# Patient Record
Sex: Male | Born: 1945 | Race: White | Hispanic: No | Marital: Married | Smoking: Former smoker
Health system: Southern US, Community
[De-identification: ages and names within clinical notes are randomized; demographics above are authoritative.]

## PROBLEM LIST (undated history)

## (undated) DIAGNOSIS — E1142 Type 2 diabetes mellitus with diabetic polyneuropathy: Secondary | ICD-10-CM

## (undated) DIAGNOSIS — G4730 Sleep apnea, unspecified: Secondary | ICD-10-CM

## (undated) DIAGNOSIS — I639 Cerebral infarction, unspecified: Secondary | ICD-10-CM

## (undated) DIAGNOSIS — I6930 Unspecified sequelae of cerebral infarction: Secondary | ICD-10-CM

## (undated) DIAGNOSIS — E1165 Type 2 diabetes mellitus with hyperglycemia: Secondary | ICD-10-CM

## (undated) DIAGNOSIS — I10 Essential (primary) hypertension: Secondary | ICD-10-CM

## (undated) HISTORY — PX: COLONOSCOPY: SHX174

## (undated) HISTORY — DX: Essential (primary) hypertension: I10

## (undated) HISTORY — PX: HEEL SPUR SURGERY: SHX665

## (undated) HISTORY — PX: HERNIA REPAIR: SHX51

## (undated) HISTORY — PX: ANKLE SURGERY: SHX546

## (undated) HISTORY — DX: Sleep apnea, unspecified: G47.30

## (undated) HISTORY — PX: OTHER SURGICAL HISTORY: SHX169

---

## 1997-07-19 ENCOUNTER — Encounter: Payer: Self-pay | Admitting: Pulmonary Disease

## 1997-09-21 ENCOUNTER — Encounter: Payer: Self-pay | Admitting: Pulmonary Disease

## 1999-05-06 ENCOUNTER — Other Ambulatory Visit: Admission: RE | Admit: 1999-05-06 | Discharge: 1999-05-06 | Payer: Self-pay | Admitting: Internal Medicine

## 1999-05-06 ENCOUNTER — Encounter (INDEPENDENT_AMBULATORY_CARE_PROVIDER_SITE_OTHER): Payer: Self-pay | Admitting: Specialist

## 2000-12-03 ENCOUNTER — Encounter (INDEPENDENT_AMBULATORY_CARE_PROVIDER_SITE_OTHER): Payer: Self-pay | Admitting: Specialist

## 2000-12-03 ENCOUNTER — Ambulatory Visit (HOSPITAL_BASED_OUTPATIENT_CLINIC_OR_DEPARTMENT_OTHER): Admission: RE | Admit: 2000-12-03 | Discharge: 2000-12-03 | Payer: Self-pay | Admitting: Surgery

## 2001-11-29 ENCOUNTER — Encounter: Payer: Self-pay | Admitting: Pulmonary Disease

## 2001-11-29 ENCOUNTER — Ambulatory Visit (HOSPITAL_BASED_OUTPATIENT_CLINIC_OR_DEPARTMENT_OTHER): Admission: RE | Admit: 2001-11-29 | Discharge: 2001-11-29 | Payer: Self-pay | Admitting: Pulmonary Disease

## 2003-08-20 ENCOUNTER — Encounter: Admission: RE | Admit: 2003-08-20 | Discharge: 2003-11-18 | Payer: Self-pay | Admitting: Family Medicine

## 2004-01-17 ENCOUNTER — Encounter: Admission: RE | Admit: 2004-01-17 | Discharge: 2004-04-16 | Payer: Self-pay | Admitting: Family Medicine

## 2004-08-28 ENCOUNTER — Ambulatory Visit: Payer: Self-pay | Admitting: Family Medicine

## 2004-09-08 ENCOUNTER — Ambulatory Visit: Payer: Self-pay | Admitting: Family Medicine

## 2005-02-27 ENCOUNTER — Ambulatory Visit: Payer: Self-pay | Admitting: Family Medicine

## 2005-03-10 ENCOUNTER — Ambulatory Visit: Payer: Self-pay | Admitting: Family Medicine

## 2005-09-18 ENCOUNTER — Ambulatory Visit: Payer: Self-pay | Admitting: Family Medicine

## 2005-09-23 ENCOUNTER — Ambulatory Visit: Payer: Self-pay | Admitting: Family Medicine

## 2005-10-08 ENCOUNTER — Ambulatory Visit: Payer: Self-pay | Admitting: Family Medicine

## 2005-10-27 ENCOUNTER — Ambulatory Visit: Payer: Self-pay | Admitting: Family Medicine

## 2005-11-10 ENCOUNTER — Ambulatory Visit: Payer: Self-pay | Admitting: Family Medicine

## 2005-11-24 ENCOUNTER — Ambulatory Visit: Payer: Self-pay | Admitting: Family Medicine

## 2005-12-15 ENCOUNTER — Ambulatory Visit: Payer: Self-pay | Admitting: Family Medicine

## 2006-03-02 ENCOUNTER — Ambulatory Visit: Payer: Self-pay | Admitting: Family Medicine

## 2006-08-05 ENCOUNTER — Ambulatory Visit: Payer: Self-pay | Admitting: Pulmonary Disease

## 2006-08-16 ENCOUNTER — Ambulatory Visit: Payer: Self-pay | Admitting: Family Medicine

## 2006-10-05 ENCOUNTER — Ambulatory Visit: Payer: Self-pay | Admitting: Family Medicine

## 2006-10-05 LAB — CONVERTED CEMR LAB
ALT: 23 U/L
AST: 21 U/L
Albumin: 3.6 g/dL
Alkaline Phosphatase: 48 U/L
BUN: 11 mg/dL
Basophils Absolute: 0 10*3/uL
Basophils Relative: 0.5 %
CO2: 26 meq/L
Calcium: 8.9 mg/dL
Chloride: 105 meq/L
Chol/HDL Ratio, serum: 4.7
Cholesterol: 116 mg/dL
Creatinine, Ser: 1 mg/dL
Creatinine,U: 182.8 mg/dL
Eosinophil percent: 2.5 %
GFR calc non Af Amer: 81 mL/min
Glomerular Filtration Rate, Af Am: 98 mL/min/{1.73_m2}
Glucose, Bld: 141 mg/dL — ABNORMAL HIGH
HCT: 43 %
HDL: 24.6 mg/dL — ABNORMAL LOW
Hemoglobin: 14.8 g/dL
Hgb A1c MFr Bld: 7 % — ABNORMAL HIGH
LDL DIRECT: 55.6 mg/dL
Lymphocytes Relative: 23.6 %
MCHC: 34.5 g/dL
MCV: 88 fL
Microalb Creat Ratio: 4.4 mg/g
Microalb, Ur: 0.8 mg/dL
Monocytes Absolute: 0.6 10*3/uL
Monocytes Relative: 7.7 %
Neutro Abs: 5.6 10*3/uL
Neutrophils Relative %: 65.7 %
PSA: 0.64 ng/mL
Platelets: 218 10*3/uL
Potassium: 3.8 meq/L
RBC: 4.89 M/uL
RDW: 12.1 %
Sodium: 137 meq/L
TSH: 1.02 u[IU]/mL
Total Bilirubin: 0.8 mg/dL
Total Protein: 6.8 g/dL
Triglyceride fasting, serum: 246 mg/dL
VLDL: 49 mg/dL — ABNORMAL HIGH
WBC: 8.4 10*3/uL

## 2006-10-12 ENCOUNTER — Ambulatory Visit: Payer: Self-pay | Admitting: Family Medicine

## 2006-12-02 ENCOUNTER — Ambulatory Visit (HOSPITAL_BASED_OUTPATIENT_CLINIC_OR_DEPARTMENT_OTHER): Admission: RE | Admit: 2006-12-02 | Discharge: 2006-12-02 | Payer: Self-pay | Admitting: Surgery

## 2007-02-08 ENCOUNTER — Ambulatory Visit: Payer: Self-pay | Admitting: Family Medicine

## 2007-02-08 LAB — CONVERTED CEMR LAB
Cholesterol: 127 mg/dL (ref 0–200)
Direct LDL: 59.5 mg/dL
Hgb A1c MFr Bld: 6.1 % — ABNORMAL HIGH (ref 4.6–6.0)
VLDL: 76 mg/dL — ABNORMAL HIGH (ref 0–40)

## 2007-06-15 DIAGNOSIS — I10 Essential (primary) hypertension: Secondary | ICD-10-CM

## 2007-06-15 DIAGNOSIS — E139 Other specified diabetes mellitus without complications: Secondary | ICD-10-CM | POA: Insufficient documentation

## 2007-07-27 ENCOUNTER — Ambulatory Visit: Payer: Self-pay | Admitting: Pulmonary Disease

## 2007-07-27 DIAGNOSIS — G4733 Obstructive sleep apnea (adult) (pediatric): Secondary | ICD-10-CM

## 2007-08-31 ENCOUNTER — Ambulatory Visit: Payer: Self-pay | Admitting: Family Medicine

## 2007-10-18 ENCOUNTER — Ambulatory Visit: Payer: Self-pay | Admitting: Family Medicine

## 2007-10-18 LAB — CONVERTED CEMR LAB
Albumin: 3.9 g/dL (ref 3.5–5.2)
Basophils Absolute: 0.1 10*3/uL (ref 0.0–0.1)
Creatinine, Ser: 1 mg/dL (ref 0.4–1.5)
Creatinine,U: 253 mg/dL
Eosinophils Absolute: 0.2 10*3/uL (ref 0.0–0.6)
GFR calc non Af Amer: 80 mL/min
Glucose, Urine, Semiquant: NEGATIVE
HCT: 44.3 % (ref 39.0–52.0)
Hemoglobin: 15.1 g/dL (ref 13.0–17.0)
Lymphocytes Relative: 28.2 % (ref 12.0–46.0)
MCHC: 34.1 g/dL (ref 30.0–36.0)
MCV: 89.7 fL (ref 78.0–100.0)
Microalb, Ur: 0.8 mg/dL (ref 0.0–1.9)
Monocytes Absolute: 0.8 10*3/uL — ABNORMAL HIGH (ref 0.2–0.7)
Neutrophils Relative %: 58.1 % (ref 43.0–77.0)
PSA: 0.39 ng/mL (ref 0.10–4.00)
Potassium: 4.6 meq/L (ref 3.5–5.1)
Sodium: 138 meq/L (ref 135–145)
Specific Gravity, Urine: >=1.03
Total Bilirubin: 0.8 mg/dL (ref 0.3–1.2)
WBC Urine, dipstick: NEGATIVE
pH: 5

## 2007-10-25 ENCOUNTER — Ambulatory Visit: Payer: Self-pay | Admitting: Family Medicine

## 2007-10-26 ENCOUNTER — Encounter: Payer: Self-pay | Admitting: Family Medicine

## 2007-12-13 ENCOUNTER — Ambulatory Visit: Payer: Self-pay | Admitting: Family Medicine

## 2008-02-10 ENCOUNTER — Ambulatory Visit: Payer: Self-pay | Admitting: Family Medicine

## 2008-02-10 DIAGNOSIS — J069 Acute upper respiratory infection, unspecified: Secondary | ICD-10-CM | POA: Insufficient documentation

## 2008-03-15 ENCOUNTER — Ambulatory Visit: Payer: Self-pay | Admitting: Family Medicine

## 2008-03-15 DIAGNOSIS — E785 Hyperlipidemia, unspecified: Secondary | ICD-10-CM

## 2008-03-15 LAB — CONVERTED CEMR LAB
CO2: 26 meq/L (ref 19–32)
Calcium: 9.3 mg/dL (ref 8.4–10.5)
Direct LDL: 66.1 mg/dL
GFR calc Af Amer: 79 mL/min
GFR calc non Af Amer: 65 mL/min
Hgb A1c MFr Bld: 7.6 % — ABNORMAL HIGH (ref 4.6–6.0)
Sodium: 134 meq/L — ABNORMAL LOW (ref 135–145)
Total CHOL/HDL Ratio: 8.7
Triglycerides: 231 mg/dL (ref 0–149)
VLDL: 46 mg/dL — ABNORMAL HIGH (ref 0–40)

## 2008-03-16 ENCOUNTER — Ambulatory Visit: Payer: Self-pay | Admitting: Family Medicine

## 2008-04-02 ENCOUNTER — Telehealth: Payer: Self-pay | Admitting: Family Medicine

## 2008-07-17 ENCOUNTER — Telehealth: Payer: Self-pay | Admitting: Family Medicine

## 2008-07-23 ENCOUNTER — Ambulatory Visit: Payer: Self-pay | Admitting: Family Medicine

## 2008-07-23 LAB — CONVERTED CEMR LAB
Calcium: 8.8 mg/dL (ref 8.4–10.5)
Chloride: 109 meq/L (ref 96–112)
Creatinine, Ser: 1 mg/dL (ref 0.4–1.5)
GFR calc Af Amer: 97 mL/min
GFR calc non Af Amer: 80 mL/min
Hgb A1c MFr Bld: 6.6 % — ABNORMAL HIGH (ref 4.6–6.0)

## 2008-07-25 ENCOUNTER — Ambulatory Visit: Payer: Self-pay | Admitting: Pulmonary Disease

## 2008-08-02 ENCOUNTER — Ambulatory Visit: Payer: Self-pay | Admitting: Family Medicine

## 2008-11-01 ENCOUNTER — Ambulatory Visit: Payer: Self-pay | Admitting: Family Medicine

## 2008-11-01 LAB — CONVERTED CEMR LAB
ALT: 31 units/L (ref 0–53)
AST: 24 units/L (ref 0–37)
Alkaline Phosphatase: 39 units/L (ref 39–117)
BUN: 12 mg/dL (ref 6–23)
Basophils Relative: 0.3 % (ref 0.0–3.0)
Chloride: 106 meq/L (ref 96–112)
Creatinine, Ser: 1 mg/dL (ref 0.4–1.5)
Direct LDL: 52.8 mg/dL
Eosinophils Absolute: 0.2 10*3/uL (ref 0.0–0.7)
Eosinophils Relative: 3.9 % (ref 0.0–5.0)
GFR calc non Af Amer: 80 mL/min
Glucose, Bld: 144 mg/dL — ABNORMAL HIGH (ref 70–99)
HCT: 41.5 % (ref 39.0–52.0)
MCV: 88.6 fL (ref 78.0–100.0)
Microalb Creat Ratio: 3.2 mg/g (ref 0.0–30.0)
Microalb, Ur: 0.6 mg/dL (ref 0.0–1.9)
Monocytes Absolute: 0.6 10*3/uL (ref 0.1–1.0)
Monocytes Relative: 9 % (ref 3.0–12.0)
Neutrophils Relative %: 60.7 % (ref 43.0–77.0)
Nitrite: NEGATIVE
PSA: 0.34 ng/mL (ref 0.10–4.00)
RBC: 4.69 M/uL (ref 4.22–5.81)
Specific Gravity, Urine: >=1.03
Total CHOL/HDL Ratio: 6.9
Urobilinogen, UA: 0.2
WBC Urine, dipstick: NEGATIVE
WBC: 6.4 10*3/uL (ref 4.5–10.5)

## 2008-11-06 ENCOUNTER — Ambulatory Visit: Payer: Self-pay | Admitting: Family Medicine

## 2008-11-08 ENCOUNTER — Telehealth: Payer: Self-pay | Admitting: Family Medicine

## 2008-12-07 ENCOUNTER — Ambulatory Visit: Payer: Self-pay | Admitting: Internal Medicine

## 2008-12-21 ENCOUNTER — Encounter: Payer: Self-pay | Admitting: Internal Medicine

## 2008-12-21 ENCOUNTER — Ambulatory Visit: Payer: Self-pay | Admitting: Internal Medicine

## 2008-12-24 ENCOUNTER — Encounter: Payer: Self-pay | Admitting: Internal Medicine

## 2009-01-14 ENCOUNTER — Telehealth: Payer: Self-pay | Admitting: *Deleted

## 2009-02-11 ENCOUNTER — Ambulatory Visit: Payer: Self-pay | Admitting: Family Medicine

## 2009-02-11 LAB — CONVERTED CEMR LAB
Hgb A1c MFr Bld: 7.6 % — ABNORMAL HIGH (ref 4.6–6.5)
Total CHOL/HDL Ratio: 8

## 2009-02-18 ENCOUNTER — Ambulatory Visit: Payer: Self-pay | Admitting: Family Medicine

## 2009-07-15 ENCOUNTER — Ambulatory Visit: Payer: Self-pay | Admitting: Family Medicine

## 2009-07-15 LAB — CONVERTED CEMR LAB
Chloride: 111 meq/L (ref 96–112)
Hgb A1c MFr Bld: 5.7 % (ref 4.6–6.5)
Potassium: 3.8 meq/L (ref 3.5–5.1)

## 2009-07-22 ENCOUNTER — Ambulatory Visit: Payer: Self-pay | Admitting: Family Medicine

## 2009-07-23 ENCOUNTER — Telehealth (INDEPENDENT_AMBULATORY_CARE_PROVIDER_SITE_OTHER): Payer: Self-pay | Admitting: *Deleted

## 2009-07-25 ENCOUNTER — Ambulatory Visit: Payer: Self-pay | Admitting: Pulmonary Disease

## 2009-11-07 ENCOUNTER — Ambulatory Visit: Payer: Self-pay | Admitting: Family Medicine

## 2009-11-07 LAB — CONVERTED CEMR LAB
ALT: 21 units/L (ref 0–53)
Albumin: 3.7 g/dL (ref 3.5–5.2)
Alkaline Phosphatase: 26 units/L — ABNORMAL LOW (ref 39–117)
Basophils Absolute: 0 10*3/uL (ref 0.0–0.1)
Blood in Urine, dipstick: NEGATIVE
CO2: 26 meq/L (ref 19–32)
Calcium: 9.1 mg/dL (ref 8.4–10.5)
Creatinine,U: 176.4 mg/dL
Eosinophils Absolute: 0.2 10*3/uL (ref 0.0–0.7)
Glucose, Bld: 106 mg/dL — ABNORMAL HIGH (ref 70–99)
Glucose, Urine, Semiquant: NEGATIVE
HDL: 20.6 mg/dL — ABNORMAL LOW (ref >39.00)
Hemoglobin: 13.7 g/dL (ref 13.0–17.0)
Hgb A1c MFr Bld: 5.7 % (ref 4.6–6.5)
Ketones, urine, test strip: NEGATIVE
Lymphocytes Relative: 26.4 % (ref 12.0–46.0)
Lymphs Abs: 1.5 10*3/uL (ref 0.7–4.0)
MCHC: 33.6 g/dL (ref 30.0–36.0)
MCV: 94.2 fL (ref 78.0–100.0)
Microalb, Ur: 0.5 mg/dL (ref 0.0–1.9)
Monocytes Absolute: 0.5 10*3/uL (ref 0.1–1.0)
Neutro Abs: 3.5 10*3/uL (ref 1.4–7.7)
RDW: 13.3 % (ref 11.5–14.6)
Sodium: 136 meq/L (ref 135–145)
Specific Gravity, Urine: 1.025
TSH: 1.51 microintl units/mL (ref 0.35–5.50)
Triglycerides: 134 mg/dL (ref 0.0–149.0)
WBC Urine, dipstick: NEGATIVE
pH: 5

## 2009-11-14 ENCOUNTER — Ambulatory Visit: Payer: Self-pay | Admitting: Family Medicine

## 2009-11-14 ENCOUNTER — Encounter: Payer: Self-pay | Admitting: Pulmonary Disease

## 2009-12-02 ENCOUNTER — Encounter: Payer: Self-pay | Admitting: Family Medicine

## 2010-04-07 ENCOUNTER — Ambulatory Visit: Payer: Self-pay | Admitting: Family Medicine

## 2010-04-07 ENCOUNTER — Telehealth: Payer: Self-pay | Admitting: Family Medicine

## 2010-04-07 DIAGNOSIS — J45901 Unspecified asthma with (acute) exacerbation: Secondary | ICD-10-CM | POA: Insufficient documentation

## 2010-04-10 ENCOUNTER — Ambulatory Visit: Payer: Self-pay | Admitting: Family Medicine

## 2010-05-07 ENCOUNTER — Ambulatory Visit: Payer: Self-pay | Admitting: Family Medicine

## 2010-05-13 LAB — CONVERTED CEMR LAB
BUN: 18 mg/dL (ref 6–23)
CO2: 26 meq/L (ref 19–32)
Calcium: 9.1 mg/dL (ref 8.4–10.5)
Creatinine, Ser: 1.2 mg/dL (ref 0.4–1.5)
GFR calc non Af Amer: 67.11 mL/min (ref >60)
Glucose, Bld: 108 mg/dL — ABNORMAL HIGH (ref 70–99)

## 2010-07-29 ENCOUNTER — Ambulatory Visit: Payer: Self-pay | Admitting: Pulmonary Disease

## 2010-08-07 ENCOUNTER — Encounter: Admission: RE | Admit: 2010-08-07 | Discharge: 2010-08-07 | Payer: Self-pay | Admitting: Sports Medicine

## 2010-09-11 ENCOUNTER — Telehealth (INDEPENDENT_AMBULATORY_CARE_PROVIDER_SITE_OTHER): Payer: Self-pay | Admitting: *Deleted

## 2010-09-23 ENCOUNTER — Telehealth (INDEPENDENT_AMBULATORY_CARE_PROVIDER_SITE_OTHER): Payer: Self-pay | Admitting: *Deleted

## 2010-10-31 ENCOUNTER — Encounter: Payer: Self-pay | Admitting: Pulmonary Disease

## 2010-11-10 ENCOUNTER — Other Ambulatory Visit: Payer: Self-pay | Admitting: Family Medicine

## 2010-11-10 ENCOUNTER — Ambulatory Visit
Admission: RE | Admit: 2010-11-10 | Discharge: 2010-11-10 | Payer: Self-pay | Source: Home / Self Care | Attending: Family Medicine | Admitting: Family Medicine

## 2010-11-10 LAB — BASIC METABOLIC PANEL
BUN: 20 mg/dL (ref 6–23)
CO2: 25 mEq/L (ref 19–32)
Calcium: 9.2 mg/dL (ref 8.4–10.5)
Chloride: 102 mEq/L (ref 96–112)
Creatinine, Ser: 1.2 mg/dL (ref 0.4–1.5)
GFR: 64.44 mL/min (ref >60.00)
Glucose, Bld: 76 mg/dL (ref 70–99)
Potassium: 4.2 mEq/L (ref 3.5–5.1)
Sodium: 134 mEq/L — ABNORMAL LOW (ref 135–145)

## 2010-11-10 LAB — CBC WITH DIFFERENTIAL/PLATELET
Basophils Absolute: 0 10*3/uL (ref 0.0–0.1)
Basophils Relative: 0.3 % (ref 0.0–3.0)
Eosinophils Absolute: 0.3 10*3/uL (ref 0.0–0.7)
Eosinophils Relative: 3.8 % (ref 0.0–5.0)
HCT: 43 % (ref 39.0–52.0)
Hemoglobin: 14.6 g/dL (ref 13.0–17.0)
Lymphocytes Relative: 26.2 % (ref 12.0–46.0)
Lymphs Abs: 1.9 10*3/uL (ref 0.7–4.0)
MCHC: 33.9 g/dL (ref 30.0–36.0)
MCV: 91.7 fl (ref 78.0–100.0)
Monocytes Absolute: 0.8 10*3/uL (ref 0.1–1.0)
Monocytes Relative: 10.3 % (ref 3.0–12.0)
Neutro Abs: 4.4 10*3/uL (ref 1.4–7.7)
Neutrophils Relative %: 59.4 % (ref 43.0–77.0)
Platelets: 181 10*3/uL (ref 150.0–400.0)
RBC: 4.69 Mil/uL (ref 4.22–5.81)
RDW: 14.3 % (ref 11.5–14.6)
WBC: 7.4 10*3/uL (ref 4.5–10.5)

## 2010-11-10 LAB — URINALYSIS
Bilirubin Urine: NEGATIVE
Hemoglobin, Urine: NEGATIVE
Ketones, ur: NEGATIVE
Leukocytes, UA: NEGATIVE
Nitrite: NEGATIVE
Specific Gravity, Urine: 1.025 (ref 1.000–1.030)
Total Protein, Urine: NEGATIVE
Urine Glucose: NEGATIVE
Urobilinogen, UA: 0.2 (ref 0.0–1.0)
pH: 5 (ref 5.0–8.0)

## 2010-11-10 LAB — LIPID PANEL
Cholesterol: 146 mg/dL (ref 0–200)
HDL: 22.8 mg/dL — ABNORMAL LOW (ref >39.00)
LDL Cholesterol: 97 mg/dL (ref 0–99)
Total CHOL/HDL Ratio: 6
Triglycerides: 130 mg/dL (ref 0.0–149.0)
VLDL: 26 mg/dL (ref 0.0–40.0)

## 2010-11-10 LAB — HEPATIC FUNCTION PANEL
ALT: 23 U/L (ref 0–53)
AST: 25 U/L (ref 0–37)
Albumin: 3.8 g/dL (ref 3.5–5.2)
Alkaline Phosphatase: 30 U/L — ABNORMAL LOW (ref 39–117)
Bilirubin, Direct: 0.2 mg/dL (ref 0.0–0.3)
Total Bilirubin: 0.9 mg/dL (ref 0.3–1.2)
Total Protein: 6.6 g/dL (ref 6.0–8.3)

## 2010-11-10 LAB — TSH: TSH: 1.31 u[IU]/mL (ref 0.35–5.50)

## 2010-11-10 LAB — PSA: PSA: 0.48 ng/mL (ref 0.10–4.00)

## 2010-11-25 ENCOUNTER — Encounter: Payer: Self-pay | Admitting: Family Medicine

## 2010-11-25 ENCOUNTER — Ambulatory Visit
Admission: RE | Admit: 2010-11-25 | Discharge: 2010-11-25 | Payer: Self-pay | Source: Home / Self Care | Attending: Family Medicine | Admitting: Family Medicine

## 2010-11-25 DIAGNOSIS — E1149 Type 2 diabetes mellitus with other diabetic neurological complication: Secondary | ICD-10-CM | POA: Insufficient documentation

## 2010-11-25 NOTE — Progress Notes (Signed)
Summary: cpap supplies  Phone Note From Pharmacy   Caller: stephanie w/ [facility] Call For: clance  Summary of Call: caller is re-faxing request for cpap supplies- says she faxed this previously on 09/03/10. stephanie [phone] x [phone] Initial call taken by: Tivis Ringer, CNA,  September 11, 2010 4:47 PM  Follow-up for Phone Call        left msg for Judeth Cornfield we do have cn request and given to Dr Shelle Iron  for signature.  PT WAS SEEN 07/29/10 AND NOT DUE FOR NEXT OV UNTIL OCTOBER 2012.  As soon as we get signed bu Dr Shelle Iron will fax to [facility] .Kandice Hams [facility]  September 12, 2010 9:15 AM  Follow-up by: Kandice Hams CMA,  September 12, 2010 9:15 AM

## 2010-11-25 NOTE — Letter (Signed)
Summary: LMN for CPAP Supplies/[facility]  LMN for CPAP Supplies/[facility]   Imported By: Sherian Rein 11/21/2009 08:14:32  _____________________________________________________________________  External Attachment:    Type:   Image     Comment:   External Document

## 2010-11-25 NOTE — Assessment & Plan Note (Signed)
Summary: rov for osa   Primary Melyssa Signor/Referring Adessa Primiano:  Tawanna Cooler  CC:  Annual followup on OSA.  Pt states that he is doing well with CPAP- sleeps well up to 6-7 hrs per night on average.  Denies any complaints regarding mask or pressure.[name]  History of Present Illness: the pt comes in today for f/u of his known osa.  He has been wearing cpap compliantly, and feels he is sleeping well with adequate daytime alertness.  He is having no issues with his mask or pressure tolerance.  His weight is up about 8 pounds since the last visit here.  Current Medications (verified): 1)  Hydrocortisone Valerate 0.2 % Crea (Hydrocortisone Valerate) .... Uad 2)  Onetouch Ultra Test  Strp (Glucose Blood) .... Uad 3)  Adult Aspirin Low Strength 81 Mg  Tbdp (Aspirin) .... Take 1 Tablet By Mouth Once A Day 4)  Glipizide Xl 10 Mg  Tb24 (Glipizide) .... Take 1 Tablet By Mouth Two Times A Day 5)  Fenofibrate Micronized 200 Mg Caps (Fenofibrate Micronized) .... Take One Tab Once Daily 6)  Actos 30 Mg Tabs (Pioglitazone Hcl) .... Take One Tab Once Daily 7)  Zestril 20 Mg Tabs (Lisinopril) .... Take 1 Tablet By Mouth Every Morning  Allergies (verified): 1)  ! Oxycodone-Acetaminophen (Oxycodone-Acetaminophen)  Past History:  Past medical, surgical, family and social histories (including risk factors) reviewed, and no changes noted (except as noted below).  Past Medical History: Reviewed history from 06/15/2007 and no changes required. Sleep Apnea Diabetes mellitus, type II Hypertension  Past Surgical History: Reviewed history from 06/15/2007 and no changes required. Surgery - sleep apnea Foot surgery  Family History: Reviewed history from 06/15/2007 and no changes required. Family History of Glaucoma Family History of Colon CA 1st degree relative <60 Family History Psychiatric care Family History of Cardiovascular disorder Family History of Respiratory disease  Social History: Reviewed history from  11/06/2008 and no changes required. Occupation: Owner Married Current Smoker Alcohol use-yes Regular exercise-no married but no sexual relations  Review of Systems       The patient complains of weight change and joint stiffness or pain.  The patient denies shortness of breath with activity, shortness of breath at rest, productive cough, non-productive cough, coughing up blood, chest pain, irregular heartbeats, acid heartburn, indigestion, loss of appetite, abdominal pain, difficulty swallowing, sore throat, tooth/dental problems, headaches, nasal congestion/difficulty breathing through nose, sneezing, itching, ear ache, anxiety, depression, hand/feet swelling, rash, change in color of mucus, and fever.    Vital Signs:  Patient profile:   65 year old male Weight:      269 pounds BMI:     38.74 O2 Sat:      94 % on Room air Temp:     98.4 degrees F oral Pulse rate:   77 / minute BP sitting:   136 / 82  (left arm) Cuff size:   large  Vitals Entered By: Vernie Murders (July 29, 2010 11:47 AM)  O2 Flow:  Room air  Physical Exam  General:  ow male in nad Nose:  no skin breakdown or pressure necrosis from cpap mask Extremities:  no edema or cyanosis Neurologic:  alert and oriented, does not appear sleepy.   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  He feels he is sleeping well, and is satisfied with his daytime alertness.  I have asked him to stay on cpap, and to work aggressively on weight loss.  He will followup with me in one year, or sooner if having problems  Other Orders: Est. Patient Level III (09811) Flu Vaccine 15yrs + MEDICARE PATIENTS (B1478) Administration Flu vaccine - MCR (G9562)  Patient Instructions: 1)  will give you the flu shot today 2)  continue with cpap, and work on weight loss 3)  followup with me in one  year.  Flu Vaccine Consent Questions     Do you have a history of severe allergic reactions to this vaccine? no    Any prior history of allergic reactions to egg and/or gelatin? no    Do you have a sensitivity to the preservative Thimersol? no    Do you have a past history of Guillan-Barre Syndrome? no    Do you currently have an acute febrile illness? no    Have you ever had a severe reaction to latex? no    Vaccine information given and explained to patient? yes    Are you currently pregnant? no    Lot Number:[id number]   Exp Date:04/25/2011   Manufacturer: [employer]    Site Given  Left Deltoid IMdflu Vernie Murders  July 29, 2010 12:12 PM

## 2010-11-25 NOTE — Assessment & Plan Note (Signed)
Summary: cpx./njr   Vital Signs:  Patient profile:   65 year old male Height:      70 inches Weight:      261 pounds Temp:     99.0 degrees F oral BP sitting:   114 / 84  (left arm) Cuff size:   regular  Vitals Entered By: Kern Reap CMA Duncan Dull) (November 14, 2009 9:36 AM)  Reason for Visit cpx   Primary Care Provider:  Tawanna Cooler   History of Present Illness: Darrell [name] is a 65 year old male, married, nonsmoker, who comes in today for evaluation of hypertension, diabetes, hyperlipidemia........... the classic metabolic syndrome.  Also, obesity, 70 inches tall, weight 261.  For diabetes.  He takes glipizide 10 mg b.i.d., Actos 30 mg daily, fasting blood sugar is around 110, hemoglobin A1c is 5.7.  For hypertension.  He takes Zestril 20 mg daily BP 114/84.  For hypertriglyceridemia.  He takes a triglyceride lowering medication 200 mg daily, lipids at goal.  He gets routine eye exams dental care.  Colonoscopy 2010 showed some polyps on a 5 year recall.  Tetanus 2006 seasonal flu 2010 Pneumovax 2009 shingles 2009.  He is not exercising on a regular basis, but he does take his medicine.  His daughter Andrey Campanile lives in [place] has persistent problems with MS is not resolved with the usual medications  Allergies: 1)  ! Oxycodone-Acetaminophen (Oxycodone-Acetaminophen)  Past History:  Past medical, surgical, family and social histories (including risk factors) reviewed, and no changes noted (except as noted below).  Past Medical History: Reviewed history from 06/15/2007 and no changes required. Sleep Apnea Diabetes mellitus, type II Hypertension  Past Surgical History: Reviewed history from 06/15/2007 and no changes required. Surgery - sleep apnea Foot surgery  Family History: Reviewed history from 06/15/2007 and no changes required. Family History of Glaucoma Family History of Colon CA 1st degree relative <60 Family History Psychiatric care Family History of Cardiovascular  disorder Family History of Respiratory disease  Social History: Reviewed history from 11/06/2008 and no changes required. Occupation: Network engineer Married Current Smoker Alcohol use-yes Regular exercise-no married but no sexual relations  Review of Systems      See HPI  Physical Exam  General:  Well-developed,well-nourished,in no acute distress; alert,appropriate and cooperative throughout examination Head:  Normocephalic and atraumatic without obvious abnormalities. No apparent alopecia or balding. Eyes:  No corneal or conjunctival inflammation noted. EOMI. Perrla. Funduscopic exam benign, without hemorrhages, exudates or papilledema. Vision grossly normal. Ears:  External ear exam shows no significant lesions or deformities.  Otoscopic examination reveals clear canals, tympanic membranes are intact bilaterally without bulging, retraction, inflammation or discharge. Hearing is grossly normal bilaterally. Nose:  External nasal examination shows no deformity or inflammation. Nasal mucosa are pink and moist without lesions or exudates. Mouth:  Oral mucosa and oropharynx without lesions or exudates.  Teeth in good repair. Neck:  No deformities, masses, or tenderness noted. Chest Wall:  No deformities, masses, tenderness or gynecomastia noted. Breasts:  No masses or gynecomastia noted Lungs:  Normal respiratory effort, chest expands symmetrically. Lungs are clear to auscultation, no crackles or wheezes. Heart:  Normal rate and regular rhythm. S1 and S2 normal without gallop, murmur, click, rub or other extra sounds. Abdomen:  Bowel sounds positive,abdomen soft and non-tender without masses, organomegaly or hernias noted. Rectal:  No external abnormalities noted. Normal sphincter tone. No rectal masses or tenderness. Genitalia:  Testes bilaterally descended without nodularity, tenderness or masses. No scrotal masses or lesions. No penis lesions or urethral discharge.  Prostate:  Prostate gland firm  and smooth, no enlargement, nodularity, tenderness, mass, asymmetry or induration. Msk:  No deformity or scoliosis noted of thoracic or lumbar spine.   Pulses:  R and L carotid,radial,femoral,dorsalis pedis and posterior tibial pulses are full and equal bilaterally Extremities:  No clubbing, cyanosis, edema, or deformity noted with normal full range of motion of all joints.   Neurologic:  No cranial nerve deficits noted. Station and gait are normal. Plantar reflexes are down-going bilaterally. DTRs are symmetrical throughout. Sensory, motor and coordinative functions appear intact. Skin:   total body skin exam is normal except for a red lesion, mid upper nose.  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  Referred back to dermatology Cervical Nodes:  No lymphadenopathy noted Axillary Nodes:  No palpable lymphadenopathy Inguinal Nodes:  No significant adenopathy Psych:  Cognition and judgment appear intact. Alert and cooperative with normal attention span and concentration. No apparent delusions, illusions, hallucinations  Diabetes Management Exam:    Foot Exam (with socks and/or shoes not present):       Sensory-Pinprick/Light touch:          Left medial foot (L-4): normal          Left dorsal foot (L-5): normal          Left lateral foot (S-1): normal          Right medial foot (L-4): normal          Right dorsal foot (L-5): normal          Right lateral foot (S-1): normal       Sensory-Monofilament:          Left foot: normal          Right foot: normal       Inspection:          Left foot: normal          Right foot: normal       Nails:          Left foot: normal          Right foot: normal    Eye Exam:       Eye Exam done elsewhere          Date: 12/10/2008          Results: normal          Done by: Dione Booze   Impression & Recommendations:  Problem # 1:  HYPERLIPIDEMIA (ICD-272.4) Assessment Improved  His updated medication list for this problem  includes:    Fenofibrate Micronized 200 Mg Caps (Fenofibrate micronized) .[name]... Take one tab once daily  Orders: Prescription Created Electronically [id number]) EKG w/ Interpretation (93000)  Problem # 2:  HYPERTENSION (ICD-401.9) Assessment: Improved  The following medications were removed from the medication list:    Zestril 10 Mg Tabs (Lisinopril) .[name]... Take 1 tablet by mouth once a day His updated medication list for this problem includes:    Zestril 20 Mg Tabs (Lisinopril) .[name]... Take 1 tablet by mouth every morning  Orders: Prescription Created Electronically [phone]) EKG w/ Interpretation (93000)  Problem # 3:  DIABETES MELLITUS, TYPE II (ICD-250.00) Assessment: Improved  The following medications were removed from the medication list:    Zestril 10 Mg Tabs (Lisinopril) .[name]... Take 1 tablet by mouth once a day His updated medication list for this problem includes:    Adult Aspirin Low Strength 81 Mg Tbdp (Aspirin) .[name]... Take 1 tablet  by mouth once a day    Glipizide Xl 10 Mg Tb24 (Glipizide) .[name]... Take 1 tablet by mouth two times a day    Actos 30 Mg Tabs (Pioglitazone hcl) .[name]... Take one tab once daily    Zestril 20 Mg Tabs (Lisinopril) .[name]... Take 1 tablet by mouth every morning  Orders: Prescription Created Electronically [id number])  Complete Medication List: 1)  Hydrocortisone Valerate 0.2 % Crea (Hydrocortisone valerate) .... Uad 2)  Onetouch Ultra Test Strp (Glucose blood) .... Uad 3)  Adult Aspirin Low Strength 81 Mg Tbdp (Aspirin) .... Take 1 tablet by mouth once a day 4)  Glipizide Xl 10 Mg Tb24 (Glipizide) .... Take 1 tablet by mouth two times a day 5)  Fenofibrate Micronized 200 Mg Caps (Fenofibrate micronized) .... Take one tab once daily 6)  Actos 30 Mg Tabs (Pioglitazone hcl) .... Take one tab once daily 7)  Zestril 20 Mg Tabs (Lisinopril) .... Take 1 tablet by mouth every morning  Patient Instructions: 1)  Please schedule a follow-up appointment in 6 months. 2)   It is important that you exercise regularly at least 20 minutes 5 times a week. If you develop chest pain, have severe difficulty breathing, or feel very tired , stop exercising immediately and seek medical attention. 3)  Check your blood sugars regularly. If your readings are usually above : or below 70 you should contact our office. 4)  It is important that your Diabetic A1c level is checked every 3 months. 5)  See your eye doctor yearly to check for diabetic eye damage. 6)  Check your feet each night for sore areas, calluses or signs of infection. 7)  Check your Blood Pressure regularly. If it is above: you should make an appointment. 8)  BMP prior to visit, ICD-9:.......250.00 9)  HbgA1C prior to visit, ICD-9: Prescriptions: ZESTRIL 20 MG TABS (LISINOPRIL) Take 1 tablet by mouth every morning  #100 x 3   Entered and Authorized by:   Roderick Pee MD   Signed by:   Roderick Pee MD on 11/14/2009   Method used:   Electronically to        [facility] (retail)       [address]       [place], [place]  [address]       Ph: [phone] or [phone]       Fax: [phone]   RxID:   [id number] ACTOS 30 MG TABS (PIOGLITAZONE HCL) take one tab once daily  #100 x 3   Entered and Authorized by:   Roderick Pee MD   Signed by:   Roderick Pee MD on 11/14/2009   Method used:   Electronically to        [facility] (retail)       [address]       [place], [place]  [address]       Ph: [phone] or [phone]       Fax: [phone]   RxID:   [id number] FENOFIBRATE MICRONIZED 200 MG CAPS (FENOFIBRATE MICRONIZED) take one tab once daily  #100 x 3   Entered and Authorized by:   Roderick Pee MD   Signed by:   Roderick Pee MD on 11/14/2009   Method used:   Electronically to        [facility] [phone]* (retail)  [address]       [place], [place]  [address]       Ph:  [phone] or [phone]       Fax: [phone]   RxID:   [id number] GLIPIZIDE XL 10 MG  TB24 (GLIPIZIDE) Take 1 tablet by mouth two times a day  #200 x 3   Entered and Authorized by:   Roderick Pee MD   Signed by:   Roderick Pee MD on 11/14/2009   Method used:   Electronically to        [facility] (retail)       [address]       [place], [place]  [address]       Ph: [phone] or [phone]       Fax: [phone]   RxID:   [id number] [place] TEST  STRP (GLUCOSE BLOOD) uad  #100 x 2   Entered and Authorized by:   Roderick Pee MD   Signed by:   Roderick Pee MD on 11/14/2009   Method used:   Electronically to        [facility] (retail)       [address]       [place], [place]  [address]       Ph: [phone] or [phone]       Fax: [phone]   RxID:   [id number] HYDROCORTISONE VALERATE 0.2 % CREA (HYDROCORTISONE VALERATE) uad  #60 gr x 2   Entered and Authorized by:   Roderick Pee MD   Signed by:   Roderick Pee MD on 11/14/2009   Method used:   Electronically to        [facility] (retail)       [address]       [place], [place]  [address]       Ph: [phone] or [phone]       Fax: [phone]   RxID:   [id number]    Immunization History:  Influenza Immunization History:    Influenza:  historical (07/26/2009)

## 2010-11-25 NOTE — Assessment & Plan Note (Signed)
Summary: 3 day rov/njr   Vital Signs:  Patient profile:   65 year old male Temp:     98.1 degrees F oral BP sitting:   120 / 80  (left arm)  Vitals Entered By: Kern Reap CMA Duncan Dull) (April 10, 2010 10:28 AM) CC: 3 day follow up   Primary Care Provider:  Tawanna Cooler  CC:  3 day follow up.  History of Present Illness: Darrell [name]  is a 65 year old male nonsmoker who comes in today for follow-up of asthma.  He was seen 4 days ago with severe asthma.  Chest x-ray showed no pneumonia.  He was started on prednisone 40 mg daily x 3 days.  He states he feels much better.  Has not had to use the cough syrup.  No major side effects from the prednisone, except for some minor elevation of his blood sugar.  Allergies: 1)  ! Oxycodone-Acetaminophen (Oxycodone-Acetaminophen)  Past History:  Past medical, surgical, family and social histories (including risk factors) reviewed for relevance to current acute and chronic problems.  Past Medical History: Reviewed history from 06/15/2007 and no changes required. Sleep Apnea Diabetes mellitus, type II Hypertension  Past Surgical History: Reviewed history from 06/15/2007 and no changes required. Surgery - sleep apnea Foot surgery  Family History: Reviewed history from 06/15/2007 and no changes required. Family History of Glaucoma Family History of Colon CA 1st degree relative <60 Family History Psychiatric care Family History of Cardiovascular disorder Family History of Respiratory disease  Social History: Reviewed history from 11/06/2008 and no changes required. Occupation: Network engineer Married Current Smoker Alcohol use-yes Regular exercise-no married but no sexual relations  Review of Systems      See HPI  Physical Exam  General:  Well-developed,well-nourished,in no acute distress; alert,appropriate and cooperative throughout examination Lungs:  increased and symmetrical.  Breath sounds.  Some late expiratory wheezing otherwise, lungs are  clear   Impression & Recommendations:  Problem # 1:  ASTHMA, ACUTE (ICD-493.92) Assessment Improved  His updated medication list for this problem includes:    Prednisone 20 Mg Tabs (Prednisone) ..... Uad  Complete Medication List: 1)  Hydrocortisone Valerate 0.2 % Crea (Hydrocortisone valerate) .... Uad 2)  Onetouch Ultra Test Strp (Glucose blood) .... Uad 3)  Adult Aspirin Low Strength 81 Mg Tbdp (Aspirin) .... Take 1 tablet by mouth once a day 4)  Glipizide Xl 10 Mg Tb24 (Glipizide) .... Take 1 tablet by mouth two times a day 5)  Fenofibrate Micronized 200 Mg Caps (Fenofibrate micronized) .... Take one tab once daily 6)  Actos 30 Mg Tabs (Pioglitazone hcl) .... Take one tab once daily 7)  Zestril 20 Mg Tabs (Lisinopril) .... Take 1 tablet by mouth every morning 8)  Prednisone 20 Mg Tabs (Prednisone) .... Uad 9)  Hydromet 5-1.5 Mg/6ml Syrp (Hydrocodone-homatropine) .[name].. 1 or 2 tsps three times a day as needed  Patient Instructions: 1)  take one prednisone tomorrow,,,,,,,,,, Saturday, and Sunday, take a half a tablet, then, starting next Monday take a half a tablet Monday, Wednesday, Friday, for a two week taper and stop

## 2010-11-25 NOTE — Assessment & Plan Note (Signed)
Summary: CONGESTION / LARYNGITIS // RS   Vital Signs:  Patient profile:   65 year old male Weight:      266 pounds Temp:     98.5 degrees F oral BP sitting:   110 / 74  (left arm) Cuff size:   large  Vitals Entered By: Kern Reap CMA Duncan Dull) (April 07, 2010 9:52 AM) CC: chest cough and laryngitis   Primary Care Provider:  Tawanna Cooler  CC:  chest cough and laryngitis.  History of Present Illness: Darrell [name] is a 65 year old, married male, nonsmoker, who comes in with a week's history of head congestion, postnasal drip voice loss and cough and shortness of breath.  He's had a history of allergic rhinitis in the past.  He's only had one asthma attack in his life.  Review of systems otherwise negative.  Specifically, no fever, etc.  Allergies: 1)  ! Oxycodone-Acetaminophen (Oxycodone-Acetaminophen)  Past History:  Past medical, surgical, family and social histories (including risk factors) reviewed for relevance to current acute and chronic problems.  Past Medical History: Reviewed history from 06/15/2007 and no changes required. Sleep Apnea Diabetes mellitus, type II Hypertension  Past Surgical History: Reviewed history from 06/15/2007 and no changes required. Surgery - sleep apnea Foot surgery  Family History: Reviewed history from 06/15/2007 and no changes required. Family History of Glaucoma Family History of Colon CA 1st degree relative <60 Family History Psychiatric care Family History of Cardiovascular disorder Family History of Respiratory disease  Social History: Reviewed history from 11/06/2008 and no changes required. Occupation: Network engineer Married Current Smoker Alcohol use-yes Regular exercise-no married but no sexual relations  Review of Systems      See HPI  Physical Exam  General:  Well-developed,well-nourished,in no acute distress; alert,appropriate and cooperative throughout examination Head:  Normocephalic and atraumatic without obvious  abnormalities. No apparent alopecia or balding. Eyes:  No corneal or conjunctival inflammation noted. EOMI. Perrla. Funduscopic exam benign, without hemorrhages, exudates or papilledema. Vision grossly normal. Ears:  External ear exam shows no significant lesions or deformities.  Otoscopic examination reveals clear canals, tympanic membranes are intact bilaterally without bulging, retraction, inflammation or discharge. Hearing is grossly normal bilaterally. Nose:  External nasal examination shows no deformity or inflammation. Nasal mucosa are pink and moist without lesions or exudates. Mouth:  Oral mucosa and oropharynx without lesions or exudates.  Teeth in good repair. Neck:  No deformities, masses, or tenderness noted. Chest Wall:  No deformities, masses, tenderness or gynecomastia noted. Lungs:  symmetrical breath sounds bilateral wheezing   Problems:  Medical Problems Added: 1)  Dx of Asthma, Acute  (ZOX-096.04)  Impression & Recommendations:  Problem # 1:  ASTHMA, ACUTE (VWU-981.19) Assessment New  Orders: T-2 View CXR (71020TC) Prescription Created Electronically [id number])  His updated medication list for this problem includes:    Prednisone 20 Mg Tabs (Prednisone) ..... Uad  Complete Medication List: 1)  Hydrocortisone Valerate 0.2 % Crea (Hydrocortisone valerate) .... Uad 2)  Onetouch Ultra Test Strp (Glucose blood) .... Uad 3)  Adult Aspirin Low Strength 81 Mg Tbdp (Aspirin) .... Take 1 tablet by mouth once a day 4)  Glipizide Xl 10 Mg Tb24 (Glipizide) .... Take 1 tablet by mouth two times a day 5)  Fenofibrate Micronized 200 Mg Caps (Fenofibrate micronized) .... Take one tab once daily 6)  Actos 30 Mg Tabs (Pioglitazone hcl) .... Take one tab once daily 7)  Zestril 20 Mg Tabs (Lisinopril) .... Take 1 tablet by mouth every morning 8)  Prednisone 20 Mg  Tabs (Prednisone) .... Uad 9)  Hydromet 5-1.5 Mg/69ml Syrp (Hydrocodone-homatropine) .[name].. 1 or 2 tsps three times a day as  needed  Patient Instructions: 1)  go to the main office now for a chest x-ray. 2)  Then begin prednisone two tabs x 3 days, one x 3 days, a half x 3 days, then half a tablet Monday, Wednesday, Friday, for a two week taper. 3)  Drink 30 ounces of water daily. 4)  One or two teaspoons of Hydromet at that time for nighttime cough. 5)  Return Thursday for follow-up. 6)  Check a fasting blood sugar daily and stay on a carbohydrate free diet Prescriptions: HYDROMET 5-1.5 MG/5ML SYRP (HYDROCODONE-HOMATROPINE) 1 or 2 tsps three times a day as needed  #8oz x 1   Entered and Authorized by:   Roderick Pee MD   Signed by:   Roderick Pee MD on 04/07/2010   Method used:   Print then Give to Patient   RxID:   [id number] PREDNISONE 20 MG TABS (PREDNISONE) UAD  #50 x 1   Entered and Authorized by:   Roderick Pee MD   Signed by:   Roderick Pee MD on 04/07/2010   Method used:   Electronically to        [facility]* (retail)       [address]       [place], [place]  [address]       Ph: [phone] or [phone]       Fax: [phone]   RxID:   [id number]

## 2010-11-25 NOTE — Progress Notes (Signed)
  Phone Note Outgoing Call   Summary of Call: I called her need to let him know that his chest x-ray showed no evidence of pneumonia Initial call taken by: Roderick Pee MD,  April 07, 2010 6:03 PM

## 2010-11-25 NOTE — Progress Notes (Signed)
Summary: cpap cmn   Phone Note From Other Clinic Call back at [phone]   Caller: Judeth Cornfield w/ [facility] Call For: Clance Summary of Call: calling about cmn for cpap supplies.  per the 11.17.11 phone note, this was given to [facility] to sign.  nothing scanned into EMR.  Alida, have you received this cmn back from [facility]? Initial call taken by: Boone Master CNA/MA,  September 23, 2010 1:00 PM  Follow-up for Phone Call        Spoke with Judeth Cornfield, informed her we are working on cmn, Dr Shelle Iron has signed a ton of them and this cmn will be faxed as soon as signed, Judeth Cornfield agreed .Kandice Hams [facility]  September 24, 2010 9:01 AM  Follow-up by: Kandice Hams CMA,  September 24, 2010 9:01 AM

## 2010-11-25 NOTE — Miscellaneous (Signed)
Summary: eye exam   Clinical Lists Changes  Observations: Added new observation of EYES COMMENT: 11/2010 (12/02/2009 16:26) Added new observation of EYE EXAM BY: groat (11/27/2009 16:26) Added new observation of DMEYEEXMRES: normal (11/27/2009 16:26) Added new observation of DIAB EYE EX: normal (11/27/2009 16:26)      Diabetes Management History:      He says that he is not exercising regularly.    Diabetes Management Exam:    Eye Exam:       Eye Exam done elsewhere          Date: 11/27/2009          Results: normal          Done by: Dione Booze

## 2010-11-27 NOTE — Letter (Signed)
Summary: CMN for CPAP Supplies / [facility]  CPAP Supplies / [facility]   Imported By: Lennie Odor 11/07/2010 11:21:33  _____________________________________________________________________  External Attachment:    Type:   Image     Comment:   External Document

## 2010-12-03 NOTE — Assessment & Plan Note (Signed)
Summary: CPX/CJR   Vital Signs:  Patient profile:   65 year old male Height:      70 inches Weight:      270 pounds Temp:     98.5 degrees F oral BP sitting:   120 / 78  (left arm) Cuff size:   regular  Vitals Entered By: Kern Reap CMA Duncan Dull) (November 25, 2010 3:02 PM) CC: welcome to medicare Is Patient Diabetic? Yes Did you bring your meter with you today? No Pain Assessment Patient in pain? no        Primary Care Provider:  Tawanna Cooler  CC:  welcome to medicare.  History of Present Illness: Darrell [name] is a 65 year old, married male, nonsmoker, who comes in today for Medicare wellness examination because of a history of diabetes on glipizide 10 mg b.i.d., Actos 30 mg daily.  Blood sugars in the 70 to 80 range.  He is having spells of hypoglycemia.  Last A1c7 months ago, was 6.3%he also takes a fiber 8 200 mg daily for hyperlipidemia, and Zestril 20 mg daily for blood pressure control, and kidney protection.  BP 120/78.  Review of systems negative except he still struggling his weight is now up to 270 was 261.  Last year.  He gets routine eye care.  May 2011 normal, dental care, colonoscopy every 3 to 5 years because of a history of colon polyps, tetanus, 2006, seasonal flu 2011, Pneumovax 2009, shingles 2000  Here for Medicare AWV:  1.   Risk factors based on Past M, S, F history:.......Marland Kitchenreviewed.  No changes except for above 2.   Physical Activities: not very physically active 3.   Depression/mood: good mood.  No depression 4.   Hearing: normal 5.   ADL's: functions independently continues to work 6.   Fall Risk: reviewed.  None identified 7.   Home Safety: no guns in the house 8.   Height, weight, &visual acuity:height weight, vision normal 9.   Counseling: continue good health habits begin a diet and exercise, weight loss program 10.   Labs ordered based on risk factors: reviewed all labs 11.           Referral Coordination........none indicated 12.           Care  Plan.......Marland Kitchenreviewed medications decrease Actos to 15 mg daily because of hypoglycemia 13.            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 Oxycodone-Acetaminophen (Oxycodone-Acetaminophen)  Past History:  Past medical, surgical, family and social histories (including risk factors) reviewed, and no changes noted (except as noted below).  Past Medical History: Reviewed history from 06/15/2007 and no changes required. Sleep Apnea Diabetes mellitus, type II Hypertension  Past Surgical History: Reviewed history from 06/15/2007 and no changes required. Surgery - sleep apnea Foot surgery  Family History: Reviewed history from 06/15/2007 and no changes required. Family History of Glaucoma Family History of Colon CA 1st degree relative <60 Family History Psychiatric care Family History of Cardiovascular disorder Family History of Respiratory disease  Social History: Reviewed history from 11/06/2008 and no changes required. Occupation: Network engineer Married Current Smoker Alcohol use-yes Regular exercise-no married but no sexual relations  Review of Systems      See HPI  Physical Exam  General:  Well-developed,well-nourished,in no acute distress; alert,appropriate and cooperative throughout examination Head:  Normocephalic and atraumatic without obvious abnormalities. No apparent alopecia or balding. Eyes:  No corneal or conjunctival inflammation noted. EOMI. Perrla.  Funduscopic exam benign, without hemorrhages, exudates or papilledema. Vision grossly normal. Ears:  External ear exam shows no significant lesions or deformities.  Otoscopic examination reveals clear canals, tympanic membranes are intact bilaterally without bulging, retraction, inflammation or discharge. Hearing is grossly normal bilaterally. Nose:  External nasal examination shows no deformity or inflammation. Nasal mucosa are pink and moist without lesions or  exudates. Mouth:  Oral mucosa and oropharynx without lesions or exudates.  Teeth in good repair. Neck:  No deformities, masses, or tenderness noted. Chest Wall:  No deformities, masses, tenderness or gynecomastia noted. Breasts:  No masses or gynecomastia noted Lungs:  Normal respiratory effort, chest expands symmetrically. Lungs are clear to auscultation, no crackles or wheezes. Heart:  Normal rate and regular rhythm. S1 and S2 normal without gallop, murmur, click, rub or other extra sounds. Abdomen:  Bowel sounds positive,abdomen soft and non-tender without masses, organomegaly or hernias noted. Rectal:  No external abnormalities noted. Normal sphincter tone. No rectal masses or tenderness. Genitalia:  Testes bilaterally descended without nodularity, tenderness or masses. No scrotal masses or lesions. No penis lesions or urethral discharge. Prostate:  Prostate gland firm and smooth, no enlargement, nodularity, tenderness, mass, asymmetry or induration. Msk:  No deformity or scoliosis noted of thoracic or lumbar spine.   Pulses:  R and L carotid,radial,femoral,dorsalis pedis and posterior tibial pulses are full and equal bilaterally Extremities:  No clubbing, cyanosis, edema, or deformity noted with normal full range of motion of all joints.   Neurologic:  No cranial nerve deficits noted. Station and gait are normal. Plantar reflexes are down-going bilaterally. DTRs are symmetrical throughout. Sensory, motor and coordinative functions appear intact. Skin:  Intact without suspicious lesions or rashes Cervical Nodes:  No lymphadenopathy noted Axillary Nodes:  No palpable lymphadenopathy Inguinal Nodes:  No significant adenopathy Psych:  Cognition and judgment appear intact. Alert and cooperative with normal attention span and concentration. No apparent delusions, illusions, hallucinations  Diabetes Management Exam:    Foot Exam (with socks and/or shoes not present):       Sensory-Pinprick/Light  touch:          Left medial foot (L-4): normal          Left dorsal foot (L-5): normal          Left lateral foot (S-1): normal          Right medial foot (L-4): normal          Right dorsal foot (L-5): normal          Right lateral foot (S-1): normal       Sensory-Monofilament:          Left foot: normal          Right foot: normal       Inspection:          Left foot: normal          Right foot: normal       Nails:          Left foot: normal          Right foot: normal    Eye Exam:       Eye Exam done elsewhere          Date: 03/05/2010          Results: normal          Done by: opth   Problems:  Medical Problems Added: 1)  Dx of Autonomic Neuropathy, Diabetic  (ICD-250.60)  Impression &  Recommendations:  Problem # 1:  HYPERLIPIDEMIA (ICD-272.4) Assessment Improved  His updated medication list for this problem includes:    Fenofibrate Micronized 200 Mg Caps (Fenofibrate micronized) .[name]... Take one tab once daily  Orders: Prescription Created Electronically [phone]) Medicare -1st Annual Wellness Visit [phone]) EKG w/ Interpretation (93000)  Problem # 2:  PHYSICAL EXAMINATION (ICD-V70.0) Assessment: Unchanged  Orders: Prescription Created Electronically [phone]) Medicare -1st Annual Wellness Visit [phone]) EKG w/ Interpretation (93000)  Problem # 3:  DIABETES MELLITUS, TYPE II (ICD-250.00) Assessment: Improved  The following medications were removed from the medication list:    Actos 30 Mg Tabs (Pioglitazone hcl) .[name]... Take one tab once daily His updated medication list for this problem includes:    Adult Aspirin Low Strength 81 Mg Tbdp (Aspirin) .[name]... Take 1 tablet by mouth once a day    Glipizide Xl 10 Mg Tb24 (Glipizide) .[name]... Take 1 tablet by mouth two times a day    Zestril 20 Mg Tabs (Lisinopril) .[name]... Take 1 tablet by mouth every morning    Actos 15 Mg Tabs (Pioglitazone hcl) .[name]... Take 1 tablet by mouth every morning  Orders: Prescription Created  Electronically [phone]) Medicare -1st Annual Wellness Visit [phone])  Problem # 4:  AUTONOMIC NEUROPATHY, DIABETIC (ICD-250.60) Assessment: New  The following medications were removed from the medication list:    Actos 30 Mg Tabs (Pioglitazone hcl) .[name]... Take one tab once daily His updated medication list for this problem includes:    Adult Aspirin Low Strength 81 Mg Tbdp (Aspirin) .[name]... Take 1 tablet by mouth once a day    Glipizide Xl 10 Mg Tb24 (Glipizide) .[name]... Take 1 tablet by mouth two times a day    Zestril 20 Mg Tabs (Lisinopril) .[name]... Take 1 tablet by mouth every morning    Actos 15 Mg Tabs (Pioglitazone hcl) .[name]... Take 1 tablet by mouth every morning  Orders: Prescription Created Electronically [id number]) Medicare -1st Annual Wellness Visit [phone])  Complete Medication List: 1)  Hydrocortisone Valerate 0.2 % Crea (Hydrocortisone valerate) .... Uad 2)  Onetouch Ultra Test Strp (Glucose blood) .... Uad 3)  Adult Aspirin Low Strength 81 Mg Tbdp (Aspirin) .... Take 1 tablet by mouth once a day 4)  Glipizide Xl 10 Mg Tb24 (Glipizide) .... Take 1 tablet by mouth two times a day 5)  Fenofibrate Micronized 200 Mg Caps (Fenofibrate micronized) .... Take one tab once daily 6)  Zestril 20 Mg Tabs (Lisinopril) .... Take 1 tablet by mouth every morning 7)  Glucosamine 1500 Complex Caps (Glucosamine-chondroit-vit c-mn) .... Once daily 8)  Tylenol 325 Mg Tabs (Acetaminophen) .... One tab as needed for pain 9)  Amitriptyline Hcl 10 Mg Tabs (Amitriptyline hcl) .[name].. 1 tab @ bedtime 10)  Actos 15 Mg Tabs (Pioglitazone hcl) .... Take 1 tablet by mouth every morning  Patient Instructions: 1)  begin Elavil 10 mg a day at bedtime. 2)  Decrease the Actos to 15 mg daily. 3)  Check a blood sugar 3 times a week. 4)  Return in 3 months for follow-up...250.00 5)  BMP prior to visit, ICD-9: 6)  HbgA1C prior to visit, ICD-9: 7)  begin a walking program 30 minutes daily 8)  Check your blood sugars  regularly. If your readings are usually above : or below 70 you should contact our office. 9)  It is important that your Diabetic A1c level is checked every 3 months. 10)  See your eye doctor yearly to check for diabetic eye damage. 11)  Check your  feet each night for sore areas, calluses or signs of infection. 12)  Check your Blood Pressure regularly. If it is above: you should make an appointment. Prescriptions: ZESTRIL 20 MG TABS (LISINOPRIL) Take 1 tablet by mouth every morning  #100 x 3   Entered and Authorized by:   Roderick Pee MD   Signed by:   Roderick Pee MD on 11/25/2010   Method used:   Electronically to        [facility] (retail)       [address]       [place], [place]  [address]       Ph: [phone]       Fax: [phone]   RxID:   [id number] FENOFIBRATE MICRONIZED 200 MG CAPS (FENOFIBRATE MICRONIZED) take one tab once daily  #100 x 3   Entered and Authorized by:   Roderick Pee MD   Signed by:   Roderick Pee MD on 11/25/2010   Method used:   Electronically to        [facility] (retail)       [address]       [place], [place]  [address]       Ph: [phone]       Fax: [phone]   RxID:   [id number] GLIPIZIDE XL 10 MG  TB24 (GLIPIZIDE) Take 1 tablet by mouth two times a day  #200 x 3   Entered and Authorized by:   Roderick Pee MD   Signed by:   Roderick Pee MD on 11/25/2010   Method used:   Electronically to        [facility] (retail)       [address]       [place], [place]  [address]       Ph: [phone]       Fax: [phone]   RxID:   [id number] Darrell [name] TEST  STRP (GLUCOSE BLOOD) uad  #100 x 2   Entered and Authorized by:   Roderick Pee MD   Signed by:   Roderick Pee MD on 11/25/2010   Method used:   Electronically to        [facility] (retail)       [address]       [place], [place]  [address]       Ph: [phone]       Fax: [phone]   RxID:   [id number] HYDROCORTISONE VALERATE 0.2 % CREA  (HYDROCORTISONE VALERATE) uad  #60 gr x 2   Entered and Authorized by:   Roderick Pee MD   Signed by:   Roderick Pee MD on 11/25/2010   Method used:   Electronically to        [facility] (retail)       [address]       [place], [place]  [address]       Ph: [phone]       Fax: [phone]   RxID:   [id number] ACTOS 15 MG TABS (PIOGLITAZONE HCL) Take 1 tablet by mouth every morning  #100 x 3   Entered and Authorized by:   Roderick Pee MD   Signed by:   Roderick Pee MD on 11/25/2010   Method used:   Electronically to        [facility] (retail)       [address]       [place], [place]  [address]  Ph: [phone]       Fax: [phone]   RxID:   [id number] AMITRIPTYLINE HCL 10 MG TABS (AMITRIPTYLINE HCL) 1 tab @ bedtime  #100 x 3   Entered and Authorized by:   Roderick Pee MD   Signed by:   Roderick Pee MD on 11/25/2010   Method used:   Electronically to        [facility] (retail)       [address]       [place], [place]  [address]       Ph: [phone]       Fax: [phone]   RxID:   [id number]    Orders Added: 1)  Prescription Created Electronically [id number] 2)  Medicare -1st Annual Wellness Visit [G0438] 3)  EKG w/ Interpretation [93[address]]

## 2010-12-08 ENCOUNTER — Other Ambulatory Visit: Payer: Self-pay | Admitting: Family Medicine

## 2010-12-08 DIAGNOSIS — M3390 Dermatopolymyositis, unspecified, organ involvement unspecified: Secondary | ICD-10-CM

## 2011-02-17 ENCOUNTER — Other Ambulatory Visit (INDEPENDENT_AMBULATORY_CARE_PROVIDER_SITE_OTHER): Payer: Medicare Other | Admitting: Family Medicine

## 2011-02-17 LAB — BASIC METABOLIC PANEL
BUN: 12 mg/dL (ref 6–23)
CO2: 26 mEq/L (ref 19–32)
Chloride: 104 mEq/L (ref 96–112)
Creatinine, Ser: 1.1 mg/dL (ref 0.4–1.5)
Glucose, Bld: 116 mg/dL — ABNORMAL HIGH (ref 70–99)

## 2011-02-24 ENCOUNTER — Ambulatory Visit (INDEPENDENT_AMBULATORY_CARE_PROVIDER_SITE_OTHER): Payer: Medicare Other | Admitting: Family Medicine

## 2011-02-24 ENCOUNTER — Encounter: Payer: Self-pay | Admitting: Family Medicine

## 2011-02-24 DIAGNOSIS — E1149 Type 2 diabetes mellitus with other diabetic neurological complication: Secondary | ICD-10-CM

## 2011-02-24 DIAGNOSIS — G609 Hereditary and idiopathic neuropathy, unspecified: Secondary | ICD-10-CM

## 2011-02-24 DIAGNOSIS — E119 Type 2 diabetes mellitus without complications: Secondary | ICD-10-CM
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
## 2011-02-24 NOTE — Patient Instructions (Signed)
Continue your current medications.  Follow-up office visit in 3 months.  A1c1 week prior

## 2011-03-04 ENCOUNTER — Other Ambulatory Visit: Payer: Self-pay | Admitting: *Deleted

## 2011-03-13 NOTE — Op Note (Signed)
[name]  Patient:    Darrell [name], Darrell [name]                    MRN: [id number] Proc. Date: 12/03/00 Adm. Date:  [id number] Attending:  Charlton Haws CC:         Evette Georges, M.D. [facility]   Operative Report  CCS# [phone]  PREOPERATIVE DIAGNOSIS:  Umbilical hernia.  POSTOPERATIVE DIAGNOSIS:  Umbilical hernia.  OPERATION PERFORMED:  Repair of umbilical hernia.  SURGEON:  Currie Paris, M.D.  ANESTHESIA:  INDICATIONS FOR PROCEDURE:  The patient has presented with gradually enlarging umbilical hernia.  DESCRIPTION OF PROCEDURE:  The patient was brought to the operating room having had the hernia identified in the preop area.  He was given satisfactory general anesthesia and the abdomen was shaved prepped and draped.  I used 0.5% plain Marcaine to infiltrate the area so that we would have some help with postoperative analgesia.  I made a curvilinear incision at the base of the umbilicus and elevated the skin off of the hernia sac.  The hernia sac was excised and some omentum that was protruding out reduced.  The fascia was cleaned off for a centimeter or so around the defect.  I placed a mesh plug which I fashioned from a 3 x 6 inch piece of mesh making it considerably smaller and placing it in the defect and then closing the defect with three sutures of 0 Prolene.  The wound appeared to be dry.  The subcutaneous tissues were closed with 3-0 Vicryl suture and the skin with 4-0 Monocryl plus Durabond.  The patient tolerated the procedure well.  There were no operative complications.  All counts were correct. DD:  12/03/00 TD:  12/04/00 Job: [id number]

## 2011-03-13 NOTE — Op Note (Signed)
NAME:  Darrell [name], Darrell [name]             ACCOUNT NO.:  [id number]   MEDICAL RECORD NO.:  [id number]          PATIENT TYPE:  AMB   LOCATION:  DSC                          FACILITY:  [facility]   PHYSICIAN:  Currie Paris, M.D.DATE OF BIRTH:  [birth year]   DATE OF PROCEDURE:  12/02/2006  DATE OF DISCHARGE:                               OPERATIVE REPORT   OFFICE MEDICAL RECORD NUMBER:  [id number].   PREOPERATIVE DIAGNOSIS:  Epigastric hernia.   POSTOPERATIVE DIAGNOSIS:  Epigastric hernia.   OPERATION:  Repair of epigastric hernia with mesh.   SURGEON:  Currie Paris, M.D.   ASSISTANT:  Hulen Shouts, PA student.   ANESTHESIA:  General (LMA).   CLINICAL HISTORY:  This gentleman had an umbilical hernia repaired in  2002.  He has developed a bulge above the umbilicus and now appears to  have about a 1.5-2 cm defect that repairs easily.  Also noticed was that  he had a diastasis.  The area of the prior umbilical hernia repair  appear still intact.  After discussion with the patient, he elected to  have this repaired.   DESCRIPTION OF PROCEDURE:  The patient was seen in the holding area and  had no further questions.  Identified and marked the area of the hernia  on the abdominal wall.   The patient was taken to the operating room and, after satisfactory  general anesthesia had been obtained, the abdomen was clipped, prepped,  and draped.  The time-out occurred.   To help with postop analgesia, I injected 0.25% plain Marcaine, and by  the end of the case had used approximately 20 cc.  I made a midline  incision directly over the defect.  I saw the preperitoneal fat  protruding through, began to free this up, and found very thin anterior  fascia.  The defect was about 2.5 cm in length and circular.  I kept out  of the peroneal cavity, pushing the peritoneum posteriorly.  In order to  get a good repair with a nice flat piece of mesh, I elected to extend  the incision little  superiorly and inferiorly and freed the subcu off of  the prior repair of the umbilical hernia.  Again. this was intact. and  this defect appeared to be above the umbilicus.   I then freed up the subcutaneous tissue off of the fascia for a distance  of about 3 cm in all directions around the defect.  This was all done  with cautery.   The defect was then closed with interrupted 0 Prolenes, leaving  alternate once tied but uncut.   I then took a 3 x 6 inch piece of Marlex mesh and sutured this in.  I  started with a superior suture at the very apex of the area that I  cleaned off well above the repair.  I then threaded the sutures from the  direct closure of the defect through the mesh and tied those down.  I  then used several anchoring sutures to anchor this at the margins of the  periphery of where I had the fascia freed  up to produce a nice patch  over the area that was directly attached to the repair with the original  repair sutures.   I did it continue to inject Marcaine as I worked, and then at this point  everything appeared to be dry.  My new mesh patch overlied the old mesh  from the umbilical repair which was left intact, as it was well  incorporated.   The subcu was closed with some 3-0 Vicryl and the skin with 4-0 Monocryl  subcuticular plus some Dermabond.   The patient tolerated the procedure well.  There were no operative  complications.  All counts were correct.      Currie Paris, M.D.  Electronically Signed     CJS/MEDQ  D:  12/02/2006  T:  12/02/2006  Job:  [id number]   cc:   Tinnie Gens A. Tawanna Cooler, MD

## 2011-05-12 ENCOUNTER — Other Ambulatory Visit (INDEPENDENT_AMBULATORY_CARE_PROVIDER_SITE_OTHER): Payer: Medicare Other

## 2011-05-12 DIAGNOSIS — E119 Type 2 diabetes mellitus without complications: Secondary | ICD-10-CM

## 2011-05-12 LAB — HEMOGLOBIN A1C: Hgb A1c MFr Bld: 7.6 % — ABNORMAL HIGH (ref 4.6–6.5)

## 2011-05-12 LAB — BASIC METABOLIC PANEL
CO2: 26 mEq/L (ref 19–32)
Calcium: 8.7 mg/dL (ref 8.4–10.5)
Creatinine, Ser: 1 mg/dL (ref 0.4–1.5)
GFR: 77.61 mL/min (ref >60.00)
Sodium: 136 mEq/L (ref 135–145)

## 2011-05-18 ENCOUNTER — Ambulatory Visit (INDEPENDENT_AMBULATORY_CARE_PROVIDER_SITE_OTHER): Payer: Medicare Other | Admitting: Family Medicine

## 2011-05-18 ENCOUNTER — Encounter: Payer: Self-pay | Admitting: Family Medicine

## 2011-05-18 VITALS — BP 130/90 | Temp 98.5°F | Wt 264.0 lb

## 2011-05-18 DIAGNOSIS — E119 Type 2 diabetes mellitus without complications: Secondary | ICD-10-CM

## 2011-05-18 MED ORDER — METFORMIN HCL 500 MG PO TABS: ORAL_TABLET | ORAL | Status: DC

## 2011-05-18 NOTE — Progress Notes (Signed)
  Subjective:    Patient ID: Darrell [name], male    DOB: 17-Apr-1946, 65 y.o.   MRN: [id number] [name] is a 65 year old, married man nonsmoker comes in today for follow-up of diabetes, type II.  He is currently taking glipizide 10 b.i.d. And Actos 15 daily blood sugars risen to the 160 range.  A1c has gone from 7.1% 7.6.  His weight is constant is 264 and is not dieting or exercising. HPI    Review of Systems General and metabolic review of systems otherwise negative    Objective:   Physical Exam Well-developed well-nourished, male in no acute distress       Assessment & Plan:  Diabetes type II.  Plan DC Actos, and metformin is GI side effects start insulin

## 2011-05-18 NOTE — Patient Instructions (Signed)
Continue the glipizide, 10 mg b.i.d.  Stop the Actos.  Take one half of the 500-mg metformin prior to breakfast.  Check a fasting blood sugar daily.  If after one month your blood sugar is at 100 continue that dose if not, then increase it to 250 mg twice daily.  Return in 3 months for follow-up, sooner if any problems

## 2011-06-10 ENCOUNTER — Other Ambulatory Visit: Payer: Self-pay | Admitting: *Deleted

## 2011-06-10 MED ORDER — METFORMIN HCL 500 MG PO TABS: 500.0000 mg | ORAL_TABLET | Freq: Every day | ORAL | Status: DC

## 2011-06-11 ENCOUNTER — Other Ambulatory Visit: Payer: Self-pay | Admitting: *Deleted

## 2011-06-11 NOTE — Telephone Encounter (Signed)
patient  Came in to go over medication list.  Patient understands directions.

## 2011-07-09 ENCOUNTER — Telehealth: Payer: Self-pay | Admitting: Family Medicine

## 2011-07-09 NOTE — Telephone Encounter (Signed)
Increase metformin to 500 mg b.i.d. Continue to monitor blood sugar.  Return in one month for follow-up

## 2011-07-09 NOTE — Telephone Encounter (Signed)
Was put on Metformin 250mg  bid. Sugars are running from 150- 170 and a few spikes of around 200. Requesting to try 500mg  bid. If so, please call [facility]. Would like for his nurse to return call. Thanks.

## 2011-07-10 MED ORDER — METFORMIN HCL 500 MG PO TABS: 500.0000 mg | ORAL_TABLET | Freq: Every day | ORAL | Status: DC

## 2011-07-17 MED ORDER — METFORMIN HCL ER (MOD) 500 MG PO TB24: 500.0000 mg | ORAL_TABLET | Freq: Two times a day (BID) | ORAL | Status: DC

## 2011-08-11 ENCOUNTER — Other Ambulatory Visit (INDEPENDENT_AMBULATORY_CARE_PROVIDER_SITE_OTHER): Payer: Medicare Other

## 2011-08-11 DIAGNOSIS — E119 Type 2 diabetes mellitus without complications: Secondary | ICD-10-CM

## 2011-08-11 LAB — BASIC METABOLIC PANEL
CO2: 24 mEq/L (ref 19–32)
Calcium: 8.8 mg/dL (ref 8.4–10.5)
Creatinine, Ser: 1.2 mg/dL (ref 0.4–1.5)
Glucose, Bld: 194 mg/dL — ABNORMAL HIGH (ref 70–99)

## 2011-08-17 ENCOUNTER — Encounter: Payer: Self-pay | Admitting: Family Medicine

## 2011-08-17 ENCOUNTER — Ambulatory Visit (INDEPENDENT_AMBULATORY_CARE_PROVIDER_SITE_OTHER): Payer: Medicare Other | Admitting: Family Medicine

## 2011-08-17 DIAGNOSIS — E119 Type 2 diabetes mellitus without complications: Secondary | ICD-10-CM

## 2011-08-17 DIAGNOSIS — I10 Essential (primary) hypertension: Secondary | ICD-10-CM

## 2011-08-17 NOTE — Progress Notes (Signed)
  Subjective:    Patient ID: RAJU COPPOLINO, male    DOB: October 04, 1946, 65 y.o.   MRN: [id number]  HPI [name] is a 65 year old, married male, nonsmoker, who comes in today for evaluation of diabetes, type 2, and hypertension.  His blood sugar now is in the 194 range.  A1c7 .8%.  He is not been very compliant with his diet nor is he walking on a daily basis.  He is taking his medication metformin 500 b.i.d. And glipizide 10 mg b.i.d.  We discussed adding insulin.  He is still locked and to do that.  We reemphasized diet, exercise and weight loss.  Blood pressure normal 124/84  on Prinzide 20 mg daily.   Review of Systems General metabolic cardiovascular his systems otherwise negative    Objective:   Physical Exam Overweight male in no acute distress.  Weight now 254       Assessment & Plan:  Diabetes type II not at goal.  Emphasized on exercise, weight loss increase metformin to 1000 mg prior to breakfast and continue the 500-mg dose prior to evening meal.  Also continue glipizide 10 mg b.i.d. Follow-up CPX in January

## 2011-08-17 NOTE — Patient Instructions (Signed)
Walk 20 minutes daily.  Increase the metformin to 1000 mg prior to breakfast and continue the 500-mg dose prior to evening meal.  Also continue your other oral medications.  Really tighten up on your diet, avoid all sugar.  Set up your physical examination in January,,,,,,,,,,,,,,,,,,,, fasting labs one week prior

## 2011-08-18 ENCOUNTER — Other Ambulatory Visit: Payer: Self-pay | Admitting: *Deleted

## 2011-08-18 MED ORDER — METFORMIN HCL 1000 MG PO TABS: ORAL_TABLET | ORAL | Status: DC

## 2011-11-13 ENCOUNTER — Other Ambulatory Visit (INDEPENDENT_AMBULATORY_CARE_PROVIDER_SITE_OTHER): Payer: Medicare Other

## 2011-11-13 DIAGNOSIS — I10 Essential (primary) hypertension: Secondary | ICD-10-CM

## 2011-11-13 DIAGNOSIS — E119 Type 2 diabetes mellitus without complications: Secondary | ICD-10-CM

## 2011-11-13 LAB — MICROALBUMIN / CREATININE URINE RATIO
Creatinine,U: 106 mg/dL
Microalb, Ur: 0.5 mg/dL (ref 0.0–1.9)

## 2011-11-13 LAB — CBC WITH DIFFERENTIAL/PLATELET
Basophils Absolute: 0 10*3/uL (ref 0.0–0.1)
Basophils Relative: 0.4 % (ref 0.0–3.0)
Eosinophils Absolute: 0.2 10*3/uL (ref 0.0–0.7)
HCT: 42.6 % (ref 39.0–52.0)
Hemoglobin: 14.5 g/dL (ref 13.0–17.0)
Lymphs Abs: 1.7 10*3/uL (ref 0.7–4.0)
MCHC: 34 g/dL (ref 30.0–36.0)
Monocytes Relative: 7.4 % (ref 3.0–12.0)
Neutro Abs: 5.1 10*3/uL (ref 1.4–7.7)
RBC: 4.86 Mil/uL (ref 4.22–5.81)
RDW: 14.4 % (ref 11.5–14.6)

## 2011-11-13 LAB — POCT URINALYSIS DIPSTICK
Bilirubin, UA: NEGATIVE
Blood, UA: NEGATIVE
Glucose, UA: NEGATIVE
Nitrite, UA: NEGATIVE
Spec Grav, UA: 1.02
pH, UA: 5

## 2011-11-13 LAB — HEPATIC FUNCTION PANEL
ALT: 28 U/L (ref 0–53)
Albumin: 3.9 g/dL (ref 3.5–5.2)
Total Protein: 6.6 g/dL (ref 6.0–8.3)

## 2011-11-13 LAB — LIPID PANEL
Cholesterol: 116 mg/dL (ref 0–200)
Triglycerides: 175 mg/dL — ABNORMAL HIGH (ref 0.0–149.0)

## 2011-11-13 LAB — TSH: TSH: 1.47 u[IU]/mL (ref 0.35–5.50)

## 2011-11-16 LAB — BASIC METABOLIC PANEL
BUN: 13 mg/dL (ref 6–23)
Creatinine, Ser: 0.9 mg/dL (ref 0.4–1.5)
GFR: 85.15 mL/min (ref >60.00)
Glucose, Bld: 131 mg/dL — ABNORMAL HIGH (ref 70–99)
Potassium: 4.4 mEq/L (ref 3.5–5.1)

## 2011-11-30 ENCOUNTER — Ambulatory Visit (INDEPENDENT_AMBULATORY_CARE_PROVIDER_SITE_OTHER): Payer: Medicare Other | Admitting: Family Medicine

## 2011-11-30 ENCOUNTER — Encounter: Payer: Self-pay | Admitting: Family Medicine

## 2011-11-30 DIAGNOSIS — I10 Essential (primary) hypertension: Secondary | ICD-10-CM

## 2011-11-30 DIAGNOSIS — R0989 Other specified symptoms and signs involving the circulatory and respiratory systems: Secondary | ICD-10-CM

## 2011-11-30 DIAGNOSIS — H35379 Puckering of macula, unspecified eye: Secondary | ICD-10-CM | POA: Diagnosis not present

## 2011-11-30 DIAGNOSIS — E119 Type 2 diabetes mellitus without complications: Secondary | ICD-10-CM

## 2011-11-30 DIAGNOSIS — Z0000 Encounter for general adult medical examination without abnormal findings: Secondary | ICD-10-CM

## 2011-11-30 DIAGNOSIS — E785 Hyperlipidemia, unspecified: Secondary | ICD-10-CM

## 2011-11-30 DIAGNOSIS — H5310 Unspecified subjective visual disturbances: Secondary | ICD-10-CM | POA: Diagnosis not present

## 2011-11-30 DIAGNOSIS — E1149 Type 2 diabetes mellitus with other diabetic neurological complication: Secondary | ICD-10-CM

## 2011-11-30 DIAGNOSIS — M3313 Other dermatomyositis without myopathy: Secondary | ICD-10-CM

## 2011-11-30 DIAGNOSIS — G4733 Obstructive sleep apnea (adult) (pediatric): Secondary | ICD-10-CM

## 2011-11-30 DIAGNOSIS — M3390 Dermatopolymyositis, unspecified, organ involvement unspecified: Secondary | ICD-10-CM

## 2011-11-30 MED ORDER — LISINOPRIL 20 MG PO TABS: 20.0000 mg | ORAL_TABLET | Freq: Every day | ORAL | Status: DC

## 2011-11-30 MED ORDER — GLIPIZIDE ER 10 MG PO TB24: ORAL_TABLET | ORAL | Status: DC

## 2011-11-30 MED ORDER — METFORMIN HCL 1000 MG PO TABS: 1000.0000 mg | ORAL_TABLET | Freq: Two times a day (BID) | ORAL | Status: DC

## 2011-11-30 MED ORDER — AMITRIPTYLINE HCL 10 MG PO TABS: 10.0000 mg | ORAL_TABLET | Freq: Every day | ORAL | Status: DC

## 2011-11-30 MED ORDER — FENOFIBRATE MICRONIZED 200 MG PO CAPS: 200.0000 mg | ORAL_CAPSULE | Freq: Every day | ORAL | Status: DC

## 2011-11-30 NOTE — Patient Instructions (Signed)
Continue the current medications except increase the metformin to 1000 mg twice daily  Really tighten up on your diet and walk 20 minutes daily  Fasting blood sugar daily in the morning  We will set up a consult and cardiology to do the carotid Dopplers.

## 2011-11-30 NOTE — Progress Notes (Signed)
  Subjective:    Patient ID: Darrell [name], male    DOB: Mar 06, 1946, 66 y.o.   MRN: [id number]  HPI Darrell [name]  is a 66 year old married male who comes in today for a Medicare wellness examination because of a history of hypertension, diabetes type 2, hyperlipidemia, obesity the classic metabolic syndrome.  His medicines are reviewed there have been no changes.  His blood pressure is running 130/90 however his blood sugars are in the 1:30 to 150 range hemoglobin A1c is up to 7.8%.  He gets routine eye care in deed he has blurred vision in his right eye for the last 3 days referred back to his ophthalmologist ASAP, regular dental care, hearing normal, colonoscopy and GI every 5 years because of family history of colon cancer, vaccinations tetanus 2006, Pneumovax 2009, seasonal flu shot 2012, shingles 2006  Cognitive function normal he continues to work on a regular basis. Activities of daily living normal except he does not exercise on a regular basis cognitive function normal, home health safety reviewed no issues identified, no guns in the house, he does have a health care power of attorney and living will   Review of Systems  Constitutional: Negative.   HENT: Negative.   Eyes: Positive for visual disturbance.  Respiratory: Negative.   Cardiovascular: Negative.   Gastrointestinal: Negative.   Genitourinary: Negative.   Musculoskeletal: Negative.   Skin: Negative.   Neurological: Negative.   Hematological: Negative.   Psychiatric/Behavioral: Negative.        Objective:   Physical Exam  Constitutional: He is oriented to person, place, and time. He appears well-developed and well-nourished.  HENT:  Head: Normocephalic and atraumatic.  Right Ear: External ear normal.  Left Ear: External ear normal.  Nose: Nose normal.  Mouth/Throat: Oropharynx is clear and moist.  Eyes: Conjunctivae and EOM are normal. Pupils are equal, round, and reactive to light.  Neck: Normal range of motion.  Neck supple. No JVD present. No tracheal deviation present. No thyromegaly present.       Left carotid soft bruit right carotid normal  Cardiovascular: Normal rate, regular rhythm, normal heart sounds and intact distal pulses.  Exam reveals no gallop and no friction rub.   No murmur heard. Pulmonary/Chest: Effort normal and breath sounds normal. No stridor. No respiratory distress. He has no wheezes. He has no rales. He exhibits no tenderness.  Abdominal: Soft. Bowel sounds are normal. He exhibits no distension and no mass. There is no tenderness. There is no rebound and no guarding.  Genitourinary: Rectum normal, prostate normal and penis normal. Guaiac negative stool. No penile tenderness.  Musculoskeletal: Normal range of motion. He exhibits no edema and no tenderness.  Lymphadenopathy:    He has no cervical adenopathy.  Neurological: He is alert and oriented to person, place, and time. He has normal reflexes. No cranial nerve deficit. He exhibits normal muscle tone.  Skin: Skin is warm and dry. No rash noted. No erythema. No pallor.  Psychiatric: He has a normal mood and affect. His behavior is normal. Judgment and thought content normal.          Assessment & Plan:  Metabolic syndrome continue current medications except increase metformin to 1000 mg twice a day blood sugar daily in the morning followup in 3 weeks    New problem,,,,,,,,, left carotid bruit refer to vascular for diagnostic ultrasound.

## 2011-12-01 DIAGNOSIS — H3400 Transient retinal artery occlusion, unspecified eye: Secondary | ICD-10-CM | POA: Diagnosis not present

## 2011-12-01 DIAGNOSIS — E119 Type 2 diabetes mellitus without complications: Secondary | ICD-10-CM | POA: Diagnosis not present

## 2011-12-01 DIAGNOSIS — H35379 Puckering of macula, unspecified eye: Secondary | ICD-10-CM | POA: Diagnosis not present

## 2011-12-01 DIAGNOSIS — H2510 Age-related nuclear cataract, unspecified eye: Secondary | ICD-10-CM | POA: Diagnosis not present

## 2011-12-01 DIAGNOSIS — H43819 Vitreous degeneration, unspecified eye: Secondary | ICD-10-CM | POA: Diagnosis not present

## 2011-12-02 ENCOUNTER — Encounter (INDEPENDENT_AMBULATORY_CARE_PROVIDER_SITE_OTHER): Payer: Medicare Other | Admitting: Cardiology

## 2011-12-02 ENCOUNTER — Other Ambulatory Visit: Payer: Self-pay | Admitting: Cardiology

## 2011-12-02 DIAGNOSIS — G459 Transient cerebral ischemic attack, unspecified: Secondary | ICD-10-CM

## 2011-12-02 DIAGNOSIS — I6529 Occlusion and stenosis of unspecified carotid artery: Secondary | ICD-10-CM | POA: Diagnosis not present

## 2011-12-02 DIAGNOSIS — H3582 Retinal ischemia: Secondary | ICD-10-CM

## 2011-12-04 ENCOUNTER — Telehealth: Payer: Self-pay | Admitting: *Deleted

## 2011-12-04 DIAGNOSIS — I6529 Occlusion and stenosis of unspecified carotid artery: Secondary | ICD-10-CM | POA: Insufficient documentation

## 2011-12-04 NOTE — Telephone Encounter (Signed)
Dr Tawanna Cooler is not here today?

## 2011-12-04 NOTE — Telephone Encounter (Signed)
Spoke to patient concerning the results of carotid duplex.

## 2011-12-04 NOTE — Telephone Encounter (Signed)
Pt is expecting a call from Dr. Tawanna Cooler today, and wants someone to let him know about what time????

## 2011-12-07 ENCOUNTER — Encounter: Payer: Self-pay | Admitting: Vascular Surgery

## 2011-12-08 ENCOUNTER — Encounter (HOSPITAL_COMMUNITY): Payer: Self-pay | Admitting: Pharmacy Technician

## 2011-12-08 ENCOUNTER — Encounter (HOSPITAL_COMMUNITY)
Admission: RE | Admit: 2011-12-08 | Discharge: 2011-12-08 | Disposition: A | Discharge status: Home / Self Care | Payer: Medicare Other | Source: Ambulatory Visit | Attending: Vascular Surgery | Admitting: Vascular Surgery

## 2011-12-08 ENCOUNTER — Ambulatory Visit (INDEPENDENT_AMBULATORY_CARE_PROVIDER_SITE_OTHER): Payer: Medicare Other | Admitting: Vascular Surgery

## 2011-12-08 ENCOUNTER — Other Ambulatory Visit: Payer: Self-pay

## 2011-12-08 ENCOUNTER — Encounter: Payer: Self-pay | Admitting: Vascular Surgery

## 2011-12-08 ENCOUNTER — Encounter (HOSPITAL_COMMUNITY): Payer: Self-pay

## 2011-12-08 VITALS — BP 151/91 | HR 102 | Resp 20 | Ht 71.0 in | Wt 241.0 lb

## 2011-12-08 DIAGNOSIS — Z01811 Encounter for preprocedural respiratory examination: Secondary | ICD-10-CM | POA: Diagnosis not present

## 2011-12-08 DIAGNOSIS — I6529 Occlusion and stenosis of unspecified carotid artery: Secondary | ICD-10-CM | POA: Diagnosis not present

## 2011-12-08 DIAGNOSIS — I10 Essential (primary) hypertension: Secondary | ICD-10-CM | POA: Diagnosis not present

## 2011-12-08 LAB — CBC
HCT: 40.3 % (ref 39.0–52.0)
Hemoglobin: 13.4 g/dL (ref 13.0–17.0)
MCH: 29.2 pg (ref 26.0–34.0)
MCHC: 33.3 g/dL (ref 30.0–36.0)

## 2011-12-08 LAB — COMPREHENSIVE METABOLIC PANEL
BUN: 13 mg/dL (ref 6–23)
Calcium: 9.8 mg/dL (ref 8.4–10.5)
Creatinine, Ser: 1.07 mg/dL (ref 0.50–1.35)
GFR calc Af Amer: 82 mL/min — ABNORMAL LOW (ref >90)
Glucose, Bld: 133 mg/dL — ABNORMAL HIGH (ref 70–99)
Total Protein: 7.2 g/dL (ref 6.0–8.3)

## 2011-12-08 LAB — URINALYSIS, ROUTINE W REFLEX MICROSCOPIC
Hgb urine dipstick: NEGATIVE
Ketones, ur: NEGATIVE mg/dL
Protein, ur: NEGATIVE mg/dL
Urobilinogen, UA: 1 mg/dL (ref 0.0–1.0)

## 2011-12-08 LAB — ABO/RH: ABO/RH(D): O POS

## 2011-12-08 LAB — PROTIME-INR: Prothrombin Time: 14.1 seconds (ref 11.6–15.2)

## 2011-12-08 LAB — DIFFERENTIAL
Lymphocytes Relative: 23 % (ref 12–46)
Lymphs Abs: 1.8 10*3/uL (ref 0.7–4.0)
Neutrophils Relative %: 67 % (ref 43–77)

## 2011-12-08 LAB — TYPE AND SCREEN
ABO/RH(D): O POS
Antibody Screen: NEGATIVE

## 2011-12-08 MED ORDER — SODIUM CHLORIDE 0.9 % IV SOLN: INTRAVENOUS | Status: DC

## 2011-12-08 MED ORDER — DEXTROSE 5 % IV SOLN: 1.5000 g | Freq: Three times a day (TID) | INTRAVENOUS | Status: DC

## 2011-12-08 NOTE — Progress Notes (Signed)
Subjective:     Patient ID: Darrell [name], male   DOB: 11/15/1945, 65 y.o.   MRN: [id number]  HPI this 65-year-old male patient developed acute onset of blurred vision in the right eye with some dizziness about 10 days ago. He had donated blood which is usual routine every 3 months. That was done on January 31. He denies any history of previous stroke or lateralizing weakness, aphasia, amaurosis fugax, diplopia, blurred vision, or syncope. Following this event he was seen by Dr. Groat and Dr. Rankin who discovered that he had severe circulation problems he had no evidence of stroke according to their evaluation. Carotid duplex exam was ordered and revealed a 90-95% left common carotid artery stenosis and mild internal carotid flow reduction on the contralateral right side. I have reviewed that study and agree with those findings. He does not describe loss of entire visual field or upper quadrant but blurred vision throughout on the right side with no effect of his left visual field  Past Medical History  Diagnosis Date  . Sleep apnea   . Hypertension   . Diabetes mellitus     History  Substance Use Topics  . Smoking status: Former Smoker -- 2.5 packs/day for 30 years    Types: Cigarettes    Quit date: 12/07/2001  . Smokeless tobacco: Never Used  . Alcohol Use: Yes    Family History  Problem Relation Age of Onset  . Heart disease Father     Allergies  Allergen Reactions  . Oxycodone-Acetaminophen     REACTION: feels sick    Current outpatient prescriptions:amitriptyline (ELAVIL) 10 MG tablet, Take 1 tablet (10 mg total) by mouth at bedtime., Disp: 100 tablet, Rfl: 3;  aspirin 81 MG EC tablet, Take 81 mg by mouth daily.  , Disp: , Rfl: ;  fenofibrate micronized (LOFIBRA) 200 MG capsule, Take 1 capsule (200 mg total) by mouth daily before breakfast., Disp: 100 capsule, Rfl: 3;  glipiZIDE (GLIPIZIDE XL) 10 MG 24 hr tablet, 1 by mouth twice a day, Disp: 200 tablet, Rfl:  3 hydrocortisone (WESTCORT) 0.2 % cream, , Disp: , Rfl: ;  lisinopril (PRINIVIL,ZESTRIL) 20 MG tablet, Take 1 tablet (20 mg total) by mouth daily., Disp: 100 tablet, Rfl: 3;  metFORMIN (GLUCOPHAGE) 1000 MG tablet, Take 1 tablet (1,000 mg total) by mouth 2 (two) times daily with a meal. One tab in the morning and half tab in the evening, Disp: 200 tablet, Rfl: 3;  ONE TOUCH ULTRA TEST test strip, , Disp: , Rfl:  DISCONTD: metFORMIN (GLUCOPHAGE) 500 MG tablet, One half tablet in the morning prior to breakfast, Disp: 100 tablet, Rfl: 3;  DISCONTD: metFORMIN (GLUCOPHAGE) 500 MG tablet, Take 1 tablet (500 mg total) by mouth daily with breakfast., Disp: 90 tablet, Rfl: 3  BP 151/91  Pulse 102  Resp 20  Ht 5' 11" (1.803 m)  Wt 241 lb (109.317 kg)  BMI 33.61 kg/m2  Body mass index is 33.61 kg/(m^2).         Review of Systems denies chest pain, dyspnea on exertion, does complain of easy fatigability. Has history of sleep apnea for 10 years of problems with recurrent umbilical hernia and a fracture of 1 foot. All other systems are negative and complete review of systems     Objective:   Physical Exam blood pressure 151/91 heart rate 102 respirations 20 General obese male in no apparent distress alert and oriented x3 Lungs no rhonchi or wheezing HEENT normal for age Cardiovascular   rate rhythm no murmurs carotid pulses 3+ is a very soft bruit on the right in a more harsh bruit on the left. Neurologic exam slight decreased vision grossly in the right eye Musto skillful free much deformity Abdomen obese no palpable masses Skin free of rashes Lower extremities 3+ femoral popliteal dorsalis pedis pulses palpable bilaterally with no edema.     Assessment:     Severe left carotid occlusive disease with recent visual loss right eye which has not returned    Plan:     Recommend left carotid endarterectomy  Risks and benefits of this have been thoroughly discussed with patient and will plan to  proceed tomorrow Will check blood work today to be certain hemoglobin has recovered from recent blood donor event       

## 2011-12-08 NOTE — Pre-Procedure Instructions (Signed)
20 Darrell [name]  12/08/2011   Your procedure is scheduled on:  12/09/11  Report to [facility] at 630 AM.  Call this number if you have problems the morning of surgery: [phone]   Remember:   Do not eat food:After Midnight.  May have clear liquids: up to 4 Hours before arrival.  Clear liquids include soda, tea, black coffee, apple or grape juice, broth.  Take these medicines the morning of surgery with A SIP OF WATER: *none**   Do not wear jewelry, make-up or nail polish.  Do not wear lotions, powders, or perfumes. You may wear deodorant.  Do not shave 48 hours prior to surgery.  Do not bring valuables to the hospital.  Contacts, dentures or bridgework may not be worn into surgery.  Leave suitcase in the car. After surgery it may be brought to your room.  For patients admitted to the hospital, checkout time is 11:00 AM the day of discharge.   Patients discharged the day of surgery will not be allowed to drive home.  Name and phone number of your driver: family  Special Instructions: CHG Shower Use Special Wash: 1/2 bottle night before surgery and 1/2 bottle morning of surgery.   Please read over the following fact sheets that you were given: Pain Booklet, Coughing and Deep Breathing, Blood Transfusion Information, MRSA Information and Surgical Site Infection Prevention

## 2011-12-08 NOTE — Progress Notes (Signed)
Dr Tawanna Cooler called for ekg

## 2011-12-09 ENCOUNTER — Encounter (HOSPITAL_COMMUNITY)
Admission: RE | Disposition: A | Discharge status: Rehabilitation Facility | Payer: Self-pay | Source: Ambulatory Visit | Attending: Vascular Surgery

## 2011-12-09 ENCOUNTER — Encounter (HOSPITAL_COMMUNITY): Payer: Self-pay | Admitting: Certified Registered"

## 2011-12-09 ENCOUNTER — Other Ambulatory Visit: Payer: Self-pay | Admitting: Vascular Surgery

## 2011-12-09 ENCOUNTER — Ambulatory Visit (HOSPITAL_COMMUNITY): Payer: Medicare Other | Admitting: Certified Registered"

## 2011-12-09 ENCOUNTER — Inpatient Hospital Stay (HOSPITAL_COMMUNITY): Payer: Medicare Other

## 2011-12-09 ENCOUNTER — Inpatient Hospital Stay (HOSPITAL_COMMUNITY)
Admission: RE | Admit: 2011-12-09 | Discharge: 2011-12-14 | DRG: 037 | Disposition: A | Discharge status: Rehabilitation Facility | Payer: Medicare Other | Source: Ambulatory Visit | Attending: Vascular Surgery | Admitting: Vascular Surgery

## 2011-12-09 ENCOUNTER — Ambulatory Visit (HOSPITAL_COMMUNITY): Payer: Medicare Other | Admitting: Anesthesiology

## 2011-12-09 ENCOUNTER — Encounter (HOSPITAL_COMMUNITY): Payer: Self-pay | Admitting: Anesthesiology

## 2011-12-09 ENCOUNTER — Ambulatory Visit (HOSPITAL_COMMUNITY): Payer: Medicare Other

## 2011-12-09 DIAGNOSIS — Z01818 Encounter for other preprocedural examination: Secondary | ICD-10-CM

## 2011-12-09 DIAGNOSIS — G4733 Obstructive sleep apnea (adult) (pediatric): Secondary | ICD-10-CM | POA: Diagnosis present

## 2011-12-09 DIAGNOSIS — Y832 Surgical operation with anastomosis, bypass or graft as the cause of abnormal reaction of the patient, or of later complication, without mention of misadventure at the time of the procedure: Secondary | ICD-10-CM | POA: Diagnosis not present

## 2011-12-09 DIAGNOSIS — R1312 Dysphagia, oropharyngeal phase: Secondary | ICD-10-CM | POA: Diagnosis not present

## 2011-12-09 DIAGNOSIS — H538 Other visual disturbances: Secondary | ICD-10-CM | POA: Diagnosis present

## 2011-12-09 DIAGNOSIS — IMO0002 Reserved for concepts with insufficient information to code with codable children: Secondary | ICD-10-CM | POA: Diagnosis not present

## 2011-12-09 DIAGNOSIS — E119 Type 2 diabetes mellitus without complications: Secondary | ICD-10-CM | POA: Diagnosis not present

## 2011-12-09 DIAGNOSIS — G8100 Flaccid hemiplegia affecting unspecified side: Secondary | ICD-10-CM | POA: Diagnosis not present

## 2011-12-09 DIAGNOSIS — Z87891 Personal history of nicotine dependence: Secondary | ICD-10-CM

## 2011-12-09 DIAGNOSIS — Z79899 Other long term (current) drug therapy: Secondary | ICD-10-CM

## 2011-12-09 DIAGNOSIS — Z7982 Long term (current) use of aspirin: Secondary | ICD-10-CM | POA: Diagnosis not present

## 2011-12-09 DIAGNOSIS — G909 Disorder of the autonomic nervous system, unspecified: Secondary | ICD-10-CM | POA: Diagnosis not present

## 2011-12-09 DIAGNOSIS — J45909 Unspecified asthma, uncomplicated: Secondary | ICD-10-CM | POA: Diagnosis present

## 2011-12-09 DIAGNOSIS — I6350 Cerebral infarction due to unspecified occlusion or stenosis of unspecified cerebral artery: Secondary | ICD-10-CM | POA: Diagnosis not present

## 2011-12-09 DIAGNOSIS — T82898A Other specified complication of vascular prosthetic devices, implants and grafts, initial encounter: Secondary | ICD-10-CM | POA: Diagnosis not present

## 2011-12-09 DIAGNOSIS — R29810 Facial weakness: Secondary | ICD-10-CM | POA: Diagnosis not present

## 2011-12-09 DIAGNOSIS — I6529 Occlusion and stenosis of unspecified carotid artery: Principal | ICD-10-CM | POA: Diagnosis present

## 2011-12-09 DIAGNOSIS — Z01812 Encounter for preprocedural laboratory examination: Secondary | ICD-10-CM

## 2011-12-09 DIAGNOSIS — E781 Pure hyperglyceridemia: Secondary | ICD-10-CM | POA: Diagnosis present

## 2011-12-09 DIAGNOSIS — R5381 Other malaise: Secondary | ICD-10-CM | POA: Diagnosis not present

## 2011-12-09 DIAGNOSIS — R404 Transient alteration of awareness: Secondary | ICD-10-CM | POA: Diagnosis not present

## 2011-12-09 DIAGNOSIS — I669 Occlusion and stenosis of unspecified cerebral artery: Secondary | ICD-10-CM

## 2011-12-09 DIAGNOSIS — I6330 Cerebral infarction due to thrombosis of unspecified cerebral artery: Secondary | ICD-10-CM | POA: Diagnosis not present

## 2011-12-09 DIAGNOSIS — I6340 Cerebral infarction due to embolism of unspecified cerebral artery: Secondary | ICD-10-CM | POA: Diagnosis not present

## 2011-12-09 DIAGNOSIS — D72829 Elevated white blood cell count, unspecified: Secondary | ICD-10-CM | POA: Diagnosis not present

## 2011-12-09 DIAGNOSIS — G4700 Insomnia, unspecified: Secondary | ICD-10-CM | POA: Diagnosis not present

## 2011-12-09 DIAGNOSIS — Z5189 Encounter for other specified aftercare: Secondary | ICD-10-CM | POA: Diagnosis not present

## 2011-12-09 DIAGNOSIS — R4701 Aphasia: Secondary | ICD-10-CM | POA: Diagnosis not present

## 2011-12-09 DIAGNOSIS — G319 Degenerative disease of nervous system, unspecified: Secondary | ICD-10-CM | POA: Diagnosis not present

## 2011-12-09 DIAGNOSIS — I10 Essential (primary) hypertension: Secondary | ICD-10-CM | POA: Diagnosis present

## 2011-12-09 HISTORY — PX: ENDARTERECTOMY: SHX5162

## 2011-12-09 HISTORY — PX: CAROTID ENDARTERECTOMY: SUR193

## 2011-12-09 LAB — CBC
HCT: 36.7 % — ABNORMAL LOW (ref 39.0–52.0)
MCHC: 34 g/dL (ref 30.0–36.0)
Platelets: 200 10*3/uL (ref 150–400)
RDW: 13.4 % (ref 11.5–15.5)
RDW: 13.4 % (ref 11.5–15.5)
WBC: 11.8 10*3/uL — ABNORMAL HIGH (ref 4.0–10.5)

## 2011-12-09 LAB — ANTITHROMBIN III: AntiThromb III Func: 71 % — ABNORMAL LOW (ref 75–120)

## 2011-12-09 LAB — DIFFERENTIAL
Basophils Absolute: 0 10*3/uL (ref 0.0–0.1)
Lymphocytes Relative: 4 % — ABNORMAL LOW (ref 12–46)
Neutro Abs: 10.8 10*3/uL — ABNORMAL HIGH (ref 1.7–7.7)
Neutrophils Relative %: 92 % — ABNORMAL HIGH (ref 43–77)

## 2011-12-09 LAB — GLUCOSE, CAPILLARY: Glucose-Capillary: 239 mg/dL — ABNORMAL HIGH (ref 70–99)

## 2011-12-09 LAB — PROTIME-INR
INR: 1.16 (ref 0.00–1.49)
INR: 1.05 (ref 0.00–1.49)
Prothrombin Time: 15 seconds (ref 11.6–15.2)

## 2011-12-09 LAB — APTT: aPTT: 61 seconds — ABNORMAL HIGH (ref 24–37)

## 2011-12-09 SURGERY — ENDARTERECTOMY, CAROTID
Anesthesia: General | Site: Neck | Laterality: Left | Wound class: Clean

## 2011-12-09 SURGERY — WOUND EXPLORATION
Anesthesia: General | Site: Neck | Laterality: Left | Wound class: Clean

## 2011-12-09 MED ORDER — EPHEDRINE SULFATE 50 MG/ML IJ SOLN
INTRAMUSCULAR | Status: DC | PRN
  Administered 2011-12-09: 10 mg via INTRAVENOUS

## 2011-12-09 MED ORDER — SODIUM CHLORIDE 0.9 % IR SOLN
Status: DC | PRN
  Administered 2011-12-09: 09:00:00

## 2011-12-09 MED ORDER — LABETALOL HCL 5 MG/ML IV SOLN: 10.0000 mg | INTRAVENOUS | Status: DC | PRN

## 2011-12-09 MED ORDER — DEXTROSE 5 % IV SOLN
1.5000 g | Freq: Two times a day (BID) | INTRAVENOUS | Status: AC
  Administered 2011-12-09 – 2011-12-10 (×2): 1.5 g via INTRAVENOUS
  Filled 2011-12-09 (×2): qty 1.5

## 2011-12-09 MED ORDER — SODIUM CHLORIDE 0.9 % IR SOLN
Status: DC | PRN
  Administered 2011-12-09: 1000 mL

## 2011-12-09 MED ORDER — CHLORHEXIDINE GLUCONATE 0.12 % MT SOLN
15.0000 mL | Freq: Two times a day (BID) | OROMUCOSAL | Status: DC
  Administered 2011-12-09 – 2011-12-14 (×8): 15 mL via OROMUCOSAL
  Filled 2011-12-09 (×7): qty 15

## 2011-12-09 MED ORDER — HYDROMORPHONE HCL PF 1 MG/ML IJ SOLN
INTRAMUSCULAR | Status: AC
  Filled 2011-12-09: qty 1

## 2011-12-09 MED ORDER — MEPERIDINE HCL 25 MG/ML IJ SOLN: 6.2500 mg | INTRAMUSCULAR | Status: DC | PRN

## 2011-12-09 MED ORDER — HYDROMORPHONE HCL PF 1 MG/ML IJ SOLN
0.5000 mg | INTRAMUSCULAR | Status: DC | PRN
  Administered 2011-12-09: 0.5 mg via INTRAVENOUS
  Administered 2011-12-10: 1 mg via INTRAVENOUS
  Administered 2011-12-10 – 2011-12-11 (×2): 0.5 mg via INTRAVENOUS
  Administered 2011-12-14: 1 mg via INTRAVENOUS
  Filled 2011-12-09 (×5): qty 1

## 2011-12-09 MED ORDER — LACTATED RINGERS IV SOLN
INTRAVENOUS | Status: DC | PRN
  Administered 2011-12-09 (×2): via INTRAVENOUS

## 2011-12-09 MED ORDER — DOPAMINE-DEXTROSE 3.2-5 MG/ML-% IV SOLN: 3.0000 ug/kg/min | INTRAVENOUS | Status: DC

## 2011-12-09 MED ORDER — ETOMIDATE 2 MG/ML IV SOLN
INTRAVENOUS | Status: DC | PRN
  Administered 2011-12-09: 14 mg via INTRAVENOUS

## 2011-12-09 MED ORDER — PHENYLEPHRINE HCL 10 MG/ML IJ SOLN
10.0000 mg | INTRAVENOUS | Status: DC | PRN
  Administered 2011-12-09: 40 ug/min via INTRAVENOUS

## 2011-12-09 MED ORDER — LISINOPRIL 20 MG PO TABS: 20.0000 mg | ORAL_TABLET | Freq: Every day | ORAL | Status: DC

## 2011-12-09 MED ORDER — AMITRIPTYLINE HCL 10 MG PO TABS
10.0000 mg | ORAL_TABLET | Freq: Every day | ORAL | Status: DC
  Filled 2011-12-09: qty 1

## 2011-12-09 MED ORDER — MORPHINE SULFATE 2 MG/ML IJ SOLN: 0.0500 mg/kg | INTRAMUSCULAR | Status: DC | PRN

## 2011-12-09 MED ORDER — ONDANSETRON HCL 4 MG/2ML IJ SOLN
4.0000 mg | Freq: Four times a day (QID) | INTRAMUSCULAR | Status: DC | PRN
  Administered 2011-12-11: 4 mg via INTRAVENOUS
  Filled 2011-12-09: qty 2

## 2011-12-09 MED ORDER — PROTAMINE SULFATE 10 MG/ML IV SOLN
INTRAVENOUS | Status: DC | PRN
  Administered 2011-12-09: 60 mg via INTRAVENOUS

## 2011-12-09 MED ORDER — DOCUSATE SODIUM 100 MG PO CAPS: 100.0000 mg | ORAL_CAPSULE | Freq: Every day | ORAL | Status: DC

## 2011-12-09 MED ORDER — GLYCOPYRROLATE 0.2 MG/ML IJ SOLN
INTRAMUSCULAR | Status: DC | PRN
  Administered 2011-12-09: .5 mg via INTRAVENOUS

## 2011-12-09 MED ORDER — TRAMADOL HCL 50 MG PO TABS: 50.0000 mg | ORAL_TABLET | Freq: Four times a day (QID) | ORAL | Status: DC | PRN

## 2011-12-09 MED ORDER — METOPROLOL TARTRATE 1 MG/ML IV SOLN: 2.0000 mg | INTRAVENOUS | Status: DC | PRN

## 2011-12-09 MED ORDER — SUFENTANIL CITRATE 50 MCG/ML IV SOLN
INTRAVENOUS | Status: DC | PRN
  Administered 2011-12-09: 5 ug via INTRAVENOUS
  Administered 2011-12-09: 20 ug via INTRAVENOUS
  Administered 2011-12-09: 15 ug via INTRAVENOUS

## 2011-12-09 MED ORDER — INSULIN ASPART 100 UNIT/ML ~~LOC~~ SOLN
0.0000 [IU] | Freq: Three times a day (TID) | SUBCUTANEOUS | Status: DC
  Filled 2011-12-09: qty 3

## 2011-12-09 MED ORDER — HYDRALAZINE HCL 20 MG/ML IJ SOLN
10.0000 mg | INTRAMUSCULAR | Status: DC | PRN
  Filled 2011-12-09: qty 0.5

## 2011-12-09 MED ORDER — DEXTROSE 5 % IV SOLN
1.5000 g | Freq: Once | INTRAVENOUS | Status: AC
  Administered 2011-12-09: 1.5 g via INTRAVENOUS
  Filled 2011-12-09 (×2): qty 1.5

## 2011-12-09 MED ORDER — GLIPIZIDE ER 10 MG PO TB24: 10.0000 mg | ORAL_TABLET | Freq: Two times a day (BID) | ORAL | Status: DC

## 2011-12-09 MED ORDER — ASPIRIN EC 325 MG PO TBEC
325.0000 mg | DELAYED_RELEASE_TABLET | Freq: Every day | ORAL | Status: DC
  Filled 2011-12-09: qty 1

## 2011-12-09 MED ORDER — PHENYLEPHRINE HCL 10 MG/ML IJ SOLN
10.0000 mg | INTRAVENOUS | Status: DC | PRN
  Administered 2011-12-09: 80 ug via INTRAVENOUS

## 2011-12-09 MED ORDER — ONDANSETRON HCL 4 MG/2ML IJ SOLN
INTRAMUSCULAR | Status: DC | PRN
  Administered 2011-12-09: 4 mg via INTRAVENOUS

## 2011-12-09 MED ORDER — NEOSTIGMINE METHYLSULFATE 1 MG/ML IJ SOLN
INTRAMUSCULAR | Status: DC | PRN
  Administered 2011-12-09: 3 mg via INTRAVENOUS

## 2011-12-09 MED ORDER — ASPIRIN 300 MG RE SUPP
300.0000 mg | Freq: Every day | RECTAL | Status: DC
  Filled 2011-12-09: qty 1

## 2011-12-09 MED ORDER — SODIUM CHLORIDE 0.9 % IV SOLN: 500.0000 mL | Freq: Once | INTRAVENOUS | Status: AC | PRN

## 2011-12-09 MED ORDER — INSULIN ASPART 100 UNIT/ML ~~LOC~~ SOLN
0.0000 [IU] | SUBCUTANEOUS | Status: DC
  Administered 2011-12-09: 8 [IU] via SUBCUTANEOUS
  Administered 2011-12-09: 5 [IU] via SUBCUTANEOUS
  Administered 2011-12-10 (×2): 3 [IU] via SUBCUTANEOUS
  Administered 2011-12-10: 5 [IU] via SUBCUTANEOUS
  Administered 2011-12-10: 3 [IU] via SUBCUTANEOUS
  Administered 2011-12-10: 5 [IU] via SUBCUTANEOUS
  Administered 2011-12-10: 2 [IU] via SUBCUTANEOUS
  Administered 2011-12-11 (×4): 3 [IU] via SUBCUTANEOUS
  Administered 2011-12-11: 5 [IU] via SUBCUTANEOUS
  Administered 2011-12-11 – 2011-12-13 (×10): 3 [IU] via SUBCUTANEOUS
  Administered 2011-12-13: 5 [IU] via SUBCUTANEOUS
  Administered 2011-12-13 (×2): 3 [IU] via SUBCUTANEOUS
  Administered 2011-12-13: 5 [IU] via SUBCUTANEOUS
  Administered 2011-12-14: 8 [IU] via SUBCUTANEOUS
  Administered 2011-12-14 (×2): 3 [IU] via SUBCUTANEOUS
  Administered 2011-12-14: 8 [IU] via SUBCUTANEOUS
  Filled 2011-12-09: qty 3

## 2011-12-09 MED ORDER — SUFENTANIL CITRATE 50 MCG/ML IV SOLN
INTRAVENOUS | Status: DC | PRN
  Administered 2011-12-09: 20 ug via INTRAVENOUS

## 2011-12-09 MED ORDER — HEPARIN SOD (PORCINE) IN D5W 100 UNIT/ML IV SOLN
1800.0000 [IU]/h | INTRAVENOUS | Status: DC
  Administered 2011-12-09: 1000 [IU]/h via INTRAVENOUS
  Filled 2011-12-09 (×2): qty 250

## 2011-12-09 MED ORDER — SODIUM CHLORIDE 0.9 % IR SOLN
Status: DC | PRN
  Administered 2011-12-09: 12:00:00

## 2011-12-09 MED ORDER — ROCURONIUM BROMIDE 100 MG/10ML IV SOLN
INTRAVENOUS | Status: DC | PRN
  Administered 2011-12-09: 50 mg via INTRAVENOUS

## 2011-12-09 MED ORDER — METFORMIN HCL 500 MG PO TABS
1000.0000 mg | ORAL_TABLET | Freq: Two times a day (BID) | ORAL | Status: DC
  Filled 2011-12-09: qty 2

## 2011-12-09 MED ORDER — HYDROMORPHONE HCL PF 1 MG/ML IJ SOLN
0.2500 mg | INTRAMUSCULAR | Status: DC | PRN
  Administered 2011-12-09: 0.25 mg via INTRAVENOUS

## 2011-12-09 MED ORDER — LACTATED RINGERS IV SOLN
INTRAVENOUS | Status: DC | PRN
  Administered 2011-12-09: 08:00:00 via INTRAVENOUS

## 2011-12-09 MED ORDER — ONDANSETRON HCL 4 MG/2ML IJ SOLN
4.0000 mg | Freq: Once | INTRAMUSCULAR | Status: AC | PRN
  Administered 2011-12-09: 4 mg via INTRAVENOUS

## 2011-12-09 MED ORDER — PHENOL 1.4 % MT LIQD: 1.0000 | OROMUCOSAL | Status: DC | PRN

## 2011-12-09 MED ORDER — SODIUM CHLORIDE 0.9 % IV SOLN
INTRAVENOUS | Status: DC
  Administered 2011-12-09: 100 mL/h via INTRAVENOUS
  Administered 2011-12-10: 100 mL via INTRAVENOUS
  Administered 2011-12-11 – 2011-12-13 (×3): via INTRAVENOUS

## 2011-12-09 MED ORDER — DEXAMETHASONE SODIUM PHOSPHATE 4 MG/ML IJ SOLN
INTRAMUSCULAR | Status: DC | PRN
  Administered 2011-12-09: 10 mg via INTRAVENOUS

## 2011-12-09 MED ORDER — POTASSIUM CHLORIDE CRYS ER 20 MEQ PO TBCR: 20.0000 meq | EXTENDED_RELEASE_TABLET | Freq: Once | ORAL | Status: AC | PRN

## 2011-12-09 MED ORDER — BIOTENE DRY MOUTH MT LIQD
15.0000 mL | Freq: Two times a day (BID) | OROMUCOSAL | Status: DC
Start: 2011-12-09 — End: 2011-12-14
  Administered 2011-12-09 – 2011-12-14 (×10): 15 mL via OROMUCOSAL

## 2011-12-09 MED ORDER — SODIUM CHLORIDE 0.9 % IR SOLN
Status: DC | PRN
  Administered 2011-12-09: 200 mL

## 2011-12-09 MED ORDER — PROPOFOL 10 MG/ML IV EMUL
INTRAVENOUS | Status: DC | PRN
  Administered 2011-12-09: 125 mg via INTRAVENOUS

## 2011-12-09 MED ORDER — HEPARIN SODIUM (PORCINE) 1000 UNIT/ML IJ SOLN
INTRAMUSCULAR | Status: DC | PRN
  Administered 2011-12-09: 3 mL via INTRAVENOUS
  Administered 2011-12-09: 1 mL via INTRAVENOUS
  Administered 2011-12-09: 2 mL via INTRAVENOUS
  Administered 2011-12-09: 6 mL via INTRAVENOUS

## 2011-12-09 MED ORDER — HEPARIN SODIUM (PORCINE) 1000 UNIT/ML IJ SOLN
INTRAMUSCULAR | Status: DC | PRN
  Administered 2011-12-09: 6000 [IU] via INTRAVENOUS
  Administered 2011-12-09: 2000 [IU] via INTRAVENOUS

## 2011-12-09 MED ORDER — LIDOCAINE HCL 4 % MT SOLN
OROMUCOSAL | Status: DC | PRN
  Administered 2011-12-09: 4 mL via TOPICAL

## 2011-12-09 MED ORDER — GUAIFENESIN-DM 100-10 MG/5ML PO SYRP: 15.0000 mL | ORAL_SOLUTION | ORAL | Status: DC | PRN

## 2011-12-09 SURGICAL SUPPLY — 52 items
ADH SKN CLS APL DERMABOND .7 (GAUZE/BANDAGES/DRESSINGS) ×2
CANISTER SUCTION 2500CC (MISCELLANEOUS) ×3 IMPLANT
CATH EMB 3FR 40CM (CATHETERS) ×2 IMPLANT
CATH ROBINSON RED A/P 18FR (CATHETERS) ×3 IMPLANT
CATH SUCT 10FR WHISTLE TIP (CATHETERS) ×3 IMPLANT
CLIP TI MEDIUM 24 (CLIP) ×1 IMPLANT
CLIP TI WIDE RED SMALL 24 (CLIP) ×1 IMPLANT
CLOTH BEACON ORANGE TIMEOUT ST (SAFETY) ×3 IMPLANT
COVER SURGICAL LIGHT HANDLE (MISCELLANEOUS) ×6 IMPLANT
CRADLE DONUT ADULT HEAD (MISCELLANEOUS) ×3 IMPLANT
DECANTER SPIKE VIAL GLASS SM (MISCELLANEOUS) IMPLANT
DERMABOND ADVANCED (GAUZE/BANDAGES/DRESSINGS) ×1
DERMABOND ADVANCED .7 DNX12 (GAUZE/BANDAGES/DRESSINGS) ×1 IMPLANT
DRAIN HEMOVAC 1/8 X 5 (WOUND CARE) ×2 IMPLANT
DRAPE WARM FLUID 44X44 (DRAPE) ×3 IMPLANT
DRSG COVADERM 4X8 (GAUZE/BANDAGES/DRESSINGS) ×4 IMPLANT
ELECT REM PT RETURN 9FT ADLT (ELECTROSURGICAL) ×3
ELECTRODE REM PT RTRN 9FT ADLT (ELECTROSURGICAL) ×2 IMPLANT
EVACUATOR SILICONE 100CC (DRAIN) IMPLANT
GAUZE SPONGE 4X4 12PLY STRL LF (GAUZE/BANDAGES/DRESSINGS) ×2 IMPLANT
GLOVE BIO SURGEON STRL SZ 6.5 (GLOVE) ×2 IMPLANT
GLOVE BIOGEL PI IND STRL 6.5 (GLOVE) ×2 IMPLANT
GLOVE BIOGEL PI IND STRL 7.0 (GLOVE) ×1 IMPLANT
GLOVE BIOGEL PI IND STRL 7.5 (GLOVE) ×1 IMPLANT
GLOVE BIOGEL PI INDICATOR 6.5 (GLOVE) ×2
GLOVE BIOGEL PI INDICATOR 7.0 (GLOVE) ×1
GLOVE BIOGEL PI INDICATOR 7.5 (GLOVE) ×1
GLOVE ECLIPSE 6.5 STRL STRAW (GLOVE) ×2 IMPLANT
GLOVE SS BIOGEL STRL SZ 7 (GLOVE) ×2 IMPLANT
GLOVE SS N UNI LF 7.5 STRL (GLOVE) ×2 IMPLANT
GLOVE SUPERSENSE BIOGEL SZ 7 (GLOVE) ×1
GOWN STRL NON-REIN LRG LVL3 (GOWN DISPOSABLE) ×6 IMPLANT
INSERT FOGARTY SM (MISCELLANEOUS) ×3 IMPLANT
KIT BASIN OR (CUSTOM PROCEDURE TRAY) ×3 IMPLANT
KIT ROOM TURNOVER OR (KITS) ×3 IMPLANT
NEEDLE 22X1 1/2 (OR ONLY) (NEEDLE) IMPLANT
NS IRRIG 1000ML POUR BTL (IV SOLUTION) ×6 IMPLANT
PACK CAROTID (CUSTOM PROCEDURE TRAY) ×3 IMPLANT
PAD ARMBOARD 7.5X6 YLW CONV (MISCELLANEOUS) ×6 IMPLANT
SHUNT CAROTID BYPASS 12FRX15.5 (VASCULAR PRODUCTS) IMPLANT
SPECIMEN JAR SMALL (MISCELLANEOUS) ×1 IMPLANT
SUT PROLENE 6 0 C 1 30 (SUTURE) ×4 IMPLANT
SUT PROLENE 6 0 CC (SUTURE) ×3 IMPLANT
SUT SILK 2 0 FS (SUTURE) ×3 IMPLANT
SUT VIC AB 2-0 CT1 27 (SUTURE) ×3
SUT VIC AB 2-0 CT1 TAPERPNT 27 (SUTURE) ×2 IMPLANT
SUT VIC AB 3-0 X1 27 (SUTURE) ×3 IMPLANT
SYR CONTROL 10ML LL (SYRINGE) IMPLANT
SYR TB 1ML LUER SLIP (SYRINGE) ×2 IMPLANT
TOWEL OR 17X24 6PK STRL BLUE (TOWEL DISPOSABLE) ×3 IMPLANT
TOWEL OR 17X26 10 PK STRL BLUE (TOWEL DISPOSABLE) ×3 IMPLANT
WATER STERILE IRR 1000ML POUR (IV SOLUTION) ×3 IMPLANT

## 2011-12-09 SURGICAL SUPPLY — 49 items
ADH SKN CLS APL DERMABOND .7 (GAUZE/BANDAGES/DRESSINGS) ×1
CANISTER SUCTION 2500CC (MISCELLANEOUS) ×2 IMPLANT
CATH ROBINSON RED A/P 18FR (CATHETERS) ×2 IMPLANT
CATH SUCT 10FR WHISTLE TIP (CATHETERS) ×2 IMPLANT
CLIP TI MEDIUM 24 (CLIP) ×2 IMPLANT
CLIP TI WIDE RED SMALL 24 (CLIP) ×2 IMPLANT
CLOTH BEACON ORANGE TIMEOUT ST (SAFETY) ×2 IMPLANT
COVER SURGICAL LIGHT HANDLE (MISCELLANEOUS) ×4 IMPLANT
CRADLE DONUT ADULT HEAD (MISCELLANEOUS) ×2 IMPLANT
DECANTER SPIKE VIAL GLASS SM (MISCELLANEOUS) IMPLANT
DERMABOND ADVANCED (GAUZE/BANDAGES/DRESSINGS) ×1
DERMABOND ADVANCED .7 DNX12 (GAUZE/BANDAGES/DRESSINGS) IMPLANT
DRAIN HEMOVAC 1/8 X 5 (WOUND CARE) IMPLANT
DRAPE WARM FLUID 44X44 (DRAPE) ×2 IMPLANT
DRSG COVADERM 4X8 (GAUZE/BANDAGES/DRESSINGS) ×1 IMPLANT
ELECT REM PT RETURN 9FT ADLT (ELECTROSURGICAL) ×2
ELECTRODE REM PT RTRN 9FT ADLT (ELECTROSURGICAL) ×1 IMPLANT
EVACUATOR SILICONE 100CC (DRAIN) IMPLANT
GLOVE BIO SURGEON STRL SZ 6.5 (GLOVE) ×1 IMPLANT
GLOVE BIOGEL PI IND STRL 6.5 (GLOVE) IMPLANT
GLOVE BIOGEL PI IND STRL 7.0 (GLOVE) IMPLANT
GLOVE BIOGEL PI IND STRL 7.5 (GLOVE) IMPLANT
GLOVE BIOGEL PI INDICATOR 6.5 (GLOVE) ×1
GLOVE BIOGEL PI INDICATOR 7.0 (GLOVE) ×2
GLOVE BIOGEL PI INDICATOR 7.5 (GLOVE) ×1
GLOVE ECLIPSE 6.5 STRL STRAW (GLOVE) ×1 IMPLANT
GLOVE SS BIOGEL STRL SZ 7 (GLOVE) ×1 IMPLANT
GLOVE SUPERSENSE BIOGEL SZ 7 (GLOVE) ×1
GOWN STRL NON-REIN LRG LVL3 (GOWN DISPOSABLE) ×4 IMPLANT
INSERT FOGARTY SM (MISCELLANEOUS) ×2 IMPLANT
KIT BASIN OR (CUSTOM PROCEDURE TRAY) ×2 IMPLANT
KIT ROOM TURNOVER OR (KITS) ×2 IMPLANT
NEEDLE 22X1 1/2 (OR ONLY) (NEEDLE) IMPLANT
NS IRRIG 1000ML POUR BTL (IV SOLUTION) ×4 IMPLANT
PACK CAROTID (CUSTOM PROCEDURE TRAY) ×2 IMPLANT
PAD ARMBOARD 7.5X6 YLW CONV (MISCELLANEOUS) ×4 IMPLANT
PATCH HEMASHIELD 8X75 (Vascular Products) ×1 IMPLANT
SHUNT CAROTID BYPASS 12FRX15.5 (VASCULAR PRODUCTS) IMPLANT
SPECIMEN JAR SMALL (MISCELLANEOUS) ×2 IMPLANT
SUT PROLENE 6 0 C 1 24 (SUTURE) ×1 IMPLANT
SUT PROLENE 6 0 CC (SUTURE) ×3 IMPLANT
SUT SILK 2 0 FS (SUTURE) ×2 IMPLANT
SUT VIC AB 2-0 CT1 27 (SUTURE) ×2
SUT VIC AB 2-0 CT1 TAPERPNT 27 (SUTURE) ×1 IMPLANT
SUT VIC AB 3-0 X1 27 (SUTURE) ×2 IMPLANT
SYR CONTROL 10ML LL (SYRINGE) IMPLANT
TOWEL OR 17X24 6PK STRL BLUE (TOWEL DISPOSABLE) ×2 IMPLANT
TOWEL OR 17X26 10 PK STRL BLUE (TOWEL DISPOSABLE) ×2 IMPLANT
WATER STERILE IRR 1000ML POUR (IV SOLUTION) ×2 IMPLANT

## 2011-12-09 NOTE — Anesthesia Postprocedure Evaluation (Signed)
  Anesthesia Post-op Note  Patient: TEMITAYO COVALT  Procedure(s) Performed: Procedure(s) (LRB): WOUND EXPLORATION (Left)  Patient Location: PACU  Anesthesia Type: General  Level of Consciousness: lethargic  Airway and Oxygen Therapy: Patient Spontanous Breathing and Patient connected to nasal cannula oxygen  Post-op Pain: none  Post-op Assessment: Post-op Vital signs reviewed, Patient's Cardiovascular Status Stable, Respiratory Function Stable, Patent Airway, No signs of Nausea or vomiting and Pain level controlled  Post-op Vital Signs: Reviewed and stable  Complications: No apparent anesthesia complications

## 2011-12-09 NOTE — Progress Notes (Signed)
Reason for Consult: apparent hypercoagulable state with thrombosis of new carotid endarterectomy site Referring Physician: JDLawson  MCKALE HAFFEY is an 66 y.o. male seen in consultation at request of Dr.Lawson following acute thrombosis of left carotid endarterectomy with resultant neurologic deficits.  He has no history of previous thrombotic events. Patient is followed primarily by Dirk Dress for type 2 diabetes and hypertension.  He had been in usual generally good health until vision change OD with dizziness in early Feb 2013. He was found to have 90 - 95% left common carotid artery stenosis and referred to [facility]. There was no evidence of CVA. He was taken to extensive left carotid endarterectomy with dacron patch angioplasty this am, with plaque ~ 90% stenotic, that procedure uncomplicated. In recovery he had initial normal neu;rologic exam, then sudden onset of decreased responsiveness and no movement on right side. He was taken emergently back to OR, with finding of thrombosis of entire endarterectomy site. The thrombus was removed and there were no technical problems of the surgical site. Despite initial heparin 7000 units followed by 2000 units followed by 3000 units, the ACT never exceeded 150. The endarterectomy segment was resected and replaced with saphenous vein graft. The heparin was DCd at 1414 per EMR. Neurology consult is in process and patient is seen prior to being taken for MRI brain now; wife and 2 family members are at bedside.  Review of Systems: family tells me that he is more responsive now than earlier. No recent infectious illness, no bleeding problems, some slight intentional weight loss recently, no other complaints known PTA than as given above.     Allergies:  Allergies  Allergen Reactions  . Oxycodone-Acetaminophen     REACTION: feels sick   Past Medical History  Diagnosis Date  . Diabetes mellitus   . Sleep apnea     cpap      sleep study > 4 yrs    . Hypertension     dr Tawanna Cooler    Past Surgical History  Procedure Date  . Hernia repair   . Heel spur surgery   . Heer surgery    Per EMR, umbilical hernia repair 2002 and epigastric hernia repair 2008 by Dr. Jamey Ripa, uncomplicated per wife. Ankle surgery ~ 10 years ago, uncomplicated per family.  Family History  Problem Relation Age of Onset  . Heart disease Father   no coagulation problems known in family.  Social History:  reports that he quit smoking about 10 years ago. His smoking use included Cigarettes. He has a 75 pack-year smoking history. He has never used smokeless tobacco. He reports that he drinks alcohol. He reports that he does not use illicit drugs.Married  Medications: I have reviewed the patient's current medications.  Blood pressure 155/84, pulse 87, temperature 96.8 F (36 C), temperature source Axillary, resp. rate 25, height 5' 10.87" (1.8 m), weight 244 lb 7.8 oz (110.9 kg), SpO2 96.00%. Somewhat overweight WM appears stated age, awake, no verbal responses, seems to follow and respond to family. Dressing dry on left neck. No peripheral adenopathy. Lungs without wheezes/ rales. IV sites ok. Cor RRR, no murmur appreciated. Abdomen soft, quiet, no appreciable HSM. LE without edema, cords. Moving LUE, LLE in bed. No obvious skin lesions.   Preoperative labs 12-08-11:  CBC with WBC 8.2, Hgb 13.4, plt 243, differential normal. PT 14.1, INR 1.07, PTT 40. CMET normal except glucose 133.    Results for orders placed during the hospital encounter of 12/09/11 (from the past  48 hour(s))  GLUCOSE, CAPILLARY     Status: Abnormal   Collection Time   12/09/11  6:38 AM      Component Value Range Comment   Glucose-Capillary 167 (*) 70 - 99 (mg/dL)   GLUCOSE, CAPILLARY     Status: Abnormal   Collection Time   12/09/11 11:09 AM      Component Value Range Comment   Glucose-Capillary 187 (*) 70 - 99 (mg/dL)   GLUCOSE, CAPILLARY     Status: Abnormal   Collection Time   12/09/11  11:45 AM      Component Value Range Comment   Glucose-Capillary 206 (*) 70 - 99 (mg/dL)   APTT     Status: Abnormal   Collection Time   12/09/11 12:48 PM      Component Value Range Comment   aPTT 61 (*) 24 - 37 (seconds)   PROTIME-INR     Status: Normal   Collection Time   12/09/11 12:48 PM      Component Value Range Comment   Prothrombin Time 15.0  11.6 - 15.2 (seconds)    INR 1.16  0.00 - 1.49    GLUCOSE, CAPILLARY     Status: Abnormal   Collection Time   12/09/11  2:24 PM      Component Value Range Comment   Glucose-Capillary 238 (*) 70 - 99 (mg/dL)   GLUCOSE, CAPILLARY     Status: Abnormal   Collection Time   12/09/11  4:21 PM      Component Value Range Comment   Glucose-Capillary 267 (*) 70 - 99 (mg/dL)    Comment 1 Documented in Chart      Comment 2 Notify RN       Dg Chest 2 View  12/08/2011  *RADIOLOGY REPORT*  Clinical Data: Hypertension, carotid stenosis, diabetes, sleep apnea, former smoker preoperative assessment for carotid endarterectomy  CHEST - 2 VIEW  Comparison: 04/07/2010  Findings: Enlargement of cardiac silhouette. Tortuous aorta. Pulmonary vascularity normal. Chronic peribronchial thickening. No pulmonary infiltrate, pleural effusion, or pneumothorax. Minimal end plate spur formation thoracic spine.  IMPRESSION: Enlargement of cardiac silhouette. Chronic bronchitic changes.  Original Report Authenticated By: Lollie Marrow, M.D.   Ct Head Wo Contrast  12/09/2011  *RADIOLOGY REPORT*  Clinical Data: Right-sided weakness.  Recovering from left carotid surgery.  CT HEAD WITHOUT CONTRAST  Technique:  Contiguous axial images were obtained from the base of the skull through the vertex without contrast.  Comparison: None.  Findings: The brain shows mild age related atrophy.  There are mild changes of small vessel disease in the deep white matter.  There are a few old lacunar infarctions in the basal ganglia regions.  No identifiably acute infarction, mass lesion, hemorrhage,  hydrocephalus or extra-axial collection.  Atherosclerosis effects the major vessels at the base of the brain.  Calvarium is unremarkable.  Sinuses are clear.  IMPRESSION: No acute or reversible findings.  Age related atrophy.  Mild chronic small vessel changes as outlined above.  Atherosclerosis.  Original Report Authenticated By: Thomasenia Sales, M.D.    I have discussed with my partner.  Situation suggests antiphospholipid antibody or AT III deficiency. I have ordered stat antithrombin level, which could be low from earlier heparin, and heparin level now, as well as lupus anticoagulant panel and others. Results of MRI are pending. If appropriate from standpoint of MRI results to resume anticoagulation, could try pushing heparin dose following levels or antithrombin concentrate if AT III seems likely.  Assessment/Plan: 1.Acute thrombosis of endarterectomy today, with heparin resistance noted intraoperatively:  Stat labs pending, MRI results pending.  2.Neurologic deficits since the acute thrombosis 3.Peripheral vascular disease, with symptomatic severe left carotid stenosis preoperatively 4.Diabetes 5.HTN 6.long past tobacco  Will follow with you.  Saraphina Lauderbaugh P 12/09/2011, 5:47 PM

## 2011-12-09 NOTE — H&P (View-Only) (Signed)
Subjective:     Patient ID: Darrell [name], male   DOB: April 03, 1946, 66 y.o.   MRN: [id number]  HPI this 66 year old male patient developed acute onset of blurred vision in the right eye with some dizziness about 10 days ago. He had donated blood which is usual routine every 3 months. That was done on January 31. He denies any history of previous stroke or lateralizing weakness, aphasia, amaurosis fugax, diplopia, blurred vision, or syncope. Following this event he was seen by Dr. Dione Booze and Dr. Luciana Axe who discovered that he had severe circulation problems he had no evidence of stroke according to their evaluation. Carotid duplex exam was ordered and revealed a 90-95% left common carotid artery stenosis and mild internal carotid flow reduction on the contralateral right side. I have reviewed that study and agree with those findings. He does not describe loss of entire visual field or upper quadrant but blurred vision throughout on the right side with no effect of his left visual field  Past Medical History  Diagnosis Date  . Sleep apnea   . Hypertension   . Diabetes mellitus     History  Substance Use Topics  . Smoking status: Former Smoker -- 2.5 packs/day for 30 years    Types: Cigarettes    Quit date: 12/07/2001  . Smokeless tobacco: Never Used  . Alcohol Use: Yes    Family History  Problem Relation Age of Onset  . Heart disease Father     Allergies  Allergen Reactions  . Oxycodone-Acetaminophen     REACTION: feels sick    Current outpatient prescriptions:amitriptyline (ELAVIL) 10 MG tablet, Take 1 tablet (10 mg total) by mouth at bedtime., Disp: 100 tablet, Rfl: 3;  aspirin 81 MG EC tablet, Take 81 mg by mouth daily.  , Disp: , Rfl: ;  fenofibrate micronized (LOFIBRA) 200 MG capsule, Take 1 capsule (200 mg total) by mouth daily before breakfast., Disp: 100 capsule, Rfl: 3;  glipiZIDE (GLIPIZIDE XL) 10 MG 24 hr tablet, 1 by mouth twice a day, Disp: 200 tablet, Rfl:  3 hydrocortisone (WESTCORT) 0.2 % cream, , Disp: , Rfl: ;  lisinopril (PRINIVIL,ZESTRIL) 20 MG tablet, Take 1 tablet (20 mg total) by mouth daily., Disp: 100 tablet, Rfl: 3;  metFORMIN (GLUCOPHAGE) 1000 MG tablet, Take 1 tablet (1,000 mg total) by mouth 2 (two) times daily with a meal. One tab in the morning and half tab in the evening, Disp: 200 tablet, Rfl: 3;  ONE TOUCH ULTRA TEST test strip, , Disp: , Rfl:  DISCONTD: metFORMIN (GLUCOPHAGE) 500 MG tablet, One half tablet in the morning prior to breakfast, Disp: 100 tablet, Rfl: 3;  DISCONTD: metFORMIN (GLUCOPHAGE) 500 MG tablet, Take 1 tablet (500 mg total) by mouth daily with breakfast., Disp: 90 tablet, Rfl: 3  BP 151/91  Pulse 102  Resp 20  Ht 5\' 11"  (1.803 m)  Wt 241 lb (109.317 kg)  BMI 33.61 kg/m2  Body mass index is 33.61 kg/(m^2).         Review of Systems denies chest pain, dyspnea on exertion, does complain of easy fatigability. Has history of sleep apnea for 10 years of problems with recurrent umbilical hernia and a fracture of 1 foot. All other systems are negative and complete review of systems     Objective:   Physical Exam blood pressure 151/91 heart rate 102 respirations 20 General obese male in no apparent distress alert and oriented x3 Lungs no rhonchi or wheezing HEENT normal for age Cardiovascular  rate rhythm no murmurs carotid pulses 3+ is a very soft bruit on the right in a more harsh bruit on the left. Neurologic exam slight decreased vision grossly in the right eye Musto skillful free much deformity Abdomen obese no palpable masses Skin free of rashes Lower extremities 3+ femoral popliteal dorsalis pedis pulses palpable bilaterally with no edema.     Assessment:     Severe left carotid occlusive disease with recent visual loss right eye which has not returned    Plan:     Recommend left carotid endarterectomy  Risks and benefits of this have been thoroughly discussed with patient and will plan to  proceed tomorrow Will check blood work today to be certain hemoglobin has recovered from recent blood donor event

## 2011-12-09 NOTE — Transfer of Care (Signed)
Immediate Anesthesia Transfer of Care Note  Patient: Darrell [name]  Procedure(s) Performed: Procedure(s) (LRB): ENDARTERECTOMY CAROTID (Left)  Patient Location: PACU  Anesthesia Type: General  Level of Consciousness: awake, alert  and oriented  Airway & Oxygen Therapy: Patient Spontanous Breathing and Patient connected to nasal cannula oxygen  Post-op Assessment: Report given to PACU RN, Post -op Vital signs reviewed and stable, Patient moving all extremities and Patient able to stick tongue midline  Post vital signs: Reviewed and stable  Complications: No apparent anesthesia complications

## 2011-12-09 NOTE — Op Note (Signed)
OPERATIVE REPORT  Date of Surgery: 12/09/2011  Surgeon: [name], MD  Assistant: Clearence Ped  Pre-op Diagnosis: Severe Left Common carotid stenosis with recent visual changes right eye  Post-op Diagnosis: Same Procedure: Procedure(s): Extensive left carotid endarterectomy with dacron  patch angioplasty  Anesthesia: General  EBL: 100 cc  Complications: None  Procedure Details: The patient was taken to the operating room and placed in the supine position. Following induction of satisfactory general endotracheal anesthesia the left neck was prepped and draped in a routine sterile manner. Incision was made on the anterior border of the sternocleidomastoid muscle and carried down through the subcutaneous tissue and platysma using the Bovie. Care was taken not to injure the hypoglossal nerve.The carotid bifurcation was quite high requiring mobilization of the hypoglossal nerve. The common internal and external carotid arteries were dissected free. There was a calcified atherosclerotic plaque at the carotid bifurcation extending up the internal carotid artery.There was also a severely calcified plaque in the midportion of the common carotid artery and dissection was continued proximal to this point. This required division of the omohyoid muscle. A #10 shunt was then prepared and the patient was heparinized. The carotid vessels were occluded with vascular clamps. A longitudinal opening was made in the common carotid with a 15 blade extended up the internal carotid with the Potts scissors to a point distal to the disease. The plaque was approximately 90%% stenotic in severity. The distal vessel appeared normal. Shunt was inserted without difficulty reestablishing flow in about 2 minutes. A standard endarterectomy was performed with an eversion endarterectomy of the external carotid. The plaque feathered off  the distal internal carotid artery nicely not requiring any tacking sutures. The lumen  was thoroughly irrigated with heparinized saline and loose debris all carefully removed. The arterotomy was then closedPrimarily up to a point just proximal to the bifurcation. The patch was then sewn in place of the internal carotid artery.This was done with 6-0 Prolene. Prior to completion of the  Closure the  shunt was removed after approximately 50 minutes of shunt time. Flow was then reestablished up the external branch initially followed by the internal branch. Protamine was given to her reverse the heparin.Following adequate hemostasis the wound was irrigated with saline and closed in layers with Vicryl ain a subcuticular fashion. Sterile dressing was applied and the patient taken to the recovery room in stable condition.  [name], MD 12/09/2011 11:00 AM

## 2011-12-09 NOTE — Anesthesia Postprocedure Evaluation (Signed)
  Anesthesia Post-op Note  Patient: Darrell [name]  Procedure(s) Performed: Procedure(s) (LRB): ENDARTERECTOMY CAROTID (Left)  Patient Location: PACU  Anesthesia Type: General  Level of Consciousness: lethargic  Airway and Oxygen Therapy: Patient Spontanous Breathing and Patient connected to face mask oxygen  Post-op Pain: none  Post-op Assessment: Post-op Vital signs reviewed  Post-op Vital Signs: unstable  Patient with new onset neuro deficits in PACU. Dr. Hart Rochester at bedside. Patient to return to OR for emergent exploration of carotid artery.  Complications: No apparent anesthesia complications

## 2011-12-09 NOTE — Progress Notes (Signed)
Report given to [employer], consent signed by wife, pt returned to OR

## 2011-12-09 NOTE — Progress Notes (Addendum)
Dr. Hart Rochester at bedside, aware about pt. only moving L side, opens eyes when name is called.  Patient was initially awake responsive with a normal neurologic exam in the recovery room following surgery. Nurse called me stating that over a five-minute period he became obtunded and unresponsive. On examination his blood pressure is 130/80. He is not moving his right side. He does have movement in his left side spontaneously. He does not follow commands. There is no evidence of hematoma in the left neck.  Suspect thrombotic event at carotid endarterectomy site. Will return patient immediately to the OR for exploration. Patient's wife was present and notified of events. Has definitely had a left brain event-uncertain whether this will be a permanent event depending on findings and his postoperative course

## 2011-12-09 NOTE — Op Note (Signed)
OPERATIVE REPORT  Date of Surgery: 12/09/2011  Surgeon: [name], MD  Assistant: Clearence Ped and Newton Pigg  pa  Pre-op Diagnosis: Bring Back suspect thrombosis left carotid endarterectomy site-acute neurologic change  Post-op Diagnosis: thrombosis left carotid endarterctomy  site-etiology unknown-no technical problems identified Procedure: Procedure(s): Exploration left carotid endarterectomy site with thrombectomy of the endarterectomized surface and distal left internal carotid artery #2 resection left common and internal carotid artery with insertion of a non-reversed saphenous vein graft from left leg from common carotid to internal carotid artery with ligation left external carotid artery Anesthesia: General  EBL: 300 cc  Complications: None  Procedure Details: This patient had a left carotid endarterectomy performed earlier in the day. He initially had a normal neurologic exam in the PACU. He suddenly developed a neurologic change being somewhat obtunded and not responding to commands. He was moving his left side but no movement of the right side was noted. He was immediately return to the operating room for exploration of the endarterectomy site   The patient was immediately return to the operating room rapidly prepped and draped in routine sterile manner Foley catheter inserted and general anesthesia was reinduced. The left neck was re\re opened and the carotid endarterectomy site was exposed. It was noted to be totally thrombosed and pulseless from the proximal to the distal extent of the endarterectomy. It was a long endarterectomy in the common carotid down to the omohyoid muscle. 7000 units of heparin was given initially and the vessels all occluded with vascular clamps. A Dacron patch was removed and the long arterotomy down the common carotid was completely reopened. There was thrombus throughout the entire endarterectomy site. There was no thrombus in the native  vessel proximally and there was no significant thrombus in the vessel distally and there was fair backbleeding from the distal internal carotid artery. Initially all the thrombus removed from the endarterectomy site and a 3 Fogarty catheter was passed gently up the internal carotid artery about 5-10 cm. Upon return of very small piece of thrombotic debris was retrieved and excellent backbleeding was present. A #10 shunt was then inserted about 15 minutes after the beginning of the procedure. It was in place throughout. The endarterectomy site was carefully examined. There were no technical problems either in the intima proximally or distally or the endarterectomy site itself. ACTwas checked and the patient's was very resistant to heparin. He was given an additional 2000 units and then another additional 3000 units of heparin for a total of 12,000 units and his ACT never exceeded 150. It was felt the best plan would be to resect resect the endarterectomized segment and replace this with a saphenous vein graft. The greater saphenous vein was then harvested from the left inguinal area for about 10 cm branches ligated with 3 and 4-0 silk ties and divided it was removed gently dilated with heparinized saline and marked for orientation purposes. It was used in a non-reversed fashion. The vein. Proximal and the vein was slightly spatulated in the common carotid artery was slightly spatulated after transecting it at the proximal endarterectomy site. The proximal vein was anastomosed end-to-end with 6-0 Prolene leaving the shunt in place until the last 2 stitches were placed. When this was performed the shunt was removed and the distal end of internal carotid was transected again just above the previous endarterectomy site leaving a fresh smooth intima. The valves were lysed using a retrograde valvulotome It was slightly spatulated more distally the intima carefully examined  and there were no defects. The vein was carefully  measured spatulated and anastomosed end-to-end to the distal internal carotid artery with 6-0 Prolene. After antegrade and retrograde flushed this was completed clamps released there was an excellent pulse and Doppler flow in the distal internal carotid artery. Adequate hemostasis was achieved no further heparin was given and no protamine was given. 10 flat Jackson-Pratt drain was brought out inferiorly based stab wound and secured with a silk suture. Wound was closed in layers with Vicryl in a subcuticular fashion. The inguinal wound was closed in layers with Vicryl and skin clips and taken to the recovery room in stable condition. [name], MD 12/09/2011 2:01 PM

## 2011-12-09 NOTE — Progress Notes (Signed)
ANTICOAGULATION CONSULT NOTE - Initial Consult  Pharmacy Consult for Heparin Indication: probable clotting disorder  Allergies  Allergen Reactions  . Oxycodone-Acetaminophen     REACTION: feels sick    Patient Measurements: Height: 5' 10.87" (180 cm) Weight: 244 lb 7.8 oz (110.9 kg) IBW/kg (Calculated) : 74.99  Heparin Dosing Weight: 99kg  Vital Signs: Temp: 98.4 F (36.9 C) (02/13 2002) Temp src: Oral (02/13 2002) BP: 155/84 mmHg (02/13 1521) Pulse Rate: 87  (02/13 1521)  Labs:  Basename 12/09/11 1927 12/09/11 1248 12/08/11 1209  HGB 12.3* -- 13.4  HCT 36.7* -- 40.3  PLT 200 -- 243  APTT 29 61* 40*  LABPROT 13.9 15.0 14.1  INR 1.05 1.16 1.07  HEPARINUNFRC <0.10* -- --  CREATININE -- -- 1.07  CKTOTAL -- -- --  CKMB -- -- --  TROPONINI -- -- --   Estimated Creatinine Clearance: 85.9 ml/min (by C-G formula based on Cr of 1.07).  Medical History: Past Medical History  Diagnosis Date  . Diabetes mellitus   . Sleep apnea     cpap      sleep study > 4 yrs  . Hypertension     dr Tawanna Cooler    Medications:  Scheduled:    . antiseptic oral rinse  15 mL Mouth Rinse q12n4p  . aspirin EC  325 mg Oral Daily  . cefUROXime (ZINACEF)  IV  1.5 g Intravenous Once  . cefUROXime (ZINACEF)  IV  1.5 g Intravenous Q12H  . chlorhexidine  15 mL Mouth Rinse BID  . docusate sodium  100 mg Oral Daily  . HYDROmorphone      . insulin aspart  0-15 Units Subcutaneous Q4H  . DISCONTD: amitriptyline  10 mg Oral QHS  . DISCONTD: aspirin  300 mg Rectal Daily  . DISCONTD: glipiZIDE  10 mg Oral BID  . DISCONTD: insulin aspart  0-15 Units Subcutaneous TID WC  . DISCONTD: lisinopril  20 mg Oral Daily  . DISCONTD: metFORMIN  1,000 mg Oral BID WC    Assessment: 66 yo M s/p elective L CEA on 2/13.  Immediately post-op developed neurologic symptoms and found to have had thrombosis of surgical site.  Pt was taken back to OR for removal of thrombus.  Pt required large amounts of heparin intra-op  without much effect on ACT.   Discussed patient with Dr Darrold Span who wants to start heparin due to clot burden and potential for clotting disorder. Discussed risk of hemorrhagic transformation immediately post-CVA as well as potential role for DTI in this situation.  Will proceed with heparin per hematologist orders.  [place] [address] units of heparin intra-op.  HL checked prior to starting heparin.  Level undetectable.  Goal of Therapy:  Heparin level 0.3-0.5   Plan:  1) Heparin initially started at 1000 units/hr until HL resulted (started at 2038).  Will increase rate to 1400 units/hr now. 2) Check 8hr heparin level from initial hang 3) Check daily heparin level & CBC 4) F/U pt progress and results of coagulation factor labs.    Darrell [name] 12/09/2011,9:39 PM

## 2011-12-09 NOTE — Progress Notes (Signed)
Pt desats, sm applied, sats increased to 96%, pt with blank stare and not responding to commands, Pupils equal and reactive, Pt not moving right side, droop right side noted, Dr.Lawson notified, pt to return to OR

## 2011-12-09 NOTE — Consult Note (Addendum)
[facility] STROKE CONSULT NOTE       Chief Complaint: stroke   HPI:    Darrell [name] is an 66 y.o. male patient presented to [facility] for L CEA. Post operatively patient was noted to become unresponsive and not moving right side. Patient was brought back to the ED to further evaluation there was thrombus throughout the entire endarterectomy site.  This was removed and patient was brought back to PACU.  Neurology was ask to evaluate due to not moving right side, not able to follow commands and question of stroke.   LSN: 12:12 tPA Given: No: post operative    Past Medical History  Diagnosis Date  . Diabetes mellitus   . Sleep apnea     cpap      sleep study > 4 yrs  . Hypertension     dr Tawanna Cooler    Past Surgical History  Procedure Date  . Hernia repair   . Heel spur surgery   . Heer surgery     Family History  Problem Relation Age of Onset  . Heart disease Father    Social History:  reports that he quit smoking about 10 years ago. His smoking use included Cigarettes. He has a 75 pack-year smoking history. He has never used smokeless tobacco. He reports that he drinks alcohol. He reports that he does not use illicit drugs.  Allergies:  Allergies  Allergen Reactions  . Oxycodone-Acetaminophen     REACTION: feels sick    Medications:    Prior to Admission:  Prescriptions prior to admission  Medication Sig Dispense Refill  . amitriptyline (ELAVIL) 10 MG tablet Take 10 mg by mouth at bedtime.      [name] aspirin 81 MG EC tablet Take 81 mg by mouth daily.        . fenofibrate micronized (LOFIBRA) 200 MG capsule Take 200 mg by mouth daily before breakfast.      . glipiZIDE (GLUCOTROL XL) 10 MG 24 hr tablet Take 10 mg by mouth 2 (two) times daily.      [name] lisinopril (PRINIVIL,ZESTRIL) 20 MG tablet Take 20 mg by mouth daily.      . metFORMIN (GLUCOPHAGE) 1000 MG tablet Take 1,000 mg by mouth 2 (two) times daily with a meal.        Scheduled:   .  cefUROXime (ZINACEF)  IV  1.5 g Intravenous Once  . HYDROmorphone        ROS: Unable to obtain  Physical Examination: Blood pressure 154/87, pulse 86, temperature 97.2 F (36.2 C), temperature source Oral, resp. rate 26, SpO2 95.00%.  Neurologic Examination:   Mental Status: Alert, not able to follow commands both verbally or visual commands Cranial Nerves: II-no blink to threat. III/IV/VI-Extraocular movements show left gaze preference and decreased ability to look to the right .  Pupils reactive bilaterally. V/VII-face symmetric VIII-does not wince to pain IX/X-normal gag  Motor: Moves left arm and leg antigravity spontaneously but not to command.  He is not moving his right arm or leg.   Sensory: no withdrawal from pain in all 4 extremities Deep Tendon Reflexes: 2+ and symmetric throughout Plantars: Downgoing left and upgoing on the right  Lab Results  Component Value Date/Time   CHOL 116 11/13/2011  9:52 AM   TSH 1.47 11/13/2011  9:52 AM   Dg Chest 2 View  12/08/2011  *RADIOLOGY REPORT*  Clinical Data: Hypertension, carotid  stenosis, diabetes, sleep apnea, former smoker preoperative assessment for carotid endarterectomy  CHEST - 2 VIEW  Comparison: 04/07/2010  Findings: Enlargement of cardiac silhouette. Tortuous aorta. Pulmonary vascularity normal. Chronic peribronchial thickening. No pulmonary infiltrate, pleural effusion, or pneumothorax. Minimal end plate spur formation thoracic spine.  IMPRESSION: Enlargement of cardiac silhouette. Chronic bronchitic changes.  Original Report Authenticated By: Lollie Marrow, M.D.    Assessment:    66 y.o. male S/P left CEA with right UE and LE flaccidity, left gaze preference, mute.  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 HgbA1c, fasting lipid panel 2. MRI, MRA  of the brain without contrast 3. PT consult, OT consult, Speech consult 4. Echocardiogram 5. Prophylactic  therapy-Antiplatelet med: Aspirin - dose 300 PR 6. Risk factor modification 7. Keep MAP b/w 120 to 130 8. IV fluids to keep the pressure up 9. HOB at 30 degrees 10. Avoid anticoagulants except in situations requiring DVT prophylaxis    Felicie Morn PA-C [facility] [phone]  12/09/2011, 3:34 PM

## 2011-12-09 NOTE — Progress Notes (Addendum)
Pt. to CT for scan of head, then to 2300.

## 2011-12-09 NOTE — Progress Notes (Signed)
Antibiotic doses reviewed for renal function; doses ordered appropriate for current renal function.  Pharmacy will sign off.  Thank you.  Michelle Malyssa Maris, PharmD, BCPS  

## 2011-12-09 NOTE — Transfer of Care (Signed)
Immediate Anesthesia Transfer of Care Note  Patient: Darrell [name]  Procedure(s) Performed: Procedure(s) (LRB): WOUND EXPLORATION (Left)  Patient Location: PACU  Anesthesia Type: General  Level of Consciousness: confused and responds to stimulation  Airway & Oxygen Therapy: Patient Spontanous Breathing and Patient connected to nasal cannula oxygen  Post-op Assessment: Report given to PACU RN, Post -op Vital signs reviewed and stable and Moving left extremities only.  Dr Hart Rochester at bedside and aware.  Post vital signs: Reviewed and stable  Complications: No apparent anesthesia complications

## 2011-12-09 NOTE — Anesthesia Preprocedure Evaluation (Addendum)
Anesthesia Evaluation  Patient identified by MRN, date of birth, ID band Patient awake    Reviewed: Allergy & Precautions, H&P , NPO status , Patient's Chart, lab work & pertinent test results  Airway  TM Distance: >3 FB Neck ROM: Full    Dental  (+) Teeth Intact   Pulmonary sleep apnea and Continuous Positive Airway Pressure Ventilation ,          Cardiovascular hypertension, Pt. on medications     Neuro/Psych    GI/Hepatic   Endo/Other  Diabetes mellitus-, Well Controlled, Type 2, Oral Hypoglycemic Agents  Renal/GU      Musculoskeletal   Abdominal   Peds  Hematology   Anesthesia Other Findings   Reproductive/Obstetrics                           Anesthesia Physical Anesthesia Plan  ASA: III  Anesthesia Plan: General   Post-op Pain Management:    Induction: Intravenous  Airway Management Planned: Oral ETT  Additional Equipment: Arterial line  Intra-op Plan:   Post-operative Plan: Extubation in OR  Informed Consent: I have reviewed the patients History and Physical, chart, labs and discussed the procedure including the risks, benefits and alternatives for the proposed anesthesia with the patient or authorized representative who has indicated his/her understanding and acceptance.   Dental advisory given  Plan Discussed with: CRNA  Anesthesia Plan Comments:        Anesthesia Quick Evaluation

## 2011-12-09 NOTE — Anesthesia Preprocedure Evaluation (Signed)
Anesthesia Evaluation  Patient identified by MRN, date of birth, ID band Patient awake    Reviewed: Allergy & Precautions, H&P , NPO status , Patient's Chart, lab work & pertinent test results, reviewed documented beta blocker date and time   Airway Mallampati: II TM Distance: >3 FB Neck ROM: full    Dental   Pulmonary asthma , sleep apnea ,          Cardiovascular hypertension, + Peripheral Vascular Disease     Neuro/Psych TIANegative Psych ROS   GI/Hepatic negative GI ROS, Neg liver ROS,   Endo/Other  Diabetes mellitus-  Renal/GU negative Renal ROS  Genitourinary negative   Musculoskeletal   Abdominal   Peds  Hematology negative hematology ROS (+)   Anesthesia Other Findings See surgeon's H&P   Reproductive/Obstetrics negative OB ROS                           Anesthesia Physical Anesthesia Plan  ASA: IV and Emergent  Anesthesia Plan: General   Post-op Pain Management:    Induction: Intravenous  Airway Management Planned: Oral ETT  Additional Equipment:   Intra-op Plan:   Post-operative Plan: Extubation in OR  Informed Consent: I have reviewed the patients History and Physical, chart, labs and discussed the procedure including the risks, benefits and alternatives for the proposed anesthesia with the patient or authorized representative who has indicated his/her understanding and acceptance.     Plan Discussed with: CRNA and Surgeon  Anesthesia Plan Comments:         Anesthesia Quick Evaluation

## 2011-12-09 NOTE — Progress Notes (Signed)
MRI nonhemorrhagic left cerebral infarcts. Stat AT III and heparin levels are run in house. Repeat CBC ordered also in case HIT. These have not yet been drawn as patient just back to ICU from MRI. Discussed with pharmacist by phone. They will assist with heparin or AT III depending on lab results and if ok otherwise with vascular surgery.  Jama Flavors, MD [phone]

## 2011-12-09 NOTE — Interval H&P Note (Signed)
History and Physical Interval Note:  12/09/2011 8:38 AM  Darrell [name]  has presented today for surgery, with the diagnosis of Severe Left carotid stenosis  The various methods of treatment have been discussed with the patient and family. After consideration of risks, benefits and other options for treatment, the patient has consented to  Procedure(s) (LRB): ENDARTERECTOMY CAROTID (Left) as a surgical intervention .  The patients' history has been reviewed, patient examined, no change in status, stable for surgery.  I have reviewed the patients' chart and labs.  Questions were answered to the patient's satisfaction.     [name]

## 2011-12-09 NOTE — Progress Notes (Deleted)
Dr. Hart Rochester at bedside, still aware about pt. only moving R side.

## 2011-12-09 NOTE — Progress Notes (Signed)
Discussed with Dr Arbie Cookey on call. No evidence of bleeding/ hematoma at surgical site. He agrees with beginning anticoagulation. Will follow up labs still pending and pharmacist to assist. Ila Mcgill, MD

## 2011-12-10 ENCOUNTER — Inpatient Hospital Stay (HOSPITAL_COMMUNITY): Payer: Medicare Other

## 2011-12-10 ENCOUNTER — Encounter (HOSPITAL_COMMUNITY): Payer: Self-pay | Admitting: Vascular Surgery

## 2011-12-10 DIAGNOSIS — I6350 Cerebral infarction due to unspecified occlusion or stenosis of unspecified cerebral artery: Secondary | ICD-10-CM | POA: Diagnosis not present

## 2011-12-10 DIAGNOSIS — I10 Essential (primary) hypertension: Secondary | ICD-10-CM | POA: Diagnosis not present

## 2011-12-10 DIAGNOSIS — E119 Type 2 diabetes mellitus without complications: Secondary | ICD-10-CM | POA: Diagnosis not present

## 2011-12-10 DIAGNOSIS — I6529 Occlusion and stenosis of unspecified carotid artery: Secondary | ICD-10-CM | POA: Diagnosis not present

## 2011-12-10 LAB — LIPID PANEL: Cholesterol: 116 mg/dL (ref 0–200)

## 2011-12-10 LAB — GLUCOSE, CAPILLARY
Glucose-Capillary: 153 mg/dL — ABNORMAL HIGH (ref 70–99)
Glucose-Capillary: 164 mg/dL — ABNORMAL HIGH (ref 70–99)
Glucose-Capillary: 193 mg/dL — ABNORMAL HIGH (ref 70–99)
Glucose-Capillary: 203 mg/dL — ABNORMAL HIGH (ref 70–99)
Glucose-Capillary: 207 mg/dL — ABNORMAL HIGH (ref 70–99)
Glucose-Capillary: 219 mg/dL — ABNORMAL HIGH (ref 70–99)

## 2011-12-10 LAB — LUPUS ANTICOAGULANT PANEL
DRVVT: 40.9 secs (ref 34.1–42.2)
PTT Lupus Anticoagulant: 42.7 secs (ref 28.0–43.0)

## 2011-12-10 LAB — BASIC METABOLIC PANEL
BUN: 13 mg/dL (ref 6–23)
Chloride: 102 mEq/L (ref 96–112)
Creatinine, Ser: 0.78 mg/dL (ref 0.50–1.35)
GFR calc Af Amer: >90 mL/min (ref >90)

## 2011-12-10 LAB — PROTEIN C ACTIVITY: Protein C Activity: 130 % (ref 75–133)

## 2011-12-10 LAB — HEPARIN LEVEL (UNFRACTIONATED): Heparin Unfractionated: <0.1 IU/mL — ABNORMAL LOW (ref 0.30–0.70)

## 2011-12-10 LAB — HEMOGLOBIN A1C
Hgb A1c MFr Bld: 7.5 % — ABNORMAL HIGH (ref <5.7)
Mean Plasma Glucose: 169 mg/dL — ABNORMAL HIGH (ref <117)

## 2011-12-10 LAB — CBC
HCT: 34.7 % — ABNORMAL LOW (ref 39.0–52.0)
MCHC: 33.7 g/dL (ref 30.0–36.0)
MCV: 86.3 fL (ref 78.0–100.0)
RDW: 13.4 % (ref 11.5–15.5)
WBC: 14.7 10*3/uL — ABNORMAL HIGH (ref 4.0–10.5)

## 2011-12-10 LAB — ANTITHROMBIN III: AntiThromb III Func: 77 % (ref 75–120)

## 2011-12-10 LAB — HOMOCYSTEINE: Homocysteine: 14 umol/L (ref 4.0–15.4)

## 2011-12-10 MED ORDER — ASPIRIN 300 MG RE SUPP
300.0000 mg | Freq: Every day | RECTAL | Status: DC
  Administered 2011-12-10 – 2011-12-12 (×3): 300 mg via RECTAL
  Filled 2011-12-10 (×6): qty 1

## 2011-12-10 MED ORDER — ASPIRIN EC 325 MG PO TBEC
325.0000 mg | DELAYED_RELEASE_TABLET | Freq: Every day | ORAL | Status: DC
  Filled 2011-12-10 (×4): qty 1

## 2011-12-10 NOTE — Addendum Note (Signed)
Addendum  created 12/10/11 0701 by Shireen Quan, CRNA   Modules edited:Anesthesia Medication Administration

## 2011-12-10 NOTE — Progress Notes (Signed)
INITIAL ADULT NUTRITION ASSESSMENT Date: 12/10/2011   Time: 2:36 PM Reason for Assessment: Low braden  ASSESSMENT: Male 66 y.o.  Dx:  Patient Active Problem List  Diagnoses  . DIABETES MELLITUS, TYPE II  . HYPERLIPIDEMIA  . SLEEP APNEA, OBSTRUCTIVE, SEVERE  . HYPERTENSION  . VIRAL URI  . ASTHMA, ACUTE  . AUTONOMIC NEUROPATHY, DIABETIC  . Left carotid bruit  . Carotid artery plaque  . Occlusion and stenosis of carotid artery without mention of cerebral infarction   S/p carotid endarterectomy, and thrombectomy.   Hx:  Past Medical History  Diagnosis Date  . Diabetes mellitus   . Sleep apnea     cpap      sleep study > 4 yrs  . Hypertension     dr Tawanna Cooler    Related Meds:     . antiseptic oral rinse  15 mL Mouth Rinse q12n4p  . aspirin EC  325 mg Oral Daily   Or  . aspirin  300 mg Rectal Daily  . cefUROXime (ZINACEF)  IV  1.5 g Intravenous Q12H  . chlorhexidine  15 mL Mouth Rinse BID  . HYDROmorphone      . insulin aspart  0-15 Units Subcutaneous Q4H  . DISCONTD: amitriptyline  10 mg Oral QHS  . DISCONTD: aspirin EC  325 mg Oral Daily  . DISCONTD: aspirin  300 mg Rectal Daily  . DISCONTD: docusate sodium  100 mg Oral Daily  . DISCONTD: glipiZIDE  10 mg Oral BID  . DISCONTD: insulin aspart  0-15 Units Subcutaneous TID WC  . DISCONTD: lisinopril  20 mg Oral Daily  . DISCONTD: metFORMIN  1,000 mg Oral BID WC     Ht: 5' 10.87" (180 cm)  Wt: 244 lb 7.8 oz (110.9 kg)  Ideal Wt: 77.8 kg  % Ideal Wt: 143%  Usual Wt: unknown  % Usual Wt: --  Body mass index is 34.23 kg/(m^2). Obesity unspecified  Food/Nutrition Related Hx: No problems identified per nutrition risk screen, nutrition consult was indicated as yes, but no comment given for reason.   Labs:  CMP     Component Value Date/Time   NA 134* 12/10/2011 0425   K 3.9 12/10/2011 0425   CL 102 12/10/2011 0425   CO2 21 12/10/2011 0425   GLUCOSE 208* 12/10/2011 0425   GLUCOSE 141* 10/05/2006 0904   BUN 13  12/10/2011 0425   CREATININE 0.78 12/10/2011 0425   CALCIUM 8.6 12/10/2011 0425   PROT 7.2 12/08/2011 1209   ALBUMIN 4.0 12/08/2011 1209   AST 29 12/08/2011 1209   ALT 29 12/08/2011 1209   ALKPHOS 39 12/08/2011 1209   BILITOT 0.4 12/08/2011 1209   GFRNONAA >90 12/10/2011 0425   GFRAA >90 12/10/2011 0425   CBG (last 3)   Basename 12/10/11 1215 12/10/11 0829 12/10/11 0532  GLUCAP 171* 177* 203*     Intake/Output Summary (Last 24 hours) at 12/10/11 1446 Last data filed at 12/10/11 1100  Gross per 24 hour  Intake 1885.27 ml  Output   1980 ml  Net -94.73 ml     Diet Order: Carb mod medium  Supplements/Tube Feeding: none  IVF:    sodium chloride Last Rate: 100 mL/hr (12/09/11 2038)  DOPamine   DISCONTD: DOPamine   DISCONTD: heparin Last Rate: 1,800 Units/hr (12/10/11 0800)    Estimated Nutritional Needs:   Kcal:2000-2200 Protein: 105-115 gm Fluid: 2.1 - 2.3 L  Patient was seen by SLP 2/14, recommended a MBS be completed, NPO. Patient has global  aphasia and  deficits in verbal expression, and difficulty in moving right side. Patient is at risk for skin breakdown due to poor po intake, none documented, and difficulty with movement related to recent CVA.   NUTRITION DIAGNOSIS: -Inadequate oral intake (NI-2.1).  Status: Ongoing  RELATED TO: NPO recommendation by SLP  AS EVIDENCE BY:  0 meals documented this admission (2 days)   MONITORING/EVALUATION(Goals): Goal: Patient will be able to safely tolerate PO intake or alternate means of nutrition will be initiated  Monitor: MBS results, PO intake, weight, labs  EDUCATION NEEDS: -No education needs identified at this time  INTERVENTION: 1. RD will monitor MBS results and add supplements as appropriate.   Dietitian [phone]  DOCUMENTATION CODES Per approved criteria  -Not Applicable    Clarene Duke MARIE 12/10/2011, 2:36 PM

## 2011-12-10 NOTE — Addendum Note (Signed)
Addendum  created 12/10/11 0701 by Tamaria Dunleavy R Tamy Accardo, CRNA   Modules edited:Anesthesia Medication Administration    

## 2011-12-10 NOTE — Addendum Note (Signed)
Addendum  created 12/10/11 0703 by Essex Perry R Perez Dirico, CRNA   Modules edited:Anesthesia Medication Administration    

## 2011-12-10 NOTE — Progress Notes (Signed)
Utilization review completed. Sarafina Puthoff, RN, BSN. 12/10/11  

## 2011-12-10 NOTE — Progress Notes (Signed)
ANTICOAGULATION CONSULT NOTE - Initial Consult  Pharmacy Consult for Heparin Indication: s/p thrombectomy and repair of CEA site   Allergies  Allergen Reactions  . Oxycodone-Acetaminophen     REACTION: feels sick    Patient Measurements: Height: 5' 10.87" (180 cm) Weight: 244 lb 7.8 oz (110.9 kg) IBW/kg (Calculated) : 74.99  Heparin Dosing Weight: 99kg  Vital Signs: Temp: 97.2 F (36.2 C) (02/14 0400) Temp src: Oral (02/14 0400) BP: 121/66 mmHg (02/14 0400) Pulse Rate: 70  (02/14 0400)  Labs:  Basename 12/10/11 0425 12/09/11 2201 12/09/11 1927 12/09/11 1248 12/08/11 1209  HGB 11.7* 12.4* -- -- --  HCT 34.7* 36.5* 36.7* -- --  PLT 208 232 200 -- --  APTT -- -- 29 61* 40*  LABPROT -- -- 13.9 15.0 14.1  INR -- -- 1.05 1.16 1.07  HEPARINUNFRC <0.10* -- <0.10* -- --  CREATININE -- -- -- -- 1.07  CKTOTAL -- -- -- -- --  CKMB -- -- -- -- --  TROPONINI -- -- -- -- --   Estimated Creatinine Clearance: 85.9 ml/min (by C-G formula based on Cr of 1.07).  Assessment: 66 yo male s/p CEA , subsequent surgical site thrombosis now s/p thrombectomy and repair.  Heparin infusing appropriately per RN  Goal of Therapy:  Heparin level 0.3-0.5   Plan:  Increase Heparin 1800 units/hr Check heparin level in 8 hours.  Eddie Candle 12/10/2011,4:50 AM

## 2011-12-10 NOTE — Progress Notes (Addendum)
VASCULAR AND VEIN SURGERY POST - OP CEA PROGRESS NOTE  Date of Surgery: 12/09/2011 Surgeon: Moishe Spice): Darrell Gip, MD 1 Day Post-Op left Carotid Endarterectomy .  HPI: Darrell [name] is a 66 y.o. male who is 1 Day Post-Op left Carotid Endarterectomy . Patient is awake, moving left side but not to command. Extended fingers on left when asked to make a fist. Has expressive aphasia and left facial droop. Seems to be handling secretions without difficulty. No purposeful movement on right.  IMAGING: MRI Head 12/09/11 IMPRESSION:  1. Non hemorrhagic left cerebral infarcts within a watershed  distribution both anteriorly and posteriorly.  2. Additional focal non hemorrhagic infarct within the anterior  left basal ganglia.  3. Remote lacunar infarcts of the lentiform nucleus bilaterally.  The number for mild periventricular and subcortical white matter  changes are advanced for age. This likely reflecting sequelae of  chronic microvascular ischemia.   Significant Diagnostic Studies: CBC Lab Results  Component Value Date   WBC 14.7* 12/10/2011   HGB 11.7* 12/10/2011   HCT 34.7* 12/10/2011   MCV 86.3 12/10/2011   PLT 208 12/10/2011    BMET    Component Value Date/Time   NA 134* 12/10/2011 0425   K 3.9 12/10/2011 0425   CL 102 12/10/2011 0425   CO2 21 12/10/2011 0425   GLUCOSE 208* 12/10/2011 0425   GLUCOSE 141* 10/05/2006 0904   BUN 13 12/10/2011 0425   CREATININE 0.78 12/10/2011 0425   CALCIUM 8.6 12/10/2011 0425   GFRNONAA >90 12/10/2011 0425   GFRAA >90 12/10/2011 0425    COAG Lab Results  Component Value Date   INR 1.05 12/09/2011   INR 1.16 12/09/2011   INR 1.07 12/08/2011   No results found for this basename: PTT      Intake/Output Summary (Last 24 hours) at 12/10/11 0750 Last data filed at 12/10/11 0700  Gross per 24 hour  Intake 3267.27 ml  Output   2315 ml  Net 952.27 ml  JP with 30 cc output overnight  Physical Exam:  BP Readings from Last 3 Encounters:    12/10/11 117/77  12/10/11 117/77  12/10/11 117/77   Temp Readings from Last 3 Encounters:  12/10/11 97.2 F (36.2 C) Oral  12/10/11 97.2 F (36.2 C) Oral  12/10/11 97.2 F (36.2 C) Oral   SpO2 Readings from Last 3 Encounters:  12/10/11 97%  12/10/11 97%  12/10/11 97%   Pulse Readings from Last 3 Encounters:  12/10/11 73  12/10/11 73  12/10/11 73    Pt is Awake and alert Speech is expressive aphasia left Neck Wound is soft - JP with serosanguenous drainage Patient with Positive facial droop Pt has no purposeful movement on right  Assessment: Darrell [name] is a 66 y.o. male is S/P Left Carotid endarterectomy and take back for neuro changes, clot in ICA and CCA - GSV  Interposition graft Multiple cerebral infarcts  Plan: Continue to monitor Poss DC drain today Swallow evaluation today Cont NPO hypercoag panel ordered Appreciate Neurology Help PT/OT   [name] [phone] 12/10/2011 7:50 AM  Patient is more alert today. He has been hemodynamically stable. He is handling his secretions well. He moves his left side but not to command. Seems to have a receptive and expressive aphasia. Facial droop. 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   On heparin therapy currently at 1800 units per hour. Previous heparin level was sub-therapeutic-less than 0.1. Platelet levels are normal.Appreciate Dr. Precious Reel help with hypercoagulable workup. Patient seems extremely resistant to heparin.  Minimal drainage from left JP drain. No evidence of hematoma in the left neck on physical exam.  Agree with plans to begin PT/OT, swallowing E. Valve, out of bed today, continue heparin therapy.  Will update current situation with wife Erskine Squibb this morning and keep in SICU today

## 2011-12-10 NOTE — Procedures (Signed)
Modified Barium Swallow Procedure Note Patient Details  Name: Darrell [name] MRN: [id number] Date of Birth: [birth year]  Today's Date: 12/10/2011 Time:  -     Past Medical History:  Past Medical History  Diagnosis Date  . Diabetes mellitus   . Sleep apnea     cpap      sleep study > 4 yrs  . Hypertension     dr Tawanna Cooler   Past Surgical History:  Past Surgical History  Procedure Date  . Hernia repair   . Heel spur surgery   . Heer surgery   . Endarterectomy 12/09/2011    Procedure: ENDARTERECTOMY CAROTID;  Surgeon: [name], MD;  [facility]: [facility] OR;  Service: Vascular;  Laterality: Left;  Left Carotid endarterectomy with dacron patch angioplasty   HPI:  Darrell [name] is a 66 y.o. male who is 2 days Post-Op left Carotid Endarterectomy. PT had CVA 12/09/11 with infarcts in the left anterior basal ganglia and left cerebral infarcts within a watershed distribution both anteriorly and posteriorly. Pt has left-sided facial droop and no purposeful movement on the right. Reported to have expressive aphasia and to be handling secretions without difficulty. Pt has a history of refulx. `Swallowing evaluation recommended due to type of surgery as well as recent CVA. Due to results of bedside assessment as well as post-op swelling and acute CVA, objective test was recommend for further assessment.     Recommendation/Prognosis  Clinical Impression Dysphagia Diagnosis: Moderate oral phase dysphagia;Moderate pharyngeal phase dysphagia Clinical impression: Pt presents with a moderate oropharyngeal dysphagia characterized by decreased awareness of bolus, oral holding, decreased bolus cohesion, a delay in swallow initiation, and generalized laryngeal/pharyngeal weakness, resulting in aspiration/penetration during the swallow and penetration of residuals after the swallow. Although no aspiration/penetration observed with pureed solids, pt with poor awareness of residuals and global aphasia prohibiting  use of compensatory strategies at this time, further increasing aspiration risk. Recommend NPO due to high aspiration risk based on current cognitive status, including decreased awareness as well as impaired auditory comprehension and initiation. Recommend f/u with repeat MBS when pt shows improvement in cognitive-linguistic skills.  Swallow Evaluation Recommendations Recommended Consults: MBS (f/u MBS with improvement of cognitive-linguistic skills) General Recommendation: NPO Medication Administration: Via alternative means Oral Care Recommendations: Oral care QID Recommendations for Other Services: Rehab consult Follow up Recommendations: Inpatient Rehab Prognosis Prognosis for Safe Diet Advancement: Good Barriers to Reach Goals: Cognitive deficits;Language deficits;Severity of dysphagia Individuals Consulted Consulted and Agree with Results and Recommendations: Patient;RN  SLP Goals  SLP Swallowing Goals Goal #3: Pt will functionally, orally accept a bolus and initiate oral transit with moderate verbal/visual/tactile cues from SLP. Swallow Study Goal #3 - Progress: Not met  General:  Date of Onset: 12/09/11 Type of Study: Initial MBS Diet Prior to this Study: NPO;IV Temperature Spikes Noted: No Respiratory Status: Supplemental O2 delivered via (comment) (nasal cannula, 2L) History of Intubation: No Behavior/Cognition: Cooperative;Lethargic;Requires cueing Oral Cavity - Dentition: Adequate natural dentition Oral Motor / Sensory Function: Impaired - see Bedside swallow eval Vision: Functional for self-feeding (limited vision in right eye; wears glasses) Patient Positioning: Upright in chair Baseline Vocal Quality: Clear Volitional Cough: Other (Comment) (unable to elicit secondary to global aphasia) Anatomy: Within functional limits  Oral Phase Oral Preparation/Oral Phase Oral Phase: Impaired Oral - Honey Oral - Honey Teaspoon: Reduced posterior propulsion;Holding of  bolus;Lingual/palatal residue;Delayed oral transit (didn't close mouth around bolus; decreased bolus cohesion) Oral - Nectar Oral - Nectar Teaspoon: Reduced posterior propulsion;Holding of bolus;Lingual/palatal  residue;Delayed oral transit (didn't close mouth around bolus; decreased bolus cohesion) Oral - Solids Oral - Puree: Reduced posterior propulsion;Holding of bolus;Lingual/palatal residue;Delayed oral transit (didn't close mouth around bolus; decreased bolus cohesion) Pharyngeal Phase  Pharyngeal Phase Pharyngeal Phase: Impaired Pharyngeal - Honey Pharyngeal - Honey Teaspoon: Delayed swallow initiation;Premature spillage to pyriform;Reduced epiglottic inversion;Reduced anterior laryngeal mobility;Reduced laryngeal elevation;Reduced airway/laryngeal closure;Reduced tongue base retraction;Pharyngeal residue - valleculae;Pharyngeal residue - posterior pharnyx;Pharyngeal residue - pyriform;Penetration/Aspiration after swallow Penetration/Aspiration details (honey teaspoon): Material enters airway, remains ABOVE vocal cords and not ejected out;Material enters airway, CONTACTS cords and not ejected out (pt cued to cough, unable to elicit due to global aphasia) Pharyngeal - Nectar Pharyngeal - Nectar Teaspoon: Delayed swallow initiation;Premature spillage to pyriform;Penetration/Aspiration during swallow;Moderate aspiration;Pharyngeal residue - valleculae;Reduced epiglottic inversion;Reduced anterior laryngeal mobility;Reduced laryngeal elevation;Reduced airway/laryngeal closure;Reduced tongue base retraction;Pharyngeal residue - pyriform;Pharyngeal residue - posterior pharnyx Penetration/Aspiration details (nectar teaspoon): Material enters airway, passes BELOW cords without attempt by patient to eject out (silent aspiration) (pt cued to cough, unable to elicit due to global aphasia) Pharyngeal - Solids Pharyngeal - Puree: Delayed swallow initiation;Premature spillage to valleculae;Reduced epiglottic  inversion;Reduced anterior laryngeal mobility;Reduced laryngeal elevation;Reduced airway/laryngeal closure;Reduced tongue base retraction;Pharyngeal residue - valleculae;Pharyngeal residue - pyriform;Pharyngeal residue - posterior pharnyx Cervical Esophageal Phase  Cervical Esophageal Phase Cervical Esophageal Phase: Impaired Cervical Esophageal Phase - Honey Honey Teaspoon: Reduced cricopharyngeal relaxation Cervical Esophageal Phase - Nectar Nectar Teaspoon: Reduced cricopharyngeal relaxation Cervical Esophageal Phase - Solids Puree: Reduced cricopharyngeal relaxation     Maxcine Ham 12/10/2011, 3:41 PM  Maxcine Ham, SLP Student

## 2011-12-10 NOTE — Progress Notes (Signed)
Nursing Brief Daily Progress:  Throughout the day today [name] has become more alert and mobile.  Today, with the help of physical therapy, we were able to stand multiple times at the bedside with assistance.  Mr. [name] has progressively gained some mobility in his right leg but is still unable to purposefully move his right arm at this time.  The bedside swallow and cognition evaluation was performed this morning with follow up modified barium swallow in radiology.  His swallow evaluation had a poor outcome today and the speech pathologist recommended a follow up examination in the morning when [name] is better rested for the test.  Mr. [name] vital signs have remained stable throughout the day.

## 2011-12-10 NOTE — Addendum Note (Signed)
Addendum  created 12/10/11 0703 by Shireen Quan, CRNA   Modules edited:Anesthesia Medication Administration

## 2011-12-10 NOTE — Evaluation (Addendum)
Physical Therapy Evaluation Patient Details Name: SAKETH DAUBERT MRN: [id number] DOB: 01/15/1946 Today's Date: 12/10/2011  Problem List:  Patient Active Problem List  Diagnoses  . DIABETES MELLITUS, TYPE II  . HYPERLIPIDEMIA  . SLEEP APNEA, OBSTRUCTIVE, SEVERE  . HYPERTENSION  . VIRAL URI  . ASTHMA, ACUTE  . AUTONOMIC NEUROPATHY, DIABETIC  . Left carotid bruit  . Carotid artery plaque  . Occlusion and stenosis of carotid artery without mention of cerebral infarction    Past Medical History:  Past Medical History  Diagnosis Date  . Diabetes mellitus   . Sleep apnea     cpap      sleep study > 4 yrs  . Hypertension     dr Tawanna Cooler   Past Surgical History:  Past Surgical History  Procedure Date  . Hernia repair   . Heel spur surgery   . Heer surgery   . Endarterectomy 12/09/2011    Procedure: ENDARTERECTOMY CAROTID;  Surgeon: [name], MD;  Location: [facility] OR;  Service: Vascular;  Laterality: Left;  Left Carotid endarterectomy with dacron patch angioplasty    PT Assessment/Plan/Recommendation PT Assessment HPI: Mr. RICHIE BONANNO is a 66 y.o. male who is 2 days Post-Op left Carotid Endarterectomy. 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 Pt has left-sided facial droop and no purposeful movement on the right per chart. Reported to have expressive aphasia and to be handling secretions without difficulty.  Clinical Impression Statement: Presents to PT today for eval 12/10/11. Will benefit physical therapy in the acute setting to address the following problem list as well as to maximize mobility and indepence so as to accelerate recovery and decrease burden of care at next venue. I would recommend a CIR consult for follow up therapies.   PT Recommendation/Assessment: Patient will need skilled PT in the acute care venue PT Problem List: Decreased  strength;Decreased range of motion;Decreased activity tolerance;Decreased balance;Decreased mobility;Decreased coordination;Decreased cognition;Decreased knowledge of use of DME;Decreased safety awareness;Impaired tone Barriers to Discharge: Inaccessible home environment PT Therapy Diagnosis : Difficulty walking;Abnormality of gait;Generalized weakness;Altered mental status PT Plan PT Frequency: Min 4X/week PT Treatment/Interventions: DME instruction;Gait training;Functional mobility training;Neuromuscular re-education;Balance training;Therapeutic exercise;Therapeutic activities;Cognitive remediation;Patient/family education PT Recommendation Recommendations for Other Services :Rehab consult! Follow Up Recommendations: Inpatient Rehab Equipment Recommended: Defer to next venue PT Goals  Acute Rehab PT Goals PT Goal Formulation: With patient Time For Goal Achievement: 2 weeks Pt will Roll Supine to Right Side: with min assist PT Goal: Rolling Supine to Right Side - Progress: Goal set today Pt will Roll Supine to Left Side: with min assist PT Goal: Rolling Supine to Left Side - Progress: Goal set today Pt will go Supine/Side to Sit: with min assist PT Goal: Supine/Side to Sit - Progress: Goal set today Pt will Sit at Edge of Bed: with no upper extremity support;3-5 min;with supervision (while performing balance activities) PT Goal: Sit at Edge Of Bed - Progress: Goal set today Pt will go Sit to Supine/Side: with min assist PT Goal: Sit to Supine/Side - Progress: Goal set today Pt will go Sit to Stand: with mod assist PT Goal: Sit to Stand - Progress: Goal set today Pt will go Stand to Sit: with mod assist PT Goal: Stand to Sit - Progress: Goal set today Pt will Transfer Bed to Chair/Chair to Bed: with mod assist PT Transfer Goal: Bed to Chair/Chair to Bed - Progress: Goal  set today Pt will Stand: with mod assist;3 - 5 min;with no upper extremity support PT Goal: Stand - Progress: Goal  set today Pt will Ambulate: 1 - 15 feet;with least restrictive assistive device;with mod assist PT Goal: Ambulate - Progress: Goal set today Pt will Perform Home Exercise Program: Independently PT Goal: Perform Home Exercise Program - Progress: Goal set today  PT Evaluation Precautions/Restrictions  Precautions Precautions: Fall Prior Functioning  Home Living Lives With: Spouse Receives Help From: Family Type of Home: House Home Layout: Two level;Bed/bath upstairs Home Access: Stairs to enter Entrance Stairs-Number of Steps: 5 Home Adaptive Equipment: Straight cane Prior Function Level of Independence: Independent with basic ADLs;Independent with homemaking with ambulation;Independent with gait;Independent with transfers Driving: Yes Vocation: Full time employment Cognition Cognition Arousal/Alertness: Awake/alert Overall Cognitive Status: Impaired Attention: Impaired Current Attention Level: Sustained Following Commands: Follows one step commands inconsistently (follows very simple direct one word commands) Awareness of Errors: Decreased awareness of errors made Decreased Awareness of Errors: Assistance required to correct errors made Awareness of Errors - Other Comments: pt answering questions with nod (often times an inappropriate nod) Awareness of Deficits: Decreased awareness of deficits Problem Solving: Requires assistance for problem solving Cognition - Other Comments: demonstrates neglect of the right, can turn head to right with cueing Sensation/Coordination Sensation Additional Comments: unable to fully test because of cognition Coordination Gross Motor Movements are Fluid and Coordinated: No Fine Motor Movements are Fluid and Coordinated: No Coordination and Movement Description: noted resting tremor in LLE Extremity Assessment RUE Strength RUE Overall Strength Comments: pt unable to follow command for grip strength test, very rigid/increased synergistic tone in  shoulder and elbow extensors (difficulty passively flexing elbow); during range of shoulder pt with increased stiffness in shoulder extension limiting flexion to 80-90 degress and external rotation  LUE Assessment LUE Assessment:  (grossly 3/5) RLE Strength RLE Overall Strength Comments: pt spontaneously moving RLE but when AAROM performed pt very stiff in hips/knees limiting passive/active assisted range  LLE Strength LLE Overall Strength Comments: similar pattern as LLE but pt more able to activate LLE for AROM knee flex/extension; again very rigid with AAROM  Mobility (including Balance) Bed Mobility Bed Mobility: Yes Rolling Right: 1: +2 Total assist (20%) Rolling Right Details (indicate cue type and reason): maxA to flex knees and faciliation to rotate trunk (trunk very rigid) Supine to Sit: 1: +2 Total assist (20%) Supine to Sit Details (indicate cue type and reason): pt attempted to initiate moving legs off bed but very stiff hips; facilation to bring knees/hips into flexion and bring off bed as well as elevate trunk Sit to Supine: 1: +2 Total assist (+3totalpt10%) Transfers Transfers: Yes Sit to Stand: 1: +2 Total assist (40%) Sit to Stand Details (indicate cue type and reason): Sit->stand x3 with pt initiating stand with trunk/hip flex but unable to follow through without mod-max faciliation at hips for extenion and anterior translation Stand to Sit: 1: +2 Total assist (45%) Stand to Sit Details: reaches back with LUE with cueing; faciliation to control descent Ambulation/Gait Ambulation/Gait: No  Balance Balance Assessed: Yes Static Sitting Balance Static Sitting - Balance Support: No upper extremity supported Static Sitting - Level of Assistance: 5: Stand by assistance;4: Min assist Static Sitting - Comment/# of Minutes: initially pt with posterior lean needing mod-maxA but with increased upright sitting (3-4 minutes) pt sat with mingaurdA  Static Standing Balance Static  Standing - Balance Support: No upper extremity supported Static Standing - Level of Assistance: 1: +2 Total  assist (35%) Static Standing - Comment/# of Minutes: knee block on right to prevent buckling, some tremulousness on left in stance (tremor? ataxia?) Exercise    End of Session PT - End of Session Equipment Utilized During Treatment: Gait belt Activity Tolerance: Patient tolerated treatment well Patient left: in bed;with call bell in reach;with family/visitor present Nurse Communication: Mobility status for transfers General Behavior During Session: [facility] for tasks performed Cognition: Impaired  WHITLOW,Mariyana Fulop HELEN 12/10/2011, 2:39 PM

## 2011-12-10 NOTE — Progress Notes (Signed)
Heme/Onc  Post op day 1 left carotid endarterectomy  Situation followed overnight on call and reviewed now.   ATIII level last pm only slightly decreased at 71, which may have reflected heparin used intraoperatively. Platelet count remained good, no evidence of HIT. Heparin level several hours out from the intraoperative dosing was < 0.1. Repeat PTT was 29. Rest of lab evaluation in process.  After discussion with vascular surgery following MRI results, heparin was given overnight by pharmacy. This was initiated at 1000 units/ hr at  2038, increased to 1400 units/hr, heparin level at 0425 still <0.1 and rate increased to 1800 units/ hr then. He has had no significant bleeding noted including at surgical site.  This am Dr. Pearlean Brownie recommends holding anticoagulation due to risk of hemorrhage in acute stroke, at least until lab evaluation is available. Heparin DCd at [phone].  CBC this AM: WBC 14.7, Hgb 11.7, plt 208k  I have spoken with daughter outside of room now, nursing interventions in process. Family feels he is more alert than last pm, recognizes them, became tearful when told of stroke. Family understands that we will follow up labs pending and understands plan of care now.  Assessment/ recommendations: 1.Acute thrombosis of endarterectomy with associated watershed infarcts, post resection of the graft and replacement with venous graft: hypercoagulable work up in process. No anticoagulation except ASA at present at recommendation of neurology. 2.Diabetes 3.HTN 4.past tobacco Please call if our group can be of help prior to next rounding.  Darrell [name] P.Darrold Span, MD [phone]

## 2011-12-10 NOTE — Progress Notes (Signed)
Speech Language/Pathology Clinical/Bedside Swallow Evaluation Patient Details  Name: Darrell [name] MRN: [id number] DOB: Jul 16, 1946 Today's Date: 12/10/2011  Past Medical History:  Past Medical History  Diagnosis Date  . Diabetes mellitus   . Sleep apnea     cpap      sleep study > 4 yrs  . Hypertension     dr Tawanna Cooler   Past Surgical History:  Past Surgical History  Procedure Date  . Hernia repair   . Heel spur surgery   . Heer surgery    HPI:  Darrell [name] is a 66 y.o. male who is 2 days Post-Op left Carotid Endarterectomy. PT had CVA 12/09/11 with infarcts in the left anterior basal ganglia and left cerebral infarcts within a watershed distribution both anteriorly and posteriorly. Pt has left-sided facial droop and no purposeful movement on the right. Reported to have expressive aphasia and to be handling secretions without difficulty. Pt has a history of refulx. `Swallowing evaluation recommended due to type of surgery as well as recent CVA.    Assessment/Recommendations/Treatment Plan Suspected Esophageal Findings Suspected Esophageal Findings: Belching  SLP Assessment Clinical Impression Statement: No overt s/s of aspiration at bedside observed with thin liquids, however right anterior spillage and multiple swallows as well as a change in facial expression are suggestive of difficulty protecting airway. Given above, as well as increased risk of aspiration due to post-op swelling and acute CVA, recommend MBSS to rule out silent aspiration. Risk for Aspiration: Moderate Other Related Risk Factors: History of GERD;Previous CVA;Lethargy;Cognitive impairment  Swallow Evaluation Recommendations Recommended Consults: MBS General Recommendation: NPO Medication Administration: Via alternative means Oral Care Recommendations: Oral care QID Recommendations for Other Services: Rehab consult Follow up Recommendations: Inpatient Rehab  Prognosis Barriers to Reach Goals: Cognitive  deficits;Language deficits  Individuals Consulted Consulted and Agree with Results and Recommendations: Patient;Family member/caregiver Family Member Consulted: wife, 2 daughters  Swallow Study Prior Functional Status  Cognitive/Linguistic Baseline: Within functional limits Type of Home: House Lives With: Spouse Receives Help From: Family Vocation: Full time employment  General  Date of Onset: 12/09/11 HPI: Darrell [name] is a 66 y.o. male who is 2 days Post-Op left Carotid Endarterectomy. PT had CVA 12/09/11 with infarcts in the left anterior basal ganglia and left cerebral infarcts within a watershed distribution both anteriorly and posteriorly. Pt has left-sided facial droop and no purposeful movement on the right. Reported to have expressive aphasia and to be handling secretions without difficulty. Pt has a history of refulx. `Swallowing evaluation recommended due to type of surgery as well as recent CVA.  Type of Study: Bedside swallow evaluation Diet Prior to this Study: NPO;IV Temperature Spikes Noted: No Respiratory Status: Supplemental O2 delivered via (comment) (nasal cannula; 3 L) History of Intubation: No Behavior/Cognition: Cooperative;Lethargic;Requires cueing Oral Cavity - Dentition: Adequate natural dentition Vision: Functional for self-feeding (limited vision in right eye; wears glasses) Patient Positioning: Upright in bed Baseline Vocal Quality: Clear Volitional Cough: Other (Comment) (secondary to aphasia)  Oral Motor/Sensory Function  Overall Oral Motor/Sensory Function: Impaired (difficult to assess due to global aphasia) Labial ROM: Reduced right Labial Symmetry: Abnormal symmetry right Facial ROM: Reduced right Facial Symmetry: Right droop  Consistency Results  Ice Chips Ice chips: Within functional limits Presentation: Spoon  Thin Liquid Thin Liquid: Impaired Presentation: Cup;Self Fed (with SLP assist) Oral Phase Impairments: Reduced labial  seal Oral Phase Functional Implications: Right anterior spillage Pharyngeal  Phase Impairments: Multiple swallows Other Comments: Change in facial expression after swallow suggestive of  difficulty protecting airway.  Nectar Thick Liquid Nectar Thick Liquid: Not tested  Honey Thick Liquid Honey Thick Liquid: Not tested  Puree Puree: Not tested  Solid Solid: Not tested   Darrell [name] 12/10/2011,12:07 PM   Darrell [name], SLP Student

## 2011-12-10 NOTE — Progress Notes (Signed)
Stroke Team Progress Note  HISTORY Darrell [name] is an 67 y.o. male patient presented to [facility] for L CEA. Post operatively patient was noted to become unresponsive and not moving right side. Patient was brought back to the ED to further evaluation there was thrombus throughout the entire endarterectomy site. This was removed and patient was brought back to PACU. Neurology was ask to evaluate due to not moving right side, not able to follow commands and question of stroke. He was last seen normal at 1212. Not a tPA candidate secondary to recent carotid surgery.  SUBJECTIVE His wife and 2 daughters are at the bedside. Overall he feels his condition is stable. And he is more alert compared to yesterday but remains aphasic with dense right hemiplegia. I discussed his plan of care with dr Hart [place].  OBJECTIVE Filed Vitals:   12/10/11 0500 12/10/11 0600 12/10/11 0700 12/10/11 0831  BP: 136/82 137/80 117/77   Pulse: 78 82 73   Temp:    98.1 F (36.7 C)  TempSrc:    Oral  Resp: 22 22 21    Height:      Weight:      SpO2: 95% 95% 97%     CBG (last 3)  Basename 12/10/11 0532 12/10/11 0446 12/10/11 0313  GLUCAP 203* 193* 219*   Intake/Output from previous day: 02/13 0701 - 02/14 0700 In: 3267.3 [I.V.:3167.3; IV Piggyback:100] Out: 2315 [Urine:2160; Drains:30; Blood:125]  IV Fluid Intake:     . sodium chloride 100 mL/hr (12/09/11 2038)  . DOPamine    . heparin 1,800 Units/hr (12/10/11 7829)  . DISCONTD: DOPamine     Medications    . antiseptic oral rinse  15 mL Mouth Rinse q12n4p  . aspirin EC  325 mg Oral Daily  . cefUROXime (ZINACEF)  IV  1.5 g Intravenous Once  . cefUROXime (ZINACEF)  IV  1.5 g Intravenous Q12H  . chlorhexidine  15 mL Mouth Rinse BID  . HYDROmorphone      . insulin aspart  0-15 Units Subcutaneous Q4H  . DISCONTD: amitriptyline  10 mg Oral QHS  . DISCONTD: aspirin  300 mg Rectal Daily  . DISCONTD: docusate sodium  100 mg Oral Daily  . DISCONTD: glipiZIDE   10 mg Oral BID  . DISCONTD: insulin aspart  0-15 Units Subcutaneous TID WC  . DISCONTD: lisinopril  20 mg Oral Daily  . DISCONTD: metFORMIN  1,000 mg Oral BID WC  PRN sodium chloride, hydrALAZINE, HYDROmorphone (DILAUDID) injection, labetalol, metoprolol, ondansetron (ZOFRAN) IV, ondansetron, phenol, potassium chloride, DISCONTD: guaiFENesin-dextromethorphan, DISCONTD: heparin 6000 unit irrigation, DISCONTD: heparin 6000 unit irrigation, DISCONTD: HYDROmorphone, DISCONTD: meperidine, DISCONTD: morphine, DISCONTD: sodium chloride irrigation, DISCONTD: sodium chloride irrigation, DISCONTD: traMADol  Diet:  NPO Activity:  Out of bed DVT Prophylaxis:  SCDs   Significant Diagnostic Studies: CBC    Component Value Date/Time   WBC 14.7* 12/10/2011 0425   RBC 4.02* 12/10/2011 0425   HGB 11.7* 12/10/2011 0425   HCT 34.7* 12/10/2011 0425   PLT 208 12/10/2011 0425   MCV 86.3 12/10/2011 0425   MCH 29.1 12/10/2011 0425   MCHC 33.7 12/10/2011 0425   RDW 13.4 12/10/2011 0425   LYMPHSABS 0.5* 12/09/2011 1927   MONOABS 0.4 12/09/2011 1927   EOSABS 0.0 12/09/2011 1927   BASOSABS 0.0 12/09/2011 1927   CMP    Component Value Date/Time   NA 134* 12/10/2011 0425   K 3.9 12/10/2011 0425   CL 102 12/10/2011 0425   CO2 21 12/10/2011 0425  GLUCOSE 208* 12/10/2011 0425   GLUCOSE 141* 10/05/2006 0904   BUN 13 12/10/2011 0425   CREATININE 0.78 12/10/2011 0425   CALCIUM 8.6 12/10/2011 0425   PROT 7.2 12/08/2011 1209   ALBUMIN 4.0 12/08/2011 1209   AST 29 12/08/2011 1209   ALT 29 12/08/2011 1209   ALKPHOS 39 12/08/2011 1209   BILITOT 0.4 12/08/2011 1209   GFRNONAA >90 12/10/2011 0425   GFRAA >90 12/10/2011 0425   COAGS Lab Results  Component Value Date   INR 1.05 12/09/2011   INR 1.16 12/09/2011   INR 1.07 12/08/2011   Lipid Panel    Component Value Date/Time   CHOL 116 12/10/2011 0425   TRIG 228* 12/10/2011 0425   HDL 19* 12/10/2011 0425   CHOLHDL 6.1 12/10/2011 0425   VLDL 46* 12/10/2011 0425   LDLCALC 51 12/10/2011  0425   gbA1C  Lab Results  Component Value Date   HGBA1C 7.5* 12/09/2011   Urine Drug Screen  No results found for this basename: labopia, cocainscrnur, labbenz, amphetmu, thcu, labbarb    Alcohol Level No results found for this basename: eth   CT of the brain   No acute or reversible findings.  Age related atrophy.  Mild chronic small vessel changes as outlined above.  Atherosclerosis.   MRI of the brain    1.  Non hemorrhagic left cerebral infarcts within a watershed distribution both anteriorly and posteriorly. 2.  Additional focal non hemorrhagic infarct within the anterior left basal ganglia. 3.  Remote lacunar infarcts of the lentiform nucleus bilaterally. The number for mild periventricular and subcortical white matter changes are advanced for age.  This likely reflecting sequelae of chronic microvascular ischemia.   MRA of the brain  1.  Minimal distal small vessel disease. 2.  No significant proximal stenosis, aneurysm, or branch vessel occlusion.   2D Echocardiogram  ordered   Carotid Doppler  Left 40-59%, right 0-39%  CXR  Enlargement of cardiac silhouette. Chronic bronchitic changes.  EKG  normal sinus rhythm, left axis deviation.   Physical Exam  Pleasant middle-aged Caucasian male sitting up in bed not in distress. Carotid endarterectomy bandage noted in the left neck and upper chest. Head is nontraumatic. Neck is supple. Cardiac exam no murmur or gallop. Lungs are clear to auscultation.  Neurological exam awake alert serial Global aphasia follows on the occasional midline commands. Nonfluent speech speaks a few words and short sentences only. Unable to name or repeat. Extraocular movements appear full range. Blinks to threat more on the left than the right side. Right lower facial weakness. Tongue is midline. Dense flaccid right hemiplegia with 0/5 strength. Diminished sensation on the right hemibody. Right plantar is upgoing left is downgoing. Coordination and gait cannot  be tested.  ASSESSMENT [name] is a 66 y.o. male with a left brain infarct, secondary to left ICA stenosis s/p L CEA 12/09/2011 with rethrombosis and surgical site. Hematologist consulted; hypercoagulable workup underway. On aspirin 325 mg orally every day and heparin for secondary stroke prevention.  - hypertension - diabetes, elevated HbgA1c - OSA - hypertriglyceridemia  Hospital day # 1  TREATMENT/PLAN - Continue aspirin 325 mg orally every day for secondary stroke prevention. - Stop IV heparin due to risk of hemorrhage in acute stroke (will reconsider if hypercoagulable labs are abnormal). - ST swallow evaluation. Diet per ST. - therapy evaluations - SLP language/cognition eval -long discussion with patient's wife and 2 daughters as well as Dr. Hart [place] Re: his clinical condition,  prognosis and plan of care and answered questions.  Joaquin Music, ANP-BC, GNP-BC [facility] Pager: [phone] 12/10/2011 8:36 AM  Dr. Delia Heady, Stroke Center Medical Director, has personally reviewed chart, pertinent data, examined the patient and developed the plan of care.

## 2011-12-10 NOTE — Progress Notes (Signed)
Inpatient Diabetes Program Recommendations  AACE/ADA: New Consensus Statement on Inpatient Glycemic Control (2009)  Target Ranges:  Prepandial:   less than 140 mg/dL      Peak postprandial:   less than 180 mg/dL (1-2 hours)      Critically ill patients:  140 - 180 mg/dL   Reason for Visit: Results for ROSWELL, NDIAYE (MRN [id number]) as of 12/10/2011 10:13  Ref. Range 12/10/2011 00:26 12/10/2011 03:13 12/10/2011 04:46 12/10/2011 05:32 12/10/2011 08:29  Glucose-Capillary Latest Range: 70-99 mg/dL 147 (H) 829 (H) 562 (H) 203 (H) 177 (H)     Inpatient Diabetes Program Recommendations Insulin - Basal: Add Lantus 20 units daily (.2 units/kg). HgbA1C: A1C=7.5% indicating average CBG's prior to admission 165 mg/dL.  Note: Will follow.

## 2011-12-10 NOTE — Progress Notes (Signed)
Speech Language/Pathology Speech Language Pathology Evaluation Patient Details Name: Darrell [name] MRN: [id number] DOB: 13-Mar-1946 Today's Date: 12/10/2011  Problem List:  Patient Active Problem List  Diagnoses  . DIABETES MELLITUS, TYPE II  . HYPERLIPIDEMIA  . SLEEP APNEA, OBSTRUCTIVE, SEVERE  . HYPERTENSION  . VIRAL URI  . ASTHMA, ACUTE  . AUTONOMIC NEUROPATHY, DIABETIC  . Left carotid bruit  . Carotid artery plaque  . Occlusion and stenosis of carotid artery without mention of cerebral infarction   Past Medical History:  Past Medical History  Diagnosis Date  . Diabetes mellitus   . Sleep apnea     cpap      sleep study > 4 yrs  . Hypertension     dr Tawanna Cooler   Past Surgical History:  Past Surgical History  Procedure Date  . Hernia repair   . Heel spur surgery   . 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 At this time it is difficult to determine all areas of strength/weakness due to limitations with verbal expression and initiation, however pt will benefit from SLP services for continued differential diagnosis and treatment, as well as CIR consult for inpatient rehab after d/c.  SLP Recommendation/Assessment: Patient will need skilled Speech Lanaguage Pathology Services in the acute care venue to address identified deficits Problem List: Auditory comprehension;Attention;Verbal expression;Written expression;Reading comprehension Therapy Diagnosis: Aphasia;Apraxia;Cognitive Impairments;Speech and Language deficits Type of Aphasia: Global Plan Speech Therapy Frequency: min 2x/week Duration: 2 weeks Treatment/Interventions: Language facilitation;Environmental Economist;Internal/external  aids;Functional tasks;Multimodal communcation approach;SLP instruction and feedback;Compensatory strategies;Patient/family education Potential to Achieve Goals: Good Potential Considerations: Severity of impairments SLP Recommendations Recommendations for Other Services: Rehab consult Follow up Recommendations: Inpatient Rehab Equipment Recommended: None recommended by SLP Individuals Consulted Consulted and Agree with Results and Recommendations: Patient;Family member/caregiver Family Member Consulted : wife, 2 daughters  SLP Goals  SLP Goals Potential to Achieve Goals: Good Potential Considerations: Severity of impairments Progress/Goals/Alternative treatment plan discussed with pt/caregiver and they: Agree SLP Goal #1: Pt will sustain attention for 90 seconds to basic ADL with moderate cues from SLP. SLP Goal #1 - Progress: Not met SLP Goal #2: Pt will answer basic yes/no questions with 75% accuracy with moderate cues from SLP. SLP Goal #2 - Progress: Not met SLP Goal #3: Pt will follow basic one-step directions with max cues from SLP. SLP Goal #3 - Progress: Not met SLP Goal #4: Pt will complete automatic speech task with min cues from SLP. SLP Goal #4 - Progress: Not met  SLP Evaluation Prior Functioning Cognitive/Linguistic Baseline: Within functional limits Type of Home: House Lives With: Spouse Receives Help From: Family Vocation: Full time employment  Cognition Overall Cognitive Status: Impaired Arousal/Alertness: Lethargic Orientation Level: Oriented X4 Attention: Sustained (sustained attention for brief periods (20-30 seconds)) Sustained Attention: Impaired Sustained Attention Impairment: Verbal basic Executive Function: Initiating Initiating: Impaired Initiating Impairment: Verbal basic;Functional basic Behaviors: Perseveration (perseveration of head nod)  Comprehension Auditory Comprehension Overall Auditory Comprehension: Impaired Yes/No Questions:  Impaired Basic Biographical Questions: 26-50% accurate (50% accurate; responds with head nod yes to all ) Commands: Impaired One Step Basic Commands: 0-24% accurate (max assist required) Interfering Components: Attention;Visual impairments EffectiveTechniques: Visual/Gestural cues (contextual/visual/HOH cues for 1-step commands) Visual Recognition/Discrimination Discrimination: Exceptions to [facility] Common Objects: Unable to indentify (? comprehension vs initiation) Reading Comprehension Reading Status: Not tested  Expression Expression Primary Mode of Expression: Nonverbal - gestures (head nod, facial expressions) Verbal Expression Overall Verbal Expression: Impaired Initiation: Impaired Automatic Speech: Counting (1-10 (phonemic/visual cues); wife's phone # (written cues)) Naming: Impairment Confrontation: Impaired (severe global aphasia) Common Objects: Unable to indentify (? comprehension vs initiation) Pragmatics: Unable to assess Interfering Components: Attention Effective Techniques: Phonemic cues;Written cues Non-Verbal Means of Communication: Gestures (facial expressions) Other Verbal Expression Comments: Pt with limited verbal output with exception of occasional automatic speech ("yea", social greeting) Written Expression Written Expression: Not tested  Oral/Motor Oral Motor/Sensory Function Overall Oral Motor/Sensory Function: Impaired (difficult to assess due to global aphasia) Labial ROM: Reduced right Labial Symmetry: Abnormal symmetry right Facial ROM: Reduced right Facial Symmetry: Right droop Motor Speech Phonation: Normal Resonance: Within functional limits  Maxcine Ham 12/10/2011, 12:21 PM  Maxcine Ham, SLP Student

## 2011-12-11 DIAGNOSIS — I6330 Cerebral infarction due to thrombosis of unspecified cerebral artery: Secondary | ICD-10-CM | POA: Diagnosis not present

## 2011-12-11 DIAGNOSIS — I10 Essential (primary) hypertension: Secondary | ICD-10-CM | POA: Diagnosis not present

## 2011-12-11 DIAGNOSIS — E119 Type 2 diabetes mellitus without complications: Secondary | ICD-10-CM | POA: Diagnosis not present

## 2011-12-11 DIAGNOSIS — I6529 Occlusion and stenosis of unspecified carotid artery: Secondary | ICD-10-CM | POA: Diagnosis not present

## 2011-12-11 DIAGNOSIS — I6350 Cerebral infarction due to unspecified occlusion or stenosis of unspecified cerebral artery: Secondary | ICD-10-CM | POA: Diagnosis not present

## 2011-12-11 LAB — GLUCOSE, CAPILLARY
Glucose-Capillary: 163 mg/dL — ABNORMAL HIGH (ref 70–99)
Glucose-Capillary: 182 mg/dL — ABNORMAL HIGH (ref 70–99)
Glucose-Capillary: 191 mg/dL — ABNORMAL HIGH (ref 70–99)
Glucose-Capillary: 194 mg/dL — ABNORMAL HIGH (ref 70–99)
Glucose-Capillary: 202 mg/dL — ABNORMAL HIGH (ref 70–99)

## 2011-12-11 LAB — POCT ACTIVATED CLOTTING TIME
Activated Clotting Time: 127 seconds
Activated Clotting Time: 155 seconds
Activated Clotting Time: 138 seconds

## 2011-12-11 LAB — LUPUS ANTICOAGULANT PANEL
DRVVT: 43.6 secs — ABNORMAL HIGH (ref 34.1–42.2)
Lupus Anticoagulant: DETECTED — AB
PTTLA 4:1 Mix: 46.6 secs — ABNORMAL HIGH (ref 28.0–43.0)
PTTLA Confirmation: 9.8 secs — ABNORMAL HIGH (ref <8.0)

## 2011-12-11 LAB — FACTOR 5 LEIDEN

## 2011-12-11 LAB — PROTEIN S, TOTAL
Protein S Ag, Total: 90 % (ref 60–150)
Protein S Ag, Total: 107 % (ref 60–150)

## 2011-12-11 LAB — HEPARIN INDUCED THROMBOCYTOPENIA PNL
UFH Low Dose 0.1 IU/mL: 0 % Release
UFH SRA Result: NEGATIVE

## 2011-12-11 LAB — PROTEIN C, TOTAL: Protein C, Total: 108 % (ref 72–160)

## 2011-12-11 NOTE — Progress Notes (Signed)
Speech Language/Pathology Aphasia and Dysphagia Treatment Patient Details Name: Darrell [name] MRN: [id number] DOB: 06-Jun-1946 Today's Date: 12/11/2011   Aphasia:    SLP Assessment/Plan/Recommendation Pt f/u for aphasia and dysphagia s/p left CVA.  Presents today with improved language function, now producing short sentences and showing improved recognition of speech errors.  Wife and daughter present for session.  Educated re: nature of aphasia and provided with strategies to facilitate communication success.   SLP Goals   SLP Goal #1: Pt will sustain attention for 90 seconds to basic ADL with moderate cues from SLP.  SLP Goal #1 - Progress: Pt with improved sustained attn for 60 sec with min verbal cues SLP Goal #2: Pt will answer basic yes/no questions with 75% accuracy with moderate cues from SLP.  SLP Goal #2 - Progress: Pt with perseveratory "yes" responses, with difficulty recognizing errored response.  Required max verbal/written cues to differentiate response and provide target reliably SLP Goal #3: Pt will follow basic one-step directions with max cues from SLP.  SLP Goal #3 - Progress: Total assist for one-step commands; apraxia interfering component SLP Goal #4: Pt will complete automatic speech task with min cues from SLP.  SLP Goal #4 - Progress: Pt requires phonemic cues to initiate automatic/overlearned speech acts (days of week, months of year)  Overall, showing improved spontaneous generation of speech; excellent repetition - pt self-cueing by reauditorizing others' phrases to assist with initiation.  Benefits from yes/no tagging, extra time to process, and visual cues to faciliate understanding.  Receptive communication deficits are > expressive at this time (pt no longer global aphasic - transitioning to mixed aphasia).  Dysphagia:  Pt NPO post MBS yesterday.  Today, provided with trial POs to determine improved readiness for diet.  Pt with improved bolus awareness and  active mastication of ice; improved timeliness of swallow trigger with increased hyolaryngeal movement per palpation.  Persisting intermittent cough after swallow, indicating possible airway compromise.  Pt ready for repeat MBS tomorrow, 2/16.  Discussed with family/pt.  PLAN:   Continue  Aphasia treatment Continue NPO status Repeat MBS 12/12/11 : PLEASE ORDER  Amaris Delafuente L. Samson Frederic, [place] CCC/SLP Pager [phone]     Blenda Mounts Laurice 12/11/2011, 11:35 AM

## 2011-12-11 NOTE — Progress Notes (Deleted)
  Echocardiogram 2D Echocardiogram has been performed.  Juanita Laster Aubriegh Minch 12/11/2011, 2:28 PM

## 2011-12-11 NOTE — Consult Note (Signed)
Physical Medicine and Rehabilitation Consult Reason for Consult: Right sided weakness, speech difficulties Referring Phsyician: Dr. Hart Rochester    HPI: Darrell [name] is an 66 y.o. 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 Hart Rochester. In recovery, patient with decreased LOC and inability to move right side.  Patient taken back to OR emergently for exploration of L-CEA with thrombectomy of meniscectomized surface and distal ICA. Neurology and Hem/Onc consulted for input.  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 Hypercoagulation workup in progress.  Patient on ASA per neurology recommendations. PT/ST evaluations done yesterday. MBS done today and patient  To continue on NPO status due to residuals, global aphasia, high aspiration risk due to poor awareness, and oropharyngeal dysphagia.  Patient with left inattention, continued right hemiparesis and difficulty following commands.  MD, PT recommending CIR.  Review of Systems  Unable to perform ROS: language  Eyes:       Recent problems with right vision "seeing a black spot centrally'-per wife.  Respiratory: Negative for shortness of breath.   Cardiovascular: Negative for chest pain.   Past Medical History  Diagnosis Date   boderline glaucoma   . Diabetes mellitus   . Sleep apnea     cpap      sleep study > 4 yrs  . Hypertension     dr Tawanna Cooler   Past Surgical History  Procedure Date  . Hernia repair   . Heel spur surgery   . Heer surgery   . Endarterectomy 12/09/2011    Procedure: ENDARTERECTOMY CAROTID;  Surgeon: [name], MD;  Location: [facility] OR;  Service: Vascular;  Laterality: Left;  Left Carotid endarterectomy with dacron patch angioplasty   Family History  Problem Relation Age of Onset  . Heart disease Father    Social History:  Married.  Per reports that he quit smoking about 10 years ago. His smoking use  included Cigarettes. He has a 75 pack-year smoking history. He has never used smokeless tobacco. He reports that he drinks alcohol. He reports that he does not use illicit drugs.   Allergies  Allergen Reactions  . Oxycodone-Acetaminophen     REACTION: feels sick    Prior to Admission medications   Medication Sig Start Date End Date Taking? Authorizing Provider  amitriptyline (ELAVIL) 10 MG tablet Take 10 mg by mouth at bedtime. 11/30/11  Yes Evette Georges, MD  aspirin 81 MG EC tablet Take 81 mg by mouth daily.     Yes Historical Provider, MD  fenofibrate micronized (LOFIBRA) 200 MG capsule Take 200 mg by mouth daily before breakfast. 11/30/11  Yes Evette Georges, MD  glipiZIDE (GLUCOTROL XL) 10 MG 24 hr tablet Take 10 mg by mouth 2 (two) times daily.   Yes Historical Provider, MD  lisinopril (PRINIVIL,ZESTRIL) 20 MG tablet Take 20 mg by mouth daily. 11/30/11  Yes Evette Georges, MD  metFORMIN (GLUCOPHAGE) 1000 MG tablet Take 1,000 mg by mouth 2 (two) times daily with a meal.  11/30/11  Yes Evette Georges, MD  traMADol (ULTRAM) 50 MG tablet Take 1 tablet (50 mg total) by mouth every 6 (six) hours as needed for pain. 12/09/11 12/19/11  [name], PA   Scheduled Medications:    . antiseptic oral rinse  15 mL Mouth Rinse q12n4p  . aspirin EC  325 mg Oral Daily   Or  . aspirin  300  mg Rectal Daily  . chlorhexidine  15 mL Mouth Rinse BID  . insulin aspart  0-15 Units Subcutaneous Q4H  . DISCONTD: aspirin EC  325 mg Oral Daily   PRN MED's: hydrALAZINE, HYDROmorphone (DILAUDID) injection, labetalol, metoprolol, ondansetron, phenol Home: Home Living Lives With: Spouse Receives Help From: Family Type of Home: House Home Layout: Two level;Bed/bath upstairs Home Access: Stairs to enter Secretary/administrator of Steps: 5 Home Adaptive Equipment: Straight cane  Functional History: Prior Function Level of Independence: Independent with basic ADLs;Independent with homemaking  with ambulation;Independent with gait;Independent with transfers Driving: Yes Vocation: Full time employment Functional Status:  Mobility: Bed Mobility Bed Mobility: Yes Rolling Right: 1: +2 Total assist (20%) Rolling Right Details (indicate cue type and reason): maxA to flex knees and faciliation to rotate trunk (trunk very rigid) Supine to Sit: 1: +2 Total assist (20%) Supine to Sit Details (indicate cue type and reason): pt attempted to initiate moving legs off bed but very stiff hips; facilation to bring knees/hips into flexion and bring off bed as well as elevate trunk Sit to Supine: 1: +2 Total assist (+3totalpt10%) Transfers Transfers: Yes Sit to Stand: 1: +2 Total assist (40%) Sit to Stand Details (indicate cue type and reason): Sit->stand x3 with pt initiating stand with trunk/hip flex but unable to follow through without mod-max faciliation at hips for extenion and anterior translation Stand to Sit: 1: +2 Total assist (45%) Stand to Sit Details: reaches back with LUE with cueing; faciliation to control descent Ambulation/Gait Ambulation/Gait: No    ADL:    Cognition: Cognition Overall Cognitive Status: Impaired Arousal/Alertness: Awake/alert Orientation Level: Other (Comment) (unable to assess) Attention: Sustained (sustained attention for brief periods (20-30 seconds)) Sustained Attention: Impaired Sustained Attention Impairment: Verbal basic Executive Function: Initiating Initiating: Impaired Initiating Impairment: Verbal basic;Functional basic Behaviors: Perseveration (perseveration of head nod) Cognition Arousal/Alertness: Awake/alert Overall Cognitive Status: Impaired Attention: Impaired Current Attention Level: Selective Orientation Level: Other (Comment) (unable to assess) Following Commands: Follows one step commands inconsistently (follows very simple direct one word commands) Awareness of Errors: Decreased awareness of errors made Decreased Awareness  of Errors: Assistance required to correct errors made Awareness of Errors - Other Comments: pt answering questions with nod (often times an inappropriate nod) Awareness of Deficits: Decreased awareness of deficits Problem Solving: Requires assistance for problem solving Cognition - Other Comments: demonstrates neglect of the right, can turn head to right with cueing  Blood pressure 125/75, pulse 79, temperature 98.1 F (36.7 C), temperature source Oral, resp. rate 23, height 5' 10.87" (1.8 m), weight 110.9 kg (244 lb 7.8 oz), SpO2 95.00%.  Physical Exam  Constitutional: He appears well-developed and well-nourished.  HENT:  Head: Normocephalic.  Eyes: Pupils are equal, round, and reactive to light.  Neck:       Dressing left neck with edema.  Cardiovascular: Normal rate and regular rhythm.   Pulmonary/Chest: Effort normal and breath sounds normal.  Abdominal: Soft. Bowel sounds are normal.  Neurological: He is alert.       Fatigued appearing. Able to answer basic Y/N  Questions with occasional cues.  Right inattention Visual deficit.  Dense right hemiparesis. I saw no involuntary movements. He did wince to pain slightly. Verbal apraxia with difficulty follow commands. Expressive and receptive language deficits. Right tongue deviation. Reflexes are hyperactive at 3+ on the right. Toes are upward pointing on the right.  Skin: Skin is warm.  Psychiatric: His speech is slurred.    Results for orders placed during the hospital  encounter of 12/09/11 (from the past 24 hour(s))  ANTITHROMBIN III     Status: Normal   Collection Time   12/10/11 11:05 AM      Component Value Range   AntiThromb III Func 77  75 - 120 (%)  HOMOCYSTEINE, SERUM     Status: Normal   Collection Time   12/10/11 11:05 AM      Component Value Range   Homocysteine-Norm 14.0  4.0 - 15.4 (umol/L)  GLUCOSE, CAPILLARY     Status: Abnormal   Collection Time   12/10/11 12:15 PM      Component Value Range   Glucose-Capillary  171 (*) 70 - 99 (mg/dL)   Comment 1 Documented in Chart     Comment 2 Notify RN    GLUCOSE, CAPILLARY     Status: Abnormal   Collection Time   12/10/11  4:04 PM      Component Value Range   Glucose-Capillary 164 (*) 70 - 99 (mg/dL)   Comment 1 Notify RN    GLUCOSE, CAPILLARY     Status: Abnormal   Collection Time   12/10/11  8:12 PM      Component Value Range   Glucose-Capillary 153 (*) 70 - 99 (mg/dL)   Comment 1 Documented in Chart     Comment 2 Notify RN    GLUCOSE, CAPILLARY     Status: Abnormal   Collection Time   12/11/11 12:08 AM      Component Value Range   Glucose-Capillary 194 (*) 70 - 99 (mg/dL)  GLUCOSE, CAPILLARY     Status: Abnormal   Collection Time   12/11/11  4:17 AM      Component Value Range   Glucose-Capillary 191 (*) 70 - 99 (mg/dL)  GLUCOSE, CAPILLARY     Status: Abnormal   Collection Time   12/11/11  7:38 AM      Component Value Range   Glucose-Capillary 202 (*) 70 - 99 (mg/dL)   Comment 1 Notify RN     Ct Head Wo Contrast  12/09/2011  *RADIOLOGY REPORT*  Clinical Data: Right-sided weakness.  Recovering from left carotid surgery.  CT HEAD WITHOUT CONTRAST  Technique:  Contiguous axial images were obtained from the base of the skull through the vertex without contrast.  Comparison: None.  Findings: The brain shows mild age related atrophy.  There are mild changes of small vessel disease in the deep white matter.  There are a few old lacunar infarctions in the basal ganglia regions.  No identifiably acute infarction, mass lesion, hemorrhage, hydrocephalus or extra-axial collection.  Atherosclerosis effects the major vessels at the base of the brain.  Calvarium is unremarkable.  Sinuses are clear.  IMPRESSION: No acute or reversible findings.  Age related atrophy.  Mild chronic small vessel changes as outlined above.  Atherosclerosis.  Original Report Authenticated By: Thomasenia Sales, M.D.   Mr Maxine Glenn Head Wo Contrast  12/09/2011  *RADIOLOGY REPORT*  Clinical Data:   Status post left carotid endarterectomy.  Decreased responsiveness.  Right sided weakness.  MRI HEAD WITHOUT CONTRAST MRA HEAD WITHOUT CONTRAST  Technique:  Multiplanar, multiecho pulse sequences of the brain and surrounding structures were obtained without intravenous contrast. Angiographic images of the head were obtained using MRA technique without contrast.  Comparison:  CT head without contrast 12/09/2011.  MRI HEAD  Findings:  The diffusion weighted images demonstrate a left MCA / PCA watershed territory infarct.  There is some posterior watershed territory cortical infarction as well.  A focal area of non hemorrhagic infarction is evident within the anterior left basal ganglia.  Mild T2 and FLAIR hyperintensity is associated with these areas.  No hemorrhage is present.  The patient exhibits a mild periventricular and subcortical white matter changes otherwise.  There are remote lacunar infarcts of the lentiform nucleus bilaterally.  Flow is present in the major intracranial arteries.  The globes and orbits are intact.  The paranasal sinuses and mastoid air cells are clear.  IMPRESSION:  1.  Non hemorrhagic left cerebral infarcts within a watershed distribution both anteriorly and posteriorly. 2.  Additional focal non hemorrhagic infarct within the anterior left basal ganglia. 3.  Remote lacunar infarcts of the lentiform nucleus bilaterally. The number for mild periventricular and subcortical white matter changes are advanced for age.  This likely reflecting sequelae of chronic microvascular ischemia.  MRA HEAD  Findings: The internal carotid arteries are within normal limits bilaterally.  The A1 and M1 segments are normal.  The anterior communicating artery is patent.  The MCA bifurcations are within normal limits.  The MCA branch vessels demonstrate minimal distal small vessel attenuation.  The vertebral arteries are codominant.  The left PICA origin is visualized and within normal limits.  The right PICA is  not visualized.  Both posterior cerebral arteries originate from the basilar tip.  There is some attenuation of distal PCA branch vessels.  IMPRESSION:  1.  Minimal distal small vessel disease. 2.  No significant proximal stenosis, aneurysm, or branch vessel occlusion.  Original Report Authenticated By: Jamesetta Orleans. MATTERN, M.D.   Mr Brain Wo Contrast  12/09/2011  *RADIOLOGY REPORT*  Clinical Data:  Status post left carotid endarterectomy.  Decreased responsiveness.  Right sided weakness.  MRI HEAD WITHOUT CONTRAST MRA HEAD WITHOUT CONTRAST  Technique:  Multiplanar, multiecho pulse sequences of the brain and surrounding structures were obtained without intravenous contrast. Angiographic images of the head were obtained using MRA technique without contrast.  Comparison:  CT head without contrast 12/09/2011.  MRI HEAD  Findings:  The diffusion weighted images demonstrate a left MCA / PCA watershed territory infarct.  There is some posterior watershed territory cortical infarction as well.  A focal area of non hemorrhagic infarction is evident within the anterior left basal ganglia.  Mild T2 and FLAIR hyperintensity is associated with these areas.  No hemorrhage is present.  The patient exhibits a mild periventricular and subcortical white matter changes otherwise.  There are remote lacunar infarcts of the lentiform nucleus bilaterally.  Flow is present in the major intracranial arteries.  The globes and orbits are intact.  The paranasal sinuses and mastoid air cells are clear.  IMPRESSION:  1.  Non hemorrhagic left cerebral infarcts within a watershed distribution both anteriorly and posteriorly. 2.  Additional focal non hemorrhagic infarct within the anterior left basal ganglia. 3.  Remote lacunar infarcts of the lentiform nucleus bilaterally. The number for mild periventricular and subcortical white matter changes are advanced for age.  This likely reflecting sequelae of chronic microvascular ischemia.  MRA  HEAD  Findings: The internal carotid arteries are within normal limits bilaterally.  The A1 and M1 segments are normal.  The anterior communicating artery is patent.  The MCA bifurcations are within normal limits.  The MCA branch vessels demonstrate minimal distal small vessel attenuation.  The vertebral arteries are codominant.  The left PICA origin is visualized and within normal limits.  The right PICA is not visualized.  Both posterior cerebral arteries originate from the basilar  tip.  There is some attenuation of distal PCA branch vessels.  IMPRESSION:  1.  Minimal distal small vessel disease. 2.  No significant proximal stenosis, aneurysm, or branch vessel occlusion.  Original Report Authenticated By: Jamesetta Orleans. MATTERN, M.D.   Dg Swallowing Func-no Report  12/10/2011  CLINICAL DATA: dysphagia   FLUOROSCOPY FOR SWALLOWING FUNCTION STUDY:  Fluoroscopy was provided for swallowing function study, which was  administered by a speech pathologist.  Final results and recommendations  from this study are contained within the speech pathology report.      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 Does the need for close, 24 hr/day medical supervision in concert with the patient's rehab needs make it unreasonable for this patient to be served in a less intensive setting? Yes 2. Co-Morbidities requiring supervision/potential complications: Diabetes, hypertension, asthma 3. Due to bladder management, bowel management, safety, skin/wound care, disease management, medication administration, pain management and patient education, does the patient require 24 hr/day rehab nursing? Yes 4. Does the patient require coordinated care of a physician, rehab nurse, PT (1-2 hrs/day, 5 days/week), OT (1-2 hrs/day, 5 days/week) and SLP (1-2 hrs/day, 5 days/week) to address physical and functional deficits in the context of the above medical diagnosis(es)? Yes and  Potentially Addressing deficits in the following areas: balance, endurance, locomotion, strength, transferring, bowel/bladder control, bathing, dressing, feeding, grooming, toileting, cognition, speech, language, swallowing and psychosocial support 5. Can the patient actively participate in an intensive therapy program of at least 3 hrs of therapy per day at least 5 days per week? Potentially 6. The potential for patient to make measurable gains while on inpatient rehab is good 7. Anticipated functional outcomes upon discharge from inpatients are minimal assistance PT, minimal to moderate assistance OT, minimal to moderate assistance SLP 8. Estimated rehab length of stay to reach the above functional goals is: 3 weeks plus 9. Does the patient have adequate social supports to accommodate these discharge functional goals? Yes and Potentially 10. Anticipated D/C setting: Home 11. Anticipated post D/C treatments: Outpt therapy 12. Overall Rehab/Functional Prognosis: excellent  RECOMMENDATIONS: This patient's condition is appropriate for continued rehabilitative care in the following setting: CIR Patient has agreed to participate in recommended program. Yes and Potentially Note that insurance prior authorization may be required for reimbursement for recommended care.  Comment: Rehabilitation nurse to followup.   Ranelle Oyster M.D. 12/11/2011

## 2011-12-11 NOTE — Progress Notes (Signed)
Heme/Onc  Lab results for hypercoagulable state reviewed and discussed with my partners. Only positive thus far is lupus anticoagulant, which is not very reliable particularly in proximity to heparin; anticardiolipin antibody and B2 glycoprotein are still pending. Otherwise ATIII in normal range at 77, Protein C and S out from the heparin ok, HIT negative/ platelet 208k, homocysteine and Factor V Leiden negative. Patient is on ASA now. I have discussed with wife at bedside now. Patient is sleeping but has made further improvement today per wife, who is very appreciative of care and of prompt information updates by physicians.  I will follow up labs over weekend. Please call between visits if needed.  Marlynn Hinckley P. Darrold Span, MD [phone]

## 2011-12-11 NOTE — Progress Notes (Addendum)
VASCULAR AND VEIN SURGERY POST - OP CEA PROGRESS NOTE  Date of Surgery: 12/09/2011 Surgeon: Moishe Spice): Darrell Gip, MD 2 Days Post-Op left Carotid Endarterectomy .  HPI: Darrell [name] is a 66 y.o. male who is 2 Days Post-Op left Carotid Endarterectomy . Patient is progressing slowly.Patient denies headache; Patient tolerating secretions without difficulty difficulty swallowing; Seen by speech yesterday: No overt s/s of aspiration at bedside observed with thin liquids, however right anterior spillage and multiple swallows as well as a change in facial expression are suggestive of difficulty protecting airway. Given above, as well as increased risk of aspiration due to post-op swelling and acute CVA, recommend MBSS to rule out silent aspiration.  Risk for Aspiration: Moderate   Significant Diagnostic Studies: CBC Lab Results  Component Value Date   WBC 14.7* 12/10/2011   HGB 11.7* 12/10/2011   HCT 34.7* 12/10/2011   MCV 86.3 12/10/2011   PLT 208 12/10/2011    BMET    Component Value Date/Time   NA 134* 12/10/2011 0425   K 3.9 12/10/2011 0425   CL 102 12/10/2011 0425   CO2 21 12/10/2011 0425   GLUCOSE 208* 12/10/2011 0425   GLUCOSE 141* 10/05/2006 0904   BUN 13 12/10/2011 0425   CREATININE 0.78 12/10/2011 0425   CALCIUM 8.6 12/10/2011 0425   GFRNONAA >90 12/10/2011 0425   GFRAA >90 12/10/2011 0425    COAG Lab Results  Component Value Date   INR 1.05 12/09/2011   INR 1.16 12/09/2011   INR 1.07 12/08/2011   No results found for this basename: PTT      Intake/Output Summary (Last 24 hours) at 12/11/11 0734 Last data filed at 12/11/11 0700  Gross per 24 hour  Intake   2318 ml  Output   2577 ml  Net   -259 ml  JP 40cc over 12 hours  Physical Exam:  BP Readings from Last 3 Encounters:  12/11/11 117/82  12/11/11 117/82  12/11/11 117/82   Temp Readings from Last 3 Encounters:  12/11/11 97.4 F (36.3 C) Oral  12/11/11 97.4 F (36.3 C) Oral  12/11/11 97.4 F (36.3 C)  Oral   SpO2 Readings from Last 3 Encounters:  12/11/11 95%  12/11/11 95%  12/11/11 95%   Pulse Readings from Last 3 Encounters:  12/11/11 89  12/11/11 89  12/11/11 89    Pt is Awake and alert but tires easily Speech is coherent but limited - left Neck Wound is healing well - JP with serrous drainage Patient with Negative tongue deviation and Negative facial droop Pt has minimal movement on the right but now will look to right side when you ask him to move limbs  Following commands on the left better today  Assessment/Plan: : SCOUT GUMBS is a 66 y.o. male is S/P Carotid endarterectomy and post -op occlusion and redo with interposition GSV graft Pt showing slight neuro improvement Speech to re-evaluate swallow Will leave JP drain  PT/OT/CIR Vital signs stable Foley cath to remain in sec to stroke and pt inability to communicate needs  Darrell [name] [phone] 12/11/2011 7:34 AM   I agree with above. Neuro status seems to be slightly better today. Patient remains alert. Answer some questions and follows simple commands. No movement on the right side but movement reported and right lower extremity. Left neck wound doing well. JP drain 40 cc past 12 hours-will DC JP drain in a.m.  Swallowing study revealed patient still at high-risk for aspiration-we'll repeat swallowing study in a.m.  and then make decisions regarding nutrition Keep patient in surgical intensive care unit today Will discuss this with wife and daughters today Patient on no intravenous anticoagulation-aspirin only

## 2011-12-11 NOTE — Progress Notes (Signed)
Stroke Team Progress Note  HISTORY Darrell [name] is an 66 y.o. male patient presented to [facility] for L CEA. Post operatively patient was noted to become unresponsive and not moving right side. Patient was brought back to the ED to further evaluation there was thrombus throughout the entire endarterectomy site. This was removed and patient was brought back to PACU. Neurology was ask to evaluate due to not moving right side, not able to follow commands and question of stroke. He was last seen normal at 1212. Not a tPA candidate secondary to recent carotid surgery.  SUBJECTIVE Daughter and wife at bedside. 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 sodium chloride 100 mL (12/10/11 1800)  . DOPamine    . DISCONTD: heparin 1,800 Units/hr (12/10/11 0800)   Medications    . antiseptic oral rinse  15 mL Mouth Rinse q12n4p  . aspirin EC  325 mg Oral Daily   Or  . aspirin  300 mg Rectal Daily  . chlorhexidine  15 mL Mouth Rinse BID  . insulin aspart  0-15 Units Subcutaneous Q4H  . DISCONTD: aspirin EC  325 mg Oral Daily  PRN hydrALAZINE, HYDROmorphone (DILAUDID) injection, labetalol, metoprolol, ondansetron, phenol  Diet:  NPO Activity: not designated in orders DVT Prophylaxis:  SCDs   Significant Diagnostic Studies: CBC    Component Value Date/Time   WBC 14.7* 12/10/2011 0425   RBC 4.02* 12/10/2011 0425   HGB 11.7* 12/10/2011 0425   HCT 34.7* 12/10/2011 0425   PLT 208 12/10/2011 0425   MCV 86.3 12/10/2011 0425   MCH 29.1 12/10/2011 0425   MCHC 33.7 12/10/2011 0425   RDW 13.4 12/10/2011 0425   LYMPHSABS 0.5* 12/09/2011 1927   MONOABS 0.4 12/09/2011 1927   EOSABS 0.0 12/09/2011 1927   BASOSABS 0.0 12/09/2011 1927   CMP    Component Value Date/Time   NA 134* 12/10/2011 0425   K 3.9 12/10/2011 0425   CL 102 12/10/2011 0425   CO2 21 12/10/2011 0425   GLUCOSE 208* 12/10/2011 0425   GLUCOSE 141* 10/05/2006 0904   BUN 13 12/10/2011 0425   CREATININE 0.78 12/10/2011 0425   CALCIUM 8.6 12/10/2011 0425   PROT 7.2 12/08/2011 1209   ALBUMIN 4.0 12/08/2011 1209   AST 29 12/08/2011 1209   ALT 29 12/08/2011 1209   ALKPHOS 39 12/08/2011 1209   BILITOT 0.4 12/08/2011 1209   GFRNONAA >90 12/10/2011 0425   GFRAA >90 12/10/2011 0425   COAGS Lab Results  Component Value Date   INR 1.05 12/09/2011   INR 1.16 12/09/2011   INR 1.07 12/08/2011   Lipid Panel    Component Value Date/Time   CHOL 116 12/10/2011 0425   TRIG 228*  12/10/2011 0425   HDL 19* 12/10/2011 0425   CHOLHDL 6.1 12/10/2011 0425   VLDL 46* 12/10/2011 0425   LDLCALC 51 12/10/2011 0425   gbA1C  Lab Results  Component Value Date   HGBA1C 7.5* 12/09/2011   Urine Drug Screen  No results found for this basename: labopia,  cocainscrnur,  labbenz,  amphetmu,  thcu,  labbarb    Alcohol Level No results found for this basename: eth   CT of the brain   No acute or reversible findings.  Age related atrophy.  Mild chronic small vessel changes as outlined above.  Atherosclerosis.   MRI of the brain    1.  Non hemorrhagic left cerebral infarcts within a watershed distribution both anteriorly and posteriorly. 2.  Additional focal non hemorrhagic infarct within the anterior left basal ganglia. 3.  Remote lacunar infarcts of the lentiform nucleus bilaterally. The number for mild periventricular and subcortical  white matter changes are advanced for age.  This likely reflecting sequelae of chronic microvascular ischemia.   MRA of the brain  1.  Minimal distal small vessel disease. 2.  No significant proximal stenosis, aneurysm, or branch vessel occlusion.   2D Echocardiogram  ordered   Carotid Doppler  Left 40-59%, right 0-39%  CXR  Enlargement of cardiac silhouette. Chronic bronchitic changes.  EKG  normal sinus rhythm, left axis deviation.   Physical Exam  Pleasant middle-aged Caucasian male sitting up in bed not in distress. Carotid endarterectomy bandage noted in the left neck and upper chest. Head is nontraumatic. Neck is supple. Cardiac exam no murmur or gallop. Lungs are clear to auscultation.  Neurological exam awake alert.mild global aphasia follows   midline commands. Nonfluent speech speaks a few   short sentences only. Unable to name or repeat. Extraocular movements appear full range. Blinks to threat more on the left than the right side. Right lower facial weakness. Tongue is midline. Dense flaccid right upper extremity monoplegia with 0/5 strength.the right lower extremity 3/5 strength Diminished sensation on the right hemibody. Right plantar is upgoing left is downgoing. Coordination and gait cannot be tested.  ASSESSMENT [name] is a 66 y.o. male with a left brain infarct, secondary to left ICA stenosis s/p L CEA 12/09/2011 with rethrombosis and surgical site. Hematologist consulted; hypercoagulable workup underway. On aspirin 325 mg orally every day for secondary stroke prevention. Inpatient rehab has been consulted.  - hypertension - diabetes, elevated HbgA1c - OSA - hypertriglyceridemia - dysphagia.  Hospital day # 2  TREATMENT/PLAN - ST swallow evaluation with MBSS. Diet per ST. - OOB - therapy evaluations - follow up hypercoagulable labs - Dr. Pearlean Brownie discussed care with wife and daughter  Joaquin Music, ANP-BC, GNP-BC [facility] Pager:  [phone] 12/11/2011 7:57 AM  Dr. Delia Heady, Stroke Center Medical Director, has personally reviewed chart, pertinent data, examined the patient and developed the plan of care.

## 2011-12-11 NOTE — Progress Notes (Addendum)
Nutrition Follow-up  Pt s/p Modified Barium Swallow Procedure 2/14 -- SLP recommending NPO status due to high aspiration risk based on cognitive status. Noted for repeat MBS tomorrow, 2/16.  Diet Order:  NPO  Meds: Scheduled Meds:   . antiseptic oral rinse  15 mL Mouth Rinse q12n4p  . aspirin EC  325 mg Oral Daily   Or  . aspirin  300 mg Rectal Daily  . chlorhexidine  15 mL Mouth Rinse BID  . insulin aspart  0-15 Units Subcutaneous Q4H   Continuous Infusions:   . sodium chloride 100 mL (12/10/11 1800)  . DOPamine     PRN Meds:.hydrALAZINE, HYDROmorphone (DILAUDID) injection, labetalol, metoprolol, ondansetron, phenol  Labs:  CMP     Component Value Date/Time   NA 134* 12/10/2011 0425   K 3.9 12/10/2011 0425   CL 102 12/10/2011 0425   CO2 21 12/10/2011 0425   GLUCOSE 208* 12/10/2011 0425   GLUCOSE 141* 10/05/2006 0904   BUN 13 12/10/2011 0425   CREATININE 0.78 12/10/2011 0425   CALCIUM 8.6 12/10/2011 0425   PROT 7.2 12/08/2011 1209   ALBUMIN 4.0 12/08/2011 1209   AST 29 12/08/2011 1209   ALT 29 12/08/2011 1209   ALKPHOS 39 12/08/2011 1209   BILITOT 0.4 12/08/2011 1209   GFRNONAA >90 12/10/2011 0425   GFRAA >90 12/10/2011 0425     Intake/Output Summary (Last 24 hours) at 12/11/11 1250 Last data filed at 12/11/11 1200  Gross per 24 hour  Intake   2400 ml  Output   2312 ml  Net     88 ml    CBG (last 3)   Basename 12/11/11 1212 12/11/11 0738 12/11/11 0417  GLUCAP 182* 202* 191*    Weight Status:  110.9 kg (2/13) -- no new weight available  Re-estimated needs:  2000-2200 kcals, 105-115 gm protein  Nutrition Dx:  Inadequate Oral Intake, ongoing  Goal:  Oral intake vs EN initiation to meet >90% of estimated nutrition needs to optimize nutritional status, currently unmet Monitor: PO diet advancement, EN initiation, weight, labs, I/O's  Intervention:    Await repeat MBS results/SLP recommendations  If EN warranted, recommend initiation of Jevity 1.2 formula -- initiate  at 15 ml/hr, advance 10 ml every 4 hours as tolerated until goal rate of 75 ml/hr reached to provide 2160 kcals, 100 gm protein, 1453 ml of free water  RD to follow for nutrition care plan  Alger Memos Pager #:  [phone]

## 2011-12-11 NOTE — Progress Notes (Signed)
Physical Therapy Treatment Patient Details Name: Darrell [name] MRN: [id number] DOB: 1946-07-04 Today's Date: 12/11/2011  PT Assessment/Plan  PT - Assessment/Plan Comments on Treatment Session: Pt s/p L CEA and L CVA. Pt able to progress with sitting balance and transferring to chair today. Spouse and dgtr present throughout and educated for [facility] and command following over weekend to maximize pt function. Pt able to track across midline with increased trials but continues to follow 1 step commands grossly 20% of the time with repetition and multimodal cueing. Will continue to follow. PT Plan: Discharge plan remains appropriate Follow Up Recommendations: Inpatient Rehab PT Goals  Acute Rehab PT Goals Pt will Roll Supine to Right Side: with mod assist PT Goal: Rolling Supine to Right Side - Progress: Revised due to lack of progress Pt will Roll Supine to Left Side: with mod assist PT Goal: Rolling Supine to Left Side - Progress: Revised due to lack of progress PT Goal: Supine/Side to Sit - Progress: Progressing toward goal PT Goal: Sit at Edge Of Bed - Progress: Progressing toward goal PT Goal: Sit to Supine/Side - Progress: Progressing toward goal PT Goal: Sit to Stand - Progress: Progressing toward goal PT Goal: Stand to Sit - Progress: Progressing toward goal PT Transfer Goal: Bed to Chair/Chair to Bed - Progress: Progressing toward goal PT Goal: Stand - Progress: Progressing toward goal Pt will Perform Home Exercise Program: with min assist PT Goal: Perform Home Exercise Program - Progress: Revised due to lack of progress  PT Treatment Precautions/Restrictions  Precautions Precautions: Fall Precaution Comments: pt pushes R in standing Restrictions Weight Bearing Restrictions: No Mobility (including Balance) Bed Mobility Rolling Right: 1: +2 Total assist;Patient percentage (comment);With rail (pt=10%) Rolling Right Details (indicate cue type and reason): facilitation with  verbal and hand over hand cueing to initiate and complete transfer Right Sidelying to Sit: HOB elevated (comment degrees);1: +2 Total assist;Patient percentage (comment) (pt=10% , HOB 20degrees) Right Sidelying to Sit Details (indicate cue type and reason): pt resisting bringing legs off bed and pulled LLE back onto bed 2x before able to leave it down to facilitate trunk and hips into sitting Transfers Sit to Stand: From elevated surface;From bed;Patient percentage (comment);1: +2 Total assist (pt =30%) Sit to Stand Details (indicate cue type and reason): facilitation for anterior weight shift, RLE blocked, with buckling in full standing and assist to achieve standing. Once standing pt pushes R with max assist to shift weight. Stand to Sit: 1: +2 Total assist;To chair/3-in-1 (pt=30%) Stand to Sit Details: assist to control descent reaching with LUE for chair Stand Pivot Transfers: From elevated surface;1: +2 Total assist (pt=30%) Stand Pivot Transfer Details (indicate cue type and reason): pivot to L with facilitation to advance LLE and reach with LUE no activation of moving RLE. Ambulation/Gait Ambulation/Gait: No  Posture/Postural Control Posture/Postural Control: No significant limitations Balance Balance Assessed: Yes Static Sitting Balance Static Sitting - Balance Support: Left upper extremity supported;No upper extremity supported;Feet supported Static Sitting - Level of Assistance: 3: Mod assist;5: Stand by assistance Static Sitting - Comment/# of Minutes: Pt sat EOB 12 min with first 2 min mod assist with R lean, after pt assisted with reaching left and achieving midline were able to sit with minguard assist and feet supported. Static Standing Balance Static Standing - Level of Assistance: 1: +2 Total assist;Patient percentage (comment) (pt=30%) Static Standing - Comment/# of Minutes: R lean with R knee blocked and LLE hyperextended Exercise    End of Session PT -  End of  Session Equipment Utilized During Treatment: Gait belt Activity Tolerance: Patient tolerated treatment well Patient left: in chair;with call bell in reach;with family/visitor present Nurse Communication: Mobility status for transfers;Need for lift equipment General Behavior During Session: [facility] for tasks performed Cognition: Impaired Co-tx with OT Toney Sang Beth 12/11/2011, 12:33 PM Toney Sang, PT [phone]

## 2011-12-11 NOTE — Evaluation (Signed)
Occupational Therapy Evaluation Patient Details Name: Darrell [name] MRN: [id number] DOB: Jun 23, 1946 Today's Date: 12/11/2011  Problem List:  Patient Active Problem List  Diagnoses  . DIABETES MELLITUS, TYPE II  . HYPERLIPIDEMIA  . SLEEP APNEA, OBSTRUCTIVE, SEVERE  . HYPERTENSION  . VIRAL URI  . ASTHMA, ACUTE  . AUTONOMIC NEUROPATHY, DIABETIC  . Left carotid bruit  . Carotid artery plaque  . Occlusion and stenosis of carotid artery without mention of cerebral infarction    Past Medical History:  Past Medical History  Diagnosis Date  . Diabetes mellitus   . Sleep apnea     cpap      sleep study > 4 yrs  . Hypertension     dr Tawanna Cooler   Past Surgical History:  Past Surgical History  Procedure Date  . Hernia repair   . Heel spur surgery   . Heer surgery   . Endarterectomy 12/09/2011    Procedure: ENDARTERECTOMY CAROTID;  Surgeon: [name], MD;  Location: [facility] OR;  Service: Vascular;  Laterality: Left;  Left Carotid endarterectomy with dacron patch angioplasty    OT Assessment/Plan/Recommendation OT Assessment Clinical Impression Statement: Pt is 3 days Post-Op left Carotid Endarterectomy. 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 Pt presents with below problem list and will benefit from skilled OT in the acute setting followed by CIR to maximize I with ADL and ADL so as to facilitate safe d/c home with family OT Recommendation/Assessment: Patient will need skilled OT in the acute care venue OT Problem List: Decreased strength;Decreased activity tolerance;Impaired balance (sitting and/or standing);Impaired vision/perception;Decreased coordination;Decreased cognition;Decreased safety awareness;Decreased knowledge of use of DME or AE;Decreased knowledge of precautions;Cardiopulmonary status limiting activity;Impaired sensation;Obesity;Impaired UE functional  use;Pain;Decreased range of motion OT Therapy Diagnosis : Generalized weakness;Cognitive deficits;Disturbance of vision;Acute pain;Hemiplegia dominant side OT Plan OT Frequency: Min 2X/week OT Treatment/Interventions: Self-care/ADL training;Therapeutic exercise;Neuromuscular education;DME and/or AE instruction;Cognitive remediation/compensation;Therapeutic activities;Visual/perceptual remediation/compensation;Patient/family education;Balance training OT Recommendation Recommendations for Other Services: Rehab consult Follow Up Recommendations: Inpatient Rehab Equipment Recommended: Defer to next venue Individuals Consulted Consulted and Agree with Results and Recommendations: Patient;Family member/caregiver Family Member Consulted: wife and dtr OT Goals Acute Rehab OT Goals OT Goal Formulation: With patient/family Time For Goal Achievement: 2 weeks ADL Goals Pt Will Perform Grooming: with min assist;Supported (with minimal VC) ADL Goal: Grooming - Progress: Goal set today Pt Will Perform Upper Body Bathing: with min assist;Sitting, edge of bed ADL Goal: Upper Body Bathing - Progress: Goal set today Pt Will Perform Upper Body Dressing: with min assist;Sitting, bed (with minimal VC's) ADL Goal: Upper Body Dressing - Progress: Goal set today Pt Will Transfer to Toilet: with max assist;Stand pivot transfer;3-in-1 ADL Goal: Toilet Transfer - Progress: Goal set today Additional ADL Goal #1: Pt will locate 5/5 objects in right visual field with Minimal cueing in prep for ADLs ADL Goal: Additional Goal #1 - Progress: Goal set today Additional ADL Goal #2: Pt. will use 2/3 ADL items appropriately with no verbal or tacile cues. ADL Goal: Additional Goal #2 - Progress: Goal set today Arm Goals Pt Will Tolerate PROM: with caregiver independent in performing;to maintain range of motion;Left upper extremity;3 sets;10 reps Arm Goal: PROM - Progress: Goal set today  OT  Evaluation Precautions/Restrictions  Precautions Precautions: Fall Precaution Comments: pt pushes R in standing Restrictions Weight Bearing Restrictions: No Prior Functioning Home Living Lives With: Spouse Receives Help From: Family Type of  Home: House Home Layout: Two level;Bed/bath upstairs Home Access: Stairs to enter Entrance Stairs-Number of Steps: 5 Home Adaptive Equipment: Straight cane Prior Function Level of Independence: Independent with basic ADLs;Independent with homemaking with ambulation;Independent with gait;Independent with transfers Able to Take Stairs?: Reciprically Driving: Yes Vocation: Full time employment ADL ADL Eating/Feeding: Maximal assistance;Simulated Eating/Feeding Details (indicate cue type and reason): required up to Mod A sitting EOB Where Assessed - Eating/Feeding: Edge of bed Grooming: Performed;Wash/dry face;Maximal assistance Grooming Details (indicate cue type and reason): pt initiated face washing after given VC's but required hand-over-hand to wash more than his chin. Also, pt with agnosia. Unable to put glasses on without explicit directions or hand-over-hand  Where Assessed - Grooming: Sitting, bed Upper Body Bathing: Simulated;Maximal assistance Where Assessed - Upper Body Bathing: Sitting, bed Lower Body Bathing: Simulated;+1 Total assistance Where Assessed - Lower Body Bathing: Sitting, bed Upper Body Dressing: Not assessed Lower Body Dressing: Not assessed Toilet Transfer: Simulated;+2 Total assistance (pt=30%) Toilet Transfer Details (indicate cue type and reason): pt with hyperextended LLE (and pushing to right) and buckling of RLE during transfer making it difficult for therapists to assist patient in knee flexion to take a step toward left to sit in chair. If still pushing next session, may be easier for transfer to Rt. despite buckling of right knee Toilet Transfer Method: Stand pivot Tub/Shower Transfer: Not assessed Ambulation  Related to ADLs: unable ADL Comments: Pt with visual agnosia. 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 Difficult to assess whether he recognized them  Vision/Perception  Vision - History Baseline Vision: Wears glasses all the time Patient Visual Report: Diplopia Vision - Assessment Eye Alignment:  (right eye exophoria) Additional Comments: Pt. with right eye exophoria and left gaze perference. Able to bring eyes to and past midline to the right with max verbal and tactile cueing. Cognition Cognition Overall Cognitive Status: Impaired Attention: Impaired Sensation/Coordination Sensation Light Touch: Impaired Detail Light Touch Impaired Details: Impaired RUE (responded to noxious stimuli on anterior surface of arm) Coordination Gross Motor Movements are Fluid and Coordinated: No Fine Motor Movements are Fluid and Coordinated: No Extremity Assessment RUE Strength RUE Overall Strength Comments: Pt. followed no command for RUE (flaccid) Possible tone noted in Rt hand- will continue to assess for appropriateness of splint LUE Assessment LUE Assessment: Within Functional Limits Mobility  Bed Mobility Rolling Right: 1: +2 Total assist;Patient percentage (comment);With rail (pt=10%) Rolling Right Details (indicate cue type and reason): facilitation with verbal and hand over hand cueing to initiate and complete transfer Right Sidelying to Sit: HOB elevated (comment degrees);1: +2 Total assist;Patient percentage (comment) (pt=10% , HOB 20degrees) Right Sidelying to Sit Details (indicate cue type and reason): pt resisting bringing legs off bed and pulled LLE back onto bed 2x before able to leave it down to facilitate trunk and hips into sitting Transfers Sit to Stand: From elevated surface;From bed;Patient percentage (comment);1: +2 Total assist (pt =30%) Sit to Stand Details (indicate cue type and  reason): facilitation for anterior weight shift, RLE blocked, with buckling in full standing and assist to achieve standing. Once standing pt pushes R with max assist to shift weight. Stand to Sit: 1: +2 Total assist;To chair/3-in-1 (pt=30%) Stand to Sit Details: assist to control descent reaching with LUE for chair Sitting balance: Pt went from Mod A sitting balance to Min guard A sitting EOB. Initially pushing to the Rt with LUE End of Session OT -  End of Session Equipment Utilized During Treatment: Gait belt Activity Tolerance: Patient tolerated treatment well (easily agitated secondary to aphasia and agnosias) Patient left: in chair;with call bell in reach;with family/visitor present Nurse Communication: Mobility status for transfers;Need for lift equipment General Behavior During Session: [facility] for tasks performed Cognition: Impaired   Roshad Hack 12/11/2011, 3:35 PM

## 2011-12-12 ENCOUNTER — Inpatient Hospital Stay (HOSPITAL_COMMUNITY): Payer: Medicare Other

## 2011-12-12 DIAGNOSIS — I6350 Cerebral infarction due to unspecified occlusion or stenosis of unspecified cerebral artery: Secondary | ICD-10-CM | POA: Diagnosis not present

## 2011-12-12 LAB — GLUCOSE, CAPILLARY
Glucose-Capillary: 195 mg/dL — ABNORMAL HIGH (ref 70–99)
Glucose-Capillary: 178 mg/dL — ABNORMAL HIGH (ref 70–99)
Glucose-Capillary: 194 mg/dL — ABNORMAL HIGH (ref 70–99)

## 2011-12-12 MED ORDER — STARCH (THICKENING) PO POWD: ORAL | Status: DC | PRN

## 2011-12-12 MED ORDER — FOOD THICKENER (THICKENUP CLEAR)
ORAL | Status: DC | PRN
  Filled 2011-12-12: qty 120

## 2011-12-12 MED ORDER — CITALOPRAM HYDROBROMIDE 10 MG PO TABS
10.0000 mg | ORAL_TABLET | Freq: Every day | ORAL | Status: DC
  Administered 2011-12-12 – 2011-12-14 (×3): 10 mg via ORAL
  Filled 2011-12-12 (×3): qty 1

## 2011-12-12 NOTE — Progress Notes (Signed)
[facility]  Subjective  - POD #3 s/p left carotid  No acute events..   Physical Exam:  Wiggles his toes on the right foot.  Can not lift foot off of bed today.  No movement in right hand or right arm. Verbalizes understanding Incisions clean.  JP with minimal output       Assessment/Plan:  POD #3  D/c JP  MBS today.  If he does not pass, will ned panda Keep in ICU today Check labs in am  Shogo Larkey IV, V. WELLS 12/12/2011 6:11 AM --  Filed Vitals:   12/12/11 0400  BP: 148/90  Pulse: 94  Temp:   Resp: 24    Intake/Output Summary (Last 24 hours) at 12/12/11 0611 Last data filed at 12/12/11 0400  Gross per 24 hour  Intake   2200 ml  Output   1885 ml  Net    315 ml     Laboratory CBC    Component Value Date/Time   WBC 14.7* 12/10/2011 0425   HGB 11.7* 12/10/2011 0425   HCT 34.7* 12/10/2011 0425   PLT 208 12/10/2011 0425    BMET    Component Value Date/Time   NA 134* 12/10/2011 0425   K 3.9 12/10/2011 0425   CL 102 12/10/2011 0425   CO2 21 12/10/2011 0425   GLUCOSE 208* 12/10/2011 0425   GLUCOSE 141* 10/05/2006 0904   BUN 13 12/10/2011 0425   CREATININE 0.78 12/10/2011 0425   CALCIUM 8.6 12/10/2011 0425   GFRNONAA >90 12/10/2011 0425   GFRAA >90 12/10/2011 0425    COAG Lab Results  Component Value Date   INR 1.05 12/09/2011   INR 1.16 12/09/2011   INR 1.07 12/08/2011   No results found for this basename: PTT    Antibiotics Anti-infectives     Start     Dose/Rate Route Frequency Ordered Stop   12/09/11 1730   cefUROXime (ZINACEF) 1.5 g in dextrose 5 % 50 mL IVPB        1.5 g 100 mL/hr over 30 Minutes Intravenous Every 12 hours 12/09/11 1628 12/10/11 0630   12/09/11 0715   cefUROXime (ZINACEF) 1.5 g in dextrose 5 % 50 mL IVPB        1.5 g 100 mL/hr over 30 Minutes Intravenous  Once 12/09/11 0703 12/09/11 0837           V. Charlena Cross, M.D. [facility] [place] Office:  [phone] Pager:  [phone]

## 2011-12-12 NOTE — Progress Notes (Signed)
Speech Language/Pathology  Modified Barium Swallow Procedure Note Patient Details  Name: Darrell [name] MRN: [id number] Date of Birth: 09/22/1946     Past Medical History:  Past Medical History  Diagnosis Date  . Diabetes mellitus   . Sleep apnea     cpap      sleep study > 4 yrs  . Hypertension     dr Tawanna Cooler   Past Surgical History:  Past Surgical History  Procedure Date  . Hernia repair   . Heel spur surgery   . Heer surgery   . Endarterectomy 12/09/2011    Procedure: ENDARTERECTOMY CAROTID;  Surgeon: [name], MD;  Location: [facility] OR;  Service: Vascular;  Laterality: Left;  Left Carotid endarterectomy with dacron patch angioplasty   HPI: KAITO SCHULENBURG is a 66 y.o. male who experienced L CVA after carotid endarterectomy. CVA 12/09/11 specific to infarcts in the left anterior basal ganglia and left cerebral infarcts within a watershed distribution both anteriorly and posteriorly. Pt with mixed aphasia and significant oral apraxia.  Participated in [facility] 2/14, which revealed moderate oropharyngeal dysphagia with poor awareness, weak swallow response, and residuals, prohibiting initiation of diet.  Pt with improvements last two days, and appears to be ready for repeat swallow   Recommendation/Prognosis   Clinical impression: Pt presents with improved swallow function since 2/14 MBS, sufficient to begin a conservative PO diet.  Presents with significant apraxia, impacting bolus control and lingual-palatal valving, and leading to premature spillage of all materials into the hypopharynx prior to swallow trigger.  Despite this, pt protected airway remarkably well, with laryngeal penetration of thin liquids but adequate airway protection with nectars and purees.  Penetration to the cords did not elicit a cough response.  Given degree of apraxia and decreased sensory awareness of mild residuals, recommend initiating a Dysphagia 1 diet with nectar-thick liquids.    Recommendations Solid  Consistency: Dysphagia 1 (Puree) Liquid Consistency: Nectar Liquid Administration via: Cup Medication Administration: Crushed with puree Supervision: Full supervision/cueing for compensatory strategies Compensations: Slow rate;Small sips/bites Postural Changes and/or Swallow Maneuvers: Seated upright 90 degrees  SLP Goals  SLP Swallowing Goals Patient will consume recommended diet without observed clinical signs of aspiration with: Moderate assistance Patient will utilize recommended strategies during swallow to increase swallowing safety with: Moderate assistance     Tala Eber L. Samson Frederic, [place] CCC/SLP Pager [phone]    Blenda Mounts Laurice 12/12/2011, 4:20 PM

## 2011-12-12 NOTE — Progress Notes (Signed)
Stroke Team Progress Note  HISTORY Darrell [name] is an 66 y.o. male patient presented to [facility] for L CEA. Post operatively patient was noted to become unresponsive and not moving right side. Patient was brought back to the ED to further evaluation there was thrombus throughout the entire endarterectomy site. This was removed and patient was brought back to PACU. Neurology was ask to evaluate due to not moving right side, not able to follow commands and question of stroke. He was last seen normal at 1212. Not a tPA candidate secondary to recent carotid surgery.  SUBJECTIVE  He appears slightly  improved from yesterday and is now able to speak short sentences and move his right leg off the bed but remains paralyzed in the right upper extremity  OBJECTIVE Filed Vitals:   12/12/11 0700 12/12/11 0800 12/12/11 0807 12/12/11 0900  BP: 146/97 149/81  156/130  Pulse: 89 84  92  Temp:   98.5 F (36.9 C)   TempSrc:   Oral   Resp: 24 21  23   Height:      Weight:      SpO2: 96% 95%  94%   CBG (last 3)   Basename 12/12/11 0738 12/12/11 0430 12/12/11 0022  GLUCAP 178* 195* 184*   Intake/Output from previous day: 02/15 0701 - 02/16 0700 In: 2400 [I.V.:2400] Out: 2005 [Urine:2005]  IV Fluid Intake:      . sodium chloride 100 mL/hr at 12/11/11 1750  . DOPamine     Medications     . antiseptic oral rinse  15 mL Mouth Rinse q12n4p  . aspirin EC  325 mg Oral Daily   Or  . aspirin  300 mg Rectal Daily  . chlorhexidine  15 mL Mouth Rinse BID  . insulin aspart  0-15 Units Subcutaneous Q4H  PRN hydrALAZINE, HYDROmorphone (DILAUDID) injection, labetalol, metoprolol, ondansetron, phenol  Diet:  NPO Activity: not designated in orders DVT Prophylaxis:  SCDs   Significant Diagnostic Studies: CBC    Component Value Date/Time   WBC 14.7* 12/10/2011 0425   RBC 4.02* 12/10/2011 0425   HGB 11.7* 12/10/2011 0425   HCT 34.7* 12/10/2011 0425   PLT 208 12/10/2011 0425   MCV 86.3 12/10/2011  0425   MCH 29.1 12/10/2011 0425   MCHC 33.7 12/10/2011 0425   RDW 13.4 12/10/2011 0425   LYMPHSABS 0.5* 12/09/2011 1927   MONOABS 0.4 12/09/2011 1927   EOSABS 0.0 12/09/2011 1927   BASOSABS 0.0 12/09/2011 1927   CMP    Component Value Date/Time   NA 134* 12/10/2011 0425   K 3.9 12/10/2011 0425   CL 102 12/10/2011 0425   CO2 21 12/10/2011 0425   GLUCOSE 208* 12/10/2011 0425   GLUCOSE 141* 10/05/2006 0904   BUN 13 12/10/2011 0425   CREATININE 0.78 12/10/2011 0425   CALCIUM 8.6 12/10/2011 0425   PROT 7.2 12/08/2011 1209   ALBUMIN 4.0 12/08/2011 1209   AST 29 12/08/2011 1209   ALT 29 12/08/2011 1209   ALKPHOS 39 12/08/2011 1209   BILITOT 0.4 12/08/2011 1209   GFRNONAA >90 12/10/2011 0425   GFRAA >90 12/10/2011 0425   COAGS Lab Results  Component Value Date   INR 1.05 12/09/2011   INR 1.16 12/09/2011   INR 1.07 12/08/2011   Lipid Panel    Component Value Date/Time   CHOL 116 12/10/2011 0425   TRIG 228* 12/10/2011 0425   HDL 19* 12/10/2011 0425   CHOLHDL 6.1 12/10/2011 0425   VLDL 46* 12/10/2011 0425  LDLCALC 51 12/10/2011 0425   gbA1C  Lab Results  Component Value Date   HGBA1C 7.5* 12/09/2011   Urine Drug Screen  No results found for this basename: labopia,  cocainscrnur,  labbenz,  amphetmu,  thcu,  labbarb    Alcohol Level No results found for this basename: eth   CT of the brain   No acute or reversible findings.  Age related atrophy.  Mild chronic small vessel changes as outlined above.  Atherosclerosis.   MRI of the brain    1.  Non hemorrhagic left cerebral infarcts within a watershed distribution both anteriorly and posteriorly. 2.  Additional focal non hemorrhagic infarct within the anterior left basal ganglia. 3.  Remote lacunar infarcts of the lentiform nucleus bilaterally. The number for mild periventricular and subcortical white matter changes are advanced for age.  This likely reflecting sequelae of chronic microvascular ischemia.   MRA of the brain  1.  Minimal distal small  vessel disease. 2.  No significant proximal stenosis, aneurysm, or branch vessel occlusion.   2D Echocardiogram  ordered   Carotid Doppler  Left 40-59%, right 0-39%  CXR  Enlargement of cardiac silhouette. Chronic bronchitic changes.  EKG  normal sinus rhythm, left axis deviation.   Physical Exam  Pleasant middle-aged Caucasian male sitting up in bed not in distress. Carotid endarterectomy bandage noted in the left neck and upper chest. Head is nontraumatic. Neck is supple. Cardiac exam no murmur or gallop. Lungs are clear to auscultation.  Neurological exam awake alert.mild global aphasia follows   midline commands. Nonfluent speech speaks a few   short sentences only. Unable to name but now able to repeat. Extraocular movements appear full range. Blinks to threat more on the left than the right side. Right lower facial weakness. Tongue is midline. Dense flaccid right upper extremity monoplegia with 0/5 strength.the right lower extremity 2-3/5 strength Diminished sensation on the right hemibody. Right plantar is upgoing left is downgoing. Coordination and gait cannot be tested.  ASSESSMENT Darrell [name] is a 66 y.o. male with a left brain infarct, secondary to left ICA stenosis s/p L CEA 12/09/2011 with rethrombosis and surgical site. Hematologist consulted; hypercoagulable workup underway. On aspirin 325 mg orally every day for secondary stroke prevention. Inpatient rehab has been consulted.  - hypertension - diabetes, elevated HbgA1c - OSA - hypertriglyceridemia - dysphagia.  Hospital day # 3  TREATMENT/PLAN - ST swallow evaluation with MBSS. Diet per ST. - OOB - therapy evaluations - follow up hypercoagulable labs No family at bedside today    Dr. Delia Heady

## 2011-12-13 DIAGNOSIS — I6350 Cerebral infarction due to unspecified occlusion or stenosis of unspecified cerebral artery: Secondary | ICD-10-CM | POA: Diagnosis not present

## 2011-12-13 LAB — BASIC METABOLIC PANEL
BUN: 14 mg/dL (ref 6–23)
CO2: 25 mEq/L (ref 19–32)
Calcium: 8.5 mg/dL (ref 8.4–10.5)
Creatinine, Ser: 0.73 mg/dL (ref 0.50–1.35)

## 2011-12-13 LAB — GLUCOSE, CAPILLARY
Glucose-Capillary: 195 mg/dL — ABNORMAL HIGH (ref 70–99)
Glucose-Capillary: 199 mg/dL — ABNORMAL HIGH (ref 70–99)
Glucose-Capillary: 204 mg/dL — ABNORMAL HIGH (ref 70–99)
Glucose-Capillary: 209 mg/dL — ABNORMAL HIGH (ref 70–99)

## 2011-12-13 LAB — CBC
MCH: 28.9 pg (ref 26.0–34.0)
MCV: 88.2 fL (ref 78.0–100.0)
Platelets: 236 10*3/uL (ref 150–400)
RDW: 13.4 % (ref 11.5–15.5)

## 2011-12-13 MED ORDER — ASPIRIN 325 MG PO TABS
325.0000 mg | ORAL_TABLET | Freq: Every day | ORAL | Status: DC
  Administered 2011-12-13 – 2011-12-14 (×2): 325 mg via ORAL
  Filled 2011-12-13 (×2): qty 1

## 2011-12-13 NOTE — Progress Notes (Signed)
[facility] [place]  Subjective  - POD #4  Passed swallow study yesterday. Talking in short sentences No pain   Physical Exam:  Able to lift right leg off of bed. Verbal skills improved.  Speaking in sentences Still no function of right hand       Assessment/Plan:  POD #4  Continues to make progress Transfer to stepdown Will keep foley in place one more day due to mobility limitations PT/OT /Rehab Decrease IV fluids  Rayssa Atha IV, V. WELLS 12/13/2011 7:46 AM --  Filed Vitals:   12/13/11 0700  BP: 137/92  Pulse: 85  Temp:   Resp: 18    Intake/Output Summary (Last 24 hours) at 12/13/11 0746 Last data filed at 12/13/11 0700  Gross per 24 hour  Intake   2400 ml  Output   1886 ml  Net    514 ml     Laboratory CBC    Component Value Date/Time   WBC 12.4* 12/13/2011 0401   HGB 12.5* 12/13/2011 0401   HCT 38.1* 12/13/2011 0401   PLT 236 12/13/2011 0401    BMET    Component Value Date/Time   NA 133* 12/13/2011 0401   K 3.9 12/13/2011 0401   CL 99 12/13/2011 0401   CO2 25 12/13/2011 0401   GLUCOSE 184* 12/13/2011 0401   GLUCOSE 141* 10/05/2006 0904   BUN 14 12/13/2011 0401   CREATININE 0.73 12/13/2011 0401   CALCIUM 8.5 12/13/2011 0401   GFRNONAA >90 12/13/2011 0401   GFRAA >90 12/13/2011 0401    COAG Lab Results  Component Value Date   INR 1.05 12/09/2011   INR 1.16 12/09/2011   INR 1.07 12/08/2011   No results found for this basename: PTT    Antibiotics Anti-infectives     Start     Dose/Rate Route Frequency Ordered Stop   12/09/11 1730   cefUROXime (ZINACEF) 1.5 g in dextrose 5 % 50 mL IVPB        1.5 g 100 mL/hr over 30 Minutes Intravenous Every 12 hours 12/09/11 1628 12/10/11 0630   12/09/11 0715   cefUROXime (ZINACEF) 1.5 g in dextrose 5 % 50 mL IVPB        1.5 g 100 mL/hr over 30 Minutes Intravenous  Once 12/09/11 0703 12/09/11 0837           V. Charlena Cross, M.D. [facility]  [place] Office: [phone] Pager:  [phone]

## 2011-12-13 NOTE — Progress Notes (Signed)
Stroke Team Progress Note  HISTORY Darrell [name] is an 66 y.o. male patient presented to [facility] for L CEA. Post operatively patient was noted to become unresponsive and not moving right side. Patient was brought back to the ED to further evaluation there was thrombus throughout the entire endarterectomy site. This was removed and patient was brought back to PACU. Neurology was ask to evaluate due to not moving right side, not able to follow commands and question of stroke. He was last seen normal at 1212. Not a tPA candidate secondary to recent carotid surgery.  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 OBJECTIVE Filed Vitals:   12/13/11 0700 12/13/11 0800 12/13/11 0803 12/13/11 0900  BP: 137/92 133/86  152/104  Pulse: 85 88  93  Temp:   98.5 F (36.9 C)   TempSrc:   Oral   Resp: 18 21  21   Height:      Weight:      SpO2: 94% 93%  94%   CBG (last 3)   Basename 12/13/11 0801 12/13/11 0418 12/12/11 2350  GLUCAP 177* 172* 176*   Intake/Output from previous day: 02/16 0701 - 02/17 0700 In: 2400 [I.V.:2400] Out: 1886 [Urine:1885; Stool:1]  IV Fluid Intake:      . sodium chloride 100 mL/hr at 12/11/11 1750  . DOPamine     Medications     . antiseptic oral rinse  15 mL Mouth Rinse q12n4p  . aspirin  300 mg Rectal Daily  . aspirin  325 mg Oral Daily  . chlorhexidine  15 mL Mouth Rinse BID  . citalopram  10 mg Oral Daily  . insulin aspart  0-15 Units Subcutaneous Q4H  . DISCONTD: aspirin EC  325 mg Oral Daily  PRN food thickener, hydrALAZINE, HYDROmorphone (DILAUDID) injection, labetalol, metoprolol, ondansetron, phenol, DISCONTD: food thickener  Diet:  NPO Activity: not designated in orders DVT Prophylaxis:  SCDs   Significant Diagnostic Studies: CBC    Component Value Date/Time   WBC 12.4* 12/13/2011 0401   RBC 4.32 12/13/2011 0401   HGB 12.5* 12/13/2011 0401   HCT 38.1* 12/13/2011 0401   PLT 236 12/13/2011 0401   MCV 88.2 12/13/2011 0401   MCH 28.9 12/13/2011 0401   MCHC 32.8 12/13/2011 0401   RDW 13.4 12/13/2011 0401   LYMPHSABS 0.5* 12/09/2011 1927   MONOABS 0.4 12/09/2011 1927   EOSABS 0.0 12/09/2011 1927   BASOSABS 0.0 12/09/2011 1927   CMP    Component Value Date/Time   NA 133* 12/13/2011 0401   K 3.9 12/13/2011 0401   CL 99 12/13/2011 0401   CO2 25 12/13/2011 0401   GLUCOSE 184* 12/13/2011 0401   GLUCOSE 141* 10/05/2006 0904   BUN 14 12/13/2011 0401   CREATININE 0.73 12/13/2011 0401   CALCIUM 8.5 12/13/2011 0401   PROT 7.2 12/08/2011 1209   ALBUMIN 4.0 12/08/2011 1209   AST 29 12/08/2011 1209   ALT 29 12/08/2011 1209   ALKPHOS 39 12/08/2011 1209   BILITOT 0.4 12/08/2011 1209   GFRNONAA >90 12/13/2011 0401   GFRAA >90 12/13/2011 0401   COAGS Lab Results  Component Value Date   INR 1.05 12/09/2011   INR 1.16 12/09/2011   INR 1.07 12/08/2011   Lipid Panel    Component Value Date/Time   CHOL 116 12/10/2011 0425  TRIG 228* 12/10/2011 0425   HDL 19* 12/10/2011 0425   CHOLHDL 6.1 12/10/2011 0425   VLDL 46* 12/10/2011 0425   LDLCALC 51 12/10/2011 0425   gbA1C  Lab Results  Component Value Date   HGBA1C 7.5* 12/09/2011   Urine Drug Screen  No results found for this basename: labopia,  cocainscrnur,  labbenz,  amphetmu,  thcu,  labbarb    Alcohol Level No results found for this basename: eth   CT of the brain   No acute or reversible findings.  Age related atrophy.  Mild chronic small vessel changes as outlined above.  Atherosclerosis.   MRI of the brain    1.  Non hemorrhagic left cerebral infarcts within a watershed distribution both anteriorly and posteriorly. 2.  Additional focal non hemorrhagic infarct within the anterior left basal ganglia. 3.  Remote lacunar infarcts of the lentiform nucleus bilaterally. The number for mild periventricular and subcortical white  matter changes are advanced for age.  This likely reflecting sequelae of chronic microvascular ischemia.   MRA of the brain  1.  Minimal distal small vessel disease. 2.  No significant proximal stenosis, aneurysm, or branch vessel occlusion.   2D Echocardiogram  ordered   Carotid Doppler  Left 40-59%, right 0-39%  CXR  Enlargement of cardiac silhouette. Chronic bronchitic changes.  EKG  normal sinus rhythm, left axis deviation.   Physical Exam  Pleasant middle-aged Caucasian male sitting up in bed not in distress. Carotid endarterectomy bandage noted in the left neck and upper chest. Head is nontraumatic. Neck is supple. Cardiac exam no murmur or gallop. Lungs are clear to auscultation.  Neurological exam awake alert.mild global aphasia follows   midline commands. Nonfluent speech speaks a few   short sentences only. Unable to name but now able to repeat. Extraocular movements appear full range. Blinks to threat more on the left than the right side. Right lower facial weakness. Tongue is midline. Dense flaccid right upper extremity monoplegia with 0/5 strength.the right lower extremity  3/5 strength Diminished sensation on the right hemibody. Right plantar is upgoing left is downgoing. Coordination and gait cannot be tested.  ASSESSMENT [name] is a 66 y.o. male with a left brain infarct, secondary to left ICA stenosis s/p L CEA 12/09/2011 with rethrombosis and surgical site. Hematologist consulted; hypercoagulable workup underway. On aspirin 325 mg orally every day for secondary stroke prevention. Inpatient rehab has been consulted.  - hypertension - diabetes, elevated HbgA1c - OSA - hypertriglyceridemia - dysphagia.  Hospital day # 4  TREATMENT/PLAN  - OOB - therapy evaluations continue. Likely need inpatient rehab - follow up hypercoagulable labs D/W family at bedside today.Family interested in ACCELERATE stroke trial participation Sign off Follow up as outpatient 2  months    Dr. Delia Heady

## 2011-12-14 ENCOUNTER — Encounter: Payer: Medicare Other | Admitting: Cardiology

## 2011-12-14 ENCOUNTER — Inpatient Hospital Stay (HOSPITAL_COMMUNITY)
Admission: RE | Admit: 2011-12-14 | Discharge: 2011-12-24 | DRG: 945 | Disposition: A | Discharge status: Skilled Nursing Facility | Payer: Medicare Other | Source: Ambulatory Visit | Attending: Physical Medicine & Rehabilitation | Admitting: Physical Medicine & Rehabilitation

## 2011-12-14 DIAGNOSIS — G4733 Obstructive sleep apnea (adult) (pediatric): Secondary | ICD-10-CM | POA: Diagnosis not present

## 2011-12-14 DIAGNOSIS — G4700 Insomnia, unspecified: Secondary | ICD-10-CM | POA: Diagnosis present

## 2011-12-14 DIAGNOSIS — Z5189 Encounter for other specified aftercare: Secondary | ICD-10-CM | POA: Diagnosis not present

## 2011-12-14 DIAGNOSIS — E1149 Type 2 diabetes mellitus with other diabetic neurological complication: Secondary | ICD-10-CM | POA: Diagnosis present

## 2011-12-14 DIAGNOSIS — I10 Essential (primary) hypertension: Secondary | ICD-10-CM | POA: Diagnosis not present

## 2011-12-14 DIAGNOSIS — D72829 Elevated white blood cell count, unspecified: Secondary | ICD-10-CM | POA: Diagnosis present

## 2011-12-14 DIAGNOSIS — G909 Disorder of the autonomic nervous system, unspecified: Secondary | ICD-10-CM | POA: Diagnosis not present

## 2011-12-14 DIAGNOSIS — I6350 Cerebral infarction due to unspecified occlusion or stenosis of unspecified cerebral artery: Secondary | ICD-10-CM | POA: Diagnosis not present

## 2011-12-14 DIAGNOSIS — E119 Type 2 diabetes mellitus without complications: Secondary | ICD-10-CM | POA: Diagnosis not present

## 2011-12-14 DIAGNOSIS — G8191 Hemiplegia, unspecified affecting right dominant side: Secondary | ICD-10-CM | POA: Diagnosis present

## 2011-12-14 DIAGNOSIS — G4730 Sleep apnea, unspecified: Secondary | ICD-10-CM | POA: Diagnosis not present

## 2011-12-14 DIAGNOSIS — E139 Other specified diabetes mellitus without complications: Secondary | ICD-10-CM | POA: Insufficient documentation

## 2011-12-14 DIAGNOSIS — R894 Abnormal immunological findings in specimens from other organs, systems and tissues: Secondary | ICD-10-CM | POA: Diagnosis not present

## 2011-12-14 DIAGNOSIS — I519 Heart disease, unspecified: Secondary | ICD-10-CM | POA: Diagnosis not present

## 2011-12-14 DIAGNOSIS — I639 Cerebral infarction, unspecified: Secondary | ICD-10-CM | POA: Diagnosis present

## 2011-12-14 DIAGNOSIS — G459 Transient cerebral ischemic attack, unspecified: Secondary | ICD-10-CM | POA: Diagnosis not present

## 2011-12-14 DIAGNOSIS — Z794 Long term (current) use of insulin: Secondary | ICD-10-CM | POA: Diagnosis not present

## 2011-12-14 DIAGNOSIS — K5900 Constipation, unspecified: Secondary | ICD-10-CM | POA: Diagnosis not present

## 2011-12-14 DIAGNOSIS — I6330 Cerebral infarction due to thrombosis of unspecified cerebral artery: Secondary | ICD-10-CM | POA: Diagnosis not present

## 2011-12-14 DIAGNOSIS — F329 Major depressive disorder, single episode, unspecified: Secondary | ICD-10-CM | POA: Diagnosis not present

## 2011-12-14 DIAGNOSIS — G8110 Spastic hemiplegia affecting unspecified side: Secondary | ICD-10-CM | POA: Diagnosis not present

## 2011-12-14 DIAGNOSIS — J45909 Unspecified asthma, uncomplicated: Secondary | ICD-10-CM | POA: Diagnosis not present

## 2011-12-14 DIAGNOSIS — R1312 Dysphagia, oropharyngeal phase: Secondary | ICD-10-CM | POA: Diagnosis present

## 2011-12-14 DIAGNOSIS — I119 Hypertensive heart disease without heart failure: Secondary | ICD-10-CM | POA: Diagnosis not present

## 2011-12-14 LAB — GLUCOSE, CAPILLARY
Glucose-Capillary: 195 mg/dL — ABNORMAL HIGH (ref 70–99)
Glucose-Capillary: 246 mg/dL — ABNORMAL HIGH (ref 70–99)

## 2011-12-14 LAB — CBC
Hemoglobin: 11.9 g/dL — ABNORMAL LOW (ref 13.0–17.0)
Platelets: 248 10*3/uL (ref 150–400)
RBC: 4.09 MIL/uL — ABNORMAL LOW (ref 4.22–5.81)
WBC: 11.4 10*3/uL — ABNORMAL HIGH (ref 4.0–10.5)

## 2011-12-14 LAB — BASIC METABOLIC PANEL
CO2: 26 mEq/L (ref 19–32)
Glucose, Bld: 182 mg/dL — ABNORMAL HIGH (ref 70–99)
Potassium: 3.9 mEq/L (ref 3.5–5.1)
Sodium: 135 mEq/L (ref 135–145)

## 2011-12-14 MED ORDER — ACETAMINOPHEN 325 MG PO TABS
325.0000 mg | ORAL_TABLET | ORAL | Status: DC | PRN
  Administered 2011-12-21 – 2011-12-22 (×3): 650 mg via ORAL
  Filled 2011-12-14 (×3): qty 2

## 2011-12-14 MED ORDER — DIPHENHYDRAMINE HCL 12.5 MG/5ML PO ELIX: 12.5000 mg | ORAL_SOLUTION | Freq: Four times a day (QID) | ORAL | Status: DC | PRN

## 2011-12-14 MED ORDER — PROMETHAZINE HCL 25 MG/ML IJ SOLN
12.5000 mg | Freq: Four times a day (QID) | INTRAMUSCULAR | Status: DC | PRN
  Administered 2011-12-16: 12.5 mg via INTRAMUSCULAR
  Filled 2011-12-14: qty 1

## 2011-12-14 MED ORDER — INSULIN ASPART 100 UNIT/ML ~~LOC~~ SOLN
0.0000 [IU] | Freq: Three times a day (TID) | SUBCUTANEOUS | Status: DC
  Administered 2011-12-15: 8 [IU] via SUBCUTANEOUS
  Administered 2011-12-15 – 2011-12-16 (×3): 3 [IU] via SUBCUTANEOUS
  Administered 2011-12-16: 5 [IU] via SUBCUTANEOUS
  Administered 2011-12-16 – 2011-12-17 (×2): 3 [IU] via SUBCUTANEOUS
  Administered 2011-12-17: 5 [IU] via SUBCUTANEOUS
  Administered 2011-12-18: 8 [IU] via SUBCUTANEOUS
  Administered 2011-12-18 – 2011-12-19 (×4): 3 [IU] via SUBCUTANEOUS
  Administered 2011-12-19: 5 [IU] via SUBCUTANEOUS
  Administered 2011-12-20: 3 [IU] via SUBCUTANEOUS
  Administered 2011-12-20 (×2): 8 [IU] via SUBCUTANEOUS
  Administered 2011-12-21: 3 [IU] via SUBCUTANEOUS
  Administered 2011-12-21: 5 [IU] via SUBCUTANEOUS
  Administered 2011-12-21: 3 [IU] via SUBCUTANEOUS
  Administered 2011-12-22: 5 [IU] via SUBCUTANEOUS
  Administered 2011-12-22: 8 [IU] via SUBCUTANEOUS
  Administered 2011-12-23 (×3): 3 [IU] via SUBCUTANEOUS
  Administered 2011-12-24: 2 [IU] via SUBCUTANEOUS
  Administered 2011-12-24: 3 [IU] via SUBCUTANEOUS
  Filled 2011-12-14: qty 3

## 2011-12-14 MED ORDER — CITALOPRAM HYDROBROMIDE 10 MG PO TABS
10.0000 mg | ORAL_TABLET | Freq: Every day | ORAL | Status: DC
  Administered 2011-12-15 – 2011-12-24 (×10): 10 mg via ORAL
  Filled 2011-12-14 (×11): qty 1

## 2011-12-14 MED ORDER — BISACODYL 10 MG RE SUPP
10.0000 mg | Freq: Every day | RECTAL | Status: DC | PRN
  Administered 2011-12-17 – 2011-12-22 (×2): 10 mg via RECTAL
  Filled 2011-12-14 (×3): qty 1

## 2011-12-14 MED ORDER — FLEET ENEMA 7-19 GM/118ML RE ENEM
1.0000 | ENEMA | Freq: Once | RECTAL | Status: AC | PRN
  Filled 2011-12-14: qty 1

## 2011-12-14 MED ORDER — FOOD THICKENER (THICKENUP CLEAR)
ORAL | Status: DC | PRN
  Filled 2011-12-14: qty 120

## 2011-12-14 MED ORDER — CHLORHEXIDINE GLUCONATE 0.12 % MT SOLN
15.0000 mL | Freq: Two times a day (BID) | OROMUCOSAL | Status: DC
  Administered 2011-12-14 – 2011-12-24 (×16): 15 mL via OROMUCOSAL
  Filled 2011-12-14 (×22): qty 15

## 2011-12-14 MED ORDER — HYDROCODONE-ACETAMINOPHEN 10-325 MG PO TABS
1.0000 | ORAL_TABLET | Freq: Four times a day (QID) | ORAL | Status: DC | PRN
  Administered 2011-12-14 – 2011-12-19 (×4): 1 via ORAL
  Administered 2011-12-19: 2 via ORAL
  Administered 2011-12-20 (×2): 1 via ORAL
  Filled 2011-12-14 (×10): qty 1

## 2011-12-14 MED ORDER — POLYETHYLENE GLYCOL 3350 17 G PO PACK
17.0000 g | PACK | Freq: Every day | ORAL | Status: DC | PRN
  Administered 2011-12-17: 17 g via ORAL
  Filled 2011-12-14 (×2): qty 1

## 2011-12-14 MED ORDER — INSULIN ASPART 100 UNIT/ML ~~LOC~~ SOLN
0.0000 [IU] | Freq: Every day | SUBCUTANEOUS | Status: DC
  Administered 2011-12-19: 3 [IU] via SUBCUTANEOUS

## 2011-12-14 MED ORDER — CLONIDINE HCL 0.1 MG PO TABS
0.1000 mg | ORAL_TABLET | ORAL | Status: DC | PRN
  Filled 2011-12-14: qty 1

## 2011-12-14 MED ORDER — SODIUM CHLORIDE 0.9 % IV SOLN
INTRAVENOUS | Status: DC
  Administered 2011-12-14 – 2011-12-22 (×9): via INTRAVENOUS

## 2011-12-14 MED ORDER — TRAZODONE HCL 50 MG PO TABS: 25.0000 mg | ORAL_TABLET | Freq: Every evening | ORAL | Status: DC | PRN

## 2011-12-14 MED ORDER — METFORMIN HCL 850 MG PO TABS
850.0000 mg | ORAL_TABLET | Freq: Two times a day (BID) | ORAL | Status: DC
  Administered 2011-12-14 – 2011-12-16 (×5): 850 mg via ORAL
  Filled 2011-12-14 (×8): qty 1

## 2011-12-14 MED ORDER — PROMETHAZINE HCL 12.5 MG RE SUPP: 12.5000 mg | Freq: Four times a day (QID) | RECTAL | Status: DC | PRN

## 2011-12-14 MED ORDER — LISINOPRIL 5 MG PO TABS
5.0000 mg | ORAL_TABLET | Freq: Every day | ORAL | Status: DC
  Administered 2011-12-14 – 2011-12-23 (×10): 5 mg via ORAL
  Filled 2011-12-14 (×12): qty 1

## 2011-12-14 MED ORDER — GUAIFENESIN-DM 100-10 MG/5ML PO SYRP: 5.0000 mL | ORAL_SOLUTION | Freq: Four times a day (QID) | ORAL | Status: DC | PRN

## 2011-12-14 MED ORDER — ALUM & MAG HYDROXIDE-SIMETH 200-200-20 MG/5ML PO SUSP: 30.0000 mL | ORAL | Status: DC | PRN

## 2011-12-14 MED ORDER — PROMETHAZINE HCL 12.5 MG PO TABS
12.5000 mg | ORAL_TABLET | Freq: Four times a day (QID) | ORAL | Status: DC | PRN
  Administered 2011-12-20 (×2): 12.5 mg via ORAL
  Filled 2011-12-14 (×2): qty 1

## 2011-12-14 MED ORDER — ASPIRIN 325 MG PO TABS
325.0000 mg | ORAL_TABLET | Freq: Every day | ORAL | Status: DC
  Administered 2011-12-15 – 2011-12-24 (×10): 325 mg via ORAL
  Filled 2011-12-14 (×11): qty 1

## 2011-12-14 MED ORDER — BIOTENE DRY MOUTH MT LIQD
15.0000 mL | Freq: Two times a day (BID) | OROMUCOSAL | Status: DC
  Administered 2011-12-15 – 2011-12-24 (×17): 15 mL via OROMUCOSAL

## 2011-12-14 NOTE — Discharge Summary (Signed)
[facility] Discharge Summary   Patient ID:  AYAZ SONDGEROTH MRN: [id number] DOB/AGE: [birth year] 67 y.o.  Admit date: 12/09/2011 Discharge date: 12/14/2011 Date of Surgery: 12/09/2011 Surgeon: Surgeon(s): [name], MD  Admission Diagnosis: Severe Left carotid stenosis Bring Back For thrombosed right ICA Post-operative stroke Discharge Diagnoses:  Severe Left carotid stenosis Bring Back For thrombosed right ICA Post-operative stroke  Secondary Diagnoses: Past Medical History  Diagnosis Date  . Diabetes mellitus   . Sleep apnea     cpap      sleep study > 4 yrs  . Hypertension     dr Tawanna Cooler    Procedure(s): Left Carotid Endarterectomy Take back for thrombectomy and interposition graft with GSV Left ICA Ligation left ECA  Discharged Condition: good with improving neuro status  HPI:  NITESH PITSTICK is a 66 y.o. male patient developed acute onset of blurred vision in the right eye with some dizziness about 10 days ago. He had donated blood which is usual routine every 3 months. That was done on January 31. He denies any history of previous stroke or lateralizing weakness, aphasia, amaurosis fugax, diplopia, blurred vision, or syncope. Following this event he was seen by Dr. Dione Booze and Dr. Luciana Axe who discovered that he had severe circulation problems he had no evidence of stroke according to their evaluation. Carotid duplex exam was ordered and revealed a 90-95% left common carotid artery stenosis and mild internal carotid flow reduction on the contralateral right side. I have reviewed that study and agree with those findings. He does not describe loss of entire visual field or upper quadrant but blurred vision throughout on the right side with no effect of his left visual field. He was admitted for left carotid endarterectomy   Hospital Course:  HYDE SIRES is a 66 y.o. male is S/PLeft Carotid Endarterectomy Take back for thrombectomy and  interposition graft with GSV Left ICA Ligation left ECA Extubated: POD # 0 Post-op wounds healing well Pt pain controlled with PO pain meds. Labs as below Complications:Pt. Had neurologic changes post-op/ stroke like symptoms and was taken emergently back to the OR. Exploration of the left carotid artery showed occlusive clot. The left CCA and ICA was resected and GSV interposition graft from LLE was performed. Pt had expressive aphasia , and weakness of RUE and RLE. aphasia and RLE strength were much improved by time of DC to Rehab. Swallow eval. Noted pt. Had good airway protection and pt. Was started on a DYS 1 diet.  Consults:  Treatment Team:  Reece Packer, MD  Significant Diagnostic Studies: CBC Lab Results  Component Value Date   WBC 11.4* 12/14/2011   HGB 11.9* 12/14/2011   HCT 35.7* 12/14/2011   MCV 87.3 12/14/2011   PLT 248 12/14/2011    BMET    Component Value Date/Time   NA 135 12/14/2011 0550   K 3.9 12/14/2011 0550   CL 102 12/14/2011 0550   CO2 26 12/14/2011 0550   GLUCOSE 182* 12/14/2011 0550   GLUCOSE 141* 10/05/2006 0904   BUN 17 12/14/2011 0550   CREATININE 0.64 12/14/2011 0550   CALCIUM 8.5 12/14/2011 0550   GFRNONAA >90 12/14/2011 0550   GFRAA >90 12/14/2011 0550   COAG Lab Results  Component Value Date   INR 1.05 12/09/2011   INR 1.16 12/09/2011   INR 1.07 12/08/2011     Disposition:  Discharge to :Rehab Discharge Orders    Future Appointments: Provider: Department: Dept Phone: Center:  12/21/2011 9:15 AM Evette Georges, MD [facility] [phone] [facility]   12/22/2011 8:30 AM [name], MD [facility] [phone] [facility]     Future Orders Please Complete By Expires   Resume previous diet      Driving Restrictions      Comments:   No driving for 2 weeks   Lifting restrictions      Comments:   No lifting for 4 weeks   Call MD for:  temperature >100.5      Call MD for:  redness, tenderness, or signs of infection (pain, swelling,  bleeding, redness, odor or green/yellow discharge around incision site)      Call MD for:  severe or increased pain, loss or decreased feeling  in affected limb(s)      Increase activity slowly      Comments:   Walk with assistance use walker or cane as needed   May shower       Scheduling Instructions:   Thursday   CAROTID Sugery: Call MD for difficulty swallowing or speaking; weakness in arms or legs that is a new symtom; severe headache.  If you have increased swelling in the neck and/or  are having difficulty breathing, CALL 911         [place], [place]  Home Medication Instructions UVO:[id number]   Printed on:12/14/11 1421  Medication Information                    aspirin 81 MG EC tablet Take 81 mg by mouth daily.             glipiZIDE (GLUCOTROL XL) 10 MG 24 hr tablet Take 10 mg by mouth 2 (two) times daily.           amitriptyline (ELAVIL) 10 MG tablet Take 10 mg by mouth at bedtime.           fenofibrate micronized (LOFIBRA) 200 MG capsule Take 200 mg by mouth daily before breakfast.           lisinopril (PRINIVIL,ZESTRIL) 20 MG tablet Take 20 mg by mouth daily.           metFORMIN (GLUCOPHAGE) 1000 MG tablet Take 1,000 mg by mouth 2 (two) times daily with a meal.            traMADol (ULTRAM) 50 MG tablet Take 1 tablet (50 mg total) by mouth every 6 (six) hours as needed for pain.            Verbal and written Discharge instructions given to the patient. Wound care per Discharge AVS Follow-up Information    Follow up with [name], MD in 2 weeks. (office will arrange - sent)    Contact information:   [address] [place] [address] [phone]       Follow up with Gates Rigg, MD. Schedule an appointment as soon as possible for a visit in 2 months.   Contact information:   [address], [address] [facility] [place] [address] [phone]          Signed: [name] 12/14/2011,  2:21 PM

## 2011-12-14 NOTE — H&P (Signed)
Physical Medicine and Rehabilitation Admission H&P  Chief Complaint: Right sided weakness and speech difficulties  :  HPI: Darrell [name] is an 66 y.o. 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 Hart Rochester. In recovery, patient with decreased LOC and inability to move right side. Patient taken back to OR emergently for exploration of L-CEA with thrombectomy of meniscectomized surface and distal ICA. Neurology and Hem/Onc consulted for input. 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 Hypercoagulation workup in progress noted to have positive lupus anticoagulant but not reliable due to proximity of treatment with heparin. Patient on ASA per neurology recommendations. PT/ST evaluations done yesterday. MBS done On 02/15 and diet initiated this weekend- DI, nectar liquids. Expressive communication improving. Patient able to complete one step commands with moderate verbal cues. Continues with left gaze preference with right hemiparesis and sensory deficits right side.  Review of Systems  Eyes: Positive for blurred vision (right visual deficits- seeing black spot centrally.).   Past Medical History   Diagnosis  Date   .  Diabetes mellitus    .  Sleep apnea      cpap sleep study > 4 yrs   .  Hypertension      dr Tawanna Cooler    Past Surgical History   Procedure  Date   .  Hernia repair    .  Heel spur surgery    .  Heer surgery    .  Endarterectomy  12/09/2011     Procedure: ENDARTERECTOMY CAROTID; Surgeon: [name], MD; Location: [facility] OR; Service: Vascular; Laterality: Left; Left Carotid endarterectomy with dacron patch angioplasty    Family History   Problem  Relation  Age of Onset   .  Heart disease  Father     Social History: Married. Per reports that he quit smoking about 10 years ago. His smoking use included Cigarettes. He has a 75 pack-year smoking history. He has never  used smokeless tobacco. He reports that he drinks alcohol. He reports that he does not use illicit drugs.  Allergies   Allergen  Reactions   .  Oxycodone-Acetaminophen      REACTION: feels sick    Hospital meds:   .  antiseptic oral rinse  15 mL  Mouth Rinse  [id number]   .  aspirin  300 mg  Rectal  Daily   .  aspirin  325 mg  Oral  Daily   .  chlorhexidine  15 mL  Mouth Rinse  BID   .  citalopram  10 mg  Oral  Daily   .  insulin aspart  0-15 Units  Subcutaneous  Q4H    Medications Prior to Admission   Medication  Sig  Dispense  Refill   .  amitriptyline (ELAVIL) 10 MG tablet  Take 10 mg by mouth at bedtime.     [name]  aspirin 81 MG EC tablet  Take 81 mg by mouth daily.     .  fenofibrate micronized (LOFIBRA) 200 MG capsule  Take 200 mg by mouth daily before breakfast.     .  lisinopril (PRINIVIL,ZESTRIL) 20 MG tablet  Take 20 mg by mouth daily.     .  metFORMIN (GLUCOPHAGE) 1000 MG tablet  Take 1,000 mg by mouth 2 (two) times daily with a meal.     .  traMADol (ULTRAM) 50 MG tablet  Take 1 tablet (50 mg  total) by mouth every 6 (six) hours as needed for pain.  20 tablet  0   Home:  Home Living  Lives With: Spouse  Receives Help From: Family  Type of Home: House  Home Layout: Two level;Bed/bath upstairs  Home Access: Stairs to enter  Entrance Stairs-Number of Steps: 5  Home Adaptive Equipment: Straight cane  Functional History:  Prior Function  Level of Independence: Independent with basic ADLs;Independent with homemaking with ambulation;Independent with gait;Independent with transfers  Able to Take Stairs?: Reciprically  Driving: Yes  Vocation: Full time employment  Functional Status:  Mobility:  Bed Mobility  Bed Mobility: Yes  Rolling Right: 1: +2 Total assist;Patient percentage (comment);With rail (pt=10%)  Rolling Right Details (indicate cue type and reason): facilitation with verbal and hand over hand cueing to initiate and complete transfer  Right Sidelying to Sit: HOB  elevated (comment degrees);1: +2 Total assist;Patient percentage (comment) (pt=10% , HOB 20degrees)  Right Sidelying to Sit Details (indicate cue type and reason): pt resisting bringing legs off bed and pulled LLE back onto bed 2x before able to leave it down to facilitate trunk and hips into sitting  Supine to Sit: 1: +2 Total assist (20%)  Supine to Sit Details (indicate cue type and reason): pt attempted to initiate moving legs off bed but very stiff hips; facilation to bring knees/hips into flexion and bring off bed as well as elevate trunk  Sit to Supine: 1: +2 Total assist (+3totalpt10%)  Transfers  Transfers: Yes  Sit to Stand: From elevated surface;From bed;Patient percentage (comment);1: +2 Total assist (pt =30%)  Sit to Stand Details (indicate cue type and reason): facilitation for anterior weight shift, RLE blocked, with buckling in full standing and assist to achieve standing. Once standing pt pushes R with max assist to shift weight.  Stand to Sit: 1: +2 Total assist;To chair/3-in-1 (pt=30%)  Stand to Sit Details: assist to control descent reaching with LUE for chair  Stand Pivot Transfers: From elevated surface;1: +2 Total assist (pt=30%)  Stand Pivot Transfer Details (indicate cue type and reason): pivot to L with facilitation to advance LLE and reach with LUE no activation of moving RLE.  Ambulation/Gait  Ambulation/Gait: No   ADL:  ADL  Eating/Feeding: Maximal assistance;Simulated  Eating/Feeding Details (indicate cue type and reason): required up to Mod A sitting EOB  Where Assessed - Eating/Feeding: Edge of bed  Grooming: Performed;Wash/dry face;Maximal assistance  Grooming Details (indicate cue type and reason): pt initiated face washing after given VC's but required hand-over-hand to wash more than his chin. Also, pt with agnosia. Unable to put glasses on without explicit directions or hand-over-hand  Where Assessed - Grooming: Sitting, bed  Upper Body Bathing:  Simulated;Maximal assistance  Where Assessed - Upper Body Bathing: Sitting, bed  Lower Body Bathing: Simulated;+1 Total assistance  Where Assessed - Lower Body Bathing: Sitting, bed  Upper Body Dressing: Not assessed  Lower Body Dressing: Not assessed  Toilet Transfer: Simulated;+2 Total assistance (pt=30%)  Toilet Transfer Details (indicate cue type and reason): pt with hyperextended LLE (and pushing to right) and buckling of RLE during transfer making it difficult for therapists to assist patient in knee flexion to take a step toward left to sit in chair. If still pushing next session, may be easier for transfer to Rt. despite buckling of right knee  Toilet Transfer Method: Stand pivot  Tub/Shower Transfer: Not assessed  Ambulation Related to ADLs: unable  ADL Comments: Pt with visual agnosia. 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  Cognition:  Cognition  Overall Cognitive Status: Impaired  Arousal/Alertness: Awake/alert  Orientation Level: Oriented X4  Attention: Sustained (sustained attention for brief periods (20-30 seconds))  Sustained Attention: Impaired  Sustained Attention Impairment: Verbal basic  Executive Function: Initiating  Initiating: Impaired  Initiating Impairment: Verbal basic;Functional basic  Behaviors: Perseveration (perseveration of head nod)  Cognition  Arousal/Alertness: Awake/alert  Overall Cognitive Status: Impaired  Attention: Impaired  Current Attention Level: Selective  Orientation Level: Oriented X4  Following Commands: Follows one step commands inconsistently (follows very simple direct one word commands)  Awareness of Errors: Decreased awareness of errors made  Decreased Awareness of Errors: Assistance required to correct errors made  Awareness of Errors - Other Comments: pt answering questions with nod (often times an inappropriate nod)  Awareness of Deficits: Decreased awareness of  deficits  Problem Solving: Requires assistance for problem solving  Cognition - Other Comments: demonstrates neglect of the right, can turn head to right with cueing  Blood pressure 160/91, pulse 92, temperature 98.5 F (36.9 C), temperature source Oral, resp. rate 18, height 5' 10.87" (1.8 m), weight 109.6 kg (241 lb 10 oz), SpO2 96.00%.  Physical Exam  Nursing note and vitals reviewed.  Constitutional: He is well-developed, well-nourished, and in no distress.  HENT:  Head: Normocephalic.  Eyes: Conjunctivae, EOM and lids are normal. Pupils are equal, round, and reactive to light.  Neck: Normal range of motion.  Cardiovascular: Normal rate.  Pulmonary/Chest: Effort normal.  Abdominal: Soft.  Neurological:  Patient with mildly diminished visual acuity particularly in the central field. Speech was low volume. Articulation fair to good. Denies any gross sensory abnormalities in the right face arm or leg. Right upper extremity strength grossly 0 to trace out of 5. Right lower extremity grossly 2-5 proximal distal with some apraxia noted. Reflexes were hyperactive at 3+ with toes upward on the right. Left sided neuro exam is normal. Patient decreased insight and awareness. Attention was fair to borderline at best. Mood is quite flat. Oriented to name place and reason is here. He also knew the month.  Skin:  Left CEA site is well-healed with minimal surrounding bruising. Left groin is intact with staples and without drainage.   Results for orders placed during the hospital encounter of 12/09/11 (from the past 48 hour(s))   GLUCOSE, CAPILLARY Status: Abnormal    Collection Time    12/12/11 4:01 PM   Component  Value  Range  Comment    Glucose-Capillary  169 (*)  70 - 99 (mg/dL)     Comment 1  Notify RN     GLUCOSE, CAPILLARY Status: Abnormal    Collection Time    12/12/11 8:11 PM   Component  Value  Range  Comment    Glucose-Capillary  179 (*)  70 - 99 (mg/dL)    GLUCOSE, CAPILLARY Status:  Abnormal    Collection Time    12/12/11 11:50 PM   Component  Value  Range  Comment    Glucose-Capillary  176 (*)  70 - 99 (mg/dL)    CBC Status: Abnormal    Collection Time    12/13/11 4:01 AM   Component  Value  Range  Comment    WBC  12.4 (*)  4.0 - 10.5 (K/uL)     RBC  4.32  4.22 - 5.81 (MIL/uL)     Hemoglobin  12.5 (*)  13.0 - 17.0 (g/dL)     HCT  16.1 (*)  39.0 - 52.0 (%)     MCV  88.2  78.0 - 100.0 (fL)     MCH  28.9  26.0 - 34.0 (pg)     MCHC  32.8  30.0 - 36.0 (g/dL)     RDW  11.9  14.7 - 15.5 (%)     Platelets  236  150 - 400 (K/uL)    BASIC METABOLIC PANEL Status: Abnormal    Collection Time    12/13/11 4:01 AM   Component  Value  Range  Comment    Sodium  133 (*)  135 - 145 (mEq/L)     Potassium  3.9  3.5 - 5.1 (mEq/L)     Chloride  99  96 - 112 (mEq/L)     CO2  25  19 - 32 (mEq/L)     Glucose, Bld  184 (*)  70 - 99 (mg/dL)     BUN  14  6 - 23 (mg/dL)     Creatinine, Ser  8.29  0.50 - 1.35 (mg/dL)     Calcium  8.5  8.4 - 10.5 (mg/dL)     GFR calc non Af Amer  >90  >90 (mL/min)     GFR calc Af Amer  >90  >90 (mL/min)    GLUCOSE, CAPILLARY Status: Abnormal    Collection Time    12/13/11 4:18 AM   Component  Value  Range  Comment    Glucose-Capillary  172 (*)  70 - 99 (mg/dL)    GLUCOSE, CAPILLARY Status: Abnormal    Collection Time    12/13/11 8:01 AM   Component  Value  Range  Comment    Glucose-Capillary  177 (*)  70 - 99 (mg/dL)     Comment 1  Notify RN     GLUCOSE, CAPILLARY Status: Abnormal    Collection Time    12/13/11 12:27 PM   Component  Value  Range  Comment    Glucose-Capillary  199 (*)  70 - 99 (mg/dL)    GLUCOSE, CAPILLARY Status: Abnormal    Collection Time    12/13/11 3:45 PM   Component  Value  Range  Comment    Glucose-Capillary  209 (*)  70 - 99 (mg/dL)     Comment 1  Notify RN     GLUCOSE, CAPILLARY Status: Abnormal    Collection Time    12/13/11 8:01 PM   Component  Value  Range  Comment    Glucose-Capillary  195 (*)  70 - 99 (mg/dL)      GLUCOSE, CAPILLARY Status: Abnormal    Collection Time    12/13/11 11:50 PM   Component  Value  Range  Comment    Glucose-Capillary  204 (*)  70 - 99 (mg/dL)    GLUCOSE, CAPILLARY Status: Abnormal    Collection Time    12/14/11 3:58 AM   Component  Value  Range  Comment    Glucose-Capillary  195 (*)  70 - 99 (mg/dL)    CBC Status: Abnormal    Collection Time    12/14/11 5:50 AM   Component  Value  Range  Comment    WBC  11.4 (*)  4.0 - 10.5 (K/uL)     RBC  4.09 (*)  4.22 - 5.81 (MIL/uL)     Hemoglobin  11.9 (*)  13.0 - 17.0 (g/dL)     HCT  56.2 (*)  13.0 - 52.0 (%)     MCV  87.3  78.0 -  100.0 (fL)     MCH  29.1  26.0 - 34.0 (pg)     MCHC  33.3  30.0 - 36.0 (g/dL)     RDW  16.1  09.6 - 15.5 (%)     Platelets  248  150 - 400 (K/uL)    BASIC METABOLIC PANEL Status: Abnormal    Collection Time    12/14/11 5:50 AM   Component  Value  Range  Comment    Sodium  135  135 - 145 (mEq/L)     Potassium  3.9  3.5 - 5.1 (mEq/L)     Chloride  102  96 - 112 (mEq/L)     CO2  26  19 - 32 (mEq/L)     Glucose, Bld  182 (*)  70 - 99 (mg/dL)     BUN  17  6 - 23 (mg/dL)     Creatinine, Ser  0.45  0.50 - 1.35 (mg/dL)     Calcium  8.5  8.4 - 10.5 (mg/dL)     GFR calc non Af Amer  >90  >90 (mL/min)     GFR calc Af Amer  >90  >90 (mL/min)    GLUCOSE, CAPILLARY Status: Abnormal    Collection Time    12/14/11 7:46 AM   Component  Value  Range  Comment    Glucose-Capillary  186 (*)  70 - 99 (mg/dL)     Comment 1  Documented in Chart      Comment 2  Notify RN      No results found.  Post Admission Physician Evaluation:  1. Functional deficits secondary to thromboembolic left-sided infarcts after CEA.. 2. Patient is admitted to receive collaborative, interdisciplinary care between the physiatrist, rehab nursing staff, and therapy team. 3. Patient's level of medical complexity and substantial therapy needs in context of that medical necessity cannot be provided at a lesser intensity of care such as a  SNF. 4. Patient has experienced substantial functional loss from his/her baseline which was documented above under the "Functional History" and "Functional Status" headings. Judging by the patient's diagnosis, physical exam, and functional history, the patient has potential for functional progress which will result in measurable gains while on inpatient rehab. These gains will be of substantial and practical use upon discharge in facilitating mobility and self-care at the household level. 5. Physiatrist will provide 24 hour management of medical needs as well as oversight of the therapy plan/treatment and provide guidance as appropriate regarding the interaction of the two. 6. 24 hour rehab nursing will assist with bladder management, bowel management, safety, skin/wound care, disease management, medication administration, pain management and patient education and help integrate therapy concepts, techniques,education, etc. 7. PT will assess and treat for: Lower extremity strength, neuromuscular reeducation, cognitive perceptual training, adaptive equipment training, functional mobility. Goals are: Supervision to minimal assistance. 8. OT will assess and treat for: Upper extremity strength, ADLs, functional mobility, neuromuscular reeducation, cognitive perceptual training. Goals are: Minimal assistance. 9. SLP will assess and treat for: Speech/cognition/swallowing. Goals are: Modified independent to supervision. 10. Case Management and Social Worker will assess and treat for psychological issues and discharge planning. 11. Team conference will be held weekly to assess progress toward goals and to determine barriers to discharge. 12. Patient will receive at least 3 hours of therapy per day at least 5 days per week. 13. ELOS and Prognosis: 2-3 weeks excellent Medical Problem List and Plan:  1. DVT Prophylaxis/Anticoagulation: Mechanical: Antiembolism stockings, knee (TED hose) Bilateral  lower extremities    Sequential compression devices, below knee Bilateral lower extremities. Consider Lovenox  2. Pain Management: Utilize Tylenol generously as well as ice and heat. Can use hydrocodone for more significant pain. His pain seems to be in his left groin wound site primarily.  3. Mood: Celexa. Team wanted to provide ego support as patient does appear depressed. Depression screening per social work.  4. HTN: resume low dose ace.  5. DM: continue CBG checks.On tube feeds with elevated BS. Resume metformin and use SSI BS control.  6. Insomnia: When necessary trazodone. Follow sleep patterns.  7. Leukocytosis: Reactive? Monitor for fevers.  Ranelle Oyster, MD

## 2011-12-14 NOTE — Discharge Instructions (Signed)
STROKE/TIA DISCHARGE INSTRUCTIONS SMOKING Cigarette smoking nearly doubles your risk of having a stroke & is the single most alterable risk factor  If you smoke or have smoked in the last 12 months, you are advised to quit smoking for your health.  Most of the excess cardiovascular risk related to smoking disappears within a year of stopping.  Ask you doctor about anti-smoking medications  [place] Quit Line: 1-800-QUIT NOW  Free Smoking Cessation Classes [phone]  CHOLESTEROL Know your levels; limit fat & cholesterol in your diet  Lipid Panel     Component Value Date/Time   CHOL 116 12/10/2011 0425   TRIG 228* 12/10/2011 0425   HDL 19* 12/10/2011 0425   CHOLHDL 6.1 12/10/2011 0425   VLDL 46* 12/10/2011 0425   LDLCALC 51 12/10/2011 0425      Many patients benefit from treatment even if their cholesterol is at goal.  Goal: Total Cholesterol (CHOL) less than 160  Goal:  Triglycerides (TRIG) less than 150  Goal:  HDL greater than 40  Goal:  LDL (LDLCALC) less than 100   BLOOD PRESSURE American Stroke Association blood pressure target is less that 120/80 mm/Hg  Your discharge blood pressure is:  BP: 117/82 mmHg  Monitor your blood pressure  Limit your salt and alcohol intake  Many individuals will require more than one medication for high blood pressure  DIABETES (A1c is a blood sugar average for last 3 months) Goal HGBA1c is under 7% (HBGA1c is blood sugar average for last 3 months)  Diabetes: {STROKE DC DIABETES:22357}    Lab Results  Component Value Date   HGBA1C 7.5* 12/09/2011     Your HGBA1c can be lowered with medications, healthy diet, and exercise.  Check your blood sugar as directed by your physician  Call your physician if you experience unexplained or low blood sugars.  PHYSICAL ACTIVITY/REHABILITATION Goal is 30 minutes at least 4 days per week    {STROKE DC ACTIVITY/REHAB:22359}  Activity decreases your risk of heart attack and stroke and makes your heart  stronger.  It helps control your weight and blood pressure; helps you relax and can improve your mood.  Participate in a regular exercise program.  Talk with your doctor about the best form of exercise for you (dancing, walking, swimming, cycling).  DIET/WEIGHT Goal is to maintain a healthy weight  Your discharge diet is:   *** liquids Your height is:  Height: 5' 10.87" (180 cm) Your current weight is: Weight: 110.9 kg (244 lb 7.8 oz) Your Body Mass Index (BMI) is:  BMI (Calculated): 34.3   Following the type of diet specifically designed for you will help prevent another stroke.  Your goal weight range is:  ***  Your goal Body Mass Index (BMI) is 19-24.  Healthy food habits can help reduce 3 risk factors for stroke:  High cholesterol, hypertension, and excess weight.  RESOURCES Stroke/Support Group:  Call [phone]  they meet the 3rd Sunday of the month on the Rehab Unit at [facility], [place] ( no meetings June, July & Aug).  STROKE EDUCATION PROVIDED/REVIEWED AND GIVEN TO PATIENT Stroke warning signs and symptoms How to activate emergency medical system (call 911). Medications prescribed at discharge. Need for follow-up after discharge. Personal risk factors for stroke. Pneumonia vaccine given:   {STROKE DC YES/NO/DATE:22363} Flu vaccine given:   {STROKE DC YES/NO/DATE:22363} My questions have been answered, the writing is legible, and I understand these instructions.  I will adhere to these goals & educational materials that  have been provided to me after my discharge from the hospital.

## 2011-12-14 NOTE — Progress Notes (Signed)
Speech Pathology: Dysphagia Treatment Note  Patient was observed with : Pureed and Nectar liquids.  Patient was noted to have s/s of aspiration : No  Lung Sounds:  Clear; lower lobes clear, diminished Temperature: 97.8  Patient required: max verbal cues to consistently follow precautions/strategies  Clinical Impression: SLP observed pt at bedside to assess diet tolerance and cognitive-linguistic function. Minimal po trials were observed secondary to pt fatigue and lack of appetite, however no s/s of aspiration were observed and vocal quality remained clear. Education regarding diet recommendation and swallow precautions were reinforced by SLP. Pt sustained attention to basic ADL for 60 seconds with mod verbal cues from SLP. Mod verbal, visual, and tactile cues from SLP were needed for completion of basic one-step commands within ADL. Pt correctly identified a common object from a field of 2, however he perseverated on that item across the rest of the trials. Expressive communication has improved to the sentence level.  Overall, current diet remains appropriate at this time. SLP will f/u for diet advancement as oral motor function and cognitive-linguistic skills continue to improve for improved ability to follow directions and utilize compensatory strategies.   Recommendations:  1. Continue aphasia tx 2. Continue current diet to assess diet tolerance and readiness for possible diet advancement  Pain:   none Intervention Required:   No  Goals: progressing    Maxcine Ham, SLP Student

## 2011-12-14 NOTE — Progress Notes (Signed)
Physical Therapy Treatment Patient Details Name: Darrell [name] MRN: [id number] DOB: October 09, 1946 Today's Date: 12/14/2011  PT Assessment/Plan  PT - Assessment/Plan Comments on Treatment Session: Pt s/p L CEA and L CVA. Pt progressing with sitting balance and RLE strength today. Wife present throughout and educated for progress and encouraged to continue HEP as able to maximize pt ability. Continue to recommend lift for OOB and return to bed with nursing. PT Plan: Discharge plan remains appropriate Follow Up Recommendations: Inpatient Rehab PT Goals  Acute Rehab PT Goals PT Goal: Rolling Supine to Right Side - Progress: Progressing toward goal PT Goal: Rolling Supine to Left Side - Progress: Progressing toward goal PT Goal: Supine/Side to Sit - Progress: Progressing toward goal PT Goal: Sit at Edge Of Bed - Progress: Progressing toward goal PT Goal: Sit to Stand - Progress: Progressing toward goal PT Goal: Stand to Sit - Progress: Progressing toward goal PT Transfer Goal: Bed to Chair/Chair to Bed - Progress: Progressing toward goal PT Goal: Stand - Progress: Progressing toward goal PT Goal: Ambulate - Progress: Other (comment) (slow progression with standing not ready for ambulation yet) PT Goal: Perform Home Exercise Program - Progress: Progressing toward goal  PT Treatment Precautions/Restrictions  Precautions Precautions: Fall Precaution Comments: pt pushes R in standing Restrictions Weight Bearing Restrictions: No Mobility (including Balance) Bed Mobility Rolling Right: 1: +1 Total assist;With rail Rolling Right Details (indicate cue type and reason): Pt would bend L knee on command and assist with rotating hips but required increased time and hand over hand assist to reach for rail and rotate trunk Right Sidelying to Sit: HOB elevated (comment degrees);Other (comment);1: +1 Total assist (HOB 20degrees) Right Sidelying to Sit Details (indicate cue type and reason):  facilitation at hips and trunk, pt =25% Transfers Sit to Stand: 1: +2 Total assist;From bed;From elevated surface;Patient percentage (comment) (pt=35%) Sit to Stand Details (indicate cue type and reason): RLE blocked pt buckles in standing without support. Decreased trunk extension in standing today but not pushing R. 2 trials from bed with max cueing and facilitation Stand to Sit: 1: +2 Total assist;To bed;To chair/3-in-1;Patient percentage (comment) (pt=30%) Stand to Sit Details: assist for control of descent  Stand Pivot Transfers: 1: +2 Total assist;Patient percentage (comment) (pt=35%) Stand Pivot Transfer Details (indicate cue type and reason): pt leaning into the transfer today instead of pushing away from it, decreased trunk extension and assisting with initiation of moving LLE toward chair Ambulation/Gait Ambulation/Gait: No Stairs: No  Posture/Postural Control Posture/Postural Control: No significant limitations Static Sitting Balance Static Sitting - Balance Support: Left upper extremity supported;Feet supported Static Sitting - Level of Assistance: 5: Stand by assistance;4: Min assist Static Sitting - Comment/# of Minutes: 6 min, tactile cueing when pt begain to lose balance to left or right, mostly minguard assist for balance today only brief periods of min assist Static Standing Balance Static Standing - Balance Support: Bilateral upper extremity supported Static Standing - Level of Assistance: 1: +2 Total assist;Patient percentage (comment) Static Standing - Comment/# of Minutes: pt=35% with R knee blocked, facilitation for trunk and hip extension Exercise  General Exercises - Lower Extremity Short Arc Quad: AROM;Right;10 reps;Seated;Other (comment) (tactile and verbal cueing to complete) Hip Flexion/Marching: AROM;Both;Seated;Other reps (comment) (20xAROM RLE, 12x AROM LLE) End of Session PT - End of Session Equipment Utilized During Treatment: Gait belt Activity  Tolerance: Patient limited by fatigue Patient left: in chair;with call bell in reach;with family/visitor present Nurse Communication: Mobility status for transfers;Need for lift equipment  General Behavior During Session: [facility] for tasks performed Cognition: Impaired Cognitive Impairment: Pt unable to state name, wife's name or her relationship to him but could accurately respond with yes or no to the same question. Delayed response to commands grossly 10 sec and difficulty following commands for reaching without hand over hand initiation.  Toney Sang Beth 12/14/2011, 1:55 PM Toney Sang, PT [phone]

## 2011-12-14 NOTE — Progress Notes (Signed)
Inpatient Diabetes Program Recommendations  AACE/ADA: New Consensus Statement on Inpatient Glycemic Control (2009)  Target Ranges:  Prepandial:   less than 140 mg/dL      Peak postprandial:   less than 180 mg/dL (1-2 hours)      Critically ill patients:  140 - 180 mg/dL   Reason for Visit: Hyperglycemia  Results for Darrell [name], Darrell [name] (MRN [id number]) as of 12/14/2011 09:18  Ref. Range 12/13/2011 12:27 12/13/2011 15:45 12/13/2011 20:01 12/13/2011 23:50 12/14/2011 03:58 12/14/2011 07:46  Glucose-Capillary Latest Range: 70-99 mg/dL 132 (H) 440 (H) 102 (H) 204 (H) 195 (H) 186 (H)    Inpatient Diabetes Program Recommendations Insulin - Basal: Needs basal insulin - Lantus 10 units QHS HgbA1C: *

## 2011-12-14 NOTE — H&P (Signed)
Physical Medicine and Rehabilitation Admission H&P  Chief Complaint: Right sided weakness and speech difficulties : HPI: Darrell [name] is an 66 y.o. 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 Hart Rochester. In recovery, patient with decreased LOC and inability to move right side. Patient taken back to OR emergently for exploration of L-CEA with thrombectomy of meniscectomized surface and distal ICA. Neurology and Hem/Onc consulted for input. 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 Hypercoagulation workup in progress noted to have positive lupus anticoagulant but not reliable due to proximity of treatment with heparin.  Patient on ASA per neurology recommendations. PT/ST evaluations done yesterday. MBS done  On 02/15 and diet initiated this weekend- DI, nectar liquids. Expressive communication improving.  Patient able to complete one step commands with moderate verbal cues. Continues with left gaze preference with right hemiparesis and sensory deficits right side.    Review of Systems  Eyes: Positive for blurred vision (right visual deficits- seeing black spot centrally.).   Past Medical History  Diagnosis Date  . Diabetes mellitus   . Sleep apnea     cpap      sleep study > 4 yrs  . Hypertension     dr Tawanna Cooler   Past Surgical History  Procedure Date  . Hernia repair   . Heel spur surgery   . Heer surgery   . Endarterectomy 12/09/2011    Procedure: ENDARTERECTOMY CAROTID;  Surgeon: [name], MD;  Location: [facility] OR;  Service: Vascular;  Laterality: Left;  Left Carotid endarterectomy with dacron patch angioplasty   Family History  Problem Relation Age of Onset  . Heart disease Father    Social History: Married.  Per reports that he quit smoking about 10 years ago. His smoking use included Cigarettes. He has a 75 pack-year smoking history. He has never used smokeless  tobacco. He reports that he drinks alcohol. He reports that he does not use illicit drugs.   Allergies  Allergen Reactions  . Oxycodone-Acetaminophen     REACTION: feels sick   Hospital meds:    . antiseptic oral rinse  15 mL Mouth Rinse q12n4p  . aspirin  300 mg Rectal Daily  . aspirin  325 mg Oral Daily  . chlorhexidine  15 mL Mouth Rinse BID  . citalopram  10 mg Oral Daily  . insulin aspart  0-15 Units Subcutaneous Q4H   Medications Prior to Admission  Medication Sig Dispense Refill  . amitriptyline (ELAVIL) 10 MG tablet Take 10 mg by mouth at bedtime.      [name] aspirin 81 MG EC tablet Take 81 mg by mouth daily.        . fenofibrate micronized (LOFIBRA) 200 MG capsule Take 200 mg by mouth daily before breakfast.      . lisinopril (PRINIVIL,ZESTRIL) 20 MG tablet Take 20 mg by mouth daily.      . metFORMIN (GLUCOPHAGE) 1000 MG tablet Take 1,000 mg by mouth 2 (two) times daily with a meal.       . traMADol (ULTRAM) 50 MG tablet Take 1 tablet (50 mg total) by mouth every 6 (six) hours as needed for pain.  20 tablet  0    Home: Home Living Lives With: Spouse Receives Help From: Family Type of Home: House Home Layout: Two level;Bed/bath upstairs Home Access: Stairs to enter Entrance Stairs-Number of Steps: 5 Home Adaptive Equipment: Straight cane  Functional History: Prior Function Level of Independence: Independent with basic ADLs;Independent with homemaking with ambulation;Independent with gait;Independent with transfers Able to Take Stairs?: Reciprically Driving: Yes Vocation: Full time employment  Functional Status:  Mobility: Bed Mobility Bed Mobility: Yes Rolling Right: 1: +2 Total assist;Patient percentage (comment);With rail (pt=10%) Rolling Right Details (indicate cue type and reason): facilitation with verbal and hand over hand cueing to initiate and complete transfer Right Sidelying to Sit: HOB elevated (comment degrees);1: +2 Total assist;Patient percentage  (comment) (pt=10% , HOB 20degrees) Right Sidelying to Sit Details (indicate cue type and reason): pt resisting bringing legs off bed and pulled LLE back onto bed 2x before able to leave it down to facilitate trunk and hips into sitting Supine to Sit: 1: +2 Total assist (20%) Supine to Sit Details (indicate cue type and reason): pt attempted to initiate moving legs off bed but very stiff hips; facilation to bring knees/hips into flexion and bring off bed as well as elevate trunk Sit to Supine: 1: +2 Total assist (+3totalpt10%) Transfers Transfers: Yes Sit to Stand: From elevated surface;From bed;Patient percentage (comment);1: +2 Total assist (pt =30%) Sit to Stand Details (indicate cue type and reason): facilitation for anterior weight shift, RLE blocked, with buckling in full standing and assist to achieve standing. Once standing pt pushes R with max assist to shift weight. Stand to Sit: 1: +2 Total assist;To chair/3-in-1 (pt=30%) Stand to Sit Details: assist to control descent reaching with LUE for chair Stand Pivot Transfers: From elevated surface;1: +2 Total assist (pt=30%) Stand Pivot Transfer Details (indicate cue type and reason): pivot to L with facilitation to advance LLE and reach with LUE no activation of moving RLE. Ambulation/Gait Ambulation/Gait: No    ADL: ADL Eating/Feeding: Maximal assistance;Simulated Eating/Feeding Details (indicate cue type and reason): required up to Mod A sitting EOB Where Assessed - Eating/Feeding: Edge of bed Grooming: Performed;Wash/dry face;Maximal assistance Grooming Details (indicate cue type and reason): pt initiated face washing after given VC's but required hand-over-hand to wash more than his chin. Also, pt with agnosia. Unable to put glasses on without explicit directions or hand-over-hand  Where Assessed - Grooming: Sitting, bed Upper Body Bathing: Simulated;Maximal assistance Where Assessed - Upper Body Bathing: Sitting, bed Lower Body  Bathing: Simulated;+1 Total assistance Where Assessed - Lower Body Bathing: Sitting, bed Upper Body Dressing: Not assessed Lower Body Dressing: Not assessed Toilet Transfer: Simulated;+2 Total assistance (pt=30%) Toilet Transfer Details (indicate cue type and reason): pt with hyperextended LLE (and pushing to right) and buckling of RLE during transfer making it difficult for therapists to assist patient in knee flexion to take a step toward left to sit in chair. If still pushing next session, may be easier for transfer to Rt. despite buckling of right knee Toilet Transfer Method: Stand pivot Tub/Shower Transfer: Not assessed Ambulation Related to ADLs: unable ADL Comments: Pt with visual agnosia. 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  Cognition: Cognition Overall Cognitive Status: Impaired Arousal/Alertness: Awake/alert Orientation Level: Oriented X4 Attention: Sustained (sustained attention for brief periods (20-30 seconds)) Sustained Attention: Impaired Sustained Attention Impairment: Verbal basic Executive Function: Initiating Initiating: Impaired Initiating Impairment: Verbal basic;Functional basic Behaviors: Perseveration (perseveration of head nod) Cognition Arousal/Alertness: Awake/alert Overall Cognitive Status: Impaired Attention: Impaired Current Attention Level: Selective Orientation Level: Oriented X4 Following Commands: Follows one step commands inconsistently (follows very simple direct one word commands) Awareness of Errors: Decreased awareness of errors made Decreased Awareness of Errors: Assistance required to correct errors  made Awareness of Errors - Other Comments: pt answering questions with nod (often times an inappropriate nod) Awareness of Deficits: Decreased awareness of deficits Problem Solving: Requires assistance for problem solving Cognition - Other Comments: demonstrates neglect of the  right, can turn head to right with cueing   Blood pressure 160/91, pulse 92, temperature 98.5 F (36.9 C), temperature source Oral, resp. rate 18, height 5' 10.87" (1.8 m), weight 109.6 kg (241 lb 10 oz), SpO2 96.00%. Physical Exam  Nursing note and vitals reviewed. Constitutional: He is well-developed, well-nourished, and in no distress.  HENT:  Head: Normocephalic.  Eyes: Conjunctivae, EOM and lids are normal. Pupils are equal, round, and reactive to light.  Neck: Normal range of motion.  Cardiovascular: Normal rate.   Pulmonary/Chest: Effort normal.  Abdominal: Soft.  Neurological:       Patient with mildly diminished visual acuity particularly in the central field. Speech was low volume. Articulation fair to good. Denies any gross sensory abnormalities in the right face arm or leg. Right upper extremity strength grossly 0 to trace out of 5. Right lower extremity grossly 2-5 proximal distal with some apraxia noted. Reflexes were hyperactive at 3+ with toes upward on the right. Left sided neuro exam is normal. Patient decreased insight and awareness. Attention was fair to borderline at best. Mood is quite flat. Oriented to name place and reason is here. He also knew the month.  Skin:       Left CEA site is well-healed with minimal surrounding bruising. Left groin is intact with staples and without drainage.    Results for orders placed during the hospital encounter of 12/09/11 (from the past 48 hour(s))  GLUCOSE, CAPILLARY     Status: Abnormal   Collection Time   12/12/11  4:01 PM      Component Value Range Comment   Glucose-Capillary 169 (*) 70 - 99 (mg/dL)    Comment 1 Notify RN     GLUCOSE, CAPILLARY     Status: Abnormal   Collection Time   12/12/11  8:11 PM      Component Value Range Comment   Glucose-Capillary 179 (*) 70 - 99 (mg/dL)   GLUCOSE, CAPILLARY     Status: Abnormal   Collection Time   12/12/11 11:50 PM      Component Value Range Comment   Glucose-Capillary 176 (*)  70 - 99 (mg/dL)   CBC     Status: Abnormal   Collection Time   12/13/11  4:01 AM      Component Value Range Comment   WBC 12.4 (*) 4.0 - 10.5 (K/uL)    RBC 4.32  4.22 - 5.81 (MIL/uL)    Hemoglobin 12.5 (*) 13.0 - 17.0 (g/dL)    HCT 30.8 (*) 65.7 - 52.0 (%)    MCV 88.2  78.0 - 100.0 (fL)    MCH 28.9  26.0 - 34.0 (pg)    MCHC 32.8  30.0 - 36.0 (g/dL)    RDW 84.6  96.2 - 95.2 (%)    Platelets 236  150 - 400 (K/uL)   BASIC METABOLIC PANEL     Status: Abnormal   Collection Time   12/13/11  4:01 AM      Component Value Range Comment   Sodium 133 (*) 135 - 145 (mEq/L)    Potassium 3.9  3.5 - 5.1 (mEq/L)    Chloride 99  96 - 112 (mEq/L)    CO2 25  19 - 32 (mEq/L)    Glucose, Bld  184 (*) 70 - 99 (mg/dL)    BUN 14  6 - 23 (mg/dL)    Creatinine, Ser 1.61  0.50 - 1.35 (mg/dL)    Calcium 8.5  8.4 - 10.5 (mg/dL)    GFR calc non Af Amer >90  >90 (mL/min)    GFR calc Af Amer >90  >90 (mL/min)   GLUCOSE, CAPILLARY     Status: Abnormal   Collection Time   12/13/11  4:18 AM      Component Value Range Comment   Glucose-Capillary 172 (*) 70 - 99 (mg/dL)   GLUCOSE, CAPILLARY     Status: Abnormal   Collection Time   12/13/11  8:01 AM      Component Value Range Comment   Glucose-Capillary 177 (*) 70 - 99 (mg/dL)    Comment 1 Notify RN     GLUCOSE, CAPILLARY     Status: Abnormal   Collection Time   12/13/11 12:27 PM      Component Value Range Comment   Glucose-Capillary 199 (*) 70 - 99 (mg/dL)   GLUCOSE, CAPILLARY     Status: Abnormal   Collection Time   12/13/11  3:45 PM      Component Value Range Comment   Glucose-Capillary 209 (*) 70 - 99 (mg/dL)    Comment 1 Notify RN     GLUCOSE, CAPILLARY     Status: Abnormal   Collection Time   12/13/11  8:01 PM      Component Value Range Comment   Glucose-Capillary 195 (*) 70 - 99 (mg/dL)   GLUCOSE, CAPILLARY     Status: Abnormal   Collection Time   12/13/11 11:50 PM      Component Value Range Comment   Glucose-Capillary 204 (*) 70 - 99 (mg/dL)     GLUCOSE, CAPILLARY     Status: Abnormal   Collection Time   12/14/11  3:58 AM      Component Value Range Comment   Glucose-Capillary 195 (*) 70 - 99 (mg/dL)   CBC     Status: Abnormal   Collection Time   12/14/11  5:50 AM      Component Value Range Comment   WBC 11.4 (*) 4.0 - 10.5 (K/uL)    RBC 4.09 (*) 4.22 - 5.81 (MIL/uL)    Hemoglobin 11.9 (*) 13.0 - 17.0 (g/dL)    HCT 09.6 (*) 04.5 - 52.0 (%)    MCV 87.3  78.0 - 100.0 (fL)    MCH 29.1  26.0 - 34.0 (pg)    MCHC 33.3  30.0 - 36.0 (g/dL)    RDW 40.9  81.1 - 91.4 (%)    Platelets 248  150 - 400 (K/uL)   BASIC METABOLIC PANEL     Status: Abnormal   Collection Time   12/14/11  5:50 AM      Component Value Range Comment   Sodium 135  135 - 145 (mEq/L)    Potassium 3.9  3.5 - 5.1 (mEq/L)    Chloride 102  96 - 112 (mEq/L)    CO2 26  19 - 32 (mEq/L)    Glucose, Bld 182 (*) 70 - 99 (mg/dL)    BUN 17  6 - 23 (mg/dL)    Creatinine, Ser 7.82  0.50 - 1.35 (mg/dL)    Calcium 8.5  8.4 - 10.5 (mg/dL)    GFR calc non Af Amer >90  >90 (mL/min)    GFR calc Af Amer >90  >90 (mL/min)   GLUCOSE, CAPILLARY  Status: Abnormal   Collection Time   12/14/11  7:46 AM      Component Value Range Comment   Glucose-Capillary 186 (*) 70 - 99 (mg/dL)    Comment 1 Documented in Chart      Comment 2 Notify RN      No results found.  Post Admission Physician Evaluation: 1. Functional deficits secondary  to thromboembolic left-sided infarcts after CEA.. 2. Patient is admitted to receive collaborative, interdisciplinary care between the physiatrist, rehab nursing staff, and therapy team. 3. Patient's level of medical complexity and substantial therapy needs in context of that medical necessity cannot be provided at a lesser intensity of care such as a SNF. 4. Patient has experienced substantial functional loss from his/her baseline which was documented above under the "Functional History" and "Functional Status" headings.  Judging by the patient's  diagnosis, physical exam, and functional history, the patient has potential for functional progress which will result in measurable gains while on inpatient rehab.  These gains will be of substantial and practical use upon discharge  in facilitating mobility and self-care at the household level. 5. Physiatrist will provide 24 hour management of medical needs as well as oversight of the therapy plan/treatment and provide guidance as appropriate regarding the interaction of the two. 6. 24 hour rehab nursing will assist with bladder management, bowel management, safety, skin/wound care, disease management, medication administration, pain management and patient education  and help integrate therapy concepts, techniques,education, etc. 7. PT will assess and treat for:  Lower extremity strength, neuromuscular reeducation, cognitive perceptual training, adaptive equipment training, functional mobility.  Goals are: Supervision to minimal assistance. 8. OT will assess and treat for: Upper extremity strength, ADLs, functional mobility, neuromuscular reeducation, cognitive perceptual training.   Goals are: Minimal assistance. 9. SLP will assess and treat for: Speech/cognition/swallowing.  Goals are: Modified independent to supervision. 10. Case Management and Social Worker will assess and treat for psychological issues and discharge planning. 11. Team conference will be held weekly to assess progress toward goals and to determine barriers to discharge. 12.  Patient will receive at least 3 hours of therapy per day at least 5 days per week. 13. ELOS and Prognosis: 2-3 weeks excellent   Medical Problem List and Plan: 1. DVT Prophylaxis/Anticoagulation: Mechanical:  Antiembolism stockings, knee (TED hose) Bilateral lower extremities Sequential compression devices, below knee Bilateral lower extremities. Consider Lovenox 2. Pain Management: Utilize Tylenol generously as well as ice and heat. Can use hydrocodone for  more significant pain. His pain seems to be in his left groin wound site primarily. 3. Mood: Celexa. Team wanted to provide ego support as patient does appear depressed. Depression screening per social work. 4. HTN: resume low dose ace. 5. DM: continue CBG checks.On tube feeds with elevated BS.  Resume metformin and use SSI BS control.   6. Insomnia: When necessary trazodone. Follow sleep patterns. 7. Leucocytosis:  Reactive?  Monitor for fevers.    Ranelle Oyster, MD Jacquelynn Cree 12/14/2011, 1:26 PM

## 2011-12-14 NOTE — Consult Note (Signed)
Discharge instructions reviewed with patient and wife. Patient and wife acknowledge understanding and have no questions at this time.

## 2011-12-14 NOTE — Progress Notes (Addendum)
VASCULAR AND VEIN SURGERY POST - OP CEA PROGRESS NOTE  Date of Surgery: 12/09/2011 Surgeon: Moishe Spice): [name], MD 5 Days Post-Op left Carotid Endarterectomy .  HPI: Darrell [name] is a 66 y.o. male who is 5 Days Post-Op left Carotid Endarterectomy with take back for thrombosed ICA. Patient is doing well, more verbal. Lifting right leg with more strength. Following commands well. Post-operative symptoms are Improved Patient denies headache; Passed swallow test - Dysphagia 1 diet IMAGING: Dg Swallowing Func-no Report  12/12/2011  CLINICAL DATA: dysphagia   FLUOROSCOPY FOR SWALLOWING FUNCTION STUDY:  Fluoroscopy was provided for swallowing function study, which was  administered by a speech pathologist.  Final results and recommendations  from this study are contained within the speech pathology report.      Significant Diagnostic Studies: CBC Lab Results  Component Value Date   WBC 11.4* 12/14/2011   HGB 11.9* 12/14/2011   HCT 35.7* 12/14/2011   MCV 87.3 12/14/2011   PLT 248 12/14/2011    BMET    Component Value Date/Time   NA 135 12/14/2011 0550   K 3.9 12/14/2011 0550   CL 102 12/14/2011 0550   CO2 26 12/14/2011 0550   GLUCOSE 182* 12/14/2011 0550   GLUCOSE 141* 10/05/2006 0904   BUN 17 12/14/2011 0550   CREATININE 0.64 12/14/2011 0550   CALCIUM 8.5 12/14/2011 0550   GFRNONAA >90 12/14/2011 0550   GFRAA >90 12/14/2011 0550    COAG Lab Results  Component Value Date   INR 1.05 12/09/2011   INR 1.16 12/09/2011   INR 1.07 12/08/2011   No results found for this basename: PTT      Intake/Output Summary (Last 24 hours) at 12/14/11 0814 Last data filed at 12/14/11 0700  Gross per 24 hour  Intake   1150 ml  Output   1110 ml  Net     40 ml    Physical Exam:  BP Readings from Last 3 Encounters:  12/14/11 131/80  12/14/11 131/80  12/14/11 131/80   Temp Readings from Last 3 Encounters:  12/14/11 97.8 F (36.6 C) Oral  12/14/11 97.8 F (36.6 C) Oral  12/14/11 97.8  F (36.6 C) Oral   SpO2 Readings from Last 3 Encounters:  12/14/11 97%  12/14/11 97%  12/14/11 97%   Pulse Readings from Last 3 Encounters:  12/14/11 73  12/14/11 73  12/14/11 73    Pt is A&O x 3 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 male is S/P Carotid endarterectomy Pt neuro status still improving except no active movement of RUE although some tone noted with passive movement Swallow study - pt able to protect airway - DYS 1 Diet with guarded swallow   Plan: transfer to [facility] to evaluate Cont PT/OT/ST  [name] [phone] 12/14/2011 8:14 AM   I agree with the above evaluation. I feel the patient's right leg is somewhat stronger. His verbal skills have also improved. He still cannot move his right arm. He has not tried a diet however he did pass a swallow study for a dysphagia 1 diet. Will most likely transfer him to rehabilitation in the immediate future.  Durene Cal, M.D.

## 2011-12-14 NOTE — Plan of Care (Signed)
Overall Plan of Care [facility]) Patient Details Name: Darrell [name] MRN: [id number] DOB: 08-14-1946  Diagnosis:  Rehabilitation for CVA  Primary Diagnosis:    CVA (cerebral infarction) R flaccid hemiparesis Co-morbidities: Diabetes mellitus  Sleep apnea  cpap sleep study > 4 yrs  Hypertension   Functional Problem List  Patient demonstrates impairments in the following areas: Balance, Bladder, Cognition, Endurance, Linguistic, Motor, Pain, Perception, Safety and Sensory   Basic ADL's: eating, grooming, bathing, dressing and toileting Advanced ADL's: NA  Transfers:  bed mobility, bed to chair, toilet, tub/shower, car and furniture Locomotion:  ambulation and wheelchair mobility  Additional Impairments:  Swallowing, Communication  comprehension and expression and Social Cognition   social interaction, problem solving, memory, attention and awareness  Anticipated Outcomes Item Anticipated Outcome  Eating/Swallowing  Supervision  Basic self-care  Min assist  Tolieting  Min assist  Bowel/Bladder  Modified Independent  Transfers  Min assist  Locomotion  Supervision W/C mobility  Communication  Moderate assist  Cognition  Moderate assist  Pain  At or below level 3  Safety/Judgment  Moderate assist  Other     Therapy Plan: PT Frequency: 2-3 X/day, 60-90 minutesPT: 2-3 times/day, 60-90 min, 5 of 7 days/week OT Frequency: 1-2 X/day, 60-90 minutes SLP Frequency: 2-3 X/day, 30-60 minutes   Team Interventions: Item RN PT OT SLP SW TR Other  Self Care/Advanced ADL Retraining  x x      Neuromuscular Re-Education  x x      Therapeutic Activities  x x x     UE/LE Strength Training/ROM  x x      UE/LE Coordination Activities  x x      Visual/Perceptual Remediation/Compensation  x x      DME/Adaptive Equipment Instruction  x x      Therapeutic Exercise  x x x     Balance/Vestibular Training  x x      Patient/Family Education X x x x     Cognitive  Remediation/Compensation  x x x     Functional Mobility Training  x x      Ambulation/Gait Training  x       Stair Training  x       Wheelchair Propulsion/Positioning  x       Functional Tourist information centre manager Reintegration  x x      Dysphagia/Aspiration Printmaker    x     Speech/Language Facilitation         Bladder Management X        Bowel Management X        Disease Management/Prevention X        Pain Management X        Medication Management X        Skin Care/Wound Management X        Splinting/Orthotics  x x      Discharge Planning  x x  x    Psychosocial Support X x x  x                       Team Discharge Planning: Destination:  Home Projected Follow-up:  PT, OT, SLP and Home Health Projected Equipment Needs:  Wheelchair OT - TBD (most likely shower chair and BSC) Patient/family involved in discharge planning:  Yes  MD ELOS: 3 wks  Medical Rehab Prognosis:  Good Assessment: 66 yo male with L carotid stenosis had perioperative watershed  infarct causing R hemiparesis, now requiring CIR level PT,OT,SLP,24/7 rehab RN and MD.

## 2011-12-14 NOTE — PMR Pre-admission (Signed)
PMR Admission Coordinator Pre-Admission Assessment  Patient:  Darrell [name] is an 66 y.o., male MRN:  [id number] DOB:  11/17/1945 Height:  5' 10.87" (180 cm) Weight:  109.6 kg (241 lb 10 oz)  Insurance Information: HMO     PPO:      PCP:      IPA:      80/20:      OTHER:  PRIMARY:Medicare A/B      Policy#:[id number]      Subscriber:Hakim Whichard CM Name:       Phone#:      Fax#:  Pre-Cert#:       Employer: self employed Benefits:  Phone #:      Name:[employer] Eff. Date:01/24/10     Deduct:$1184      Out of Pocket Max:0      Life WUJ:[id number] CIR:100%      SNF:100 days   LBD = none Outpatient:80%     Co-Pay:20% Home Health:100%      Co-Pay:none DME:80%     Co-Pay:20% Providers:patient's choice  SECONDARY:[employer]      Policy#:[id number]      Subscriber:Tory Whichard CM Name:       Phone#:      Fax#:  Pre-Cert#:Not required       Employer:self employed Benefits:  Phone #[phone]     Name:  Eff. Date:01/24/10     Deduct:       Out of Pocket Max:       Life Max:  CIR:       SNF:  Outpatient:      Co-Pay:  Home Health:       Co-Pay:  DME:      Co-Pay:  Current Medical History:   Patient Admitting Diagnosis: L MCA/PCA and L BG CVA   History of Present Illness:  Admitted on 2/13 for L CEA by Dr. Hart Rochester.  In recovery with decreased LOC and inability to move right side.  Patient taken back to OR emergently for exploration of L CEA with thrombectomy of meniscectomized surface and distal ICA.  MRI revealed non-hemorrhagic infarcts in left MCA/PCA distribution and focal infarct in left BG.  Hypercoagulation workup done.  Diet started dysphagia 1 with nectar thick liquids.    Patients Past Medical History:   Past Medical History  Diagnosis Date  . Diabetes mellitus   . Sleep apnea     cpap      sleep study > 4 yrs  . Hypertension     dr Tawanna Cooler   Family Medical History:  family history includes Heart disease in his father. NIH Stroke scale:  Not done Patients Current Diet:  Dysphagia  Prior Rehab/Hospitalizations: None  Current Medications: Current facility-administered medications:0.9 %  sodium chloride infusion, , Intravenous, Continuous, V Durene Cal, MD, Last Rate: 50 mL/hr at 12/14/11 0700;  antiseptic oral rinse (BIOTENE) solution 15 mL, 15 mL, Mouth Rinse, q12n4p, [name], MD, 15 mL at 12/14/11 1200;  aspirin suppository 300 mg, 300 mg, Rectal, Daily, [name], MD, 300 mg at 12/12/11 1020 aspirin tablet 325 mg, 325 mg, Oral, Daily, [name], MD, 325 mg at 12/14/11 1108;  chlorhexidine (PERIDEX) 0.12 % solution 15 mL, 15 mL, Mouth Rinse, BID, [name], MD, 15 mL at 12/14/11 0800;  citalopram (CELEXA) tablet 10 mg, 10 mg, Oral, Daily, V Durene Cal, MD, 10 mg at 12/14/11 1109;  DOPamine (INTROPIN) 800 mg in dextrose 5 % 250 mL infusion, 3-5 mcg/kg/min, Intravenous, Continuous, [name], MD food thickener (RESOURCE THICKENUP  CLEAR) powder, , Oral, PRN, Maryanna Shape [place], [place];  hydrALAZINE (APRESOLINE) injection 10 mg, 10 mg, Intravenous, Q2H PRN, [name], PA;  HYDROmorphone (DILAUDID) injection 0.5-1 mg, 0.5-1 mg, Intravenous, Q2H PRN, [name], [place], 1 mg at 12/14/11 0030;  insulin aspart (novoLOG) injection 0-15 Units, 0-15 Units, Subcutaneous, Q4H, Amelia [place], [place], 8 Units at 12/14/11 1221 labetalol (NORMODYNE,TRANDATE) injection 10 mg, 10 mg, Intravenous, Q2H PRN, [name], PA;  metoprolol (LOPRESSOR) injection 2-5 mg, 2-5 mg, Intravenous, Q2H PRN, [name], PA;  ondansetron [facility]) injection 4 mg, 4 mg, Intravenous, Q6H PRN, Amelia Jo [place], PA, 4 mg at 12/11/11 0120;  phenol (CHLORASEPTIC) mouth spray 1 spray, 1 spray, Mouth/Throat, PRN, [name], PA  Additional Precautions/Restrictions: Precautions Precautions: Fall Precaution Comments: pt pushes R in standing Restrictions Weight Bearing Restrictions: No  Therapy Assessments Physical Therapy: Precautions Precautions:  Fall Precaution Comments: pt pushes R in standing Home Living Lives With: Spouse Receives Help From: Family Type of Home: House Home Layout: Two level;Bed/bath upstairs Home Access: Stairs to enter Entrance Stairs-Number of Steps: 5 Home Adaptive Equipment: Straight cane Prior Function Level of Independence: Independent with basic ADLs;Independent with homemaking with ambulation;Independent with gait;Independent with transfers Able to Take Stairs?: Reciprically Driving: Yes Vocation: Full time employment Coordination Gross Motor Movements are Fluid and Coordinated: No Fine Motor Movements are Fluid and Coordinated: No Coordination and Movement Description: noted resting tremor in LLE  Occupational Therapy: Precautions Precautions: Fall Precaution Comments: pt pushes R in standing Home Living Lives With: Spouse Receives Help From: Family Type of Home: House Home Layout: Two level;Bed/bath upstairs Home Access: Stairs to enter Entrance Stairs-Number of Steps: 5 Home Adaptive Equipment: Straight cane Prior Function Level of Independence: Independent with basic ADLs;Independent with homemaking with ambulation;Independent with gait;Independent with transfers Able to Take Stairs?: Reciprically Driving: Yes Vocation: Full time employment Coordination Gross Motor Movements are Fluid and Coordinated: No Fine Motor Movements are Fluid and Coordinated: No Coordination and Movement Description: noted resting tremor in LLE Restrictions Weight Bearing Restrictions: No ADL Eating/Feeding: Maximal assistance;Simulated Eating/Feeding Details (indicate cue type and reason): required up to Mod A sitting EOB Where Assessed - Eating/Feeding: Edge of bed Grooming: Performed;Wash/dry face;Maximal assistance Grooming Details (indicate cue type and reason): pt initiated face washing after given VC's but required hand-over-hand to wash more than his chin. Also, pt with agnosia. Unable to put  glasses on without explicit directions or hand-over-hand  Where Assessed - Grooming: Sitting, bed Upper Body Bathing: Simulated;Maximal assistance Where Assessed - Upper Body Bathing: Sitting, bed Lower Body Bathing: Simulated;+1 Total assistance Where Assessed - Lower Body Bathing: Sitting, bed Upper Body Dressing: Not assessed Lower Body Dressing: Not assessed Toilet Transfer: Simulated;+2 Total assistance (pt=30%) Toilet Transfer Details (indicate cue type and reason): pt with hyperextended LLE (and pushing to right) and buckling of RLE during transfer making it difficult for therapists to assist patient in knee flexion to take a step toward left to sit in chair. If still pushing next session, may be easier for transfer to Rt. despite buckling of right knee Toilet Transfer Method: Stand pivot Tub/Shower Transfer: Not assessed Ambulation Related to ADLs: unable ADL Comments: Pt with visual agnosia. 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  SLP Recommendations: Recommendations for Other Services: Rehab consult Follow up Recommendations: Inpatient Rehab Equipment Recommended: Defer to next venue Recommended Consults: MBS (f/u MBS with improved cognitive-linguistic skills) General Recommendation: NPO Solid Consistency: Dysphagia 1 (  Puree) Liquid Consistency: Nectar Liquid Administration via: Cup Medication Administration: Crushed with puree Supervision: Full supervision/cueing for compensatory strategies Compensations: Slow rate;Small sips/bites Postural Changes and/or Swallow Maneuvers: Seated upright 90 degrees Oral Care Recommendations: Oral care BID Other Recommendations: Order thickener from pharmacy Recommendations for Other Services: Rehab consult Follow up Recommendations: Inpatient Rehab  Prior Function: Level of Independence: Independent with basic ADLs;Independent with homemaking with ambulation;Independent with  gait;Independent with transfers Able to Take Stairs?: Reciprically Driving: Yes Vocation: Full time employment ADL Eating/Feeding: Maximal assistance;Simulated Eating/Feeding Details (indicate cue type and reason): required up to Mod A sitting EOB Where Assessed - Eating/Feeding: Edge of bed Grooming: Performed;Wash/dry face;Maximal assistance Grooming Details (indicate cue type and reason): pt initiated face washing after given VC's but required hand-over-hand to wash more than his chin. Also, pt with agnosia. Unable to put glasses on without explicit directions or hand-over-hand  Where Assessed - Grooming: Sitting, bed Upper Body Bathing: Simulated;Maximal assistance Where Assessed - Upper Body Bathing: Sitting, bed Lower Body Bathing: Simulated;+1 Total assistance Where Assessed - Lower Body Bathing: Sitting, bed Upper Body Dressing: Not assessed Lower Body Dressing: Not assessed Toilet Transfer: Simulated;+2 Total assistance (pt=30%) Toilet Transfer Details (indicate cue type and reason): pt with hyperextended LLE (and pushing to right) and buckling of RLE during transfer making it difficult for therapists to assist patient in knee flexion to take a step toward left to sit in chair. If still pushing next session, may be easier for transfer to Rt. despite buckling of right knee Toilet Transfer Method: Stand pivot Tub/Shower Transfer: Not assessed Ambulation Related to ADLs: unable ADL Comments: Pt with visual agnosia. 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  Additional Prior Functional Levels:  Bed Mobility: I Transfers: I Mobility - Walk/Wheelchair: I Upper Body Dressing: I Lower Body Dressing: I Grooming: I Eating/Drinking: I Toilet Transfer: I Bladder Continence: WNL Bowel Management: WNL Stair Climbing: I Communication: WNL Memory: WNL Cooking/Meal Prep: A Housework: A Money Management: I Driving: yes  Prior  Activity Level: Community (5-7x/wk): Worked fulltime as Warden/ranger  ADLs/Mobility: ADL Eating/Feeding: Maximal assistance;Simulated Eating/Feeding Details (indicate cue type and reason): required up to Mod A sitting EOB Where Assessed - Eating/Feeding: Edge of bed Grooming: Performed;Wash/dry face;Maximal assistance Grooming Details (indicate cue type and reason): pt initiated face washing after given VC's but required hand-over-hand to wash more than his chin. Also, pt with agnosia. Unable to put glasses on without explicit directions or hand-over-hand  Where Assessed - Grooming: Sitting, bed Upper Body Bathing: Simulated;Maximal assistance Where Assessed - Upper Body Bathing: Sitting, bed Lower Body Bathing: Simulated;+1 Total assistance Where Assessed - Lower Body Bathing: Sitting, bed Upper Body Dressing: Not assessed Lower Body Dressing: Not assessed Toilet Transfer: Simulated;+2 Total assistance (pt=30%) Toilet Transfer Details (indicate cue type and reason): pt with hyperextended LLE (and pushing to right) and buckling of RLE during transfer making it difficult for therapists to assist patient in knee flexion to take a step toward left to sit in chair. If still pushing next session, may be easier for transfer to Rt. despite buckling of right knee Toilet Transfer Method: Stand pivot Tub/Shower Transfer: Not assessed Ambulation Related to ADLs: unable ADL Comments: Pt with visual agnosia. 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  Bed Mobility Bed Mobility: Yes Rolling Right: 1: +1 Total assist;With rail Rolling Right Details (indicate cue type and reason): Pt would bend L knee on command  and assist with rotating hips but required increased time and hand over hand assist to reach for rail and rotate trunk Right Sidelying to Sit: HOB elevated (comment degrees);Other (comment);1: +1 Total assist (HOB  20degrees) Right Sidelying to Sit Details (indicate cue type and reason): facilitation at hips and trunk, pt =25% Supine to Sit: 1: +2 Total assist (20%) Supine to Sit Details (indicate cue type and reason): pt attempted to initiate moving legs off bed but very stiff hips; facilation to bring knees/hips into flexion and bring off bed as well as elevate trunk Sit to Supine: 1: +2 Total assist (+3totalpt10%) Transfers Transfers: Yes Sit to Stand: 1: +2 Total assist;From bed;From elevated surface;Patient percentage (comment) (pt=35%) Sit to Stand Details (indicate cue type and reason): RLE blocked pt buckles in standing without support. Decreased trunk extension in standing today but not pushing R. 2 trials from bed with max cueing and facilitation Stand to Sit: 1: +2 Total assist;To bed;To chair/3-in-1;Patient percentage (comment) (pt=30%) Stand to Sit Details: assist for control of descent  Stand Pivot Transfers: 1: +2 Total assist;Patient percentage (comment) (pt=35%) Stand Pivot Transfer Details (indicate cue type and reason): pt leaning into the transfer today instead of pushing away from it, decreased trunk extension and assisting with initiation of moving LLE toward chair Ambulation/Gait Ambulation/Gait: No Stairs: No Posture/Postural Control Posture/Postural Control: No significant limitations Balance Balance Assessed: Yes Static Sitting Balance Static Sitting - Balance Support: Left upper extremity supported;Feet supported Static Sitting - Level of Assistance: 5: Stand by assistance;4: Min assist Static Sitting - Comment/# of Minutes: 6 min, tactile cueing when pt begain to lose balance to left or right, mostly minguard assist for balance today only brief periods of min assist Static Standing Balance Static Standing - Balance Support: Bilateral upper extremity supported Static Standing - Level of Assistance: 1: +2 Total assist;Patient percentage (comment) Static Standing - Comment/#  of Minutes: pt=35% with R knee blocked, facilitation for trunk and hip extension  Home Assistive Devices/Equipment:  Home Assistive Devices/Equipment Home Assistive Devices/Equipment: Eyeglasses  Discharge Planning:  Living Arrangements: Spouse/significant other Support Systems: Spouse/significant other Do you have any problems obtaining your medications?: No Type of Residence: Private residence Home Care Services: No Patient expects to be discharged to:: rehab Case Management Consult Needed: Yes (Comment)  Current Functional Levels:  Bladder Continence: Currently has foley catheter  Previous Home Environment:  Living Arrangements: Spouse/significant other Support Systems: Spouse/significant other Do you have any problems obtaining your medications?: No Type of Residence: Private residence Home Care Services: No Patient expects to be discharged to:: rehab  Discharge Living Setting:  Plans for Discharge Living Setting: Patient's home;House;Lives with (comment) (Lives with wife) Discharge Living Setting Number of Levels: Tri-level Discharge Living Setting Number of Steps: 1 large front stoop step (4-5 steps at alternate entrance) Discharge Living Setting is Bedroom on Main Floor?: No (Could use music room for a bedroom on main level if needed) Discharge Living Setting is Bathroom on Main Floor?: Yes  Social/Family/Support Systems:  Patient Roles: Spouse;Parent Contact Information: Clover Mealy - wife and Loetta Rough - Dtr Anticipated Caregiver: Clover Mealy - wife Anticipated Caregiver's Contact Information: Erskine Squibb [phone] Marcelino Duster - Dtr [phone]) Ability/Limitations of Caregiver: Wife can assist Caregiver Availability: 24/7 Discharge Plan Discussed with Primary Caregiver: Yes Is Caregiver In Agreement with Plan?: Yes Does Caregiver/Family have Issues with Lodging/Transportation while Pt is in Rehab?: No  Goals/Additional Needs:  Patient/Family Goal for Rehab: PT min  A/OT and ST min to mod A goals (  ELOS =3 plus weeks) Cultural Considerations: Presb Dietary Needs: Dysphagia 1, nectar thick liquids Equipment Needs: TBD Pt/Family Agrees to Admission and willing to participate: Yes Program Orientation Provided & Reviewed with Pt/Caregiver Including Roles  & Responsibilities: Yes  Preadmission Screen Completed By:  Trish Mage, 12/14/2011 1:55 PM  Patient's condition:  Please see physician update to information in consult dated 12/11/11.  Preadmission Screen Competed [id number] Koleen Distance, RN, Time/Date,1409/12/13/13.  Discussed status with Dr. Riley Kill on 12/14/11 at 1411 (time/date) and received telephone approval for admission today.  Admission Coordinator:  Trish Mage, time1411/Date02/18/13

## 2011-12-15 DIAGNOSIS — Z5189 Encounter for other specified aftercare: Secondary | ICD-10-CM

## 2011-12-15 DIAGNOSIS — I639 Cerebral infarction, unspecified: Secondary | ICD-10-CM | POA: Diagnosis present

## 2011-12-15 DIAGNOSIS — I6330 Cerebral infarction due to thrombosis of unspecified cerebral artery: Secondary | ICD-10-CM

## 2011-12-15 DIAGNOSIS — G8110 Spastic hemiplegia affecting unspecified side: Secondary | ICD-10-CM

## 2011-12-15 LAB — DIFFERENTIAL
Eosinophils Absolute: 0.7 10*3/uL (ref 0.0–0.7)
Lymphs Abs: 1.6 10*3/uL (ref 0.7–4.0)
Neutro Abs: 9.5 10*3/uL — ABNORMAL HIGH (ref 1.7–7.7)
Neutrophils Relative %: 73 % (ref 43–77)

## 2011-12-15 LAB — GLUCOSE, CAPILLARY
Glucose-Capillary: 197 mg/dL — ABNORMAL HIGH (ref 70–99)
Glucose-Capillary: 184 mg/dL — ABNORMAL HIGH (ref 70–99)

## 2011-12-15 LAB — CBC
HCT: 36.9 % — ABNORMAL LOW (ref 39.0–52.0)
Hemoglobin: 12.1 g/dL — ABNORMAL LOW (ref 13.0–17.0)
MCH: 28.8 pg (ref 26.0–34.0)
MCHC: 32.8 g/dL (ref 30.0–36.0)
MCV: 87.9 fL (ref 78.0–100.0)
RDW: 13.8 % (ref 11.5–15.5)

## 2011-12-15 LAB — URINE CULTURE
Culture  Setup Time: 201302182359
Culture: NO GROWTH

## 2011-12-15 LAB — CARDIOLIPIN ANTIBODIES, IGG, IGM, IGA
Anticardiolipin IgA: 6 APL U/mL — ABNORMAL LOW (ref <22)
Anticardiolipin IgG: 6 GPL U/mL — ABNORMAL LOW (ref <23)
Anticardiolipin IgM: 4 MPL U/mL — ABNORMAL LOW (ref <11)

## 2011-12-15 LAB — COMPREHENSIVE METABOLIC PANEL
Albumin: 2.9 g/dL — ABNORMAL LOW (ref 3.5–5.2)
Alkaline Phosphatase: 44 U/L (ref 39–117)
BUN: 16 mg/dL (ref 6–23)
Calcium: 8.9 mg/dL (ref 8.4–10.5)
Creatinine, Ser: 0.65 mg/dL (ref 0.50–1.35)
GFR calc Af Amer: >90 mL/min (ref >90)
Glucose, Bld: 201 mg/dL — ABNORMAL HIGH (ref 70–99)
Potassium: 3.9 mEq/L (ref 3.5–5.1)
Total Protein: 6.6 g/dL (ref 6.0–8.3)

## 2011-12-15 MED ORDER — ENOXAPARIN SODIUM 40 MG/0.4ML ~~LOC~~ SOLN
40.0000 mg | SUBCUTANEOUS | Status: DC
  Administered 2011-12-15 – 2011-12-24 (×10): 40 mg via SUBCUTANEOUS
  Filled 2011-12-15 (×11): qty 0.4

## 2011-12-15 MED ORDER — PRO-STAT SUGAR FREE PO LIQD
30.0000 mL | Freq: Every day | ORAL | Status: DC
  Administered 2011-12-15 – 2011-12-22 (×7): 30 mL via ORAL
  Filled 2011-12-15 (×9): qty 30

## 2011-12-15 MED ORDER — ENSURE PUDDING PO PUDG
1.0000 | Freq: Two times a day (BID) | ORAL | Status: DC
  Administered 2011-12-15 – 2011-12-23 (×13): 1 via ORAL

## 2011-12-15 NOTE — Progress Notes (Signed)
Patient information reviewed and entered into [facility] system by Kyilee Gregg, RN, CRRN, PPS Coordinator.  Information including medical coding and functional independence measure will be reviewed and updated through discharge.     Per nursing patient was given "Data Collection Information Summary for Patients in Inpatient Rehabilitation Facilities with attached "Privacy Act Statement-Health Care Records" upon admission.   

## 2011-12-15 NOTE — Progress Notes (Signed)
Physical Therapy Session Note  Patient Details  Name: Darrell [name] MRN: [id number] Date of Birth: 09-22-1946  Today's Date: 12/15/2011 Time: 1300-1328 Time Calculation (min): 28 min  Short Term Goals: Week 1:   see navigator  Skilled Therapeutic Interventions/Progress Updates:  Ambulation/gait training;Balance/vestibular training;Cognitive remediation/compensation;DME/adaptive equipment instruction;Community reintegration;Functional mobility training;Neuromuscular re-education;Patient/family education;Therapeutic Exercise;Therapeutic Activities;Stair training;Splinting/orthotics;UE/LE Strength taining/ROM;UE/LE Coordination activities;Visual/perceptual remediation/compensation;Wheelchair propulsion/positioning   Therapy Documentation Precautions:  Precautions Precautions: Fall Precaution Comments: motor apraxia, right hemiplegia, delayed initiation, expressive aphasia Required Braces or Orthoses: No Restrictions Weight Bearing Restrictions: No Other Position/Activity Restrictions: monitor O2 during mobility Pain: Pain Assessment Pain Assessment: Faces Pain Score:  (grimacing ) Faces Pain Scale: Hurts whole lot Pain Type: Acute pain Pain Location: Abdomen Pain Orientation: Left Pain Descriptors: Other (Comment) (unable to describe) Pain Frequency: Intermittent Pain Onset: Unable to tell Patients Stated Pain Goal: Other (Comment) (unable to state) Pain Intervention(s): Other (Comment) (RN present and aware) Multiple Pain Sites: No Other Treatments: Treatments Therapeutic Activity: Patient very lethargic and crying out in pain; c/o L abdomen pain.  Patient in recliner and agreed to transfer back to bed.  With use of slideboard and +2 A patient performed slideboard recliner > bed with total A and total verbal cues for sequence for lateral and anterior leans to get board in place and to maintain anterior lean on therapist to slide across bed; patient able to perform lateral lean  on bed to remove board with mod-max A; required +2A to bring LE into bed; patient able to bridge x 2 with verbal cues and extra time to respond to reposition draw bad and hips in bed; patient positioned with pillows under LE and RUE for pressure relief and support.    See FIM for current functional status  Therapy/Group: Individual Therapy  Edman Circle [facility] 12/15/2011, 1:56 PM

## 2011-12-15 NOTE — Evaluation (Signed)
Physical Therapy Assessment and Plan  Patient Details  Name: Darrell [name] MRN: [id number] Date of Birth: 1946-05-07  PT Diagnosis: Abnormal posture, Abnormality of gait, Cognitive deficits, Coordination disorder, Hemiplegia dominant, Hypertonia, Impaired sensation and Muscle weakness Rehab Potential: Good ELOS: 4 weeks   Today's Date: 12/15/2011 Time: 4098-1191 Time Calculation (min): 65 min  Problem List:  Patient Active Problem List  Diagnoses  . DIABETES MELLITUS, TYPE II  . HYPERLIPIDEMIA  . SLEEP APNEA, OBSTRUCTIVE, SEVERE  . HYPERTENSION  . VIRAL URI  . ASTHMA, ACUTE  . AUTONOMIC NEUROPATHY, DIABETIC  . Left carotid bruit  . Carotid artery plaque  . Occlusion and stenosis of carotid artery without mention of cerebral infarction  . CVA (cerebral infarction)    Past Medical History:  Past Medical History  Diagnosis Date  . Diabetes mellitus   . Sleep apnea     cpap      sleep study > 4 yrs  . Hypertension     dr Tawanna Cooler   Past Surgical History:  Past Surgical History  Procedure Date  . Hernia repair   . Heel spur surgery   . Heer surgery   . Endarterectomy 12/09/2011    Procedure: ENDARTERECTOMY CAROTID;  Surgeon: [name], MD;  Location: [facility] OR;  Service: Vascular;  Laterality: Left;  Left Carotid endarterectomy with dacron patch angioplasty    Assessment & Plan Clinical Impression: Patient is a 66 y.o. male with history of DM, HTN, severe left carotid artery stenosis admitted on 12/09/11 for left CEA. In recovery, patient with inability to move right side. Patient taken back to OR emergently for exploration of left CEA with thrombectomy distal ICA. MRI/MRA brain revealed non hemorrhagic infarcts in left MCA/PCA distrubution and focal infarct in left BG. Patient transferred to CIR on 12/14/2011 for more therapies prior to discharge home.  PTA, patient lived in a two-level home with 5 steps to enter. Bedroom and bathroom are on the second floor. Patient was  modified independent with all mobility with use of SPC, was working full time, and driving. Patient owns no other DME at this time.  Patient currently requires total assist + 2 with mobility secondary to generalized uscle weakness, dominant right-sided hemiplegia, impaired timing and sequencing of muscle activation, increased right LE tone with two beat clonus at right ankle, significant motor apraxia with significantly decreased motor planning, decreased midline orientation, decreased attention to right, significantly delayed initiation of movement, delayed processing time with expressive > receptive aphasia, decreased sitting balance with frequent LOB right, decreased standing balance, decreased postural control, absent balance strategies, and overall decreased activity tolerance resulting in decreased functional independence.    Patient will benefit from skilled PT intervention to maximize safe functional mobility, minimize fall risk and decrease caregiver burden for planned discharge home with 24 hour assist.  Anticipate patient will benefit from follow up [facility] at discharge.  PT - End of Session Activity Tolerance: Tolerates 30+ min activity with multiple rests PT Assessment Rehab Potential: Good Barriers to Discharge: Inaccessible home environment;Decreased caregiver support PT Plan PT Frequency: 2-3 X/day, 60-90 minutes Estimated Length of Stay: 4 weeks PT Treatment/Interventions: Ambulation/gait training;Balance/vestibular training;Cognitive remediation/compensation;DME/adaptive equipment instruction;Community reintegration;Functional mobility training;Neuromuscular re-education;Patient/family education;Therapeutic Exercise;Therapeutic Activities;Stair training;Splinting/orthotics;UE/LE Strength taining/ROM;UE/LE Coordination activities;Visual/perceptual remediation/compensation;Wheelchair propulsion/positioning PT Recommendation Follow Up Recommendations: Home health PT Equipment Recommended:  Wheelchair cushion (measurements);Wheelchair (measurements) Equipment Details: 20x18 W/C   PT Evaluation Precautions/Restrictions Precautions Precautions: Fall Precaution Comments: motor apraxia, right hemiplegia, delayed initiation, expressive aphasia Required Braces or Orthoses: No  Restrictions Weight Bearing Restrictions: No Other Position/Activity Restrictions: monitor O2 during mobility General Chart reviewed, no family present Vital Signs Oxygen Therapy SpO2: 95 % O2 Device: None (Room air) Pulse Oximetry Type: Intermittent Pain Pain Assessment Pain Assessment: No/denies pain Pain Score: 0-No pain Home Living/Prior Functioning Home Living Lives With: Alone (per patient with use of yes/no questions at PT eval) Receives Help From: Family Type of Home: House Home Layout: Two level;Bed/bath upstairs Alternate Level Stairs-Rails: Right;Left;Can reach both Alternate Level Stairs-Number of Steps: full flight Home Access: Stairs to enter Entrance Stairs-Rails: Right;Left;Can reach both Entrance Stairs-Number of Steps: 5 Bathroom Shower/Tub: Forensic scientist: Standard Bathroom Accessibility: Yes How Accessible: Accessible via walker Home Adaptive Equipment: Straight cane Prior Function Level of Independence: Independent with basic ADLs;Independent with homemaking with ambulation;Independent with gait;Independent with transfers Able to Take Stairs?: Yes Driving: Yes Vocation: Full time employment Vision/Perception   Decreased attention to right side of body and space Cognition Arousal/Alertness: Awake/alert Sensation Sensation Light Touch: Impaired Detail Light Touch Impaired Details: Impaired RLE Stereognosis: Not tested Hot/Cold: Not tested Proprioception: Impaired Detail Proprioception Impaired Details: Impaired RLE Additional Comments: ? sensory deficits vs. right inattention or both. Hard to fully assess due to expressive  aphasia. Coordination Gross Motor Movements are Fluid and Coordinated: No Fine Motor Movements are Fluid and Coordinated: No Coordination and Movement Description: decreased grading and accuracy of bilat UEs and LEs Heel Shin Test: unable to assess due to inadequate right LE AROM and motor apraxia Motor  Motor Motor: Hemiplegia;Abnormal postural alignment and control;Abnormal tone;Motor apraxia Motor - Skilled Clinical Observations: increased right LE tone with two beat clonus at right ankle. Significantly delayed initiation.  Mobility Bed Mobility Bed Mobility: Yes Rolling Right: 1: +2 Total assist Rolling Right Details (indicate cue type and reason): Significantly delayed initiation of active movement. When movement is initiated, not always functional/purposeful due to significant motor apraxia. Right Sidelying to Sit: 1: +2 Total assist Right Sidelying to Sit Details (indicate cue type and reason): Significantly delayed initiation of active movement. When movement is initiated, not always functional/purposeful due to significant motor apraxia. Sitting - Scoot to Edge of Bed: 1: +1 Total assist Sitting - Scoot to Edge of Bed Details (indicate cue type and reason): trunk collapses to right with absent righting reactions. Transfers Transfers: Yes Lateral/Scoot Transfers: 1: +2 Total assist;From elevated surface;Other (comment) (patient = < 25%) Lateral/Scoot Transfer Details (indicate cue type and reason): Significantly delayed initiation of active movement. When movement is initiated, not always functional/purposeful due to significant motor apraxia. Locomotion  Ambulation Ambulation: No (unable to achieve standing at time of PT eval) Stairs / Additional Locomotion Stairs: No (unsafe to attempt at this time) Corporate treasurer: Yes Wheelchair Assistance: 1: +1 Total assist Wheelchair Propulsion: Left upper extremity;Left lower extremity Wheelchair Parts  Management: Needs assistance Distance: 3 feet. Unable to coordinate new, complex task due to significant motor apraxia.  Trunk/Postural Assessment  Cervical Assessment Cervical Assessment: Exceptions to [facility] (rotated left) Thoracic Assessment Thoracic Assessment: Exceptions to [facility] (collapsed to right with decreased active elongation) Lumbar Assessment Lumbar Assessment: Exceptions to [facility] (posterior pelvic tilt with increased weight bearing right) Postural Control Postural Control: Deficits on evaluation Trunk Control: collapses right, unable to sustain unsuported sitting without mod-total assist Righting Reactions: absent Protective Responses: absent  Balance Balance Balance Assessed: Yes Static Sitting Balance Static Sitting - Balance Support: Feet supported;Bilateral upper extremity supported Static Sitting - Level of Assistance: 3: Mod assist;2: Max assist;1: +1 Total assist  Static Sitting - Comment/# of Minutes: level fluctuates, poor sustained trunk control Dynamic Sitting Balance Dynamic Sitting - Balance Support: Bilateral upper extremity supported;Feet supported (weight shifts in all planes with small left UE reaching) Dynamic Sitting - Level of Assistance: 1: +1 Total assist Extremity Assessment      RLE Assessment RLE Assessment: Exceptions to WFL (PROM WNL, strength grossly 1-2/5.) RLE Strength RLE Overall Strength Comments: Patient able to spontaneously move right LE in supine position, however due to apraxia unable to consistiently initiate functionally LLE Assessment LLE Assessment: Within Functional Limits (AROM WNL. Strength grossly 4/5.)  Treatment Initiated During Session: Static sitting balance EOB, facilitation for right trunk elongation with focus on midline orientation. Patient collapses to the right, balance reactions absent at this time. Patient with decreased sustained attention to task and significant motor apraxia resulting in varied levels of assist  sitting EOB, from mod-total assist. Seated weight shifts with intermittent left UE reaching to increase active trunk elongation max-total assist. Lateral scoot transfer to W/C total assist +2, patient = < 25%. Delayed initiation of movement, and when movement is initiated it is not always functional/purposeful in the moment due to motor apraxia.  See FIM for current functional status Refer to Care Plan for Long Term Goals  Recommendations for other services: None  Discharge Criteria: Patient will be discharged from PT if patient refuses treatment 3 consecutive times without medical reason, if treatment goals not met, if there is a change in medical status, if patient makes no progress towards goals or if patient is discharged from hospital.  The above assessment, treatment plan, treatment alternatives and goals were discussed and mutually agreed upon: by patient  Romeo Rabon 12/15/2011, 9:59 AM

## 2011-12-15 NOTE — Progress Notes (Signed)
Social Work Assessment and Plan Assessment and Plan  Patient Name: Darrell [name]  JXBJY'N Date: 12/15/2011  Problem List:  Patient Active Problem List  Diagnoses  . DIABETES MELLITUS, TYPE II  . HYPERLIPIDEMIA  . SLEEP APNEA, OBSTRUCTIVE, SEVERE  . HYPERTENSION  . VIRAL URI  . ASTHMA, ACUTE  . AUTONOMIC NEUROPATHY, DIABETIC  . Left carotid bruit  . Carotid artery plaque  . Occlusion and stenosis of carotid artery without mention of cerebral infarction  . CVA (cerebral infarction)    Past Medical History:  Past Medical History  Diagnosis Date  . Diabetes mellitus   . Sleep apnea     cpap      sleep study > 4 yrs  . Hypertension     dr Tawanna Cooler    Past Surgical History:  Past Surgical History  Procedure Date  . Hernia repair   . Heel spur surgery   . Heer surgery   . Endarterectomy 12/09/2011    Procedure: ENDARTERECTOMY CAROTID;  Surgeon: [name], MD;  Location: [facility] OR;  Service: Vascular;  Laterality: Left;  Left Carotid endarterectomy with dacron patch angioplasty    Discharge Planning  Discharge Planning Support Systems: Spouse/significant other;Children;Church/faith community;Friends/neighbors Assistance Needed: WIll require assistance at discharge Patient expects to be discharged to:: Home versus NHP  Social/Family/Support Systems Social/Family/Support Systems Patient Roles: Spouse;Parent;Other (Comment) (Employee-Self Employed Insurance)  Employment Status Employment Status Employment Status: Employed 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  She hopes he does well here and she is still processing all that has happened  since the elective CEA surgery. Recent Psychosocial Issues: Other medical issues Pyschiatric History: No history of depression according to wife, but due to all that has happened MD feels pt is depressed and has started him on a antidepressant.  Will try to evaluate once pt is able to communicate-pt has speech issues due to his multiple CVA's.  Patient/Family Perceptions, Expectations & Goals Pt/Family Perceptions, Expectations and Goals Pt/Family understanding of illness & functional limitations: Wife has a basic understanding of husband's stroke and deficits.  She is still processing this information and talks daily with MD's to find out updated information.  Discussed pt will require care at discharge and to begin to prepare for this. Premorbid pt/family roles/activities: Husband, Father, grandfather, Self employed, Mining engineer, Chief Financial Officer, etc Anticipated changes in roles/activities/participation: Plans to resume these roles Pt/family expectations/goals: Wife states: " I want him to do as well as he can, I want to beable to take him home from here.  I am trying to find out the insurance he has and the coverage now to prepare for home."  [employer] Agencies: None Premorbid Home Care/DME Agencies: None Transportation available at discharge: Family Resource referrals recommended: Support group (specify) (CVA Support Group)  Discharge Visual merchandiser Resources: Medicare;Private Insurance (specify) Transport planner) Financial Resources: Employment;Social Theatre manager Screen Referred: No Living Expenses: Lives with family Money Management: Patient;Spouse Home Management: Wife Patient/Family Preliminary Plans: Return home with wife assisting if she is able.  Discussed pt will require 24 hour physical care at discharge and to begin to plan for this.  Ask daughter's if they can assist or friends, look into insurance  coverage  and possible long term care insurance if pt has it.  wife looking into the insurance now.  Clinical Impression:  Pt has had a severe CVA with numerous deficits.  He will require much physical care at discharge, unsure if wife can provide this.  Have begun to discuss alternative options with need to  Go slow with still processing all the events that have happened in such a short time.  Wife to look into insurance and talk with their daughters.      Lucy Chris 12/15/2011

## 2011-12-15 NOTE — Progress Notes (Signed)
[facility]patient [facility] Individual Statement of Services  Patient Name:  Darrell [name]  Date:  12/15/2011  Welcome to the [facility]patient [facility].  Our goal is to provide you with an individualized program based on your diagnosis and situation, designed to meet your specific needs.  With this comprehensive rehabilitation program, you will be expected to participate in at least 3 hours of rehabilitation therapies Monday-Friday, with modified therapy programming on the weekends.  Your rehabilitation program will include the following services:  Physical Therapy (PT), Occupational Therapy (OT), Speech Therapy (ST), 24 hour per day rehabilitation nursing, Therapeutic Recreaction (TR), Case Management (RN and Child psychotherapist), Rehabilitation Medicine, Nutrition Services and Pharmacy Services  Weekly team conferences will be held on Wednesdays to discuss your progress.  Your RN Case Designer, television/film set will talk with you frequently to get your input and to update you on team discussions.  Team conferences with you and your family in attendance may also be held.  Expected length of stay: 4 weeks  Overall anticipated outcome: minimum - moderate assistance at wheelchair level  Depending on your progress and recovery, your program may change.  Your RN Case Estate agent will coordinate services and will keep you informed of any changes.  Your RN Sports coach and SW names and contact numbers are listed  below.  The following services may also be recommended but are not provided by the Inpatient Rehabilitation Center:   Driving Evaluations  Home Health Rehabiltiation Services  Outpatient Rehabilitatation [facility]  Vocational Rehabilitation   Arrangements will be made to provide these services after discharge if needed.  Arrangements include referral to agencies that provide these services.  Your insurance has been verified to be:  Medicare and Community education officer Your primary  doctor is:  Dr Governor Specking  Pertinent information will be shared with your doctor and your insurance company.  Case Manager: Lutricia Horsfall, [facility] [phone]  Social Worker:  Dossie Der, [place] [phone]  Information discussed with and copy given to patient by: Meryl Dare, 12/15/2011

## 2011-12-15 NOTE — Progress Notes (Signed)
INITIAL ADULT NUTRITION ASSESSMENT Date: 12/15/2011   Time: 11:49 AM  Reason for Assessment: Dysphagia  ASSESSMENT: Male 66 y.o.  Dx: CVA (cerebral infarction)  Hx:  Past Medical History  Diagnosis Date  . Diabetes mellitus   . Sleep apnea     cpap      sleep study > 4 yrs  . Hypertension     dr Tawanna Cooler   Related Meds:     . sodium chloride   Intravenous Q24H  . antiseptic oral rinse  15 mL Mouth Rinse q12n4p  . aspirin  325 mg Oral Daily  . chlorhexidine  15 mL Mouth Rinse BID  . citalopram  10 mg Oral Daily  . enoxaparin (LOVENOX) injection  40 mg Subcutaneous Q24H  . insulin aspart  0-15 Units Subcutaneous TID WC  . insulin aspart  0-5 Units Subcutaneous QHS  . lisinopril  5 mg Oral Daily  . metFORMIN  850 mg Oral BID WC   Ht:  177.8 cm, 5\' 10"   Wt: 235 lb 14.4 oz (107.004 kg)  Ideal Wt:    75.5 kg % Ideal Wt: 142%  Usual Wt: 113.6 kg (250 lb) several months ago per family % Usual Wt: 94%  BMI is 33.8. Meets criteria for Obesity Class I.  Food/Nutrition Related Hx: poor nutrition choices PTA (junk food), per family. Intentionally trying to lose weight.  Labs:  CMP     Component Value Date/Time   NA 136 12/15/2011 0650   K 3.9 12/15/2011 0650   CL 102 12/15/2011 0650   CO2 23 12/15/2011 0650   GLUCOSE 201* 12/15/2011 0650   GLUCOSE 141* 10/05/2006 0904   BUN 16 12/15/2011 0650   CREATININE 0.65 12/15/2011 0650   CALCIUM 8.9 12/15/2011 0650   PROT 6.6 12/15/2011 0650   ALBUMIN 2.9* 12/15/2011 0650   AST 17 12/15/2011 0650   ALT 22 12/15/2011 0650   ALKPHOS 44 12/15/2011 0650   BILITOT 0.6 12/15/2011 0650   GFRNONAA >90 12/15/2011 0650   GFRAA >90 12/15/2011 0650   CBG (last 3)   Basename 12/15/11 0731 12/14/11 2326 12/14/11 1624  GLUCAP 197* 190* 246*   Lab Results  Component Value Date   HGBA1C 7.5* 12/09/2011   Intake/Output: I/O last 3 completed shifts: In: -  Out: 300 [Urine:300] Total I/O In: 120 [P.O.:120] Out: -   Diet Order: Dysphagia 1 with  Nectar Liquids  Supplements/Tube Feeding: none  IVF:    Estimated Nutritional Needs:   Kcal:  1860 - 2000 kcal Protein: 90 - 105 grams protein Fluid:  1.9 - 2.1 L/d  Pt followed by RD during acute hospitalization. MBSS on 2/15; diet initiated of dysphagia 1 diet with nectar liquids. Intake is 30% of meals.  This RD spoke with pt and family member about PO intake. Family member reports that pt does not follow a DM diet even though he has been instructed to do so by several healthcare providers. Pt has been intentionally trying to lose weight, and was up to 300 lb at one point, per family. Current intake poor 2/2 patient's PO preferences. Pt does not like pureed consistency foods. Agreeable to pudding.  NUTRITION DIAGNOSIS: -Inadequate oral intake (NI-2.1).  Status: Ongoing  RELATED TO: food choice and texture preferences  AS EVIDENCE BY: pt report.  MONITORING/EVALUATION(Goals): Goal: Pt to consume >/= 90% of estimated needs. Monitor: PO intake, weights, labs, I/O's  EDUCATION NEEDS: -Education not appropriate at this time  INTERVENTION: 1. Nutrition Ambassador to honor pt's food  preferences 2. Ensure Pudding PO BID 3. 30 ml Prostat PO daily, mixed with applesauce or pureed foods to obtain appropriate consistency 4. Offer Magic Cup PO BID 5. RD to follow nutrition care plan  Dietitian #: [phone]  DOCUMENTATION CODES Per approved criteria  -Obesity Unspecified    Adair Laundry 12/15/2011, 11:49 AM

## 2011-12-15 NOTE — Evaluation (Signed)
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 DIABETES MELLITUS, TYPE II  . HYPERLIPIDEMIA  . SLEEP APNEA, OBSTRUCTIVE, SEVERE  . HYPERTENSION  . VIRAL URI  . ASTHMA, ACUTE  . AUTONOMIC NEUROPATHY, DIABETIC  . Left carotid bruit  . Carotid artery plaque  . Occlusion and stenosis of carotid artery without mention of cerebral infarction  . CVA (cerebral infarction)    Past Medical History:  Past Medical History  Diagnosis Date  . Diabetes mellitus   . Sleep apnea     cpap      sleep study > 4 yrs  . Hypertension     dr Tawanna Cooler   Past Surgical History:  Past Surgical History  Procedure Date  . Hernia repair   . Heel spur surgery   . Heer surgery   . Endarterectomy 12/09/2011    Procedure: ENDARTERECTOMY CAROTID;  Surgeon: [name], MD;  Location: [facility] OR;  Service: Vascular;  Laterality: Left;  Left Carotid endarterectomy with dacron patch angioplasty    Assessment & Plan Clinical Impression: Patient is a 66 y.o. year old male with history of DM, HTN, severe L-CAS admitted on 02/13 for L-CEA by Dr. Hart [place]. In recovery, patient with decreased LOC and inability to move right side. Patient taken back to OR emergently for exploration of L-CEA with thrombectomy of meniscectomized surface and distal ICA. Neurology and Hem/Onc consulted for input. 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 Hypercoagulation workup in progress noted to have positive lupus anticoagulant but not reliable due to  proximity of treatment with heparin. Patient on ASA per neurology recommendations. Expressive communication improving. Patient able to complete one step commands with moderate verbal cues. Continues with left gaze preference with right hemiparesis and sensory deficits right side.  Patient transferred to CIR on 12/14/2011 .    Patient currently requires total with basic self-care skills secondary to impaired timing and sequencing, unbalanced muscle activation, motor apraxia, decreased coordination and decreased motor planning and decreased visual perceptual skills.  Prior to hospitalization, patient could complete basic self-cares independently.  Patient will benefit from skilled intervention to decrease level of assist with basic self-care skills prior to discharge home with care partner.  Anticipate patient will require minimal physical assistance and follow up home health.  OT - End of Session Activity Tolerance: Tolerates 30+ min activity with multiple rests OT Assessment Rehab Potential: Good OT Plan OT Frequency: 1-2 X/day, 60-90 minutes Estimated Length of Stay: 4 weeks OT Treatment/Interventions: Balance/vestibular training;DME/adaptive equipment instruction;Functional mobility training;Neuromuscular re-education;Patient/family education;Self Care/advanced ADL retraining;Therapeutic Activities;Therapeutic Exercise;UE/LE Strength taining/ROM;UE/LE Coordination activities;Visual/perceptual remediation/compensation OT Recommendation Follow Up Recommendations: Home health OT;24 hour supervision/assistance Equipment Recommended: 3 in 1 bedside comode;Tub/shower seat  OT Evaluation Precautions/Restrictions  Precautions Precautions: Fall Precaution Comments: motor apraxia, right hemiplegia, delayed initiation, expressive aphasia Required Braces or Orthoses: No Restrictions Weight Bearing Restrictions: No Other Position/Activity Restrictions: monitor O2 during  mobility General Family/Caregiver Present: Yes (wife, Erskine Squibb) Vital Signs Oxygen Therapy SpO2: 95 % O2 Device: None (Room air) Pulse Oximetry Type: Intermittent Pain Pain Assessment Pain Assessment: No/denies pain Pain Score:  (  unable to state) Pain Type: Acute pain Pain Location: Leg Pain Orientation: Right;Left Pain Descriptors: Other (Comment) (unable to describe) Pain Onset: Unable to tell Patients Stated Pain Goal: Other (Comment) (unable to state) Pain Intervention(s): RN made aware Multiple Pain Sites: No Home Living/Prior Functioning Home Living Lives With: Spouse Receives Help From: Family Type of Home: House Home Layout: Two level;Full bath on main level;Other (Comment) (wife states can have bedroom on main level) Alternate Level Stairs-Rails: Right;Left;Can reach both Alternate Level Stairs-Number of Steps: full flight Home Access: Stairs to enter Entrance Stairs-Rails: Right;Left;Can reach both Entrance Stairs-Number of Steps: 5 Bathroom Shower/Tub: Health visitor: Standard Bathroom Accessibility: Yes How Accessible: Accessible via walker Home Adaptive Equipment: None IADL History Education: college Prior Function Level of Independence: Independent with basic ADLs;Independent with homemaking with ambulation;Independent with gait;Independent with transfers Able to Take Stairs?: Yes Driving: Yes Vocation: Full time employment Financial planner) ADL ADL Grooming: Maximal assistance Where Assessed-Grooming: Bed level Upper Body Bathing: Maximal assistance Where Assessed-Upper Body Bathing: Bed level Lower Body Bathing: Dependent Where Assessed-Lower Body Bathing: Bed level Upper Body Dressing: Unable to assess (no clothing on eval) Lower Body Dressing: Unable to assess Toileting: Not assessed Toilet Transfer: Not assessed Tub/Shower Transfer: Not assessed Film/video editor: Not assessed ADL Comments: pt +2 squat/scoot  pivot transfer to recliner Vision/Perception  Vision - History Baseline Vision: Wears glasses all the time Patient Visual Report: Diplopia Vision - Assessment Additional Comments: Left gaze preference, able to bring eyes to and past midline to Right with mod verbal and tactile cues Perception Perception: Impaired Inattention/Neglect: Does not attend to right side of body Praxis Praxis: Impaired Praxis Impairment Details: Motor planning;Perseveration  Cognition Overall Cognitive Status: Impaired Arousal/Alertness: Lethargic Orientation Level: Disoriented X4 Attention: Focused Focused Attention: Appears intact Sustained Attention: Impaired Sustained Attention Impairment: Verbal basic;Functional basic Memory: Impaired Memory Impairment: Storage deficit;Retrieval deficit;Decreased recall of new information;Decreased long term memory;Decreased short term memory;Prospective memory Decreased Long Term Memory: Verbal basic Decreased Short Term Memory: Verbal basic;Functional basic Awareness: Impaired Awareness Impairment: Intellectual impairment Problem Solving: Impaired Problem Solving Impairment: Verbal basic;Functional basic Executive Function:  (all levels impaired: total assist) Initiating: Impaired Initiating Impairment: Verbal basic;Functional basic Behaviors: Perseveration;Poor frustration tolerance Safety/Judgment: Impaired Comments: All cognitive-communicative skills are impacted by internal distraction related to pain as well as perseverations, echolalia and focused attention level. Sensation Sensation Light Touch: Impaired Detail Light Touch Impaired Details: Impaired RUE;Impaired RLE Stereognosis: Not tested Hot/Cold: Not tested Proprioception: Impaired Detail Proprioception Impaired Details: Impaired RUE;Impaired RLE Additional Comments: ? sensory deficits vs. right inattention or both. Hard to fully assess due to expressive aphasia. Coordination Gross Motor  Movements are Fluid and Coordinated: No Fine Motor Movements are Fluid and Coordinated: No Coordination and Movement Description: decreased grading and accuracy of bilat UEs and LEs Heel Shin Test: unable to assess due to inadequate right LE AROM and motor apraxia Motor  Motor Motor: Hemiplegia;Abnormal postural alignment and control;Abnormal tone;Motor apraxia Motor - Skilled Clinical Observations: increased right LE tone with two beat clonus at right ankle. Significantly delayed initiation. Mobility  Bed Mobility Bed Mobility: Yes Rolling Right: 1: +2 Total assist Rolling Right Details (indicate cue type and reason): Significantly delayed initiation of active movement. When movement is initiated, not always functional/purposeful due to significant motor apraxia. Right Sidelying to Sit: 1: +2 Total assist Right Sidelying to Sit Details (indicate cue type and reason): Significantly delayed initiation of active movement. When movement is initiated, not always functional/purposeful due to  significant motor apraxia. Sitting - Scoot to Edge of Bed: 1: +1 Total assist Sitting - Scoot to Edge of Bed Details (indicate cue type and reason): trunk collapses to right with absent righting reactions. Transfers Transfers: Yes  Trunk/Postural Assessment  Cervical Assessment Cervical Assessment: Exceptions to [facility] (rotated left) Thoracic Assessment Thoracic Assessment: Exceptions to [name] (collapsed to right with decreased active elongation) Lumbar Assessment Lumbar Assessment: Exceptions to [facility] (posterior pelvic tilt with increased weight bearing right) Postural Control Postural Control: Deficits on evaluation Trunk Control: collapses right, unable to sustain unsuported sitting without mod-total assist Righting Reactions: absent Protective Responses: absent  Balance Balance Balance Assessed: Yes Static Sitting Balance Static Sitting - Balance Support: Feet supported;Bilateral upper extremity  supported Static Sitting - Level of Assistance: 3: Mod assist;2: Max assist;1: +1 Total assist Static Sitting - Comment/# of Minutes: level fluctuates, poor sustained trunk control Dynamic Sitting Balance Dynamic Sitting - Balance Support: Bilateral upper extremity supported;Feet supported (weight shifts in all planes with small left UE reaching) Dynamic Sitting - Level of Assistance: 1: +1 Total assist Extremity/Trunk Assessment RUE Assessment RUE Assessment: Exceptions to [facility] RUE Strength RUE Overall Strength Comments: RUE flaccid with no tone noted and decreaed attention to Rt side LUE Assessment LUE Assessment: Within Functional Limits  See FIM for current functional status Refer to Care Plan for Long Term Goals  Recommendations for other services: None  Discharge Criteria: Patient will be discharged from OT if patient refuses treatment 3 consecutive times without medical reason, if treatment goals not met, if there is a change in medical status, if patient makes no progress towards goals or if patient is discharged from hospital.  The above assessment, treatment plan, treatment alternatives and goals were discussed and mutually agreed upon: by patient and by family  Treatment: OT eval and ADL assessment conducted at bed level secondary to increased impairments and physical deficits.  Pt required +2 total assist for supine to sit and squat/scoot pivot to recliner.  Pt with greatly impaired initiation, dominant RUE flaccid, occasional movements in RLE with bed mobility.   [name] 12/15/2011, 12:35 PM

## 2011-12-15 NOTE — Evaluation (Signed)
Speech Language Pathology Assessment and Plan  Patient Details  Name: Darrell [name] MRN: [id number] Date of Birth: 10/18/1946  SLP Diagnosis: Mixed Aphasia, Cognitive Impairment, Dysphagia Rehab Potential: Good ELOS: 3 weeks   Today's Date: 12/15/2011 Time: 1000-1100 Time Calculation (min): 60 min  Problem List:  Patient Active Problem List  Diagnoses  . DIABETES MELLITUS, TYPE II  . HYPERLIPIDEMIA  . SLEEP APNEA, OBSTRUCTIVE, SEVERE  . HYPERTENSION  . VIRAL URI  . ASTHMA, ACUTE  . AUTONOMIC NEUROPATHY, DIABETIC  . Left carotid bruit  . Carotid artery plaque  . Occlusion and stenosis of carotid artery without mention of cerebral infarction  . CVA (cerebral infarction)    Past Medical History:  Past Medical History  Diagnosis Date  . Diabetes mellitus   . Sleep apnea     cpap      sleep study > 4 yrs  . Hypertension     dr Tawanna Cooler   Past Surgical History:  Past Surgical History  Procedure Date  . Hernia repair   . Heel spur surgery   . Heer surgery   . Endarterectomy 12/09/2011    Procedure: ENDARTERECTOMY CAROTID;  Surgeon: [name], MD;  Location: [facility] OR;  Service: Vascular;  Laterality: Left;  Left Carotid endarterectomy with dacron patch angioplasty    Assessment & Plan Clinical Impression: Patient is a 66 y.o. year old male with recent admission to the hospital on 12/09/11 with severe left carotid artery stenosis, s/p left CEA. In recovery, patient with inability to move right side. Patient taken back to OR emergently for exploration of left CEA with thrombectomy distal ICA. MRI/MRA brain revealed non hemorrhagic infarcts in left MCA/PCA distrubution and focal infarct in left BG. Patient transferred to CIR on 12/14/2011  prior to discharge home. Patient presents with a severe cognitive impairment with focused attention and high distractability due to pain.  All other cognitive skills are impacted due to his poor attention.  Evidence of a mixed aphasia as  evidenced by 1 word or automatic short phrase level verbal expression with verbal perseveration and echolalia.  Receptive deficits marked by ability to only follow very basic, automatic 1 step commands, due to poor attention as well.  Patient is unable to consistently express his basic needs at this time.  Patient will benefit from skilled SLP treatment to maximize functional, basic cognitive-communicative skills for return to home with 24 hour supervision.  Education initiated with patient and wife in this evaluation.  SLP - End of Session Patient left: in bed;with call bell in reach;with family/visitor present (Used maxi-lift back to bed for safety) Nurse Communication: Aspiration precautions reviewed;Cognitive/Linguistic strategies reviewed;Diet recommendation Assessment SLP Recommendation/Assessment: Patient will need skilled Speech Language Pathology Services during CIR admission Rehab Potential: Good Barriers to Discharge: Inaccessible home environment;Decreased caregiver support Therapy Diagnosis: Aphasia;Apraxia;Cognitive Impairments Type of Aphasia: Mixed SLP Plan SLP Frequency: 2-3 X/day, 30-60 minutes Estimated Length of Stay: 3 weeks SLP Treatment/Interventions: Cognitive remediation/compensation;Cueing hierarchy;Dysphagia/aspiration precaution training;Environmental controls;Multimodal communication approach;Internal/external aids;Functional tasks;Patient/family education;Speech/Language facilitation;Therapeutic Activities;Therapeutic Exercise Recommendation Follow up Recommendations: Home Health SLP Equipment Recommended: None recommended by SLP  SLP Evaluation Precautions/Restrictions  Precautions Precautions: Fall Precaution Comments: motor apraxia, right hemiplegia, delayed initiation, expressive aphasia Required Braces or Orthoses: No Restrictions Weight Bearing Restrictions: No Other Position/Activity Restrictions: monitor O2 during mobility General  Additional  Pertinent History: 66 y.o. male with history of DM, HTN, severe left carotid artery stenosis admitted on 12/09/11 for left CEA. In recovery, patient with inability to move right side.  Patient taken back to OR emergently for exploration of left CEA with thrombectomy distal ICA. MRI/MRA brain revealed non hemorrhagic infarcts in left MCA/PCA distrubution and focal infarct in left BG. Patient transferred to CIR on 12/14/2011  prior to discharge home. 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) Cognition Overall Cognitive Status: Impaired Arousal/Alertness: Lethargic Orientation Level: Disoriented X4 Attention: Focused Focused Attention: Appears intact Sustained Attention: Impaired Sustained Attention Impairment: Verbal basic;Functional basic Memory: Impaired Memory Impairment: Storage deficit;Retrieval deficit;Decreased recall of new information;Decreased long term memory;Decreased short term memory;Prospective memory Decreased Long Term Memory: Verbal basic Decreased Short Term Memory: Verbal basic;Functional basic Awareness: Impaired Awareness Impairment: Intellectual impairment Problem Solving: Impaired Problem Solving Impairment: Verbal basic;Functional basic Executive Function:  (all levels impaired: total assist) Initiating: Impaired Initiating Impairment: Verbal basic;Functional  basic Behaviors: Perseveration;Poor frustration tolerance Safety/Judgment: Impaired Comments: All cognitive-communicative skills are impacted by internal distraction related to pain as well as perseverations, echolalia and focused attention level. Comprehension Auditory Comprehension Overall Auditory Comprehension: Impaired Yes/No Questions: Impaired Basic Biographical Questions: 26-50% accurate Commands: Impaired One Step Basic Commands: 0-24% accurate Other Conversation Comments: More responsive via facial expressions and laughter when wife present, also more attentive. Interfering Components: Attention;Pain;Processing speed;Working Radio broadcast assistant: Museum/gallery curator: Not tested Common Objects: Unable to indentify Reading Comprehension Reading Status: Not tested Expression Expression Primary Mode of Expression: Verbal Verbal Expression Overall Verbal Expression: Impaired Initiation: Impaired Automatic Speech: Name Level of Generative/Spontaneous Verbalization: Word;Phrase Repetition: No impairment Naming: Impairment Responsive: 0-25% accurate Confrontation: Impaired Common Objects: Unable to indentify Divergent: 0-24% accurate Other Naming Comments: Ability to complete naming tasks is impacted by poor attention and distractions from pain. Verbal Errors: Perseveration;Echolalia;Not aware of errors Pragmatics: Impairment Impairments: Eye contact Interfering Components: Attention Non-Verbal Means of Communication: Not applicable Written Expression Dominant Hand: Right Written Expression: Not tested Oral/Motor Oral Motor/Sensory Function Overall Oral Motor/Sensory Function: Impaired Labial ROM: Reduced right Labial Symmetry: Abnormal symmetry right Lingual ROM: Reduced right Lingual Strength: Reduced Facial ROM: Reduced right Facial Symmetry: Right droop Facial Strength: Reduced Facial Sensation:  Reduced Mandible: Within Functional Limits Motor Speech Overall Motor Speech: Appears within functional limits for tasks assessed  See FIM for current functional status Refer to Care Plan for Long Term Goals  Recommendations for other services: None  Discharge Criteria: Patient will be discharged from SLP if patient refuses treatment 3 consecutive times without medical reason, if treatment goals not met, if there is a change in medical status, if patient makes no progress towards goals or if patient is discharged from hospital.  The above assessment, treatment plan, treatment alternatives and goals were discussed and mutually agreed upon: by family  Darrol Poke 12/15/2011, 11:32 AM   Clinical/Bedside Swallow Evaluation HPI:  NASEER HEARN is a 66 y.o. male who experienced L CVA after carotid endarterectomy. CVA 12/09/11 specific to infarcts in the left anterior basal ganglia and left cerebral infarcts within a watershed distribution both anteriorly and posteriorly. Pt with mixed aphasia and significant oral apraxia.  Participated  in [facility] 2/14, which revealed moderate oropharyngeal dysphagia with poor awareness, weak swallow response, and residuals, prohibiting initiation of diet. A repeat MBSS was completed on 2/16 with initiation of a Dys.1/nectar-thick liquid diet.   Assessment/Recommendations/Treatment Plan   SLP Assessment Clinical Impression Statement: Demonstrates a continued oral-pharyngeal dysphagia, as identified on 2/16 MBSS requiring Puree and Nectar-thick liquid diet.  Patient's declined cognitive status today is a significant interfering factor in his ability to engage or accept many PO trials.  Will continue current diet with strict full supervision and only feed when alert. Risk for Aspiration: Moderate Other Related Risk Factors: History of GERD;Previous CVA;Lethargy;Cognitive impairment  Swallow Evaluation Recommendations Solid Consistency: Dysphagia 1  (Puree) Liquid Consistency: Nectar Liquid Administration via: Cup Medication Administration: Crushed with puree Supervision: Full supervision/cueing for compensatory strategies;Patient able to self feed (needs assist with self-feeding) Compensations: Slow rate;Small sips/bites Postural Changes and/or Swallow Maneuvers: Seated upright 90 degrees;Upright 30-60 min after meal Oral Care Recommendations: Oral care BID;Staff/trained caregiver to provide oral care;Oral care before and after PO Other Recommendations: Order thickener from pharmacy;Prohibited food (jello, ice cream, thin soups);Remove water pitcher Follow up Recommendations: Home health SLP  Treatment Plan Treatment Plan Recommendations: Therapy as outlined in treatment plan below Speech Therapy Frequency: min 6x/week Treatment Duration: 3 weeks Interventions: Aspiration precaution training;Oral motor exercises;Pharyngeal strengthening exercises;Patient/family education;Compensatory techniques;Trials of upgraded texture/liquids;Diet toleration management by SLP;Group dysphagia treatment  Prognosis Prognosis for Safe Diet Advancement: Good Barriers to Reach Goals: Cognitive deficits;Language deficits  Individuals Consulted Consulted and Agree with Results and Recommendations: Patient;Family member/caregiver Family Member Consulted: wife  Swallowing Goals  SLP Swallowing Goals Patient will consume recommended diet without observed clinical signs of aspiration with: Maximum assistance Patient will utilize recommended strategies during swallow to increase swallowing safety with: Maximal cueing  Swallow Study Prior Functional Status  Cognitive/Linguistic Baseline: Within functional limits Type of Home: House Lives With: Spouse Receives Help From: Family Education: college 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)  General  Date of Onset: 12/09/11 Type of Study: Bedside swallow evaluation Diet Prior  to this Study: Dysphagia 1 (puree);Nectar-thick liquids Temperature Spikes Noted: No Respiratory Status: Room air History of Intubation: No Behavior/Cognition: Lethargic;Distractible;Requires cueing;Doesn't follow directions Oral Cavity - Dentition: Adequate natural dentition Patient Positioning: Upright in chair Baseline Vocal Quality: Clear Volitional Cough: Cognitively unable to elicit Volitional Swallow: Unable to elicit  Oral Motor/Sensory Function  Overall Oral Motor/Sensory Function: Impaired Labial ROM: Reduced right Labial Symmetry: Abnormal symmetry right Labial Strength: Reduced Labial Sensation: Reduced Lingual ROM: Reduced right Lingual Strength: Reduced Facial ROM: Reduced right Facial Symmetry: Right droop Facial Strength: Reduced Facial Sensation: Reduced Mandible: Within Functional Limits  Consistency Results  Ice Chips Ice chips: Not tested  Thin Liquid Thin Liquid: Not tested  Nectar Thick Liquid Nectar Thick Liquid: Impaired Presentation: Cup Oral Phase Impairments: Reduced labial seal;Poor awareness of bolus  Honey Thick Liquid Honey Thick Liquid: Not tested  Puree Puree: Not tested  Solid Solid: Not tested   Myra Rude, M.S.,CCC-SLP Pager [phone]  12/15/2011,11:53 AM    .

## 2011-12-15 NOTE — Discharge Instructions (Signed)
Inpatient Rehab Discharge Instructions  [facility] Discharge date and time:    Activities/Precautions/ Functional Status: Activity: {discharge activity:18261} Diet: {diet:18262} Wound Care: {wound care:18263} Functional status:  ___ No restrictions     ___ Walk up steps independently ___ 24/7 supervision/assistance   ___ Walk up steps with assistance ___ Intermittent supervision/assistance  ___ Bathe/dress independently ___ Walk with walker     ___ Bathe/dress with assistance ___ Walk Independently    ___ Shower independently ___ Walk with assistance    ___ Shower with assistance ___ No alcohol     ___ Return to work/school ________  Special Instructions: STROKE/TIA DISCHARGE INSTRUCTIONS SMOKING Cigarette smoking nearly doubles your risk of having a stroke & is the single most alterable risk factor  If you smoke or have smoked in the last 12 months, you are advised to quit smoking for your health.  Most of the excess cardiovascular risk related to smoking disappears within a year of stopping.  Ask you doctor about anti-smoking medications  [place] Quit Line: 1-800-QUIT NOW  Free Smoking Cessation Classes [phone]  CHOLESTEROL Know your levels; limit fat & cholesterol in your diet  Lab Results  Component Value Date   CHOL 116 12/10/2011   HDL 19* 12/10/2011   LDLCALC 51 12/10/2011   LDLDIRECT 52.8 11/01/2008   TRIG 228* 12/10/2011   CHOLHDL 6.1 12/10/2011      Many patients benefit from treatment even if their cholesterol is at goal.  Goal: Total Cholesterol less than 160  Goal:  LDL less than 100  Goal:  HDL greater than 40  Goal:  Triglycerides less than 150  BLOOD PRESSURE American Stroke Association blood pressure target is less that 120/80 mm/Hg  Your discharge blood pressure is:  BP: 133/91 mmHg  Monitor your blood pressure  Limit your salt and alcohol intake  Many individuals will require more than one medication for high blood pressure  DIABETES (A1c  is a blood sugar average for last 3 months) Goal A1c is under 7% (A1c is blood sugar average for last 3 months)  Diabetes: {STROKE DC DIABETES:22357}    Lab Results  Component Value Date   HGBA1C 7.5* 12/09/2011    Your A1c can be lowered with medications, healthy diet, and exercise.  Check your blood sugar as directed by your physician  Call your physician if you experience unexplained or low blood sugars.  PHYSICAL ACTIVITY/REHABILITATION Goal is 30 minutes at least 4 days per week    Activity decreases your risk of heart attack and stroke and makes your heart stronger.  It helps control your weight and blood pressure; helps you relax and can improve your mood.  Participate in a regular exercise program.  Talk with your doctor about the best form of exercise for you (dancing, walking, swimming, cycling).  DIET/WEIGHT Goal is to maintain a healthy weight  Your height is:    Your current weight is: Weight: 107.004 kg (235 lb 14.4 oz) Your body Mass Index (BMI) is:     Following the type of diet specifically designed for you will help prevent another stroke.  Your goal Body Mass Index (BMI) is 19-24.  Healthy food habits can help reduce 3 risk factors for stroke:  High cholesterol, hypertension, and excess weight.        My questions have been answered and I understand these instructions. I will adhere to these goals and the provided educational materials after my discharge from the hospital.  Patient/Caregiver Signature _______________________________ Date  __________  Clinician Signature _______________________________________ Date __________  Please bring this form and your medication list with you to all your follow-up doctor's appointments.

## 2011-12-15 NOTE — Progress Notes (Signed)
Inpatient Diabetes Program Recommendations  AACE/ADA: New Consensus Statement on Inpatient Glycemic Control (2009)  Target Ranges:  Prepandial:   less than 140 mg/dL      Peak postprandial:   less than 180 mg/dL (1-2 hours)      Critically ill patients:  140 - 180 mg/dL   Reason for Visit: Faasting and post-prandial hyperglycemia  Inpatient Diabetes Program Recommendations Insulin - Basal: Please add Lantus 10 to 15 units.  Fasting glucose values are high.   Note: Thank you, Lenor Coffin, RN, CNS, Diabetes Coordinator [phone])

## 2011-12-15 NOTE — Evaluation (Signed)
Recreational Therapy Assessment and Plan  Patient Details  Name: Darrell [name] MRN: [id number] Date of Birth: July 10, 1946  Rehab Potential:   ELOS:     Assessment Clinical Impression:Darrell [name] is an 66 y.o. 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 Hart [place]. In recovery, patient with decreased LOC and inability to move right side. Patient taken back to OR emergently for exploration of L-CEA with thrombectomy of meniscectomized surface and distal ICA. Neurology and Hem/Onc consulted for input. 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 Hypercoagulation workup in progress noted to have positive lupus anticoagulant but not reliable due to proximity of treatment with heparin. Patient on ASA per neurology recommendations. PT/ST evaluations done yesterday. MBS done On 02/15 and diet initiated this weekend- DI, nectar liquids. Expressive communication improving. Patient able to complete one step commands with moderate verbal cues. Continues with left gaze preference with right hemiparesis and sensory deficits right side.  Pt on HOLD for TR services at this time.  Will continue to monitor.  Darrell [name] 12/15/2011, 8:36 AM

## 2011-12-15 NOTE — Progress Notes (Signed)
Patient ID: VICTORINO FATZINGER, male   DOB: February 05, 1946, 66 y.o.   MRN: [id number] Subjective/Complaints: No c/o s.  Up for foley removal. Review of Systems  Neurological: Positive for focal weakness.  Psychiatric/Behavioral: Positive for depression.       Adjustment d/o  All other systems reviewed and are negative.    Objective: Vital Signs: Blood pressure 133/91, pulse 91, temperature 98.4 F (36.9 C), temperature source Oral, resp. rate 19, weight 107.004 kg (235 lb 14.4 oz), SpO2 91.00%. No results found. Results for orders placed during the hospital encounter of 12/14/11 (from the past 72 hour(s))  GLUCOSE, CAPILLARY     Status: Abnormal   Collection Time   12/14/11 11:26 PM      Component Value Range Comment   Glucose-Capillary 190 (*) 70 - 99 (mg/dL)    Comment 1 Notify RN     CBC     Status: Abnormal   Collection Time   12/15/11  6:50 AM      Component Value Range Comment   WBC 13.1 (*) 4.0 - 10.5 (K/uL)    RBC 4.20 (*) 4.22 - 5.81 (MIL/uL)    Hemoglobin 12.1 (*) 13.0 - 17.0 (g/dL)    HCT 11.9 (*) 14.7 - 52.0 (%)    MCV 87.9  78.0 - 100.0 (fL)    MCH 28.8  26.0 - 34.0 (pg)    MCHC 32.8  30.0 - 36.0 (g/dL)    RDW 82.9  56.2 - 13.0 (%)    Platelets 279  150 - 400 (K/uL)   DIFFERENTIAL     Status: Abnormal   Collection Time   12/15/11  6:50 AM      Component Value Range Comment   Neutrophils Relative 73  43 - 77 (%)    Neutro Abs 9.5 (*) 1.7 - 7.7 (K/uL)    Lymphocytes Relative 12  12 - 46 (%)    Lymphs Abs 1.6  0.7 - 4.0 (K/uL)    Monocytes Relative 9  3 - 12 (%)    Monocytes Absolute 1.2 (*) 0.1 - 1.0 (K/uL)    Eosinophils Relative 5  0 - 5 (%)    Eosinophils Absolute 0.7  0.0 - 0.7 (K/uL)    Basophils Relative 1  0 - 1 (%)    Basophils Absolute 0.1  0.0 - 0.1 (K/uL)   GLUCOSE, CAPILLARY     Status: Abnormal   Collection Time   12/15/11  7:31 AM      Component Value Range Comment   Glucose-Capillary 197 (*) 70 - 99 (mg/dL)    Comment 1 Notify RN       Physical  Exam  Nursing note and vitals reviewed.  Constitutional: He is well-developed, well-nourished, and in no distress.  HENT:  Head: Normocephalic.  Eyes: Conjunctivae, EOM and lids are normal. Pupils are equal, round, and reactive to light.  Neck: Normal range of motion.  Cardiovascular: Normal rate.  Pulmonary/Chest: Effort normal.  Abdominal: Soft.  Neurological:   Speech was low volume. Articulation fair to good. Denies any gross sensory abnormalities in the right face arm or leg. Right upper extremity strength grossly 0 to trace out of 5. Right lower extremity grossly 2-5 proximal HF/HE , 0/5 distal with some apraxia noted. Reflexes were hyperactive at 3+ with toes upward on the right. Left sided neuro exam is normal. Patient decreased insight and awareness. Attention was fair to borderline at best. Mood is quite flat. Oriented to name place and reason is  here. He also knew the month.  Skin:  Left CEA site is well-healed with minimal surrounding bruising. Left groin is intact with staples and without drainage.  Assessment/Plan: 1. 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 Physiatrist is providing close team supervision and 24 hour management of active medical problems listed below. Physiatrist and rehab team continue to assess barriers to discharge/monitor patient progress toward functional and medical goals. FIM:                      Expression Expression Mode: Verbal           2. Anticoagulation/DVT prophylaxis with Pharmaceutical: Lovenox Medical Problem List and Plan:  1. DVT Prophylaxis/Anticoagulation: Mechanical: Antiembolism stockings, knee (TED hose) Bilateral lower extremities  Sequential compression devices, below knee Bilateral lower extremities. Consider Lovenox  2. Pain Management: Utilize Tylenol  generously as well as ice and heat. Can use hydrocodone for more significant pain. His pain seems to be in his left groin wound site primarily.  3. Mood: Celexa. Team wanted to provide ego support as patient does appear depressed. Depression screening per social work.  4. HTN: resume low dose ace.  5. DM: continue CBG checks.On tube feeds with elevated BS. Resume metformin and use SSI BS control.  6. Insomnia: When necessary trazodone. Follow sleep patterns.  7. Leukocytosis: Reactive? Monitor for fevers LOS (Days) 1 A FACE TO FACE EVALUATION WAS PERFORMED  Dorell Gatlin E 12/15/2011, 8:05 AM

## 2011-12-15 NOTE — Progress Notes (Signed)
Patient Details  Name: Darrell [name] MRN: [id number] Date of Birth: 1946-07-23  Today's Date: 12/15/2011  Order received and chart reviewed.  Pt is not appropriate for TR services at this time and is placed on HOLD.  Will continue to monitor through team for future participation.    Nahal Wanless 12/15/2011, 11:15 AM

## 2011-12-16 DIAGNOSIS — R894 Abnormal immunological findings in specimens from other organs, systems and tissues: Secondary | ICD-10-CM

## 2011-12-16 LAB — GLUCOSE, CAPILLARY
Glucose-Capillary: 224 mg/dL — ABNORMAL HIGH (ref 70–99)
Glucose-Capillary: 176 mg/dL — ABNORMAL HIGH (ref 70–99)

## 2011-12-16 MED ORDER — METHYLPHENIDATE HCL 5 MG PO TABS
5.0000 mg | ORAL_TABLET | Freq: Two times a day (BID) | ORAL | Status: DC
  Administered 2011-12-16 – 2011-12-17 (×3): 5 mg via ORAL
  Filled 2011-12-16 (×3): qty 1

## 2011-12-16 MED ORDER — INSULIN GLARGINE 100 UNIT/ML ~~LOC~~ SOLN
10.0000 [IU] | Freq: Every day | SUBCUTANEOUS | Status: DC
  Administered 2011-12-16 – 2011-12-17 (×2): 10 [IU] via SUBCUTANEOUS
  Filled 2011-12-16 (×2): qty 3

## 2011-12-16 NOTE — Progress Notes (Signed)
Speech Language Pathology Daily Session Note  Patient Details  Name: Darrell [name] MRN: [id number] Date of Birth: January 28, 1946  Today's Date: 12/16/2011 Time: 1400-1445 Time Calculation (min): 45 min  Short Term Goals: Week 1: SLP Short Term Goal 1 (Week 1): Patient will sustain attention in basic ADL task for 2-3 minutes with maximum verbal/tactile/contextual cues. SLP Short Term Goal 2 (Week 1): Patient will follow basic 1 step commands in familiar, functional ADL task with maximum verbal/tactile/contextual cues. SLP Short Term Goal 3 (Week 1): Patient will verbalize basic wants/needs 75% of the time with maximum semantic cues. SLP Short Term Goal 4 (Week 1): Patient will verbalize automatic/biographical information at 2-3 word level with moderate semantic or sentence completion cues. SLP Short Term Goal 5 (Week 1): Patient will solve basic problems in automatic, rote, familiar ADL tasks with maximum verbal/tactile/contextual cues.  Skilled Therapeutic Interventions: Patient refused p.o. Session focused on naming basic items with presentation of picture with mod assist semantic and phonemic cues; patient's errors were characterized by both semantic and paraphasic errors.  Patient required max assist demonstration, hand over hand cues to follow 1 step directions.  Patient required max assist to utilize external aids for orientation.  Daily Session Precautions/Restrictions  Precautions Precautions: Fall FIM:  Comprehension Comprehension Mode: Auditory Comprehension: 1-Understands basic less than 25% of the time/requires cueing 75% of the time Expression Expression Mode: Verbal Expression: 1-Expresses basis less than 25% of the time/requires cueing greater than 75% of the time. Social Interaction Social Interaction: 2-Interacts appropriately 25 - 49% of time - Needs frequent redirection. Problem Solving Problem Solving: 1-Solves basic less than 25% of the time - needs direction  nearly all the time or does not effectively solve problems and may need a restraint for safety Memory Memory: 1-Recognizes or recalls less than 25% of the time/requires cueing greater than 75% of the time General    Pain Pain Assessment Pain Assessment: No/denies pain Pain Score: 0-No pain  Therapy/Group: Individual Therapy  Charlane Ferretti., CCC-SLP [phone]  Darrell [name] 12/16/2011, 3:54 PM

## 2011-12-16 NOTE — Progress Notes (Signed)
Social Work Patient ID: Darrell [name], male   DOB: 1946/09/26, 66 y.o.   MRN: [id number]  Met with wife to inform of team conference, team's goals and target discharge date.  Goals are mod assist wheelchair level And team's recommendation is short term NHP.  It was felt that wife could not provide the care pt would require at home.  The lifting assist, Wife to talk with daughter's about and also talk with pt.  She has been here and observed in therapies and feels this is the best option for them At this point in time.  She is planning to meet with a lawyer next week to work on [employer] business and their personal finances.  Wife states: " I was afraid This would be the answer, I know how bad the stroke was."  Discussed visiting area facilities and letting worker know her preferences.  Will work  Toward this discharge plan.

## 2011-12-16 NOTE — Patient Care Conference (Signed)
Inpatient RehabilitationTeam Conference Note Date: 12/16/2011   Time: 2:10 PM    Patient Name: Darrell [name]      Medical Record Number: [id number]  Date of Birth: 03-04-1946 Sex: Male         Room/Bed: 4037/4037-01 Payor Info: Payor: MEDICARE  Plan: MEDICARE PART A AND B  Product Type: *No Product type*     Admitting Diagnosis: L MCA AND L BL CVA  Admit Date/Time:  12/14/2011  5:53 PM Admission Comments: No comment available   Primary Diagnosis:  CVA (cerebral infarction) Principal Problem: CVA (cerebral infarction)  Patient Active Problem List  Diagnoses Date Noted  . CVA (cerebral infarction) 12/15/2011  . Occlusion and stenosis of carotid artery without mention of cerebral infarction 12/08/2011  . Carotid artery plaque 12/04/2011  . Left carotid bruit 11/30/2011  . AUTONOMIC NEUROPATHY, DIABETIC 11/25/2010  . ASTHMA, ACUTE 04/07/2010  . HYPERLIPIDEMIA 03/15/2008  . VIRAL URI 02/10/2008  . SLEEP APNEA, OBSTRUCTIVE, SEVERE 07/27/2007  . DIABETES MELLITUS, TYPE II 06/15/2007  . HYPERTENSION 06/15/2007    Expected Discharge Date: SNF Placement  Team Members Present: Physician: Dr. Claudette Laws Case Manager Present: Lutricia Horsfall, RN Social Worker Present: Dossie Der, LCSW Nurse Present: Gregor Hams, RN PT Present: Edson Snowball, PT;Renee Porfirio Oar, PT OT Present: [name], OT SLP Present: Fae Pippin, SLP     Current Status/Progress Goal Weekly Team Focus  Medical   Poor initiation, limited participation with therapy  Improved participation  Ritalin trial   Bowel/Bladder   foley d/c'd 12-15-11 lbm 2-17  manage bowel and bladder  monitor bladder output baldder scan as needed   Swallow/Nutrition/ Hydration   Dys.1 Nectar thick liquids with full superviison   least restrictive p.o. intake  increase attention to task   ADL's   total assist +2 for transfers and bathing, bathing performed bed level  min assist   transfers, initiation, family  education    Mobility   total assist + 2 sitting balance and transfers  min assist W/C level  discharge planning, decreased assist transfers, increase OOB tolerance   Communication   max assist   mod assist  increase attention    Safety/Cognition/ Behavioral Observations  max assist   mod assist  increase attention   Pain   left side abd. vicodin 1 po every 6 hrs  less than 3  monitor efeectiveness of pain med   Skin   left carotid incision dermabond left groin incision with staples mild redness   no new breakdown  monitor skin      *See Interdisciplinary Assessment and Plan and progress notes for long and short-term goals  Barriers to Discharge: Limited progress    Possible Resolutions to Barriers:  See above    Discharge Planning/Teaching Needs:  Wife looking at discharge options, unsure if she can provide the care pt will require.  Pursuing insurance ie, long term care policy      Team Discussion:  Discussion of pt's dx and anticipated outcome. Pt with severe deficits. Extremly apraxic. Foley d/c'd. Incontinent of urine. C/o pain in L abdomen. ?LBM. Team recommending SNF for long-term rehab.  Revisions to Treatment Plan:     Continued Need for Acute Rehabilitation Level of Care: The patient requires daily medical management by a physician with specialized training in physical medicine and rehabilitation for the following conditions: Daily direction of a multidisciplinary physical rehabilitation program to ensure safe treatment while eliciting the highest outcome that is of practical value to the patient.: Yes  Daily medical management of patient stability for increased activity during participation in an intensive rehabilitation regime.: Yes Daily analysis of laboratory values and/or radiology reports with any subsequent need for medication adjustment of medical intervention for : Neurological problems  Meryl Dare 12/16/2011, 2:10 PM

## 2011-12-16 NOTE — Progress Notes (Signed)
Occupational Therapy Session Note  Patient Details  Name: Darrell [name] MRN: [id number] Date of Birth: 1946-02-19  Today's Date: 12/16/2011 Time: 1145-1200 Time Calculation (min): 15 min  Short Term Goals: Week 1:  OT Short Term Goal 1 (Week 1): Pt will complete UB bathing with mod assist with mod verbal cues for initiation OT Short Term Goal 2 (Week 1): Pt will complete UB dressing with mod assist with mod verbal cues for initiation OT Short Term Goal 3 (Week 1): Pt will complete LB bathing with max assist with mod verbal cues for initiation OT Short Term Goal 4 (Week 1): Pt will complete toilet transfer with max assist with mod verbal cues for initiation  Skilled Therapeutic Interventions/Progress Updates:    Engaged in co-treatment with PT to facilitate increased initiation with bed mobility of rolling, sidelying to sit, and weight shifting at hips to scoot to EOB.  Pt reports nausea upon entering and participated despite initial refusal.  Engaged in reaching task while seated EOB to attempt to increase initiation and focus on motor planning, pt required min-mod assist to maintain midline orientation while seated EOB.  Pt requires increased time for processing and initiation.  +2 total assist slideboard transfer to Rt with pt not initiating at all.  Therapy Documentation Precautions:  Precautions Precautions: Fall Precaution Comments: motor apraxia, right hemiplegia, delayed initiation, expressive aphasia Required Braces or Orthoses: No Restrictions Weight Bearing Restrictions: No Other Position/Activity Restrictions: monitor O2 during mobility Vital Signs: Therapy Vitals Temp: 98.2 F (36.8 C) Temp src: Oral Pulse Rate: 88  Resp: 20  BP: 137/87 mmHg Patient Position, if appropriate: Lying Oxygen Therapy SpO2: 96 % O2 Device: None (Room air) Pain: Pain Assessment Pain Assessment: No/denies pain Pain Score: 0-No pain Faces Pain Scale: No hurt Critical Care Pain  Observation Tool (CPOT) Facial Expression: Relaxed, neutral Body Movements: Absence of movements Muscle Tension: Relaxed  See FIM for current functional status  Therapy/Group: Co-Treatment  [name] 12/16/2011, 12:22 PM

## 2011-12-16 NOTE — Progress Notes (Signed)
Occupational Therapy Session Note  Patient Details  Name: Darrell [name] MRN: [id number] Date of Birth: 1946/05/20  Today's Date: 12/16/2011 Time: 0800-0900 Time Calculation (min): 60 min  Short Term Goals: Week 1:  OT Short Term Goal 1 (Week 1): Pt will complete UB bathing with mod assist with mod verbal cues for initiation OT Short Term Goal 2 (Week 1): Pt will complete UB dressing with mod assist with mod verbal cues for initiation OT Short Term Goal 3 (Week 1): Pt will complete LB bathing with max assist with mod verbal cues for initiation OT Short Term Goal 4 (Week 1): Pt will complete toilet transfer with max assist with mod verbal cues for initiation  Skilled Therapeutic Interventions/Progress Updates:    ADL retraining w/c level at sink.  Pt oriented only to self. Pt required increased time and max verbal/tactile cues to respond to  commands and initiate tasks.  Pt exhibited sustained attention ~15 seconds while performing bathing and dressing tasks.  Pt could not verbalize any deficits.  Pt initiated standing for therapist to pull up pants.  Pt responded better with direct commands and counting to 3 to initiate standing.  Pt exhibiting limited weight bearing on RLE but able to assist with standing.  Therapy Documentation Precautions:  Precautions Precautions: Fall Precaution Comments: motor apraxia, right hemiplegia, delayed initiation, expressive aphasia Required Braces or Orthoses: No Restrictions Weight Bearing Restrictions: No Other Position/Activity Restrictions: monitor O2 during mobility  Pain: Pain Assessment Pain Assessment: No/denies pain  See FIM for current functional status  Therapy/Group: Individual Therapy  Rich Brave 12/16/2011, 11:18 AM

## 2011-12-16 NOTE — Progress Notes (Signed)
Heme/Onc  Patient seen in follow up of hypercoagulable work up, now on rehab unit. All testing is now resulted, including B 2 glycoprotein normal Ig G/M/A and anticardiolipin antibodies not elevated, these more reliable than lupus anticoagulant. In summary, no hypercoagulable state identified. Continuing ASA as neurology recommended seems reasonable. If other questions or concerns after discharge, he could be seen outpatient by one of our coagulation physicians in 3-4 months. Please call again if our group can help while in hospital.  I have LM for wife on home phone now, and I am glad to talk with her if she would like.  Thank you Jama Flavors, MD [phone]

## 2011-12-16 NOTE — Progress Notes (Signed)
Physical Therapy Session Note  Patient Details  Name: Darrell [name] MRN: [id number] Date of Birth: Oct 31, 1945  Today's Date: 12/16/2011 Time: 1130-1145 Time Calculation (min): 15 min  Short Term Goals: Week 1:   progressing toward goals  Skilled Therapeutic Interventions/Progress Updates:     Therapy Documentation Precautions:  Precautions Precautions: Fall Precaution Comments: motor apraxia, right hemiplegia, delayed initiation, expressive aphasia Required Braces or Orthoses: No Restrictions Weight Bearing Restrictions: No Other Position/Activity Restrictions: monitor O2 during mobility General: Chart Reviewed: Yes Family/Caregiver Present: No Pain: Pain Assessment Pain Assessment: No/denies pain Pain Score: 0-No pain Faces Pain Scale: No hurt Critical Care Pain Observation Tool (CPOT) Facial Expression: Relaxed, neutral Body Movements: Absence of movements Muscle Tension: Relaxed    Treatments:  30 min co-treat with OT for +2 skilled facilitation throughout session. Rolling left in bed total assist + 2, focus on initiation of movement and sequencing. Supine-sit total assist + 2, manual facilitation at pelvis for terminal weight shift to achieve midline. Sitting EOB with min-mod assist with reaching activity, focus on initiation of movement and sustained attention to single step task of "high five." Slideboard transfer to recliner chair total assist + 2. Patient more alert during session today. Attention currently at focused level. Initiation continues to be significantly delayed with apraxia noted when movement is initiated.  See FIM for current functional status  Therapy/Group: Co-Treatment  Romeo Rabon 12/16/2011, 12:03 PM

## 2011-12-16 NOTE — Progress Notes (Signed)
Patient ID: Darrell BEVILLE, male   DOB: 07-03-1946, 66 y.o.   MRN: [id number] Subjective/Complaints: No c/o s.  Up for foley removal.  Incontinent of Stool per OTA Review of Systems  Neurological: Positive for focal weakness.  Psychiatric/Behavioral: Positive for depression.       Adjustment d/o  All other systems reviewed and are negative.    Objective: Vital Signs: Blood pressure 138/81, pulse 97, temperature 99.2 F (37.3 C), temperature source Oral, resp. rate 20, weight 107.004 kg (235 lb 14.4 oz), SpO2 91.00%. No results found. Results for orders placed during the hospital encounter of 12/14/11 (from the past 72 hour(s))  GLUCOSE, CAPILLARY     Status: Abnormal   Collection Time   12/14/11 11:26 PM      Component Value Range Comment   Glucose-Capillary 190 (*) 70 - 99 (mg/dL)    Comment 1 Notify RN     URINE CULTURE     Status: Normal   Collection Time   12/14/11 11:33 PM      Component Value Range Comment   Specimen Description URINE, CATHETERIZED      Special Requests NONE      Culture  Setup Time [id number]      Colony Count NO GROWTH      Culture NO GROWTH      Report Status 12/15/2011 FINAL     CBC     Status: Abnormal   Collection Time   12/15/11  6:50 AM      Component Value Range Comment   WBC 13.1 (*) 4.0 - 10.5 (K/uL)    RBC 4.20 (*) 4.22 - 5.81 (MIL/uL)    Hemoglobin 12.1 (*) 13.0 - 17.0 (g/dL)    HCT 95.6 (*) 21.3 - 52.0 (%)    MCV 87.9  78.0 - 100.0 (fL)    MCH 28.8  26.0 - 34.0 (pg)    MCHC 32.8  30.0 - 36.0 (g/dL)    RDW 08.6  57.8 - 46.9 (%)    Platelets 279  150 - 400 (K/uL)   COMPREHENSIVE METABOLIC PANEL     Status: Abnormal   Collection Time   12/15/11  6:50 AM      Component Value Range Comment   Sodium 136  135 - 145 (mEq/L)    Potassium 3.9  3.5 - 5.1 (mEq/L)    Chloride 102  96 - 112 (mEq/L)    CO2 23  19 - 32 (mEq/L)    Glucose, Bld 201 (*) 70 - 99 (mg/dL)    BUN 16  6 - 23 (mg/dL)    Creatinine, Ser 6.29  0.50 - 1.35 (mg/dL)    Calcium 8.9  8.4 - 10.5 (mg/dL)    Total Protein 6.6  6.0 - 8.3 (g/dL)    Albumin 2.9 (*) 3.5 - 5.2 (g/dL)    AST 17  0 - 37 (U/L)    ALT 22  0 - 53 (U/L)    Alkaline Phosphatase 44  39 - 117 (U/L)    Total Bilirubin 0.6  0.3 - 1.2 (mg/dL)    GFR calc non Af Amer >90  >90 (mL/min)    GFR calc Af Amer >90  >90 (mL/min)   DIFFERENTIAL     Status: Abnormal   Collection Time   12/15/11  6:50 AM      Component Value Range Comment   Neutrophils Relative 73  43 - 77 (%)    Neutro Abs 9.5 (*) 1.7 - 7.7 (K/uL)  Lymphocytes Relative 12  12 - 46 (%)    Lymphs Abs 1.6  0.7 - 4.0 (K/uL)    Monocytes Relative 9  3 - 12 (%)    Monocytes Absolute 1.2 (*) 0.1 - 1.0 (K/uL)    Eosinophils Relative 5  0 - 5 (%)    Eosinophils Absolute 0.7  0.0 - 0.7 (K/uL)    Basophils Relative 1  0 - 1 (%)    Basophils Absolute 0.1  0.0 - 0.1 (K/uL)   GLUCOSE, CAPILLARY     Status: Abnormal   Collection Time   12/15/11  7:31 AM      Component Value Range Comment   Glucose-Capillary 197 (*) 70 - 99 (mg/dL)    Comment 1 Notify RN     GLUCOSE, CAPILLARY     Status: Abnormal   Collection Time   12/15/11 11:48 AM      Component Value Range Comment   Glucose-Capillary 287 (*) 70 - 99 (mg/dL)    Comment 1 Notify RN     GLUCOSE, CAPILLARY     Status: Abnormal   Collection Time   12/15/11  4:23 PM      Component Value Range Comment   Glucose-Capillary 184 (*) 70 - 99 (mg/dL)    Comment 1 Notify RN     GLUCOSE, CAPILLARY     Status: Abnormal   Collection Time   12/15/11  9:26 PM      Component Value Range Comment   Glucose-Capillary 176 (*) 70 - 99 (mg/dL)    Comment 1 Notify RN     GLUCOSE, CAPILLARY     Status: Abnormal   Collection Time   12/16/11  7:19 AM      Component Value Range Comment   Glucose-Capillary 192 (*) 70 - 99 (mg/dL)    Comment 1 Notify RN       Physical Exam  Nursing note and vitals reviewed.  Constitutional: He is well-developed, well-nourished, and in no distress.  HENT:  Head:  Normocephalic.  Eyes: Conjunctivae, EOM and lids are normal. Pupils are equal, round, and reactive to light.  Neck: Normal range of motion.  Cardiovascular: Normal rate.  Pulmonary/Chest: Effort normal.  Abdominal: Soft.  Neurological:   Speech was low volume. Articulation fair to good. Denies any gross sensory abnormalities in the right face arm or leg. Right upper extremity strength grossly 0 to trace out of 5. Right lower extremity grossly 2-5 proximal HF/HE , 0/5 distal with some apraxia noted. Reflexes were hyperactive at 3+ with toes upward on the right. Left sided neuro exam is normal. Patient decreased insight and awareness. Attention was fair to borderline at best. Mood is quite flat. Oriented to name place and reason is here. He also knew the month.  Skin:  Left CEA site is well-healed with minimal surrounding bruising. Left groin is intact with staples and without drainage.  Assessment/Plan: 1. 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 Physiatrist is providing close team supervision and 24 hour management of active medical problems listed below. Physiatrist and rehab team continue to assess barriers to discharge/monitor patient progress toward functional and medical goals. FIM: FIM - Bathing Bathing Steps Patient Completed: Chest Bathing: 1: Two helpers  FIM - Upper Body Dressing/Undressing Upper body dressing/undressing: 0: Wears gown/pajamas-no public clothing FIM - Lower Body Dressing/Undressing Lower body dressing/undressing: 0: Wears Oceanographer  FIM - Toileting Toileting: 0: Activity did not occur  FIM - Archivist Transfers: 0-Activity did not occur  FIM - Banker Devices: Sliding board Bed/Chair Transfer: 1: Mechanical lift  FIM -  Locomotion: Wheelchair Distance: 3 feet. Unable to coordinate new, complex task due to significant motor apraxia. Locomotion: Wheelchair: 0: Activity did not occur FIM - Locomotion: Ambulation Locomotion: Ambulation: 0: Activity did not occur  Comprehension Comprehension Mode: Auditory Comprehension: 3-Understands basic 50 - 74% of the time/requires cueing 25 - 50%  of the time  Expression Expression Mode: Verbal Expression: 2-Expresses basic 25 - 49% of the time/requires cueing 50 - 75% of the time. Uses single words/gestures.  Social Interaction Social Interaction: 1-Interacts appropriately less than 25% of the time. May be withdrawn or combative.  Problem Solving Problem Solving: 1-Solves basic less than 25% of the time - needs direction nearly all the time or does not effectively solve problems and may need a restraint for safety  Memory Memory: 1-Recognizes or recalls less than 25% of the time/requires cueing greater than 75% of the time  2. Anticoagulation/DVT prophylaxis with Pharmaceutical: Lovenox Medical Problem List and Plan:  1. DVT Prophylaxis/Anticoagulation: Mechanical: Antiembolism stockings, knee (TED hose) Bilateral lower extremities  Sequential compression devices, below knee Bilateral lower extremities. Consider Lovenox  2. Pain Management: Utilize Tylenol generously as well as ice and heat. Can use hydrocodone for more significant pain. His pain seems to be in his left groin wound site primarily.  3. Mood: Celexa. Team wanted to provide ego support as patient does appear depressed. Depression screening per social work.  4. HTN: resume low dose ace.  5. DM: continue CBG checks.On tube feeds with elevated BS. Resume metformin and use SSI BS control. Add Lantus 6. Insomnia: When necessary trazodone. Follow sleep patterns.  7. Leukocytosis: Reactive? Monitor for fevers LOS (Days) 2 A FACE TO FACE EVALUATION WAS PERFORMED  Handy Mcloud E 12/16/2011, 8:37 AM

## 2011-12-16 NOTE — Progress Notes (Signed)
Physical Therapy Session Note  Patient Details  Name: Darrell [name] MRN: [id number] Date of Birth: March 27, 1946  Today's Date: 12/16/2011 Time: 9562-1308 Time Calculation (min): 47 min  Short Term Goals: Week 1:   progressing toward goals  Skilled Therapeutic Interventions/Progress Updates:    Therapy Documentation Precautions:  Precautions Precautions: Fall Precaution Comments: motor apraxia, right hemiplegia, delayed initiation, expressive aphasia Required Braces or Orthoses: No Restrictions Weight Bearing Restrictions: No Other Position/Activity Restrictions: monitor O2 during mobility General: Chart Reviewed: Yes Family/Caregiver Present: No   Pain: Pain Assessment Pain Assessment: No/denies pain Pain Score: 0-No pain    Treatments:  Patient in recliner chair upon entry into room. Slideboard transfers left and right to mat table in gym total assist + 2, patient = < 25%. Sitting dynamic balance activities with emphasis on sustained attention to functional task supervision-mod assist. Intermittently improved trunk control in midline position, fluctuates with attention and apraxia during attempted movement. Sit-stand total assist + 2, patient = < 25%. Once in standing right LE flexor withdrawal noted. Standing with standing frame and gait belt around right ankle to facilitate right knee extension. Patient demonstrated upright posture throughout trunk, manual facilitation at pelvis to increase right lateral weight shift for symmetrical weight bearing while engaging in functional task of cleaning his glasses. Patient stood in this position x 7 min and 5 min. Decreased arousal noted at end of session with frequent closing of eyes. Fell asleep in recliner on the way back to his room. Quick release belt in place, RN aware.  See FIM for current functional status  Therapy/Group: Individual Therapy  Romeo Rabon 12/16/2011, 4:58 PM

## 2011-12-17 MED ORDER — METFORMIN HCL 500 MG PO TABS
1000.0000 mg | ORAL_TABLET | Freq: Two times a day (BID) | ORAL | Status: DC
  Administered 2011-12-17 – 2011-12-24 (×14): 1000 mg via ORAL
  Filled 2011-12-17 (×17): qty 2

## 2011-12-17 NOTE — Progress Notes (Signed)
Physical Therapy Session Note  Patient Details  Name: Darrell [name] MRN: [id number] Date of Birth: 1946/03/06  Today's Date: 12/17/2011 Time: 1017-1059 Time Calculation (min): 42 min   Skilled Therapeutic Interventions/Progress Updates:     Therapy Documentation Precautions:  Precautions Precautions: Fall Precaution Comments: motor apraxia, right hemiplegia, delayed initiation, right LE flexor withdrawl Required Braces or Orthoses: No Restrictions Weight Bearing Restrictions: No Other Position/Activity Restrictions: monitor O2 during mobility General: Chart Reviewed: Yes Family/Caregiver Present: No   Pain: Pain Assessment Pain Assessment: No/denies pain Pain Score: 0-No pain    Treatments:  Patient lethargic today, but agreeable to participate. Standing activities with standing frame, gait belt around right ankle and frame to decrease flexor withdrawal, with dynamic reaching activity with focus on right lateral weight shift and sustained attention to task. During activity, patient closed eyes and kept them closed stating "I can't do this." Patient with more depressed affect today, telling OT he was "pitiful." Encouragement and verbal/tactile cues provided, however patient would not keep his eyes open. Returned to w/c for safety.  See FIM for current functional status  Therapy/Group: Individual Therapy  Romeo Rabon 12/17/2011, 12:03 PM

## 2011-12-17 NOTE — Progress Notes (Signed)
Speech Language Pathology Daily Session Note  Patient Details  Name: Darrell [name] MRN: [id number] Date of Birth: 12/23/1945  Today's Date: 12/17/2011 Time: 0800-0855 Time Calculation (min): 55 min  Short Term Goals: Week 1: SLP Short Term Goal 1 (Week 1): Patient will sustain attention in basic ADL task for 2-3 minutes with maximum verbal/tactile/contextual cues. SLP Short Term Goal 2 (Week 1): Patient will follow basic 1 step commands in familiar, functional ADL task with maximum verbal/tactile/contextual cues. SLP Short Term Goal 3 (Week 1): Patient will verbalize basic wants/needs 75% of the time with maximum semantic cues. SLP Short Term Goal 4 (Week 1): Patient will verbalize automatic/biographical information at 2-3 word level with moderate semantic or sentence completion cues. SLP Short Term Goal 5 (Week 1): Patient will solve basic problems in automatic, rote, familiar ADL tasks with maximum verbal/tactile/contextual cues.  Skilled Therapeutic Interventions: Session focused on sustaining attention to self feeding task; SLP facilitated session with max assist hand over hand cues to scoop bites; max assist verbal, tactile cues to sustain attention to bite for 30-45 seconds.  SLP facilitated session with total assist to follow 1 step directions within context of meal/self feeding and max assist semantic and phonemic cues to label BADL objects on tray with 95% accuracy.  Patient displayed no overt s/s of aspiration with meal but required 2 cues to clear throat due to wet vocal quality and suspected laryngeal penetration, which throat clear appeared to clear.    Daily Session Precautions/Restrictions  Precautions Precautions: Fall FIM:  Comprehension Comprehension Mode: Auditory Comprehension: 1-Understands basic less than 25% of the time/requires cueing 75% of the time Expression Expression Mode: Verbal Expression: 1-Expresses basis less than 25% of the time/requires cueing  greater than 75% of the time. Social Interaction Social Interaction: 1-Interacts appropriately less than 25% of the time. May be withdrawn or combative. Problem Solving Problem Solving: 1-Solves basic less than 25% of the time - needs direction nearly all the time or does not effectively solve problems and may need a restraint for safety Memory Memory: 1-Recognizes or recalls less than 25% of the time/requires cueing greater than 75% of the time FIM - Eating Eating Activity: 2: Hand over hand assist Pain Pain Assessment Pain Assessment: No/denies pain Pain Score: 0-No pain  Therapy/Group: Individual Therapy  Darrell [name]., CCC-SLP [phone]  Darrell [name] 12/17/2011, 4:12 PM

## 2011-12-17 NOTE — Progress Notes (Signed)
Physical Therapy Note  Patient Details  Name: BIENVENIDO PROEHL MRN: [id number] Date of Birth: April 09, 1946 Today's Date: 12/17/2011  1445-1515 (40 minutes) individual Pain- no c/o pain Focus of treatment: Standing activities to facilitate increased weight bearing and knee extension control  Treatment: Supine to sit : max cues and assist to attend to RT UE +mod assist; Transfer (sliding board) setup + total assist +2 bed<>wc; wc mobility using LT extremities- pt unable to coordinate use of uninvolved UE and LE for propulsion and steering (max assist); sit to stand in parallel bars mod/max assist. Pt stood X 3 for approximately 3-4 minutes mod assist with assist to maintain terminal knee extension on RT.   Lourdes Kucharski,JIM 12/17/2011, 3:16 PM

## 2011-12-17 NOTE — Progress Notes (Signed)
[facility]  Subjective  -s/p L CEA   Pt in good spirits today, wife at bedside No complaints of pain Able to eat   Physical Exam:  Verbal communication and comprehension much improved Right leg continues to gain strength.  Able to completely lift it off the bed Right arm with no motor function, but has sensation  Assessment/Plan:  Lengthy conversation with wife and patient regarding expectations and current situation. Encouraged continued optimism with current limitations Will continue to follow  Talmadge Ganas IV, V. WELLS 12/17/2011 10:25 PM --  Filed Vitals:   12/17/11 1551  BP: 133/90  Pulse: 102  Temp: 98.2 F (36.8 C)  Resp:     Intake/Output Summary (Last 24 hours) at 12/17/11 2225 Last data filed at 12/17/11 1500  Gross per 24 hour  Intake    240 ml  Output      7 ml  Net    233 ml     Laboratory CBC    Component Value Date/Time   WBC 13.1* 12/15/2011 0650   HGB 12.1* 12/15/2011 0650   HCT 36.9* 12/15/2011 0650   PLT 279 12/15/2011 0650    BMET    Component Value Date/Time   NA 136 12/15/2011 0650   K 3.9 12/15/2011 0650   CL 102 12/15/2011 0650   CO2 23 12/15/2011 0650   GLUCOSE 201* 12/15/2011 0650   GLUCOSE 141* 10/05/2006 0904   BUN 16 12/15/2011 0650   CREATININE 0.65 12/15/2011 0650   CALCIUM 8.9 12/15/2011 0650   GFRNONAA >90 12/15/2011 0650   GFRAA >90 12/15/2011 0650    COAG Lab Results  Component Value Date   INR 1.05 12/09/2011   INR 1.16 12/09/2011   INR 1.07 12/08/2011   No results found for this basename: PTT    Antibiotics Anti-infectives    None       V. Charlena Cross, M.D. [facility] Office: [phone] Pager:  [phone]

## 2011-12-17 NOTE — Progress Notes (Signed)
Occupational Therapy Session Note  Patient Details  Name: Darrell [name] MRN: [id number] Date of Birth: [birth year]  Today's Date: 12/17/2011 Time: 4098-1191 Time Calculation (min): 46 min  Short Term Goals: Week 1:  OT Short Term Goal 1 (Week 1): Pt will complete UB bathing with mod assist with mod verbal cues for initiation OT Short Term Goal 2 (Week 1): Pt will complete UB dressing with mod assist with mod verbal cues for initiation OT Short Term Goal 3 (Week 1): Pt will complete LB bathing with max assist with mod verbal cues for initiation OT Short Term Goal 4 (Week 1): Pt will complete toilet transfer with max assist with mod verbal cues for initiation  Skilled Therapeutic Interventions/Progress Updates:    Worked on bathing and dressing at the sink sit to stand.  Pt overall with decreased sustained attention and initiation for selfcare tasks.  Required max demonstrational cueing to initiate all bathing.  At times would verbally respond that he could perform washing but lacked initiation.  Fifty percent of the time he needed initial hand placement and guiding to wash.  Max demonstrational cues for awareness of the right side for all postioning and washing.  Total hand over hand assist to functionally attempt using the RUE for washing.  Needs total +2 assist for for all sit to stand.  Pt's current attention level at approximately 10 seconds and then attempts to close his eyes.  Therapy Documentation Precautions:  Precautions Precautions: Fall Precaution Comments: motor apraxia, right hemiplegia, delayed initiation, right LE flexor withdrawl Required Braces or Orthoses: No Restrictions Weight Bearing Restrictions: No Other Position/Activity Restrictions: monitor O2 during mobility  Pain: Pain Assessment Pain Assessment: No/denies pain Pain Score: 0-No pain ADL: See FIM for current functional status  Therapy/Group: Individual Therapy  Thomasenia Dowse OTR/L 12/17/2011, 12:37  PM

## 2011-12-17 NOTE — Progress Notes (Signed)
Per State Regulation 482.30 This chart was reviewed for medical necessity with respect to the patient's Admission/Duration of stay. Pt participating in therapies. Pt  total A overall. Lethargic today. Poor attention, initiation. On Ritalin trial. DM not controlled. Adjusting DM meds. Meryl Dare                 Nurse Care Manager            Next Review Date: 12/17/11

## 2011-12-17 NOTE — Progress Notes (Signed)
Patient ID: ELVER STADLER, male   DOB: 12-16-1945, 66 y.o.   MRN: [id number] Subjective/Complaints: No c/o s.  Up for foley removal.  Incontinent of Stool per OTA Review of Systems  Neurological: Positive for focal weakness.  Psychiatric/Behavioral: Positive for depression.       Adjustment d/o  All other systems reviewed and are negative.    Objective: Vital Signs: Blood pressure 156/90, pulse 83, temperature 98 F (36.7 C), temperature source Oral, resp. rate 18, weight 103.3 kg (227 lb 11.8 oz), SpO2 94.00%. No results found. Results for orders placed during the hospital encounter of 12/14/11 (from the past 72 hour(s))  GLUCOSE, CAPILLARY     Status: Abnormal   Collection Time   12/14/11 11:26 PM      Component Value Range Comment   Glucose-Capillary 190 (*) 70 - 99 (mg/dL)    Comment 1 Notify RN     URINE CULTURE     Status: Normal   Collection Time   12/14/11 11:33 PM      Component Value Range Comment   Specimen Description URINE, CATHETERIZED      Special Requests NONE      Culture  Setup Time [id number]      Colony Count NO GROWTH      Culture NO GROWTH      Report Status 12/15/2011 FINAL     CBC     Status: Abnormal   Collection Time   12/15/11  6:50 AM      Component Value Range Comment   WBC 13.1 (*) 4.0 - 10.5 (K/uL)    RBC 4.20 (*) 4.22 - 5.81 (MIL/uL)    Hemoglobin 12.1 (*) 13.0 - 17.0 (g/dL)    HCT 95.6 (*) 21.3 - 52.0 (%)    MCV 87.9  78.0 - 100.0 (fL)    MCH 28.8  26.0 - 34.0 (pg)    MCHC 32.8  30.0 - 36.0 (g/dL)    RDW 08.6  57.8 - 46.9 (%)    Platelets 279  150 - 400 (K/uL)   COMPREHENSIVE METABOLIC PANEL     Status: Abnormal   Collection Time   12/15/11  6:50 AM      Component Value Range Comment   Sodium 136  135 - 145 (mEq/L)    Potassium 3.9  3.5 - 5.1 (mEq/L)    Chloride 102  96 - 112 (mEq/L)    CO2 23  19 - 32 (mEq/L)    Glucose, Bld 201 (*) 70 - 99 (mg/dL)    BUN 16  6 - 23 (mg/dL)    Creatinine, Ser 6.29  0.50 - 1.35 (mg/dL)    Calcium 8.9  8.4 - 10.5 (mg/dL)    Total Protein 6.6  6.0 - 8.3 (g/dL)    Albumin 2.9 (*) 3.5 - 5.2 (g/dL)    AST 17  0 - 37 (U/L)    ALT 22  0 - 53 (U/L)    Alkaline Phosphatase 44  39 - 117 (U/L)    Total Bilirubin 0.6  0.3 - 1.2 (mg/dL)    GFR calc non Af Amer >90  >90 (mL/min)    GFR calc Af Amer >90  >90 (mL/min)   DIFFERENTIAL     Status: Abnormal   Collection Time   12/15/11  6:50 AM      Component Value Range Comment   Neutrophils Relative 73  43 - 77 (%)    Neutro Abs 9.5 (*) 1.7 - 7.7 (K/uL)  Lymphocytes Relative 12  12 - 46 (%)    Lymphs Abs 1.6  0.7 - 4.0 (K/uL)    Monocytes Relative 9  3 - 12 (%)    Monocytes Absolute 1.2 (*) 0.1 - 1.0 (K/uL)    Eosinophils Relative 5  0 - 5 (%)    Eosinophils Absolute 0.7  0.0 - 0.7 (K/uL)    Basophils Relative 1  0 - 1 (%)    Basophils Absolute 0.1  0.0 - 0.1 (K/uL)   GLUCOSE, CAPILLARY     Status: Abnormal   Collection Time   12/15/11  7:31 AM      Component Value Range Comment   Glucose-Capillary 197 (*) 70 - 99 (mg/dL)    Comment 1 Notify RN     GLUCOSE, CAPILLARY     Status: Abnormal   Collection Time   12/15/11 11:48 AM      Component Value Range Comment   Glucose-Capillary 287 (*) 70 - 99 (mg/dL)    Comment 1 Notify RN     GLUCOSE, CAPILLARY     Status: Abnormal   Collection Time   12/15/11  4:23 PM      Component Value Range Comment   Glucose-Capillary 184 (*) 70 - 99 (mg/dL)    Comment 1 Notify RN     GLUCOSE, CAPILLARY     Status: Abnormal   Collection Time   12/15/11  9:26 PM      Component Value Range Comment   Glucose-Capillary 176 (*) 70 - 99 (mg/dL)    Comment 1 Notify RN     GLUCOSE, CAPILLARY     Status: Abnormal   Collection Time   12/16/11  7:19 AM      Component Value Range Comment   Glucose-Capillary 192 (*) 70 - 99 (mg/dL)    Comment 1 Notify RN     GLUCOSE, CAPILLARY     Status: Abnormal   Collection Time   12/16/11 11:06 AM      Component Value Range Comment   Glucose-Capillary 224 (*) 70 - 99  (mg/dL)    Comment 1 Notify RN     GLUCOSE, CAPILLARY     Status: Abnormal   Collection Time   12/16/11  4:08 PM      Component Value Range Comment   Glucose-Capillary 175 (*) 70 - 99 (mg/dL)    Comment 1 Notify RN     GLUCOSE, CAPILLARY     Status: Abnormal   Collection Time   12/16/11  9:19 PM      Component Value Range Comment   Glucose-Capillary 176 (*) 70 - 99 (mg/dL)    Comment 1 Notify RN       Physical Exam  Nursing note and vitals reviewed.  Constitutional: He is well-developed, well-nourished, and in no distress.  HENT:  Head: Normocephalic.  Eyes: Conjunctivae, EOM and lids are normal. Pupils are equal, round, and reactive to light.  Neck: Normal range of motion.  Cardiovascular: Normal rate.  Pulmonary/Chest: Effort normal.  Abdominal: Soft.  Neurological:   Speech was low volume. Articulation fair to good.Right upper extremity strength grossly 0 to trace out of 5. Right lower extremity grossly 2-5 proximal HF/HE , 0/5 distal with some apraxia noted. Reflexes were hyperactive at 3+ with toes upward on the right. Left sided neuro exam is normal. Patient decreased insight and awareness. Attention was fair to borderline at best. Mood is quite flat. Oriented to person but not place time, just woke up.  Skin:  Left CEA site is well-healed with minimal surrounding bruising. Left groin is intact with staples and without drainage.  Assessment/Plan: 1. 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 Physiatrist is providing close team supervision and 24 hour management of active medical problems listed below. Physiatrist and rehab team continue to assess barriers to discharge/monitor patient progress toward functional and medical goals. FIM: FIM - Bathing Bathing Steps Patient Completed: Right Arm;Chest Bathing: 1: Two helpers  FIM -  Upper Body Dressing/Undressing Upper body dressing/undressing: 1: Total-Patient completed less than 25% of tasks FIM - Lower Body Dressing/Undressing Lower body dressing/undressing: 1: Two helpers  FIM - Toileting Toileting: 0: Activity did not occur  FIM - Archivist Transfers DO NOT USE: 0-Activity did not occur  FIM - Banker Devices: Sliding board Bed/Chair Transfer: 1: Two helpers;1: Bed > Chair or W/C: Total A (helper does all/Pt. < 25%);1: Supine > Sit: Total A (helper does all/Pt. < 25%)  FIM - Locomotion: Wheelchair Distance: 3 feet. Unable to coordinate new, complex task due to significant motor apraxia. Locomotion: Wheelchair: 0: Activity did not occur FIM - Locomotion: Ambulation Locomotion: Ambulation: 0: Activity did not occur (pre-gait level at this time)  Comprehension Comprehension Mode: Auditory Comprehension: 1-Understands basic less than 25% of the time/requires cueing 75% of the time  Expression Expression Mode: Verbal Expression: 1-Expresses basis less than 25% of the time/requires cueing greater than 75% of the time.  Social Interaction Social Interaction: 1-Interacts appropriately less than 25% of the time. May be withdrawn or combative.  Problem Solving Problem Solving: 1-Solves basic less than 25% of the time - needs direction nearly all the time or does not effectively solve problems and may need a restraint for safety  Memory Memory: 1-Recognizes or recalls less than 25% of the time/requires cueing greater than 75% of the time  2. Anticoagulation/DVT prophylaxis with Pharmaceutical: Lovenox Medical Problem List and Plan:  1. DVT Prophylaxis/Anticoagulation: Mechanical: Antiembolism stockings, knee (TED hose) Bilateral lower extremities  Sequential compression devices, below knee Bilateral lower extremities. Consider Lovenox  2. Pain Management: Utilize Tylenol generously as well as ice and heat.  Can use hydrocodone for more significant pain. His pain seems to be in his left groin wound site primarily.  3. Mood: Celexa. Team wanted to provide ego support as patient does appear depressed. Depression screening per social work.  4. HTN: resume low dose ace.  5. DM: continue CBG checks.On tube feeds with elevated BS. Resume metformin and use SSI BS control. Add Lantus 6. Insomnia: When necessary trazodone. Follow sleep patterns.  7. Leukocytosis: Reactive? Monitor for fevers LOS (Days) 3 A FACE TO FACE EVALUATION WAS PERFORMED  Florabelle Cardin E 12/17/2011, 7:53 AM

## 2011-12-18 ENCOUNTER — Telehealth: Payer: Self-pay

## 2011-12-18 ENCOUNTER — Telehealth: Payer: Self-pay | Admitting: Oncology

## 2011-12-18 LAB — GLUCOSE, CAPILLARY
Glucose-Capillary: 188 mg/dL — ABNORMAL HIGH (ref 70–99)
Glucose-Capillary: 194 mg/dL — ABNORMAL HIGH (ref 70–99)
Glucose-Capillary: 164 mg/dL — ABNORMAL HIGH (ref 70–99)

## 2011-12-18 MED ORDER — INSULIN GLARGINE 100 UNIT/ML ~~LOC~~ SOLN
15.0000 [IU] | Freq: Every day | SUBCUTANEOUS | Status: DC
  Administered 2011-12-18: 15 [IU] via SUBCUTANEOUS

## 2011-12-18 MED ORDER — METHYLPHENIDATE HCL 5 MG PO TABS
10.0000 mg | ORAL_TABLET | Freq: Two times a day (BID) | ORAL | Status: DC
  Administered 2011-12-18 – 2011-12-24 (×14): 10 mg via ORAL
  Filled 2011-12-18: qty 2
  Filled 2011-12-18: qty 1
  Filled 2011-12-18 (×13): qty 2

## 2011-12-18 NOTE — Progress Notes (Signed)
Speech Language Pathology Daily Session Note  Patient Details  Name: Darrell [name] MRN: [id number] Date of Birth: [birth year]  Today's Date: 12/18/2011 Time: 0800-0845 Time Calculation (min): 45 min  Short Term Goals: Week 1: SLP Short Term Goal 1 (Week 1): Patient will sustain attention in basic ADL task for 2-3 minutes with maximum verbal/tactile/contextual cues. SLP Short Term Goal 2 (Week 1): Patient will follow basic 1 step commands in familiar, functional ADL task with maximum verbal/tactile/contextual cues. SLP Short Term Goal 3 (Week 1): Patient will verbalize basic wants/needs 75% of the time with maximum semantic cues. SLP Short Term Goal 4 (Week 1): Patient will verbalize automatic/biographical information at 2-3 word level with moderate semantic or sentence completion cues. SLP Short Term Goal 5 (Week 1): Patient will solve basic problems in automatic, rote, familiar ADL tasks with maximum verbal/tactile/contextual cues.  Skilled Therapeutic Interventions: Session focused on sustained attention to basic, familiar self feeding task.  Patient able to orient self to place with use of SLP's name badge; however, required total assist for orientation to situation and time; RN administered medications crushed in puree and patient demonstrated oral holding and piecemeal swallowing suspected due to taste of medication; SLP facilitated dysphagia treatment with total assist attempts with Dys.1 textures and trials of Dys.2 textures patient with no initiation and was even resistant to hand over hand assist as well as SLP's attempts to feed.  Patient was tearful and verbally perseveration on "I cannot do this" "No, I do not want to do this" "I cannot believe this has happened."  SLP attempted to call patient's wife to determine food preferences with no answer; plan to follow up.  Patient's verbal expression indicates increased awareness of deficits at some level.     Daily  Session Precautions/Restrictions   Dys. 1 textures; Nectar-thick liquids FIM:  Comprehension Comprehension Mode: Auditory Comprehension: 1-Understands basic less than 25% of the time/requires cueing 75% of the time Expression Expression Mode: Verbal Expression: 1-Expresses basis less than 25% of the time/requires cueing greater than 75% of the time. Social Interaction Social Interaction: 1-Interacts appropriately less than 25% of the time. May be withdrawn or combative. Problem Solving Problem Solving: 1-Solves basic less than 25% of the time - needs direction nearly all the time or does not effectively solve problems and may need a restraint for safety Memory Memory: 1-Recognizes or recalls less than 25% of the time/requires cueing greater than 75% of the time FIM - Eating Eating Activity: 1: Helper feeds patient General    Pain Pain Assessment Pain Assessment: No/denies pain Pain Score: 0-No pain  Therapy/Group: Individual Therapy  Charlane Ferretti., CCC-SLP [phone]  Briceyda Abdullah 12/18/2011, 9:16 AM

## 2011-12-18 NOTE — Telephone Encounter (Signed)
Wife returned my call from earlier this week. Told her that all of coagulation tests have resulted and that we cannot specifically identify an abnormality (lupus anticoagulant not reliable and anticardiolipin and B2 glycoprotein do not confirm). I have offered one of our coagulation MDs to see him when he is out of hospital; she is comfortable discussing this with primary Dr.Jeff Todd when rehab completes and he is out of hospital, so she did not want to set anything up at this point. Wife appreciated call.

## 2011-12-18 NOTE — Progress Notes (Signed)
Physical Therapy Session Note  Patient Details  Name: Darrell [name] MRN: [id number] Date of Birth: February 21, 1946  Today's Date: 12/18/2011 Time: 0930-1015 Time Calculation (min): 45 min  Short Term Goals: Week 1:   see goals on eval  Skilled Therapeutic Interventions/Progress Updates:    therapeutic activity to increase I with bed mobility and transfer, cognitive rehab working on memory and intellectual awareness and expressing self; pt c/o fatigue and frequently closing eyes, decreased activity tolerance. Pt had been very tearful with ST but was not with PT, encouragment and education on stroke recovery provided.  Therapy Documentation Precautions:  Precautions Precautions: Fall Precaution Comments: motor apraxia, right hemiplegia, delayed initiation, right LE flexor withdrawl Required Braces or Orthoses: No Restrictions Weight Bearing Restrictions: No Other Position/Activity Restrictions: monitor O2 during mobility     Pain: Pain Assessment Pain Assessment: No/denies pain Pain Score: 0-No pain Mobility:  rolling R min A, rolling left initially total A x 2 but progressed to max A with cues after sequnetial practice and LE stretching; supine to sit to L max A with total instructional cues and A to bring both LE's off d/t apraxia and pt trying to take rest breaks in the middle of supine to sit  Transfer training with pt given prolonged time to intiate slide board bed to w/c to R slight downhill with total A +2 (pt +25%, one to hold chair) Locomotion : Wheelchair Mobility Distance: 25 mod A with max encouragment and cues with L extremities, straight and 1 turn    Balance:sitting EOB with close S except required physical assist during rest breaks so he could lean back, static balance   Exercises:passive stretch to BLE hip rotators, hamstrings, heel cords with tighness R > L which limits mobility and pt did c/o stretch pain when attempting foot flat with overpressure; A/AROM 10  heel slides and IR with hip/knee flexed     See FIM for current functional status  Therapy/Group: Individual Therapy  Michaelene Song 12/18/2011, 10:54 AM

## 2011-12-18 NOTE — Progress Notes (Signed)
Patient ID: Darrell [name], male   DOB: 09/17/1946, 66 y.o.   MRN: [id number] Subjective/Complaints: Spoke with pt's wife yesterday about prognosis for functional recovery, advised one level home.  Pt incompetant to manage finances. Review of Systems  Neurological: Positive for focal weakness.  Psychiatric/Behavioral: Positive for depression.       Adjustment d/o  All other systems reviewed and are negative.    Objective: Vital Signs: Blood pressure 127/80, pulse 96, temperature 98.3 F (36.8 C), temperature source Oral, resp. rate 18, weight 103.3 kg (227 lb 11.8 oz), SpO2 96.00%. No results found. Results for orders placed during the hospital encounter of 12/14/11 (from the past 72 hour(s))  GLUCOSE, CAPILLARY     Status: Abnormal   Collection Time   12/15/11  7:31 AM      Component Value Range Comment   Glucose-Capillary 197 (*) 70 - 99 (mg/dL)    Comment 1 Notify RN     GLUCOSE, CAPILLARY     Status: Abnormal   Collection Time   12/15/11 11:48 AM      Component Value Range Comment   Glucose-Capillary 287 (*) 70 - 99 (mg/dL)    Comment 1 Notify RN     GLUCOSE, CAPILLARY     Status: Abnormal   Collection Time   12/15/11  4:23 PM      Component Value Range Comment   Glucose-Capillary 184 (*) 70 - 99 (mg/dL)    Comment 1 Notify RN     GLUCOSE, CAPILLARY     Status: Abnormal   Collection Time   12/15/11  9:26 PM      Component Value Range Comment   Glucose-Capillary 176 (*) 70 - 99 (mg/dL)    Comment 1 Notify RN     GLUCOSE, CAPILLARY     Status: Abnormal   Collection Time   12/16/11  7:19 AM      Component Value Range Comment   Glucose-Capillary 192 (*) 70 - 99 (mg/dL)    Comment 1 Notify RN     GLUCOSE, CAPILLARY     Status: Abnormal   Collection Time   12/16/11 11:06 AM      Component Value Range Comment   Glucose-Capillary 224 (*) 70 - 99 (mg/dL)    Comment 1 Notify RN     GLUCOSE, CAPILLARY     Status: Abnormal   Collection Time   12/16/11  4:08 PM      Component  Value Range Comment   Glucose-Capillary 175 (*) 70 - 99 (mg/dL)    Comment 1 Notify RN     GLUCOSE, CAPILLARY     Status: Abnormal   Collection Time   12/16/11  9:19 PM      Component Value Range Comment   Glucose-Capillary 176 (*) 70 - 99 (mg/dL)    Comment 1 Notify RN     GLUCOSE, CAPILLARY     Status: Abnormal   Collection Time   12/17/11 11:56 AM      Component Value Range Comment   Glucose-Capillary 230 (*) 70 - 99 (mg/dL)   GLUCOSE, CAPILLARY     Status: Abnormal   Collection Time   12/17/11  3:59 PM      Component Value Range Comment   Glucose-Capillary 184 (*) 70 - 99 (mg/dL)    Comment 1 Notify RN     GLUCOSE, CAPILLARY     Status: Abnormal   Collection Time   12/17/11  9:20 PM      Component  Value Range Comment   Glucose-Capillary 190 (*) 70 - 99 (mg/dL)    Comment 1 Notify RN       Physical Exam  Nursing note and vitals reviewed.  Constitutional: He is well-developed, well-nourished, and in no distress.  HENT:  Head: Normocephalic.  Eyes: Conjunctivae, EOM and lids are normal. Pupils are equal, round, and reactive to light.  Neck: Normal range of motion.  Cardiovascular: Normal rate.  Pulmonary/Chest: Effort normal.  Abdominal: Soft.  Neurological:   Speech was low volume. Articulation fair to good.Right upper extremity strength grossly 0 to trace out of 5. Right lower extremity grossly 2-5 proximal HF/HE , 0/5 distal with some apraxia noted. Reflexes were hyperactive at 3+ with toes upward on the right. Left sided neuro exam is normal. Patient decreased insight and awareness. Attention was fair to borderline at best. Mood is quite flat. Oriented to person but not place time, or situation.  Skin:  Left CEA site is well-healed with minimal surrounding bruising. Left groin is intact with staples and without drainage.  Assessment/Plan: 1. 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 Physiatrist is providing close team supervision and 24 hour management of active medical problems listed below. Physiatrist and rehab team continue to assess barriers to discharge/monitor patient progress toward functional and medical goals. FIM: FIM - Bathing Bathing Steps Patient Completed: Chest;Abdomen;Left upper leg Bathing: 1: Two helpers  FIM - Upper Body Dressing/Undressing Upper body dressing/undressing: 1: Total-Patient completed less than 25% of tasks FIM - Lower Body Dressing/Undressing Lower body dressing/undressing: 1: Two helpers  FIM - Toileting Toileting: 0: Activity did not occur  FIM - Archivist Transfers: 0-Activity did not occur Toilet Transfers DO NOT USE: 0-Activity did not occur  FIM - Banker Devices: Sliding board Bed/Chair Transfer: 1: Two helpers  FIM - Locomotion: Wheelchair Distance: 3 feet. Unable to coordinate new, complex task due to significant motor apraxia. Locomotion: Wheelchair: 1: Total Assistance/staff pushes wheelchair (Pt<25%) FIM - Locomotion: Ambulation Locomotion: Ambulation: 0: Activity did not occur (pre-gait level at this time)  Comprehension Comprehension Mode: Auditory Comprehension: 1-Understands basic less than 25% of the time/requires cueing 75% of the time  Expression Expression Mode: Verbal Expression: 1-Expresses basis less than 25% of the time/requires cueing greater than 75% of the time.  Social Interaction Social Interaction: 1-Interacts appropriately less than 25% of the time. May be withdrawn or combative.  Problem Solving Problem Solving: 1-Solves basic less than 25% of the time - needs direction nearly all the time or does not effectively solve problems and may need a restraint for safety  Memory Memory: 1-Recognizes or recalls less than 25% of the time/requires cueing greater than 75% of  the time  2. Anticoagulation/DVT prophylaxis with Pharmaceutical: Lovenox Medical Problem List and Plan:  1. DVT Prophylaxis/Anticoagulation: Mechanical: Antiembolism stockings, knee (TED hose) Bilateral lower extremities  Sequential compression devices, below knee Bilateral lower extremities. Consider Lovenox  2. Pain Management: Utilize Tylenol generously as well as ice and heat. Can use hydrocodone for more significant pain. His pain seems to be in his left groin wound site primarily.  3. Mood: Celexa. Team wanted to provide ego support as patient does appear depressed. Depression screening per social work.  4. HTN: resume low dose ace. Good control overall. 5. DM: continue CBG checks.On tube feeds with elevated BS. Resume metformin and use SSI  BS control. Increase Lantus 6. Insomnia: When necessary trazodone. Follow sleep patterns.  7. Leukocytosis: Reactive? Monitor for fevers LOS (Days) 4 A FACE TO FACE EVALUATION WAS PERFORMED  Seth Friedlander E 12/18/2011, 7:29 AM

## 2011-12-18 NOTE — Telephone Encounter (Signed)
MS. [name]  CALLED STATING THAT SHE RECEIVED A MESSAGE FROM DR. Darrold Span TO CALL BACK REGARDING BLOOD TEST RESULTS ON HER HUSBAND.  SHE CAN BE REACHED AT [phone].

## 2011-12-18 NOTE — Progress Notes (Signed)
Occupational Therapy Note  Patient Details  Name: Darrell [name] MRN: [id number] Date of Birth: September 15, 1946 Today's Date: 12/18/2011  Time In:  1105  Time Out:  1203.  Pt with intermittent pain in groin with standing but alleviated with repositioning.  Unable to score pain.  ADL retraining at sink level with emphasis on sustained attention during basic functional tasks, following one step commands consistently, engaging cognitively in basic functional tasks, basic orientation and insight, basic problem solving,attending to the right,motor planning, sit to stand, standing balance, stepping.  Patient participated in every step of bathing, dressing and grooming without any incident of agitation.  Patient able to recall why he was in the hospital after 50 minutes.  Attention improving from focused to sustained with basic functional tasks.   Norton Pastel 12/18/2011, 12:24 PM

## 2011-12-18 NOTE — Progress Notes (Signed)
Occupational Therapy Session Note  Patient Details  Name: Darrell [name] MRN: [id number] Date of Birth: [birth year]  Today's Date: 12/18/2011 Time: 7829-5621 Time Calculation (min): 45 min  Short Term Goals: Week 1:  OT Short Term Goal 1 (Week 1): Pt will complete UB bathing with mod assist with mod verbal cues for initiation OT Short Term Goal 2 (Week 1): Pt will complete UB dressing with mod assist with mod verbal cues for initiation OT Short Term Goal 3 (Week 1): Pt will complete LB bathing with max assist with mod verbal cues for initiation OT Short Term Goal 4 (Week 1): Pt will complete toilet transfer with max assist with mod verbal cues for initiation  Skilled Therapeutic Interventions/Progress Updates:    Engaged in cognitive skills development with focus on sustained attention to task of self-feeding, following one step commands, basic problem solving, and engaging in brief interactions with wife and daughter.  Pt demonstrated mild agitation with complex directions or questions, however participation improved with functional one step commands.  Pt required max assist +2 for slideboard transfer secondary to increased apraxia with novel tasks.    Therapy Documentation Precautions:  Precautions Precautions: Fall Precaution Comments: motor apraxia, right hemiplegia, delayed initiation, right LE flexor withdrawl Required Braces or Orthoses: No Restrictions Weight Bearing Restrictions: No Other Position/Activity Restrictions: monitor O2 during mobility Pain: Pain Assessment Pain Assessment: No/denies pain  See FIM for current functional status  Therapy/Group: Individual Therapy  [name] 12/18/2011, 3:46 PM

## 2011-12-19 LAB — GLUCOSE, CAPILLARY
Glucose-Capillary: 175 mg/dL — ABNORMAL HIGH (ref 70–99)
Glucose-Capillary: 219 mg/dL — ABNORMAL HIGH (ref 70–99)

## 2011-12-19 MED ORDER — INSULIN GLARGINE 100 UNIT/ML ~~LOC~~ SOLN
18.0000 [IU] | Freq: Every day | SUBCUTANEOUS | Status: DC
  Administered 2011-12-19: 18 [IU] via SUBCUTANEOUS
  Filled 2011-12-19: qty 3

## 2011-12-19 NOTE — Progress Notes (Signed)
Patient ID: Darrell [name], male   DOB: [birth year], 66 y.o.   MRN: [id number] Subjective/Complaints Admits to being frustrated today. Tearful at times. :Review of Systems  Neurological: Positive for focal weakness.  Psychiatric/Behavioral: Positive for depression.       Adjustment d/o  All other systems reviewed and are negative.    Objective: Vital Signs: Blood pressure 149/90, pulse 79, temperature 98 F (36.7 C), temperature source Axillary, resp. rate 20, weight 227 lb 11.8 oz (103.3 kg), SpO2 91.00%. No results found. Results for orders placed during the hospital encounter of 12/14/11 (from the past 72 hour(s))  GLUCOSE, CAPILLARY     Status: Abnormal   Collection Time   12/16/11 11:06 AM      Component Value Range Comment   Glucose-Capillary 224 (*) 70 - 99 (mg/dL)    Comment 1 Notify RN     GLUCOSE, CAPILLARY     Status: Abnormal   Collection Time   12/16/11  4:08 PM      Component Value Range Comment   Glucose-Capillary 175 (*) 70 - 99 (mg/dL)    Comment 1 Notify RN     GLUCOSE, CAPILLARY     Status: Abnormal   Collection Time   12/16/11  9:19 PM      Component Value Range Comment   Glucose-Capillary 176 (*) 70 - 99 (mg/dL)    Comment 1 Notify RN     GLUCOSE, CAPILLARY     Status: Abnormal   Collection Time   12/17/11 11:56 AM      Component Value Range Comment   Glucose-Capillary 230 (*) 70 - 99 (mg/dL)   GLUCOSE, CAPILLARY     Status: Abnormal   Collection Time   12/17/11  3:59 PM      Component Value Range Comment   Glucose-Capillary 184 (*) 70 - 99 (mg/dL)    Comment 1 Notify RN     GLUCOSE, CAPILLARY     Status: Abnormal   Collection Time   12/17/11  9:20 PM      Component Value Range Comment   Glucose-Capillary 190 (*) 70 - 99 (mg/dL)    Comment 1 Notify RN     GLUCOSE, CAPILLARY     Status: Abnormal   Collection Time   12/18/11  7:36 AM      Component Value Range Comment   Glucose-Capillary 188 (*) 70 - 99 (mg/dL)   GLUCOSE, CAPILLARY     Status:  Abnormal   Collection Time   12/18/11 11:43 AM      Component Value Range Comment   Glucose-Capillary 194 (*) 70 - 99 (mg/dL)   GLUCOSE, CAPILLARY     Status: Abnormal   Collection Time   12/18/11  4:48 PM      Component Value Range Comment   Glucose-Capillary 287 (*) 70 - 99 (mg/dL)    Comment 1 Notify RN     GLUCOSE, CAPILLARY     Status: Abnormal   Collection Time   12/18/11  9:08 PM      Component Value Range Comment   Glucose-Capillary 164 (*) 70 - 99 (mg/dL)   GLUCOSE, CAPILLARY     Status: Abnormal   Collection Time   12/18/11  9:35 PM      Component Value Range Comment   Glucose-Capillary 175 (*) 70 - 99 (mg/dL)    Comment 1 Notify RN     GLUCOSE, CAPILLARY     Status: Abnormal   Collection Time   12/19/11  6:00  AM      Component Value Range Comment   Glucose-Capillary 175 (*) 70 - 99 (mg/dL)    Comment 1 Notify RN     GLUCOSE, CAPILLARY     Status: Abnormal   Collection Time   12/19/11  8:01 AM      Component Value Range Comment   Glucose-Capillary 197 (*) 70 - 99 (mg/dL)    Comment 1 Notify RN       Physical Exam  Well-developed, overweight male in no acute distress. Appears well. HEENT exam atraumatic, normocephalic symmetric her muscles are intact. Neck is supple. Chest clear to auscultation cardiac exam S1-S2 are regular. Abdominal exam bowel sounds, soft. Extremities no edema. Assessment/Plan: 1. 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 Physiatrist is providing close team supervision and 24 hour management of active medical problems listed below. Physiatrist and rehab team continue to assess barriers to discharge/monitor patient progress toward functional and medical goals. FIM: FIM - Bathing Bathing Steps Patient Completed: Chest;Abdomen;Left upper leg Bathing: 1: Two helpers (pt assisted with all activities - 2  helpers to stand)  FIM - Upper Body Dressing/Undressing Upper body dressing/undressing steps patient completed: Thread/unthread left sleeve of pullover shirt/dress (pt assisted with over head and pulling shirt down) Upper body dressing/undressing: 1: Total-Patient completed less than 25% of tasks FIM - Lower Body Dressing/Undressing Lower body dressing/undressing: 1: Two helpers (pt assisted with all activities)  FIM - Toileting Toileting: 0: Activity did not occur  FIM - Archivist Transfers: 0-Activity did not occur Toilet Transfers DO NOT USE: 0-Activity did not occur  FIM - Banker Devices: Sliding board Bed/Chair Transfer: 2: Supine > Sit: Max A (lifting assist/Pt. 25-49%);1: Two helpers  FIM - Locomotion: Wheelchair Distance: 25 Locomotion: Wheelchair: 1: Travels less than 50 ft with moderate assistance (Pt: 50 - 74%) FIM - Locomotion: Ambulation Locomotion: Ambulation: 0: Activity did not occur  Comprehension Comprehension Mode: Auditory;Visual Comprehension: 5-Follows basic conversation/direction: With no assist  Expression Expression Mode: Verbal Expression: 5-Expresses basic needs/ideas: With no assist  Social Interaction Social Interaction: 2-Interacts appropriately 25 - 49% of time - Needs frequent redirection.  Problem Solving Problem Solving: 2-Solves basic 25 - 49% of the time - needs direction more than half the time to initiate, plan or complete simple activities  Memory Memory: 3-Recognizes or recalls 50 - 74% of the time/requires cueing 25 - 49% of the time  2. Anticoagulation/DVT prophylaxis with Pharmaceutical: Lovenox Medical Problem List and Plan:  1. DVT Prophylaxis/Anticoagulation: Mechanical: Antiembolism stockings, knee (TED hose) Bilateral lower extremities  Sequential compression devices, below knee Bilateral lower extremities. Consider Lovenox  2. Pain Management: Utilize Tylenol  generously as well as ice and heat. Can use hydrocodone for more significant pain. His pain is better controlled. He denies any pain today.  3. Mood: Celexa. Team wanted to provide ego support as patient does appear depressed. Depression screening per social work.  4. HTN: resume low dose ace. Good control overall. 5. DM: continue CBG checks.On tube feeds with elevated BS. Resume metformin and use SSI BS control. Increase Lantus--to 18 units today 6. Insomnia: When necessary trazodone. Follow sleep patterns.  7. Leukocytosis: Reactive? Monitor for fevers LOS (Days) 5 A FACE TO FACE EVALUATION WAS PERFORMED  Brayln Duque HENRY 12/19/2011, 8:13 AM

## 2011-12-19 NOTE — Progress Notes (Signed)
Physical Therapy Session Note  Patient Details  Name: Darrell [name] MRN: [id number] Date of Birth: 13-Dec-1945  Today's Date: 12/19/2011 Time: 1105-1200 Time Calculation (min): 55 min  Short Term Goals: Week 1:   see eval  Skilled Therapeutic Interventions/Progress Updates:    Therapeutic activity after passive stretch, working on pt initiating, managing own RUE, improving mobility, cognitive rehab for awareness, problem solving, selective attention, memory.  Therapy Documentation Precautions:  Precautions Precautions: Fall Precaution Comments: motor apraxia, right hemiplegia, delayed initiation, right LE flexor withdrawl Required Braces or Orthoses: No Restrictions Weight Bearing Restrictions: No Other Position/Activity Restrictions: monitor O2 during mobility General: poor intellectual awareness but able to choose stroke from 3 choices, unable to accurately recall profession from choice of 2, constant instruction to keep eyes open- likely related to fatigue but also pt c/o not being able to see; encouragement for participation required, very slow to initate any active movement    Pain: Pain Assessment Pain Assessment: No/denies pain Pain Score:   9 Mobility: Bed Mobility Rolling Right: 4: Min assist;With rail Rolling Right Details: Visual cues/gestures for sequencing;Verbal cues for sequencing;Verbal cues for technique;Verbal cues for safe use of DME/AE Rolling Left: 1: +2 Total assist Rolling Left Details: Verbal cues for technique;Verbal cues for precautions/safety Supine to Sit: 1: +2 Total assist Supine to Sit Details: Verbal cues for technique;Manual facilitation for weight bearing Sitting - Scoot to Edge of Bed: 3: Mod assist Transfers Lateral/Scoot Transfers: 1: +2 Total assist;With slide board (pt + 30%) Locomotion : Naval architect Mobility: Yes Wheelchair Assistance: 5: Supervision Wheelchair Assistance Details: Manual facilitation for  placement;Verbal cues for technique Wheelchair Propulsion: Left lower extremity;Left upper extremity Wheelchair Parts Management: Needs assistance Distance: 33 ft in 1 min 40 sec with constant cues to open eyes  T  Sat EOB S in preparation for transfer Stood in standing frame approx 5 minutes for weight bearing and postural control, able to wipe mirror x 1 but used L hand and not cloth he held in his hand, R foot moved back and off ground x 1   Exercises:  : passive stretch to BLE to increase ROM for mobility See FIM for current functional status  Therapy/Group: Individual Therapy  Michaelene Song 12/19/2011, 12:05 PM

## 2011-12-19 NOTE — Progress Notes (Addendum)
Speech Language Pathology Daily Session Note  Patient Details  Name: TAIM WURM MRN: [id number] Date of Birth: 1945/11/11  Today's Date: 12/19/2011 Time: 0815-0855 Time Calculation (min): 40 min  Short Term Goals: Week 1: SLP Short Term Goal 1 (Week 1): Patient will sustain attention in basic ADL task for 2-3 minutes with maximum verbal/tactile/contextual cues. SLP Short Term Goal 2 (Week 1): Patient will follow basic 1 step commands in familiar, functional ADL task with maximum verbal/tactile/contextual cues. SLP Short Term Goal 3 (Week 1): Patient will verbalize basic wants/needs 75% of the time with maximum semantic cues. SLP Short Term Goal 4 (Week 1): Patient will verbalize automatic/biographical information at 2-3 word level with moderate semantic or sentence completion cues. SLP Short Term Goal 5 (Week 1): Patient will solve basic problems in automatic, rote, familiar ADL tasks with maximum verbal/tactile/contextual cues.  Skilled Therapeutic Interventions:  Goal of session focused on dysphagia/communication/cognition.  Pt.  required maxl assist/encouragment to participate in therapy.  He appeared in very low spirits/mood this am, repeatedly stating "I just can't", "can't believe this" as well as "I just want to die, just let me die" and crying throughout.  He refused po's initially but agreed and consumed approximately 4 bites magic cup sherbert and several sips OJ with indications of decreased airway protection marked by intermittent wet vocal quality (present prior to po's) and cough x 2.  SLP attempted 1 step directions via written info (reading comprehension) but pt. unable to attend due to internal distractions over his current situation as well as pain which gradually increased throughout session ("please help me"). Max verbal cues to problem solve obtaining pain meds for headache and decreased attention as he repeatedly requested meds after being told RN was bringing them  multiple times.  Pt.'s verbalizations were primarily at the phrase level with several instances of sentence length with anomia present ("Do you think I could have something for ___").  Pt. progress appears to be significantly limited by psychological/emotional issues relating to his current function post CVA.  Discussed status with pt.'s daughter, Marcelino Duster, who is very concerned with pt.'s poor intake.  Prior to this event, she reported he would not eat any fruits or vegetables and was very particular of what he would eat.  Recommend assessing pt.'s ability, at bedside, for safety and efficiency of increased textures (continue nectar until repeat MBS performed).  Pt.'s wife made a list of foods he likes.  If diet able to be advanced, also recommend dietitian for food preferences.   Daily Session Precautions/Restrictions  Precautions Precautions: Fall FIM:  Comprehension Comprehension Mode: Auditory Comprehension: 2-Understands basic 25 - 49% of the time/requires cueing 51 - 75% of the time Expression Expression Mode: Verbal Expression: 2-Expresses basic 25 - 49% of the time/requires cueing 50 - 75% of the time. Uses single words/gestures. Social Interaction Social Interaction: 1-Interacts appropriately less than 25% of the time. May be withdrawn or combative. Problem Solving Problem Solving: 1-Solves basic less than 25% of the time - needs direction nearly all the time or does not effectively solve problems and may need a restraint for safety Memory Memory: 1-Recognizes or recalls less than 25% of the time/requires cueing greater than 75% of the time FIM - Eating Eating Activity: 4: Help with picking up utensils;4: Helper occasionally brings food to mouth General    Pain Pain Assessment Pain Assessment: No/denies pain  Therapy/Group: Individual Therapy  Breck Coons [place].Ed [employer] [phone]

## 2011-12-19 NOTE — Progress Notes (Signed)
Occupational Therapy Session Note  Patient Details  Name: Darrell [name] MRN: [id number] Date of Birth: 10-Dec-1945  Today's Date: 12/19/2011 Time: [phone] and [phone] Time Calculation (min): 25 min and 45 min  Short Term Goals: Week 1:  OT Short Term Goal 1 (Week 1): Pt will complete UB bathing with mod assist with mod verbal cues for initiation OT Short Term Goal 2 (Week 1): Pt will complete UB dressing with mod assist with mod verbal cues for initiation OT Short Term Goal 3 (Week 1): Pt will complete LB bathing with max assist with mod verbal cues for initiation OT Short Term Goal 4 (Week 1): Pt will complete toilet transfer with max assist with mod verbal cues for initiation  Skilled Therapeutic Interventions/Progress Updates:    Attempted to engage in self-care tasks with bathing and dressing.  Pt with increased frustration this session and increased difficulty to decrease agitation.  Pt supine to sit with max assist +2 with second person to control BLE.  Pt demonstrated sitting balance EOB with occasional min assist while taking pain meds with RN.  Pt with occasional redirection, but insistent on returning to bed.  Pt with increased awareness at end of session with apologizing for yelling at this therapist and stating that was not appropriate.  Pt again admitted that he was more frustrated today.  Session 2:  Engaged in cognitive skills development with focus on orientation to place and situation with questioning cues to promote correct response.  Pt able to recall situation with some question clues but continues to require increased assist with location.  Focus on self-feeding and education on stroke recovery.  Pt with questions regarding why he is so tired all the time.  NM re-ed of RUE with PROM at shoulder with flexion/extension and internal/external rotation and elbow and wrist flexion/extension.  Pt reports "that feels good" with PROM.  Pt with increased sensation in RUE with  ability to identify location of touch with questioning cues.  Therapy Documentation Precautions:  Precautions Precautions: Fall Precaution Comments: motor apraxia, right hemiplegia, delayed initiation, right LE flexor withdrawl Required Braces or Orthoses: No Restrictions Weight Bearing Restrictions: No Other Position/Activity Restrictions: monitor O2 during mobility General: General Amount of Missed OT Time (min): 20 Minutes (increased agitation, refusal) Vital Signs: Therapy Vitals Temp: 98 F (36.7 C) Temp src: Axillary Pulse Rate: 79  Resp: 20  BP: 149/90 mmHg Patient Position, if appropriate: Lying Oxygen Therapy SpO2: 91 % O2 Device: None (Room air) Pain: Pain Assessment Pain Score:   9 No c/o pain in second session  See FIM for current functional status  Therapy/Group: Individual Therapy  [name] 12/19/2011, 9:38 AM

## 2011-12-20 LAB — GLUCOSE, CAPILLARY
Glucose-Capillary: 197 mg/dL — ABNORMAL HIGH (ref 70–99)
Glucose-Capillary: 272 mg/dL — ABNORMAL HIGH (ref 70–99)
Glucose-Capillary: 251 mg/dL — ABNORMAL HIGH (ref 70–99)

## 2011-12-20 MED ORDER — INSULIN GLARGINE 100 UNIT/ML ~~LOC~~ SOLN
21.0000 [IU] | Freq: Every day | SUBCUTANEOUS | Status: DC
  Administered 2011-12-20 – 2011-12-21 (×2): 21 [IU] via SUBCUTANEOUS

## 2011-12-20 NOTE — Progress Notes (Signed)
Patient ID: Darrell [name], male   DOB: August 18, 1946, 66 y.o.   MRN: [id number] Subjective/Complaints Mood is better today. No specific complaints. :Review of Systems  Neurological: Positive for focal weakness.  Psychiatric/Behavioral: Positive for depression.       Adjustment d/o  All other systems reviewed and are negative.    Objective: Vital Signs: Blood pressure 137/88, pulse 99, temperature 98.5 F (36.9 C), temperature source Oral, resp. rate 18, weight 227 lb 11.8 oz (103.3 kg), SpO2 99.00%. No results found. Results for orders placed during the hospital encounter of 12/14/11 (from the past 72 hour(s))  GLUCOSE, CAPILLARY     Status: Abnormal   Collection Time   12/17/11 11:56 AM      Component Value Range Comment   Glucose-Capillary 230 (*) 70 - 99 (mg/dL)   GLUCOSE, CAPILLARY     Status: Abnormal   Collection Time   12/17/11  3:59 PM      Component Value Range Comment   Glucose-Capillary 184 (*) 70 - 99 (mg/dL)    Comment 1 Notify RN     GLUCOSE, CAPILLARY     Status: Abnormal   Collection Time   12/17/11  9:20 PM      Component Value Range Comment   Glucose-Capillary 190 (*) 70 - 99 (mg/dL)    Comment 1 Notify RN     GLUCOSE, CAPILLARY     Status: Abnormal   Collection Time   12/18/11  7:36 AM      Component Value Range Comment   Glucose-Capillary 188 (*) 70 - 99 (mg/dL)   GLUCOSE, CAPILLARY     Status: Abnormal   Collection Time   12/18/11 11:43 AM      Component Value Range Comment   Glucose-Capillary 194 (*) 70 - 99 (mg/dL)   GLUCOSE, CAPILLARY     Status: Abnormal   Collection Time   12/18/11  4:48 PM      Component Value Range Comment   Glucose-Capillary 287 (*) 70 - 99 (mg/dL)    Comment 1 Notify RN     GLUCOSE, CAPILLARY     Status: Abnormal   Collection Time   12/18/11  9:08 PM      Component Value Range Comment   Glucose-Capillary 164 (*) 70 - 99 (mg/dL)   GLUCOSE, CAPILLARY     Status: Abnormal   Collection Time   12/18/11  9:35 PM      Component  Value Range Comment   Glucose-Capillary 175 (*) 70 - 99 (mg/dL)    Comment 1 Notify RN     GLUCOSE, CAPILLARY     Status: Abnormal   Collection Time   12/19/11  6:00 AM      Component Value Range Comment   Glucose-Capillary 175 (*) 70 - 99 (mg/dL)    Comment 1 Notify RN     GLUCOSE, CAPILLARY     Status: Abnormal   Collection Time   12/19/11  8:01 AM      Component Value Range Comment   Glucose-Capillary 197 (*) 70 - 99 (mg/dL)    Comment 1 Notify RN     GLUCOSE, CAPILLARY     Status: Abnormal   Collection Time   12/19/11 12:08 PM      Component Value Range Comment   Glucose-Capillary 219 (*) 70 - 99 (mg/dL)   GLUCOSE, CAPILLARY     Status: Abnormal   Collection Time   12/19/11  5:14 PM      Component Value Range Comment  Glucose-Capillary 173 (*) 70 - 99 (mg/dL)    Comment 1 Notify RN     GLUCOSE, CAPILLARY     Status: Abnormal   Collection Time   12/19/11  9:39 PM      Component Value Range Comment   Glucose-Capillary 264 (*) 70 - 99 (mg/dL)   GLUCOSE, CAPILLARY     Status: Abnormal   Collection Time   12/20/11  7:44 AM      Component Value Range Comment   Glucose-Capillary 197 (*) 70 - 99 (mg/dL)    Comment 1 Notify RN       Physical Exam  Well-developed, overweight male in no acute distress. Appears well. HEENT exam atraumatic, normocephalic symmetric her muscles are intact. Neck is supple. Chest clear to auscultation cardiac exam S1-S2 are regular. Abdominal exam bowel sounds, soft. Extremities no edema. Assessment/Plan: 1. 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 Physiatrist is providing close team supervision and 24 hour management of active medical problems listed below. Physiatrist and rehab team continue to assess barriers to discharge/monitor patient progress toward functional and medical goals. FIM: FIM -  Bathing Bathing Steps Patient Completed: Chest;Abdomen;Left upper leg Bathing: 1: Two helpers (pt assisted with all activities - 2 helpers to stand)  FIM - Upper Body Dressing/Undressing Upper body dressing/undressing steps patient completed: Thread/unthread left sleeve of pullover shirt/dress (pt assisted with over head and pulling shirt down) Upper body dressing/undressing: 1: Total-Patient completed less than 25% of tasks FIM - Lower Body Dressing/Undressing Lower body dressing/undressing: 1: Two helpers (pt assisted with all activities)  FIM - Toileting Toileting: 0: Activity did not occur  FIM - Archivist Transfers: 0-Activity did not occur Toilet Transfers DO NOT USE: 0-Activity did not occur  FIM - Banker Devices: Sliding board Bed/Chair Transfer: 1: Two helpers  FIM - Locomotion: Wheelchair Distance: 40 Locomotion: Wheelchair: 1: Travels less than 50 ft with supervision, cueing or coaxing FIM - Locomotion: Ambulation Locomotion: Ambulation: 0: Activity did not occur  Comprehension Comprehension Mode: Auditory Comprehension: 2-Understands basic 25 - 49% of the time/requires cueing 51 - 75% of the time  Expression Expression Mode: Verbal Expression: 2-Expresses basic 25 - 49% of the time/requires cueing 50 - 75% of the time. Uses single words/gestures.  Social Interaction Social Interaction: 1-Interacts appropriately less than 25% of the time. May be withdrawn or combative.  Problem Solving Problem Solving: 1-Solves basic less than 25% of the time - needs direction nearly all the time or does not effectively solve problems and may need a restraint for safety  Memory Memory: 1-Recognizes or recalls less than 25% of the time/requires cueing greater than 75% of the time  2. Anticoagulation/DVT prophylaxis with Pharmaceutical: Lovenox Medical Problem List and Plan:  1. DVT Prophylaxis/Anticoagulation: Mechanical:  Antiembolism stockings, knee (TED hose) Bilateral lower extremities  Sequential compression devices, below knee Bilateral lower extremities. Consider Lovenox  2. Pain Management: Utilize Tylenol generously as well as ice and heat. Can use hydrocodone for more significant pain. His pain is better controlled. He denies any pain today.  3. Mood: Celexa. Team wanted to provide ego support as patient does appear depressed. Depression screening per social work.  4. HTN: resume low dose ace. Good control overall.  BP Readings from Last 3 Encounters:  12/20/11 137/88  12/14/11 147/89  12/14/11 147/89    5. DM: continue CBG  checks.On tube feeds with elevated BS. Resume metformin and use SSI BS control. Increase Lantus--to 21 units today 6. Insomnia: When necessary trazodone. Follow sleep patterns.  7. Leukocytosis: Reactive? Monitor for fevers LOS (Days) 6 A FACE TO FACE EVALUATION WAS PERFORMED  Tali Coster HENRY 12/20/2011, 8:05 AM

## 2011-12-20 NOTE — Progress Notes (Signed)
Physical Therapy Session Note  Patient Details  Name: Darrell [name] MRN: [id number] Date of Birth: 1946-06-08  Today's Date: 12/20/2011 Time: 7829-5621 Time Calculation (min): 20 min  S:  Once on mat pt indicated that he needed to have a BM, treatment was stopped to return pt to bed, secondary to at a bedpan level for toileting.  Skilled Therapeutic Interventions/Progress Updates:  Initiated getting patient to tell the therapist how to assist with sliding board transfers.  Pt unable to direct, indicated that sliding board should go on his stomach and then once board was placed he pulled it out from underneath his thigh.    Therapy Documentation Precautions:  Precautions Precautions: Fall Precaution Comments: motor apraxia, right hemiplegia, delayed initiation, right LE flexor withdrawl Required Braces or Orthoses: No Restrictions Weight Bearing Restrictions: No Other Position/Activity Restrictions: monitor O2 during mobility General: Amount of Missed PT Time (min): 25 Minutes Missed Time Reason: Other (comment) (Treatment stopped for toileting on bed pan) Pain: Pain Assessment Pain Assessment: No/denies pain Pain Score: 0-No pain Mobility: Transfers Transfers: Yes Lateral/Scoot Transfers: 1: +2 Total assist Lateral/Scoot Transfer Details: Manual facilitation for placement;Manual facilitation for weight shifting Lateral/Scoot Transfer Details (indicate cue type and reason): minimal inititation of pt to assist with sliding board transfers to and from the mat from w/c and w/c to bed. See FIM for current functional status  Therapy/Group: Individual Therapy  Georges Mouse 12/20/2011, 11:44 AM

## 2011-12-20 NOTE — Progress Notes (Signed)
PT has been refusing CPAP; there is not even a machine in his room. RT will continue to monitor.

## 2011-12-21 ENCOUNTER — Ambulatory Visit: Payer: Medicare Other | Admitting: Family Medicine

## 2011-12-21 LAB — GLUCOSE, CAPILLARY: Glucose-Capillary: 180 mg/dL — ABNORMAL HIGH (ref 70–99)

## 2011-12-21 MED ORDER — TIZANIDINE HCL 2 MG PO TABS
2.0000 mg | ORAL_TABLET | Freq: Three times a day (TID) | ORAL | Status: DC
  Administered 2011-12-21 – 2011-12-24 (×10): 2 mg via ORAL
  Filled 2011-12-21 (×13): qty 1

## 2011-12-21 NOTE — Progress Notes (Signed)
Occupational Therapy Session Note  Patient Details  Name: Darrell [name] MRN: [id number] Date of Birth: 1946/08/05  Today's Date: 12/21/2011 Time: 1130-1200 Time Calculation (min): 30 min  Short Term Goals: Week 1:  OT Short Term Goal 1 (Week 1): Pt will complete UB bathing with mod assist with mod verbal cues for initiation OT Short Term Goal 2 (Week 1): Pt will complete UB dressing with mod assist with mod verbal cues for initiation OT Short Term Goal 3 (Week 1): Pt will complete LB bathing with max assist with mod verbal cues for initiation OT Short Term Goal 4 (Week 1): Pt will complete toilet transfer with max assist with mod verbal cues for initiation  Skilled Therapeutic Interventions/Progress Updates:  Self care retraining to include bath and dress in w/c at sink.  Focus session on sustained attention to basic functional tasks, initiation, following 1 step commands, engage cognitively in terms of insight, orientation, and basic problem solving, activity tolerance, sit >< stand, management of RUE, and standing balance.  Precautions:  Precautions Precautions: Fall Precaution Comments: motor apraxia, right hemiplegia, delayed initiation, right LE flexor withdrawl Required Braces or Orthoses: No Restrictions Weight Bearing Restrictions: No Other Position/Activity Restrictions: monitor O2 during mobility Pain: No c/o pain  See FIM for current functional status  Therapy/Group: Co-Treatment (1 hour with OT)  Juna Caban 12/21/2011, 4:26 PM

## 2011-12-21 NOTE — Progress Notes (Signed)
Physical Therapy Session Note  Patient Details  Name: Darrell [name] MRN: [id number] Date of Birth: 1945/12/16  Today's Date: 12/21/2011 Time: 1500-1530 Time Calculation (min): 30 min  Short Term Goals: Week 2:  PT Short Term Goal 1 (Week 2): Pt will transfer supine to sit with max A 2/3 trials with cues (to R or left, whichever pt is more I at) PT Short Term Goal 2 (Week 2): Pt will transfer bed to/from w/c with max A pt >=40% 2/3 trials with cues PT Short Term Goal 3 (Week 2): Pt will particiapte in seated  balance activity 6 minutes without LOB to demonstrate improved activity tolerance and attention to task PT Short Term Goal 4 (Week 2): Pt stand with total A +2 pt =25% PT Short Term Goal 5 (Week 2): Pt will propel w/c in controlled evironment 60 ft with min A with mod cues  Therapy Documentation Precautions:  Precautions Precautions: Fall Precaution Comments: motor apraxia, right hemiplegia, delayed initiation, right LE flexor withdrawl Required Braces or Orthoses: No Restrictions Weight Bearing Restrictions: No Other Position/Activity Restrictions: monitor O2 during mobility Pain: Pain Assessment Pain Assessment: No/denies pain  Other Treatments: Treatments Therapeutic Activity: Patient reporting that he had an incontinent urine episode; performed bed mobility training with use of changing wet > dry brief as motivation to roll L and R and bridge with bilat LE; patient required total verbal cues for sequence, and basic, functional verbal cues and extra time to initiate LE flexion, bridging, scooting hips L and R, upper and lower trunk rotation.  Patient required mod-max A to perform each part of bed mobility; patient became tearful and frustrated when attempted to roll to L; offered patient encouragement and use of counting for initiation.    See FIM for current functional status  Therapy/Group: Individual Therapy  Edman Circle [facility] 12/21/2011, 5:12 PM

## 2011-12-21 NOTE — Progress Notes (Signed)
Per State Regulation 482.30 This chart was reviewed for medical necessity with respect to the patient's Admission/Duration of stay. Pt participating in therapies.Pt remains at max-total A. CPAP ordered.  MBS ordered. Diet upgraded to D-2. Await SNF placement. Blood glucose uncontrolled. Meryl Dare                 Nurse Care Manager          Next Review Date: 12/25/11

## 2011-12-21 NOTE — Progress Notes (Signed)
Speech Language Pathology Daily Session Note  Patient Details  Name: Darrell [name] MRN: [id number] Date of Birth: 29-Apr-1946  Today's Date: 12/21/2011 Time: 0800-0900 Time Calculation (min): 60 min  Short Term Goals: Week 1: SLP Short Term Goal 1 (Week 1): Patient will sustain attention in basic ADL task for 2-3 minutes with maximum verbal/tactile/contextual cues. SLP Short Term Goal 2 (Week 1): Patient will follow basic 1 step commands in familiar, functional ADL task with maximum verbal/tactile/contextual cues. SLP Short Term Goal 3 (Week 1): Patient will verbalize basic wants/needs 75% of the time with maximum semantic cues. SLP Short Term Goal 4 (Week 1): Patient will verbalize automatic/biographical information at 2-3 word level with moderate semantic or sentence completion cues. SLP Short Term Goal 5 (Week 1): Patient will solve basic problems in automatic, rote, familiar ADL tasks with maximum verbal/tactile/contextual cues.  Skilled Therapeutic Interventions: Session focused on facilitating communication and dysphagia treatment.  Patient with increased positivity today, very thankful toward therapist; increased expression of wants and needs being able to verbalize presence of pain; however unable to verbally describe where and unable to show, presence of some sematic errors "please take off my shoes" (shoes for socks).  Patient consumed 12oz Nectar-thick liquids with no overt s/s of aspiration with large single sips; 6oz of thin liquids via cup resulted in a delayed cough; Dys.2 texture trials resulted in timely, effective mastication; Dys.3 texture trial resulted in cough suspected to be because of crumbs.    Recommend: Today diet ugprade to Dys.2 textures with continued full staff supervision; Objective assessment 12/22/11 to assess for silent penetration/aspration per previous MBSS and trial in [employer]  Daily Session FIM:  Comprehension Comprehension Mode:  Auditory Comprehension: 2-Understands basic 25 - 49% of the time/requires cueing 51 - 75% of the time Expression Expression Mode: Verbal Expression: 2-Expresses basic 25 - 49% of the time/requires cueing 50 - 75% of the time. Uses single words/gestures. Social Interaction Social Interaction: 4-Interacts appropriately 75 - 89% of the time - Needs redirection for appropriate language or to initiate interaction. Problem Solving Problem Solving: 2-Solves basic 25 - 49% of the time - needs direction more than half the time to initiate, plan or complete simple activities Memory Memory: 2-Recognizes or recalls 25 - 49% of the time/requires cueing 51 - 75% of the time FIM - Eating Eating Activity: 4: Help with picking up utensils Pain Pain Assessment Pain Assessment: No/denies pain Pain Score: 0-No pain  Therapy/Group: Individual Therapy  Charlane Ferretti., CCC-SLP [phone]  [name] 12/21/2011, 9:23 AM

## 2011-12-21 NOTE — Progress Notes (Signed)
Occupational Therapy Session Note  Patient Details  Name: CLOVIS MANKINS MRN: [id number] Date of Birth: [birth year]  Today's Date: 12/21/2011 Time: 1100-1130 Time Calculation (min): 30 min  Short Term Goals: Week 1:  OT Short Term Goal 1 (Week 1): Pt will complete UB bathing with mod assist with mod verbal cues for initiation OT Short Term Goal 2 (Week 1): Pt will complete UB dressing with mod assist with mod verbal cues for initiation OT Short Term Goal 3 (Week 1): Pt will complete LB bathing with max assist with mod verbal cues for initiation OT Short Term Goal 4 (Week 1): Pt will complete toilet transfer with max assist with mod verbal cues for initiation  Skilled Therapeutic Interventions/Progress Updates:    Pt seen for ADL retraining with focus on increased initiation and sit to stand to complete LB bathing and dressing.   Focus on keeping session functional and one-step directions.  Pt with increased ability to follow one-step directions with occasional setup to initiate task.  Pt continues to require questioning cues for orientation to place, situation, and time.  Pt demonstrated increased initiation with bathing UB this session with min verbal cues.  Therapy Documentation Precautions:  Precautions Precautions: Fall Precaution Comments: motor apraxia, right hemiplegia, delayed initiation, right LE flexor withdrawl Required Braces or Orthoses: No Restrictions Weight Bearing Restrictions: No Other Position/Activity Restrictions: monitor O2 during mobility Pain: Pain Assessment Pain Assessment: No/denies pain Pain Score: 0-No pain  See FIM for current functional status  Therapy/Group: Individual Therapy  [name] 12/21/2011, 12:30 PM

## 2011-12-21 NOTE — Progress Notes (Signed)
Physical Therapy Weekly Progress Note  Patient Details  Name: Darrell [name] MRN: [id number] Date of Birth: [birth year]  Today's Date: 12/21/2011  Patient has met 1 of 5 short term goals.  Pt is progressing towards the others but standing has not been concentrated on d/t current functional level and activity tolerance.  Pt able to roll R with min A but L requires max A to +2 A with total A to initiate use of normal movement patterns d/t R hemiplegia, inattention to R body and apraxia. Pt transfers have progressed from consistently +2 A with pt actively participating little with slide board to inconsistent but improved transfer with pt performing >% and if encouraged with thought of "a break", pt even more active. Sitting balance has substantially improved and is more consistent, going from +2 for even static balance to close S static and for minimal weight shifts.   Pt unable to instruct caregiver on any of his care at this time.  Patient continues to demonstrate the following deficits:hemiplegia and weakness in R extremiities and trunk, poor activity tolerance, severe cognitive impairments, decreased postural control and balance, apraxia (limits use of even L side), delayed initiation, inattention to R body and therefore will continue to benefit from skilled PT intervention to enhance overall performance with activity tolerance, balance, postural control, ability to compensate for deficits, functional use of  right upper extremity, right lower extremity, left upper extremity and left lower extremity, attention and awareness.  Patient will monitor progress and need for goal revision.  Continue plan of care.  PT Short Term Goals Week 1:   see eval for week 1 STG and new STG for week 2  Michaelene Song 12/21/2011, 3:17 PM

## 2011-12-21 NOTE — Progress Notes (Addendum)
Inpatient Diabetes Program Recommendations  AACE/ADA: New Consensus Statement on Inpatient Glycemic Control (2009)  Target Ranges:  Prepandial:   less than 140 mg/dL      Peak postprandial:   less than 180 mg/dL (1-2 hours)      Critically ill patients:  140 - 180 mg/dL   Reason for Visit: Hyperglcycemia  Inpatient Diabetes Program Recommendations Insulin - Basal: Noted increase in Lantus to 24 units, however, cbg this am at 180 mg/dL.  Would continue to increase by 5  units until fasting cbg is less than or equal to 150 mg/dL maxiumum. Insulin - Meal Coverage: May need meal coverage as well, but will need to re-evaluate once fastings are controlled. Noted PO intake documented as 5-10%; but pt is getting supplements of Pro-stat and Ensure.  Note: Thank you, Lenor Coffin, RN, CNS, Diabetes Coordinator [phone])

## 2011-12-21 NOTE — Progress Notes (Signed)
Patient ID: Darrell [name], male   DOB: 1946-05-03, 66 y.o.   MRN: [id number] Subjective/Complaints Awake but continues to be disoriented.PT notes flexor withdrawal RLE :Review of Systems  Neurological: Positive for focal weakness.  Psychiatric/Behavioral: Positive for depression.       Adjustment d/o  All other systems reviewed and are negative.    Objective: Vital Signs: Blood pressure 120/71, pulse 80, temperature 98.1 F (36.7 C), temperature source Oral, resp. rate 19, weight 99.8 kg (220 lb 0.3 oz), SpO2 96.00%. No results found. Results for orders placed during the hospital encounter of 12/14/11 (from the past 72 hour(s))  GLUCOSE, CAPILLARY     Status: Abnormal   Collection Time   12/18/11  7:36 AM      Component Value Range Comment   Glucose-Capillary 188 (*) 70 - 99 (mg/dL)   GLUCOSE, CAPILLARY     Status: Abnormal   Collection Time   12/18/11 11:43 AM      Component Value Range Comment   Glucose-Capillary 194 (*) 70 - 99 (mg/dL)   GLUCOSE, CAPILLARY     Status: Abnormal   Collection Time   12/18/11  4:48 PM      Component Value Range Comment   Glucose-Capillary 287 (*) 70 - 99 (mg/dL)    Comment 1 Notify RN     GLUCOSE, CAPILLARY     Status: Abnormal   Collection Time   12/18/11  9:08 PM      Component Value Range Comment   Glucose-Capillary 164 (*) 70 - 99 (mg/dL)   GLUCOSE, CAPILLARY     Status: Abnormal   Collection Time   12/18/11  9:35 PM      Component Value Range Comment   Glucose-Capillary 175 (*) 70 - 99 (mg/dL)    Comment 1 Notify RN     GLUCOSE, CAPILLARY     Status: Abnormal   Collection Time   12/19/11  6:00 AM      Component Value Range Comment   Glucose-Capillary 175 (*) 70 - 99 (mg/dL)    Comment 1 Notify RN     GLUCOSE, CAPILLARY     Status: Abnormal   Collection Time   12/19/11  8:01 AM      Component Value Range Comment   Glucose-Capillary 197 (*) 70 - 99 (mg/dL)    Comment 1 Notify RN     GLUCOSE, CAPILLARY     Status: Abnormal   Collection Time   12/19/11 12:08 PM      Component Value Range Comment   Glucose-Capillary 219 (*) 70 - 99 (mg/dL)   GLUCOSE, CAPILLARY     Status: Abnormal   Collection Time   12/19/11  5:14 PM      Component Value Range Comment   Glucose-Capillary 173 (*) 70 - 99 (mg/dL)    Comment 1 Notify RN     GLUCOSE, CAPILLARY     Status: Abnormal   Collection Time   12/19/11  9:39 PM      Component Value Range Comment   Glucose-Capillary 264 (*) 70 - 99 (mg/dL)   GLUCOSE, CAPILLARY     Status: Abnormal   Collection Time   12/20/11  7:44 AM      Component Value Range Comment   Glucose-Capillary 197 (*) 70 - 99 (mg/dL)    Comment 1 Notify RN     GLUCOSE, CAPILLARY     Status: Abnormal   Collection Time   12/20/11 12:08 PM      Component Value  Range Comment   Glucose-Capillary 272 (*) 70 - 99 (mg/dL)    Comment 1 Notify RN     GLUCOSE, CAPILLARY     Status: Abnormal   Collection Time   12/20/11  4:57 PM      Component Value Range Comment   Glucose-Capillary 251 (*) 70 - 99 (mg/dL)    Comment 1 Notify RN     GLUCOSE, CAPILLARY     Status: Abnormal   Collection Time   12/20/11  8:38 PM      Component Value Range Comment   Glucose-Capillary 198 (*) 70 - 99 (mg/dL)     Gen:  NAD Mood/affect- no lability or aggitation Lung- Clear  Cor-RRR no Murmur Abd-soft NT to palpation Ext no C/C/E Motor: 0/5 in RUE, 2-/5 RLE Tone- increase in R finger flex, + R LE triple flexor but does have voluntary ext Assessment/Plan: 1. 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 Physiatrist is providing close team supervision and 24 hour management of active medical problems listed below. Physiatrist and rehab team continue to assess barriers to discharge/monitor patient progress toward functional and medical goals. FIM: FIM - Bathing Bathing Steps Patient  Completed: Chest;Abdomen;Left upper leg Bathing: 1: Two helpers (pt assisted with all activities - 2 helpers to stand)  FIM - Upper Body Dressing/Undressing Upper body dressing/undressing steps patient completed: Thread/unthread left sleeve of pullover shirt/dress (pt assisted with over head and pulling shirt down) Upper body dressing/undressing: 1: Total-Patient completed less than 25% of tasks FIM - Lower Body Dressing/Undressing Lower body dressing/undressing: 1: Two helpers (pt assisted with all activities)  FIM - Toileting Toileting: 0: Activity did not occur  FIM - Archivist Transfers: 0-Activity did not occur Toilet Transfers DO NOT USE: 0-Activity did not occur  FIM - Banker Devices: Sliding board Bed/Chair Transfer: 1: Two helpers  FIM - Locomotion: Wheelchair Distance: 40 Locomotion: Wheelchair: 1: Travels less than 50 ft with supervision, cueing or coaxing FIM - Locomotion: Ambulation Locomotion: Ambulation: 0: Activity did not occur  Comprehension Comprehension Mode: Auditory Comprehension: 2-Understands basic 25 - 49% of the time/requires cueing 51 - 75% of the time  Expression Expression Mode: Verbal Expression: 2-Expresses basic 25 - 49% of the time/requires cueing 50 - 75% of the time. Uses single words/gestures.  Social Interaction Social Interaction: 1-Interacts appropriately less than 25% of the time. May be withdrawn or combative.  Problem Solving Problem Solving: 1-Solves basic less than 25% of the time - needs direction nearly all the time or does not effectively solve problems and may need a restraint for safety  Memory Memory: 1-Recognizes or recalls less than 25% of the time/requires cueing greater than 75% of the time  2. Anticoagulation/DVT prophylaxis with Pharmaceutical: Lovenox Medical Problem List and Plan:  1. DVT Prophylaxis/Anticoagulation: Mechanical: Antiembolism stockings, knee  (TED hose) Bilateral lower extremities  Sequential compression devices, below knee Bilateral lower extremities. Lovenox  2. Pain Management: Utilize Tylenol generously as well as ice and heat. Can use hydrocodone for more significant pain. His pain is better controlled. He denies any pain today.  3. Mood: Celexa. Team wanted to provide ego support as patient does appear depressed. Depression screening per social work.  4. HTN: resume low dose ace. Good control overall.  BP Readings from Last 3 Encounters:  12/21/11 120/71  12/14/11 147/89  12/14/11 147/89  5. DM: continue CBG checks.On tube feeds with elevated BS. Resume metformin and use SSI BS control. Increase Lantus--to 21 units today 6. Insomnia: When necessary trazodone. Follow sleep patterns.  7. Leukocytosis: Reactive? Monitor for fevers 8.  Spasticity trial Zanaflex moniotr sedation and orthostasis LOS (Days) 7 A FACE TO FACE EVALUATION WAS PERFORMED  Zoella Roberti E 12/21/2011, 7:27 AM

## 2011-12-21 NOTE — Progress Notes (Signed)
Physical Therapy Note  Patient Details  Name: Darrell [name] MRN: [id number] Date of Birth: 1945-12-12 Today's Date: 12/21/2011  TIME IN/OUT 2440-1027 Individual therapy Pain- denies See eval for STG's, week 1  Skilled intervention: Bed mobility training while rolling side to side to don pants. Transfer training with slide board, did better out of room and when promised a rest break.  Sitting balance in gym S but in room on bed if he would get tired he would fall back.  Pt unable to instruct PT how to assist with any mobility, needs encouragement to participate and c/o fatigue immediately upon starting session.  Cognitive rehab- pt with decreased awareness, decreased sustained attention, poor working and short term memory See FIM for functional levels  Michaelene Song 12/21/2011, 10:06 AM

## 2011-12-22 ENCOUNTER — Ambulatory Visit: Payer: Medicare Other | Admitting: Vascular Surgery

## 2011-12-22 ENCOUNTER — Inpatient Hospital Stay (HOSPITAL_COMMUNITY): Payer: Medicare Other

## 2011-12-22 ENCOUNTER — Encounter: Payer: Medicare Other | Admitting: Vascular Surgery

## 2011-12-22 DIAGNOSIS — I6330 Cerebral infarction due to thrombosis of unspecified cerebral artery: Secondary | ICD-10-CM

## 2011-12-22 DIAGNOSIS — G8110 Spastic hemiplegia affecting unspecified side: Secondary | ICD-10-CM

## 2011-12-22 DIAGNOSIS — Z5189 Encounter for other specified aftercare: Secondary | ICD-10-CM

## 2011-12-22 LAB — GLUCOSE, CAPILLARY
Glucose-Capillary: 233 mg/dL — ABNORMAL HIGH (ref 70–99)
Glucose-Capillary: 267 mg/dL — ABNORMAL HIGH (ref 70–99)

## 2011-12-22 MED ORDER — INSULIN GLARGINE 100 UNIT/ML ~~LOC~~ SOLN
25.0000 [IU] | Freq: Every day | SUBCUTANEOUS | Status: DC
  Administered 2011-12-22 – 2011-12-23 (×2): 25 [IU] via SUBCUTANEOUS
  Filled 2011-12-22 (×2): qty 3

## 2011-12-22 MED ORDER — GLUCERNA SHAKE PO LIQD
237.0000 mL | Freq: Two times a day (BID) | ORAL | Status: DC
  Administered 2011-12-22 – 2011-12-23 (×4): 237 mL via ORAL
  Filled 2011-12-22 (×2): qty 237

## 2011-12-22 NOTE — Progress Notes (Signed)
Patient placed on cpap auto 14 cmH20 with 2 lpm 02 bleed in. Patient is tolerating settings. RT will continue to monitor.

## 2011-12-22 NOTE — Progress Notes (Signed)
Occupational Therapy Session Note  Patient Details  Name: Darrell [name] MRN: [id number] Date of Birth: [birth year]  Today's Date: 12/22/2011 Time: 1000-1103 Time Calculation (min): 63 min  Short Term Goals: Week 1:  OT Short Term Goal 1 (Week 1): Pt will complete UB bathing with mod assist with mod verbal cues for initiation OT Short Term Goal 2 (Week 1): Pt will complete UB dressing with mod assist with mod verbal cues for initiation OT Short Term Goal 3 (Week 1): Pt will complete LB bathing with max assist with mod verbal cues for initiation OT Short Term Goal 4 (Week 1): Pt will complete toilet transfer with max assist with mod verbal cues for initiation  Skilled Therapeutic Interventions/Progress Updates:    Pt with increased alertness and engagement this session.  Pt alert upon entering room and quickly agreeable to getting OOB.  Pt continues to require +2 for initiation and safety with transfers and sit to stand.  Focus on following one-step commands with functional tasks, increased initiation, weight shifting in sitting and standing, weight bearing through RLE in standing, and attention to Rt body/environment.  Pt demonstrated increased awareness of RUE in standing at sink with initiation of reaching for RUE with stand to sit.  Pt participates in all steps of bathing, requiring increased time and occasionally setup by placing wash cloth on next body part to bathe or bringing LUE to location to initiate.  Therapy Documentation Precautions:  Precautions Precautions: Fall Precaution Comments: motor apraxia, right hemiplegia, delayed initiation, right LE flexor withdrawl Required Braces or Orthoses: No Restrictions Weight Bearing Restrictions: No Other Position/Activity Restrictions: monitor O2 during mobility Pain: Pain Assessment Pain Score: 0-No pain  See FIM for current functional status  Therapy/Group: Individual Therapy  [name] 12/22/2011, 12:16 PM

## 2011-12-22 NOTE — Progress Notes (Signed)
Nutrition Follow-up  Pt upgraded to Dysphagia 3 diet with thin liquids s/p MBSS on 2/26. Intake is poor, pt is eating 5-30% of meals. RN reports pt will take enough pudding to take medications but will not finish pudding, not taking prostat.  Diet Order:  Dysphagia 3 with thin Supplement: Ensure Pudding PO BID, Prostat PO daily  Meds: Scheduled Meds:   . sodium chloride   Intravenous Q24H  . antiseptic oral rinse  15 mL Mouth Rinse q12n4p  . aspirin  325 mg Oral Daily  . chlorhexidine  15 mL Mouth Rinse BID  . citalopram  10 mg Oral Daily  . enoxaparin (LOVENOX) injection  40 mg Subcutaneous Q24H  . feeding supplement  1 Container Oral BID BM  . feeding supplement  30 mL Oral Daily  . insulin aspart  0-15 Units Subcutaneous TID WC  . insulin aspart  0-5 Units Subcutaneous QHS  . insulin glargine  25 Units Subcutaneous QHS  . lisinopril  5 mg Oral Daily  . metFORMIN  1,000 mg Oral BID WC  . methylphenidate  10 mg Oral BID WC  . tiZANidine  2 mg Oral TID  . DISCONTD: insulin glargine  21 Units Subcutaneous QHS   Continuous Infusions:  PRN Meds:.acetaminophen, alum & mag hydroxide-simeth, bisacodyl, cloNIDine, diphenhydrAMINE, food thickener, guaiFENesin-dextromethorphan, HYDROcodone-acetaminophen, polyethylene glycol, promethazine, promethazine, promethazine, traZODone  Labs:  CMP     Component Value Date/Time   NA 136 12/15/2011 0650   K 3.9 12/15/2011 0650   CL 102 12/15/2011 0650   CO2 23 12/15/2011 0650   GLUCOSE 201* 12/15/2011 0650   GLUCOSE 141* 10/05/2006 0904   BUN 16 12/15/2011 0650   CREATININE 0.65 12/15/2011 0650   CALCIUM 8.9 12/15/2011 0650   PROT 6.6 12/15/2011 0650   ALBUMIN 2.9* 12/15/2011 0650   AST 17 12/15/2011 0650   ALT 22 12/15/2011 0650   ALKPHOS 44 12/15/2011 0650   BILITOT 0.6 12/15/2011 0650   GFRNONAA >90 12/15/2011 0650   GFRAA >90 12/15/2011 0650   CBG (last 3)   Basename 12/22/11 1133 12/22/11 0849 12/21/11 2132  GLUCAP 267* 233* 181*      Intake/Output Summary (Last 24 hours) at 12/22/11 1227 Last data filed at 12/22/11 1000  Gross per 24 hour  Intake    840 ml  Output    650 ml  Net    190 ml    Weight Status:  99.8 kg, wt down 7.2 kg x 1 week likely 2/2 poor PO intake  Estimated needs:  1860 - 2000 kcal, 90 - 105 grams protien  Nutrition Dx:  Inadequate oral intake now r/t food choices and attention AEB pt and staff report.  Goal:  Pt to consume >/= 90% of estimated needs. Unmet.  Intervention:  Nutrition Ambassador to take food preferences. Add Glucerna Shakes PO BID for additional PO intake. Discontinue prostat.  Monitor:  Weights, labs, PO intake, I/O's  Adair Laundry Pager #:  [phone]

## 2011-12-22 NOTE — Progress Notes (Signed)
Speech Pathology: Dysphagia Treatment Note  1200-1230 Group Session   Patient was observed with : Dys. 2/ Ground and Thin liquids.  Patient was noted to have s/s of aspiration : Yes:  Cough x1; intermittent throat clears  Patient required: moderate cues to consistently follow precautions/strategies  Clinical Impression: SLP facilitated session with moderate assist semantic cues to utilize portion control, pacing and use of throat clear, cough after sips and demonstrated prolonged mastication of textures; however, this SLP suspects food preferences also play a role in the efficiency of mastication.    Recommendations:  Continue with current plan of care  Pain:   none Intervention Required:   No  Goals: Progressing  Fae Pippin, M.A., CCC-SLP [phone]

## 2011-12-22 NOTE — Procedures (Signed)
Modified Barium Swallow Procedure Note Patient Details  Name: ZYGMUND PASSERO MRN: [id number] Date of Birth: [birth year]  Today's Date: 12/22/2011 Time: 4098-1191 Time Calculation (min): 30 min  Past Medical History:  Past Medical History  Diagnosis Date  . Diabetes mellitus   . Sleep apnea     cpap      sleep study > 4 yrs  . Hypertension     dr Tawanna Cooler   Past Surgical History:  Past Surgical History  Procedure Date  . Hernia repair   . Heel spur surgery   . Heer surgery   . Endarterectomy 12/09/2011    Procedure: ENDARTERECTOMY CAROTID;  Surgeon: [name], MD;  Location: [facility] OR;  Service: Vascular;  Laterality: Left;  Left Carotid endarterectomy with dacron patch angioplasty   HPI:  JAHKAI YANDELL is an 66 y.o. 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 Hart Rochester. In recovery, patient with decreased LOC and inability to move right side. Patient taken back to OR emergently for exploration of L-CEA with thrombectomy of meniscectomized surface and distal ICA. Neurology and Hem/Onc consulted for input. 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 MBS done 12/11/11 and Dys.I nectar-thick liquid diet initiated. Patient transferred to [facility] 12/14/11 and has been participating in therapies.      Recommendation/Prognosis  Clinical Impression Dysphagia Diagnosis: Mild oral phase dysphagia;Mild pharyngeal phase dysphagia Clinical impression: Patient presents with mild sensori-motor oral/pharyngeal dysphagia charcterized by prolonged mastication of regular textures; delayed swallow initiation with premature spillage to the pyriform sinuses which results in delayed but sensed, mild aspiratoin of thin liquids with straw sips and silent, trace penetration of thin liquids via cup sips. As well as mild vallecular residue post swallows.  Given that penetration was minimal and patient  was able to clear it with a cued cough, it is recomended that this patient begin a Dys.3 texture diet with thin liquids with full supervision to ensure strict use of compensatory strategies.    Swallow Evaluation Recommendations Solid Consistency: Dysphagia 3 (Mechanical soft) Liquid Consistency: Thin Liquid Administration via: Cup;No straw Medication Administration: Whole meds with puree Supervision: Patient able to self feed;Full supervision/cueing for compensatory strategies Compensations: Slow rate;Small sips/bites;Multiple dry swallows after each bite/sip (cough after sips of liquids) Postural Changes and/or Swallow Maneuvers: Seated upright 90 degrees Oral Care Recommendations: Oral care BID Follow up Recommendations: Outpatient SLP;Home health SLP Prognosis Prognosis for Safe Diet Advancement: Good Barriers to Reach Goals: Cognitive deficits;Language deficits Individuals Consulted Consulted and Agree with Results and Recommendations: Patient unable/family or caregiver not available  SLP Assessment/Plan Speech Therapy Frequency: min 5x/week Duration: 2 weeks Potential to Achieve Goals: Good Potential Considerations: Ability to learn/carryover information;Severity of impairments  SLP Goals  SLP Swallowing Goals Patient will consume recommended diet without observed clinical signs of aspiration with: Supervision/safety Patient will utilize recommended strategies during swallow to increase swallowing safety with: Supervision/safety  General:  Date of Onset: 12/09/11 HPI: LEAM MADERO is an 66 y.o. 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 Hart Rochester. In recovery, patient with decreased LOC and inability to move right side. Patient taken back to OR emergently for exploration of L-CEA with thrombectomy of meniscectomized surface and distal ICA. Neurology and Hem/Onc consulted for input. MRI/MRA brain revealed non hemorrhagic infarcts in left MCA/PCA  distrubution and focal infarct in left BG, no significant stenosis. Hypercoagulation  workup in progress noted to have positive lupus anticoagulant but not reliable due to proximity of treatment with heparin. Patient on ASA per neurology recommendations. PT/ST evaluations done yesterday. MBS done On 02/15 and diet initiated this weekend- DI, nectar liquids.  Type of Study: Repeat MBS Diet Prior to this Study: Dysphagia 2 (chopped);Nectar-thick liquids Temperature Spikes Noted: No Respiratory Status: Room air History of Intubation: Yes Behavior/Cognition: Alert;Cooperative;Pleasant mood Oral Cavity - Dentition: Adequate natural dentition Oral Motor / Sensory Function: Impaired - see Bedside swallow eval Vision: Functional for self-feeding Patient Positioning: Upright in chair Baseline Vocal Quality: Clear Volitional Cough: Weak (reflexive cough stronger) Volitional Swallow: Able to elicit Anatomy: Within functional limits Pharyngeal Secretions: Not observed secondary MBS  Reason for Referral:  Assess for presence of penetration/aspiration  Oral Phase Oral Preparation/Oral Phase Oral Phase: Impaired Oral - Honey Oral - Honey Teaspoon: Not tested Oral - Nectar Oral - Nectar Teaspoon: Not tested Oral - Nectar Cup: Within functional limits Oral - Thin Oral - Thin Cup: Within functional limits Oral - Solids Oral - Puree: Not tested Oral - Regular: Other (Comment) (prolonged mastication) Pharyngeal Phase  Pharyngeal Phase Pharyngeal Phase: Impaired Pharyngeal - Honey Pharyngeal - Honey Teaspoon: Not tested Pharyngeal - Nectar Pharyngeal - Nectar Teaspoon: Not tested Pharyngeal - Nectar Cup: Premature spillage to pyriform;Pharyngeal residue - valleculae;Reduced tongue base retraction Pharyngeal - Thin Pharyngeal - Thin Cup: Delayed swallow initiation;Premature spillage to pyriform;Penetration/Aspiration during swallow;Pharyngeal residue - valleculae;Compensatory strategies attempted  (Comment) (cued second swallow reduced residue) Penetration/Aspiration details (thin cup): Material enters airway, remains ABOVE vocal cords and not ejected out Pharyngeal - Thin Straw: Delayed swallow initiation;Premature spillage to pyriform;Penetration/Aspiration before swallow;Moderate aspiration;Pharyngeal residue - valleculae;Reduced tongue base retraction Penetration/Aspiration details (thin straw): Material enters airway, passes BELOW cords and not ejected out despite cough attempt by patient (delayed cough attempt) Pharyngeal - Solids Pharyngeal - Puree: Not tested Pharyngeal - Regular: Delayed swallow initiation;Premature spillage to valleculae;Reduced epiglottic inversion;Reduced tongue base retraction;Pharyngeal residue - valleculae;Compensatory strategies attempted (Comment) (cued second swallow reduced residue) Cervical Esophageal Phase  Cervical Esophageal Phase Cervical Esophageal Phase: [facility]  Fae Pippin, M.A., CCC-SLP [phone]  Mikyah Alamo 12/22/2011, 10:08 AM

## 2011-12-22 NOTE — Progress Notes (Signed)
Social Work Patient ID: Darrell [name], male   DOB: July 29, 1946, 66 y.o.   MRN: [id number]  Met with wife who has been out visiting area facilities and likes [facility] and [address].  Will send information To both facilities and see if bed offered.  Continue to work on discharge needs.

## 2011-12-22 NOTE — Progress Notes (Signed)
Physical Therapy Session Note  Patient Details  Name: Darrell [name] MRN: [id number] Date of Birth: 04-07-1946  Today's Date: 12/22/2011 Time: 4098-1191 Time Calculation (min): 48 min  Short Term Goals: Week 2:  PT Short Term Goal 1 (Week 2): Pt will transfer supine to sit with max A 2/3 trials with cues (to R or left, whichever pt is more I at) PT Short Term Goal 1 - Progress (Week 2): Progressing toward goal PT Short Term Goal 2 (Week 2): Pt will transfer bed to/from w/c with max A pt >=40% 2/3 trials with cues PT Short Term Goal 2 - Progress (Week 2): Progressing toward goal PT Short Term Goal 3 (Week 2): Pt will particiapte in seated  balance activity 6 minutes without LOB to demonstrate improved activity tolerance and attention to task PT Short Term Goal 3 - Progress (Week 2): Progressing toward goal PT Short Term Goal 4 (Week 2): Pt stand with total A +2 pt =25% PT Short Term Goal 4 - Progress (Week 2): Progressing toward goal PT Short Term Goal 5 (Week 2): Pt will propel w/c in controlled evironment 60 ft with min A with mod cues  Skilled Therapeutic Interventions/Progress Updates:     Therapy Documentation Precautions:  Precautions Precautions: Fall Precaution Comments: motor apraxia, right hemiplegia, delayed initiation, right LE flexor withdrawl Required Braces or Orthoses: No Restrictions Weight Bearing Restrictions: No Other Position/Activity Restrictions: monitor O2 during mobility General: Chart Reviewed: Yes Amount of Missed PT Time (min): 27 Minutes (due to bowel and bladder complications) Family/Caregiver Present: No   Pain: Pain Assessment Pain Assessment: No/denies pain Pain Score: 0-No pain    Treatments:  Patient supine in bed upon entry, condom catheter tubing in hand, patient moving it and pulling in back and forth. Condom catheter had been pulled off, and bed and pants were wet. Patient unaware of this. Bed mobility, rolling left and right mod  assist, focus on initiation of functional movement. Improved time to initiate, however still delayed. Changed pants and performed hygiene with total assist. Sidelying to sit total assist. Once in sitting, patient's stomach began to rumble. Asked patient if he needed to go to the bathroom, patient stated, "no," then said with urgency, "I'm rumbling, I'm rumbling," and pointed to his stomach and began reaching for the bed rail to lie down. Returned to supine with total assist. Performed bed mobility with mod assist and bridging with assist to place bilat LEs in knee flexion position. Doffed pants and placed bedpan with total assist. Patient immediately began to have BM. Improved awareness, sustained attention, and communication of basic needs today. Patient needed a lot of time to complete active BM this afternoon. Wife present at end of session with PT on standby to assist as needed with nursing when [facility] is finished.  See FIM for current functional status  Therapy/Group: Individual Therapy  Romeo Rabon 12/22/2011, 4:48 PM

## 2011-12-22 NOTE — Progress Notes (Signed)
Patient ID: Darrell [name], male   DOB: [birth year], 66 y.o.   MRN: [id number] Subjective/Complaints Awake but continues to be disoriented.PT notes flexor withdrawal RLE :Review of Systems  Neurological: Positive for focal weakness.  Psychiatric/Behavioral: Positive for depression.       Adjustment d/o  All other systems reviewed and are negative.    Objective: Vital Signs: Blood pressure 122/85, pulse 93, temperature 98.5 F (36.9 C), temperature source Oral, resp. rate 19, weight 99.8 kg (220 lb 0.3 oz), SpO2 91.00%. No results found. Results for orders placed during the hospital encounter of 12/14/11 (from the past 72 hour(s))  GLUCOSE, CAPILLARY     Status: Abnormal   Collection Time   12/19/11  8:01 AM      Component Value Range Comment   Glucose-Capillary 197 (*) 70 - 99 (mg/dL)    Comment 1 Notify RN     GLUCOSE, CAPILLARY     Status: Abnormal   Collection Time   12/19/11 12:08 PM      Component Value Range Comment   Glucose-Capillary 219 (*) 70 - 99 (mg/dL)   GLUCOSE, CAPILLARY     Status: Abnormal   Collection Time   12/19/11  5:14 PM      Component Value Range Comment   Glucose-Capillary 173 (*) 70 - 99 (mg/dL)    Comment 1 Notify RN     GLUCOSE, CAPILLARY     Status: Abnormal   Collection Time   12/19/11  9:39 PM      Component Value Range Comment   Glucose-Capillary 264 (*) 70 - 99 (mg/dL)   GLUCOSE, CAPILLARY     Status: Abnormal   Collection Time   12/20/11  7:44 AM      Component Value Range Comment   Glucose-Capillary 197 (*) 70 - 99 (mg/dL)    Comment 1 Notify RN     GLUCOSE, CAPILLARY     Status: Abnormal   Collection Time   12/20/11 12:08 PM      Component Value Range Comment   Glucose-Capillary 272 (*) 70 - 99 (mg/dL)    Comment 1 Notify RN     GLUCOSE, CAPILLARY     Status: Abnormal   Collection Time   12/20/11  4:57 PM      Component Value Range Comment   Glucose-Capillary 251 (*) 70 - 99 (mg/dL)    Comment 1 Notify RN     GLUCOSE, CAPILLARY      Status: Abnormal   Collection Time   12/20/11  8:38 PM      Component Value Range Comment   Glucose-Capillary 198 (*) 70 - 99 (mg/dL)   GLUCOSE, CAPILLARY     Status: Abnormal   Collection Time   12/21/11  7:26 AM      Component Value Range Comment   Glucose-Capillary 180 (*) 70 - 99 (mg/dL)    Comment 1 Notify RN     GLUCOSE, CAPILLARY     Status: Abnormal   Collection Time   12/21/11 12:07 PM      Component Value Range Comment   Glucose-Capillary 205 (*) 70 - 99 (mg/dL)    Comment 1 Notify RN     GLUCOSE, CAPILLARY     Status: Abnormal   Collection Time   12/21/11  4:19 PM      Component Value Range Comment   Glucose-Capillary 170 (*) 70 - 99 (mg/dL)    Comment 1 Notify RN     GLUCOSE, CAPILLARY  Status: Abnormal   Collection Time   12/21/11  9:32 PM      Component Value Range Comment   Glucose-Capillary 181 (*) 70 - 99 (mg/dL)    Comment 1 Notify RN       Gen:  NAD Mood/affect- no lability or aggitation Lung- Clear  Cor-RRR no Murmur Abd-soft NT to palpation Ext no C/C/E Motor: 0/5 in RUE, 2-/5 RLE Tone- increase in R finger flex, + R LE triple flexor but does have voluntary ext Assessment/Plan: 1. 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 Physiatrist is providing close team supervision and 24 hour management of active medical problems listed below. Physiatrist and rehab team continue to assess barriers to discharge/monitor patient progress toward functional and medical goals. FIM: FIM - Bathing Bathing Steps Patient Completed: Chest;Abdomen;Left upper leg;Right upper leg Bathing: 1: Two helpers (Pt assisted with all activities, 2 helpers to stand)  FIM - Upper Body Dressing/Undressing Upper body dressing/undressing steps patient completed:  (Pt assisted with Lt sleeve, over head, & pulling shirt down) Upper body  dressing/undressing: 1: Total-Patient completed less than 25% of tasks FIM - Lower Body Dressing/Undressing Lower body dressing/undressing: 1: Two helpers (Pt assisted with all activities w/ increased initiation)  FIM - Toileting Toileting: 0: Activity did not occur  FIM - Archivist Transfers: 0-Activity did not occur Toilet Transfers DO NOT USE: 0-Activity did not occur  FIM - Banker Devices: Sliding board Bed/Chair Transfer: 1: Two helpers  FIM - Locomotion: Wheelchair Distance: 40 Locomotion: Wheelchair: 1: Total Assistance/staff pushes wheelchair (Pt<25%) FIM - Locomotion: Ambulation Locomotion: Ambulation: 0: Activity did not occur  Comprehension Comprehension Mode: Auditory Comprehension: 2-Understands basic 25 - 49% of the time/requires cueing 51 - 75% of the time  Expression Expression Mode: Verbal Expression: 2-Expresses basic 25 - 49% of the time/requires cueing 50 - 75% of the time. Uses single words/gestures.  Social Interaction Social Interaction: 2-Interacts appropriately 25 - 49% of time - Needs frequent redirection.  Problem Solving Problem Solving: 1-Solves basic less than 25% of the time - needs direction nearly all the time or does not effectively solve problems and may need a restraint for safety  Memory Memory: 1-Recognizes or recalls less than 25% of the time/requires cueing greater than 75% of the time  2. Anticoagulation/DVT prophylaxis with Pharmaceutical: Lovenox Medical Problem List and Plan:  1. DVT Prophylaxis/Anticoagulation: Mechanical: Antiembolism stockings, knee (TED hose) Bilateral lower extremities  Sequential compression devices, below knee Bilateral lower extremities. Lovenox  2. Pain Management: Utilize Tylenol generously as well as ice and heat. Can use hydrocodone for more significant pain. His pain is better controlled. He denies any pain today.  3. Mood: Celexa. Team wanted to  provide ego support as patient does appear depressed. Depression screening per social work.  4. HTN: resume low dose ace. Good control overall.  BP Readings from Last 3 Encounters:  12/22/11 122/85  12/14/11 147/89  12/14/11 147/89    5. DM: continue CBG checks.On Ensure.  Resume metformin and use SSI BS control. Increase Lantus--to 25 units today 6. Insomnia: When necessary trazodone. Follow sleep patterns.  7. Leukocytosis: Reactive? Monitor for fevers 8.  Spasticity trial Zanaflex monitor sedation and orthostasis LOS (Days) 8 A FACE TO FACE EVALUATION WAS PERFORMED  Lunden Mcleish E 12/22/2011, 7:23 AM

## 2011-12-23 LAB — GLUCOSE, CAPILLARY
Glucose-Capillary: 174 mg/dL — ABNORMAL HIGH (ref 70–99)
Glucose-Capillary: 147 mg/dL — ABNORMAL HIGH (ref 70–99)

## 2011-12-23 MED ORDER — LISINOPRIL 5 MG PO TABS: 5.0000 mg | ORAL_TABLET | Freq: Every day | ORAL | Status: DC

## 2011-12-23 MED ORDER — BIOTENE DRY MOUTH MT LIQD: 15.0000 mL | Freq: Two times a day (BID) | OROMUCOSAL | Status: DC

## 2011-12-23 MED ORDER — ASPIRIN 325 MG PO TABS: 325.0000 mg | ORAL_TABLET | Freq: Every day | ORAL | Status: DC

## 2011-12-23 MED ORDER — LISINOPRIL 10 MG PO TABS: 5.0000 mg | ORAL_TABLET | Freq: Two times a day (BID) | ORAL | Status: DC

## 2011-12-23 MED ORDER — INSULIN GLARGINE 100 UNIT/ML ~~LOC~~ SOLN: 25.0000 [IU] | Freq: Every day | SUBCUTANEOUS | Status: DC

## 2011-12-23 MED ORDER — METHYLPHENIDATE HCL 10 MG PO TABS: 10.0000 mg | ORAL_TABLET | Freq: Two times a day (BID) | ORAL | Status: DC

## 2011-12-23 MED ORDER — TIZANIDINE HCL 2 MG PO TABS: 2.0000 mg | ORAL_TABLET | Freq: Three times a day (TID) | ORAL | Status: AC

## 2011-12-23 NOTE — Progress Notes (Signed)
Speech Language Pathology Daily Session Note & Progress Update  Patient Details  Name: Darrell [name] MRN: [id number] Date of Birth: 03/09/1946  Today's Date: 12/23/2011 Time: 0805-0900 Time Calculation (min): 55 min  Short Term Goals: Week 1: SLP Short Term Goal 1 (Week 1): Patient will sustain attention in basic ADL task for 2-3 minutes with maximum verbal/tactile/contextual cues. SLP Short Term Goal 2 (Week 1): Patient will follow basic 1 step commands in familiar, functional ADL task with maximum verbal/tactile/contextual cues. SLP Short Term Goal 3 (Week 1): Patient will verbalize basic wants/needs 75% of the time with maximum semantic cues. SLP Short Term Goal 4 (Week 1): Patient will verbalize automatic/biographical information at 2-3 word level with moderate semantic or sentence completion cues. SLP Short Term Goal 5 (Week 1): Patient will solve basic problems in automatic, rote, familiar ADL tasks with maximum verbal/tactile/contextual cues.  Skilled Therapeutic Interventions: Session focused on functional expression of wants and needs as well as safely self feeding.  Patient required total assist to select a familiar object from a field of 3; required mod assist semantic and phonemic cues to name basic objects; patient required max assist semantic cues to utilize safe swallow compensatory strategies.    Daily Session Precautions/Restrictions    FIM:  Comprehension Comprehension Mode: Auditory Comprehension: 2-Understands basic 25 - 49% of the time/requires cueing 51 - 75% of the time Expression Expression Mode: Verbal Expression: 2-Expresses basic 25 - 49% of the time/requires cueing 50 - 75% of the time. Uses single words/gestures. Social Interaction Social Interaction: 3-Interacts appropriately 50 - 74% of the time - May be physically or verbally inappropriate. Problem Solving Problem Solving: 2-Solves basic 25 - 49% of the time - needs direction more than half the time  to initiate, plan or complete simple activities Memory Memory: 1-Recognizes or recalls less than 25% of the time/requires cueing greater than 75% of the time FIM - Eating Eating Activity: 5: Set-up assist for open containers;5: Supervision/cues;5: Set-up assist for cut food Pain Pain Assessment Pain Assessment: No/denies pain Pain Score: 0-No pain Patients Stated Pain Goal: 3 (states" yes" to 3 or less) Multiple Pain Sites: No   Cognition: Overall Cognitive Status: Impaired Arousal/Alertness: Awake/alert Orientation Level: Oriented to person;Disoriented to time;Disoriented to situation;Disoriented to place Attention: Sustained Sustained Attention: Impaired (basic, familiar task for 2-3 minutes) Sustained Attention Impairment: Functional basic Memory: Impaired Memory Impairment: Retrieval deficit;Storage deficit;Decreased recall of new information;Decreased short term memory (difficult to assess due to language impairment) Decreased Long Term Memory: Verbal basic Decreased Short Term Memory: Verbal basic;Functional basic Awareness: Impaired Awareness Impairment: Intellectual impairment Problem Solving: Impaired Problem Solving Impairment: Verbal basic;Functional basic Executive Function:  (max assist) Initiating: Impaired (mod-max) Initiating Impairment: Verbal basic;Functional basic Behaviors: Perseveration;Poor frustration tolerance Safety/Judgment: Impaired Oral/Motor: Oral Motor/Sensory Function Overall Oral Motor/Sensory Function: Impaired Labial ROM: Within Functional Limits Labial Symmetry: Within Functional Limits Labial Strength: Within Functional Limits Labial Sensation: Reduced Lingual ROM: Within Functional Limits Lingual Symmetry: Within Functional Limits Lingual Strength: Reduced Lingual Sensation: Within Functional Limits Facial ROM: Within Functional Limits Facial Symmetry: Within Functional Limits Facial Strength: Within Functional Limits Facial Sensation:  Reduced Mandible: Within Functional Limits Motor Speech Overall Motor Speech: Appears within functional limits for tasks assessed Respiration: Within functional limits Phonation: Normal Resonance: Within functional limits Articulation: Within functional limitis Intelligibility: Intelligible Motor Planning: Witnin functional limits Comprehension: Auditory Comprehension Overall Auditory Comprehension: Impaired Yes/No Questions: Impaired Basic Biographical Questions: 26-50% accurate Commands: Impaired One Step Basic Commands: 25-49% accurate Conversation: Simple Other  Conversation Comments: Continues to respond to visual information and facial expression Interfering Components: Attention;Pain;Anxiety;Processing speed;Working Radio broadcast assistant: Public house manager: Not tested Common Objects: Unable to indentify Reading Comprehension Reading Status: Impaired Word level: Impaired (days of the week; name tag) Expression: Expression Primary Mode of Expression: Verbal Verbal Expression Overall Verbal Expression: Impaired Initiation: Impaired Automatic Speech: Name Level of Generative/Spontaneous Verbalization: Word;Phrase Repetition: No impairment Naming: Impairment Responsive: 0-25% accurate Confrontation: Impaired Common Objects: Unable to indentify Divergent: 0-24% accurate Other Naming Comments: semantic and phonemis cues effective when pain is not present Verbal Errors: Perseveration;Echolalia;Not aware of errors Pragmatics: Impairment Impairments: Eye contact Interfering Components: Attention Effective Techniques: Phonemic cues;Written cues Non-Verbal Means of Communication: Not applicable Other Verbal Expression Comments: paient with limited vocabulary and vague expression of wants and needs Written Expression Dominant Hand: Right Written Expression: Unable to assess  (comment)  Therapy/Group: Individual Therapy  Charlane Ferretti., CCC-SLP [phone]  Tarra Pence 12/23/2011, 11:29 AM

## 2011-12-23 NOTE — Plan of Care (Signed)
Problem: RH KNOWLEDGE DEFICIT Goal: RH STG INCREASE KNOWLEDGE OF DIABETES Verbalizes importance of medication and diet to control diabetes Outcome: Not Progressing Pt is not able to express any information related to diabetes

## 2011-12-23 NOTE — Progress Notes (Signed)
Physical Therapy Session Note  Patient Details  Name: Darrell [name] MRN: [id number] Date of Birth: 02/22/1946  Today's Date: 12/23/2011 Time: 1300-1401 Time Calculation (min): 61 min  Short Term Goals: Week 2:  PT Short Term Goal 1 (Week 2): Pt will transfer supine to sit with max A 2/3 trials with cues (to R or left, whichever pt is more I at) PT Short Term Goal 1 - Progress (Week 2): Progressing toward goal PT Short Term Goal 2 (Week 2): Pt will transfer bed to/from w/c with max A pt >=40% 2/3 trials with cues PT Short Term Goal 2 - Progress (Week 2): Progressing toward goal PT Short Term Goal 3 (Week 2): Pt will particiapte in seated  balance activity 6 minutes without LOB to demonstrate improved activity tolerance and attention to task PT Short Term Goal 3 - Progress (Week 2): Progressing toward goal PT Short Term Goal 4 (Week 2): Pt stand with total A +2 pt =25% PT Short Term Goal 4 - Progress (Week 2): Progressing toward goal PT Short Term Goal 5 (Week 2): Pt will propel w/c in controlled evironment 60 ft with min A with mod cues PT Short Term Goal 5 - Progress (Week 2): Progressing toward goal  Skilled Therapeutic Interventions/Progress Updates:     Therapy Documentation Precautions:  Precautions Precautions: Fall Precaution Comments: motor apraxia, right hemiplegia, delayed initiation, right LE flexor withdrawl Required Braces or Orthoses: No Restrictions Weight Bearing Restrictions: No Other Position/Activity Restrictions: monitor O2 during mobility General: Chart Reviewed: Yes Family/Caregiver Present: No   Pain: Pain Assessment Pain Assessment: No/denies pain Pain Score: 0-No pain    Treatments:  Patient seated in w/c upon entry into room. Slideboard transfer training left and right total assist, focus on initiation of movement and anterior weight shift. Standing with sara plus, dynamic balance activities with emphasis on sustained attention to functional task,  right trunk elongation, and right weight shift. Progressed to gait x 15 feet with sara plus, good step response bilat with intermittent decreased right foot clearance with fatigue. During gait, patient stated he needed to have BM. Slideboard transfer to bed with total assist. Bed mobility and rolling left and right to doff clothing min assist, tactile cues for sequencing and right LE placement. Patient continent of bowel into bed pan at end of session.  Following first sit-stand, patient said, "whoa" and closed eyes. When asked to open them, patient able to sustain eye contact with PT x 15 seconds. No nystagmus or pupil constriction or dilation noted. BP 117/86, HR 96. ? Positional sensitivity. Will continue to assess.  See FIM for current functional status  Therapy/Group: Individual Therapy  Romeo Rabon 12/23/2011, 2:02 PM

## 2011-12-23 NOTE — Progress Notes (Signed)
Speech Language Pathology Daily Session Note  Patient Details  Name: Darrell [name] MRN: [id number] Date of Birth: [birth year]  Today's Date: 12/23/2011 Time: 1130-1230 Time Calculation (min): 60 min  Short Term Goals: Week 1: SLP Short Term Goal 1 (Week 1): Patient will sustain attention in basic ADL task for 2-3 minutes with maximum verbal/tactile/contextual cues. SLP Short Term Goal 2 (Week 1): Patient will follow basic 1 step commands in familiar, functional ADL task with maximum verbal/tactile/contextual cues. SLP Short Term Goal 3 (Week 1): Patient will verbalize basic wants/needs 75% of the time with maximum semantic cues. SLP Short Term Goal 4 (Week 1): Patient will verbalize automatic/biographical information at 2-3 word level with moderate semantic or sentence completion cues. SLP Short Term Goal 5 (Week 1): Patient will solve basic problems in automatic, rote, familiar ADL tasks with maximum verbal/tactile/contextual cues.  Skilled Therapeutic Interventions: Patient consumed Dy.3 textures and thin liquids via cup with SLP attempting to fade cuing from max assist to mod assist cues with intermittent extra swallow, throat clear suspected to be due to increased senastion and not patient recalling and carrying over strategies.  Patient required max assist to sequence putting down utensils prior to wiping mouth or getting a drink and required verbal as well as visual cues to follow 1-step directions.   Daily Session Pain Pain Assessment Pain Assessment: No/denies pain Pain Score: 0-No pain  Therapy/Group: Individual Therapy  Charlane Ferretti., CCC-SLP [phone]  Darrell [name] 12/23/2011, 3:43 PM

## 2011-12-23 NOTE — Patient Care Conference (Signed)
Inpatient RehabilitationTeam Conference Note Date: 12/23/2011   Time: 11:00 AM    Patient Name: Darrell [name]      Medical Record Number: [id number]  Date of Birth: May 16, 1946 Sex: Male         Room/Bed: 4028/4028-02 Payor Info: Payor: MEDICARE  Plan: MEDICARE PART A AND B  Product Type: *No Product type*     Admitting Diagnosis: L MCA AND L BL CVA  Admit Date/Time:  12/14/2011  5:53 PM Admission Comments: No comment available   Primary Diagnosis:  CVA (cerebral infarction) Principal Problem: CVA (cerebral infarction)  Patient Active Problem List  Diagnoses Date Noted  . CVA (cerebral infarction) 12/15/2011  . Occlusion and stenosis of carotid artery without mention of cerebral infarction 12/08/2011  . Carotid artery plaque 12/04/2011  . Left carotid bruit 11/30/2011  . AUTONOMIC NEUROPATHY, DIABETIC 11/25/2010  . ASTHMA, ACUTE 04/07/2010  . HYPERLIPIDEMIA 03/15/2008  . VIRAL URI 02/10/2008  . SLEEP APNEA, OBSTRUCTIVE, SEVERE 07/27/2007  . DIABETES MELLITUS, TYPE II 06/15/2007  . HYPERTENSION 06/15/2007    Expected Discharge Date:  SNF Placement  Team Members Present: Physician: Dr. Claudette Laws Case Manager Present: Lutricia Horsfall, RN Social Worker Present: Dossie Der, LCSW Nurse Present: Gregor Hams, RN PT Present: Illene Bolus, PT OT Present: Bretta Bang, Verlene Mayer, OT SLP Present: Fae Pippin, SLP Tora Duck, RN, PPS Coord    Current Status/Progress Goal Weekly Team Focus  Medical   For initiation on a Ritalin trial  Assess effect of Ritalin trial  Increase therapy participation and tolerance   Bowel/Bladder   incontinent bladder condom cath at hs attempting toileting every 3hrs with urinal unsuccessful lbm 12-22-11 incontinent  loose bm   manage bowel and bladder   attempt every 3 hrs toileting during day condom cath at hs   Swallow/Nutrition/ Hydration   Dys.3 textures, thin liquids full staff supervision no straws, cough after sips   least restrictive p.o. intake  carryover use of compensatory strategies   ADL's   total assist +2 transfers, max assist bathing, total assist UB and LB dressing with +2 for standing  min assist overall  transfers, activity tolerance, initiation, family education   Mobility   total assist + 2  min assist w/c level  decreased assist transfers, sustained attention, activity tolerance   Communication   mod-max assist  mod assist  increase use of specific words   Safety/Cognition/ Behavioral Observations  max assist  mod assist  increase problem solving and carryover   Pain   right shoulder denies need for meds  pain less than 3  monitor pain level   Skin   incisions healed left neck and left groin steristrips abrasion to right buttocks allelvyn foam dressing applied left inner buttocks with small opening barrier cream applied  no new breakdown  educate family how to provide skin care      *See Interdisciplinary Assessment and Plan and progress notes for long and short-term goals  Barriers to Discharge: Heavy physical assist level    Possible Resolutions to Barriers:  Notify additional caregivers or discussed SNF placement    Discharge Planning/Teaching Needs:  Looking for NH bed, wife visiting area facilities      Team Discussion:  Order to d/c IV. Pt will drink fluids when offered -- need to push fluids. Pt having dizziness and nausea, possible vestibular issues. Plan for SNF placement.  Revisions to Treatment Plan:     Continued Need for Acute Rehabilitation Level of Care: The patient requires  daily medical management by a physician with specialized training in physical medicine and rehabilitation for the following conditions: Daily direction of a multidisciplinary physical rehabilitation program to ensure safe treatment while eliciting the highest outcome that is of practical value to the patient.: Yes Daily medical management of patient stability for increased activity during  participation in an intensive rehabilitation regime.: Yes Daily analysis of laboratory values and/or radiology reports with any subsequent need for medication adjustment of medical intervention for : Neurological problems  Meryl Dare 12/23/2011, 2:41 PM

## 2011-12-23 NOTE — Progress Notes (Signed)
Patient ID: Darrell [name], male   DOB: 28-Oct-1945, 66 y.o.   MRN: [id number] Subjective/Complaints Awake but continues to be disoriented.PT notes flexor withdrawal RLE.  Inc of stool and unaware :Review of Systems  Neurological: Positive for focal weakness.  Psychiatric/Behavioral: Positive for depression.       Adjustment d/o  All other systems reviewed and are negative.    Objective: Vital Signs: Blood pressure 150/95, pulse 87, temperature 98.5 F (36.9 C), temperature source Oral, resp. rate 18, weight 99.8 kg (220 lb 0.3 oz), SpO2 92.00%. Dg Swallowing Func-no Report  12/22/2011  CLINICAL DATA: assess for silent penetration/aspiration   FLUOROSCOPY FOR SWALLOWING FUNCTION STUDY:  Fluoroscopy was provided for swallowing function study, which was  administered by a speech pathologist.  Final results and recommendations  from this study are contained within the speech pathology report.     Results for orders placed during the hospital encounter of 12/14/11 (from the past 72 hour(s))  GLUCOSE, CAPILLARY     Status: Abnormal   Collection Time   12/20/11  7:44 AM      Component Value Range Comment   Glucose-Capillary 197 (*) 70 - 99 (mg/dL)    Comment 1 Notify RN     GLUCOSE, CAPILLARY     Status: Abnormal   Collection Time   12/20/11 12:08 PM      Component Value Range Comment   Glucose-Capillary 272 (*) 70 - 99 (mg/dL)    Comment 1 Notify RN     GLUCOSE, CAPILLARY     Status: Abnormal   Collection Time   12/20/11  4:57 PM      Component Value Range Comment   Glucose-Capillary 251 (*) 70 - 99 (mg/dL)    Comment 1 Notify RN     GLUCOSE, CAPILLARY     Status: Abnormal   Collection Time   12/20/11  8:38 PM      Component Value Range Comment   Glucose-Capillary 198 (*) 70 - 99 (mg/dL)   GLUCOSE, CAPILLARY     Status: Abnormal   Collection Time   12/21/11  7:26 AM      Component Value Range Comment   Glucose-Capillary 180 (*) 70 - 99 (mg/dL)    Comment 1 Notify RN     GLUCOSE,  CAPILLARY     Status: Abnormal   Collection Time   12/21/11 12:07 PM      Component Value Range Comment   Glucose-Capillary 205 (*) 70 - 99 (mg/dL)    Comment 1 Notify RN     GLUCOSE, CAPILLARY     Status: Abnormal   Collection Time   12/21/11  4:19 PM      Component Value Range Comment   Glucose-Capillary 170 (*) 70 - 99 (mg/dL)    Comment 1 Notify RN     GLUCOSE, CAPILLARY     Status: Abnormal   Collection Time   12/21/11  9:32 PM      Component Value Range Comment   Glucose-Capillary 181 (*) 70 - 99 (mg/dL)    Comment 1 Notify RN     GLUCOSE, CAPILLARY     Status: Abnormal   Collection Time   12/22/11  8:49 AM      Component Value Range Comment   Glucose-Capillary 233 (*) 70 - 99 (mg/dL)    Comment 1 Notify RN     GLUCOSE, CAPILLARY     Status: Abnormal   Collection Time   12/22/11 11:33 AM  Component Value Range Comment   Glucose-Capillary 267 (*) 70 - 99 (mg/dL)    Comment 1 Notify RN     GLUCOSE, CAPILLARY     Status: Abnormal   Collection Time   12/22/11  5:05 PM      Component Value Range Comment   Glucose-Capillary 114 (*) 70 - 99 (mg/dL)    Comment 1 Notify RN     GLUCOSE, CAPILLARY     Status: Abnormal   Collection Time   12/22/11  9:25 PM      Component Value Range Comment   Glucose-Capillary 166 (*) 70 - 99 (mg/dL)    Comment 1 Notify RN       Gen:  NAD Mood/affect- no lability or aggitation Lung- Clear  Cor-RRR no Murmur Abd-soft NT to palpation Ext no C/C/E Motor: 0/5 in RUE, 2-/5 RLE Tone- increase in R finger flex, + R LE triple flexor but does have voluntary ext Assessment/Plan: 1. 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 Physiatrist is providing close team supervision and 24 hour management of active medical problems listed below. Physiatrist and rehab team continue to assess barriers to  discharge/monitor patient progress toward functional and medical goals. FIM: FIM - Bathing Bathing Steps Patient Completed: Chest;Abdomen;Right upper leg;Left upper leg Bathing: 1: Two helpers (Pt assisted with all activities, 2 helpers to stand)  FIM - Upper Body Dressing/Undressing Upper body dressing/undressing steps patient completed: Thread/unthread left sleeve of pullover shirt/dress (Pt assisted with over head and pulling shirt down) Upper body dressing/undressing: 1: Total-Patient completed less than 25% of tasks FIM - Lower Body Dressing/Undressing Lower body dressing/undressing: 1: Two helpers (Pt assisted with all activities with increased initiation)  FIM - Toileting Toileting: 1: Total-Patient completed zero steps, helper did all 3  FIM - Archivist Transfers: 0-Activity did not occur Toilet Transfers DO NOT USE: 0-Activity did not occur  FIM - Banker Devices: Sliding board Bed/Chair Transfer: 1: Supine > Sit: Total A (helper does all/Pt. < 25%);1: Sit > Supine: Total A (helper does all/Pt. < 25%)  FIM - Locomotion: Wheelchair Distance: 40 Locomotion: Wheelchair: 1: Total Assistance/staff pushes wheelchair (Pt<25%) FIM - Locomotion: Ambulation Locomotion: Ambulation: 0: Activity did not occur  Comprehension Comprehension Mode: Auditory Comprehension: 2-Understands basic 25 - 49% of the time/requires cueing 51 - 75% of the time  Expression Expression Mode: Verbal Expression: 2-Expresses basic 25 - 49% of the time/requires cueing 50 - 75% of the time. Uses single words/gestures.  Social Interaction Social Interaction: 2-Interacts appropriately 25 - 49% of time - Needs frequent redirection.  Problem Solving Problem Solving: 1-Solves basic less than 25% of the time - needs direction nearly all the time or does not effectively solve problems and may need a restraint for safety  Memory Memory: 1-Recognizes or  recalls less than 25% of the time/requires cueing greater than 75% of the time  2. Anticoagulation/DVT prophylaxis with Pharmaceutical: Lovenox Medical Problem List and Plan:  1. DVT Prophylaxis/Anticoagulation: Mechanical: Antiembolism stockings, knee (TED hose) Bilateral lower extremities  Sequential compression devices, below knee Bilateral lower extremities. Lovenox  2. Pain Management: Utilize Tylenol generously as well as ice and heat. Can use hydrocodone for more significant pain. His pain is better controlled. He denies any pain today.  3. Mood: Celexa. Team wanted to provide ego support as patient does appear depressed. Depression screening per  social work.  4. HTN: resumed low dose ace. Good control overall but last 2 readings elevated will monitor and adjust meds BP Readings from Last 3 Encounters:  12/23/11 150/95  12/14/11 147/89  12/14/11 147/89    5. DM: continue CBG checks.On Ensure.  Resume metformin and use SSI BS control. Increase Lantus--to 25 units today 6. Insomnia: When necessary trazodone. Follow sleep patterns.  7. Leukocytosis: Reactive? Monitor for fevers 8.  Spasticity trial Zanaflex monitor sedation and orthostasis appears improved on oral meds LOS (Days) 9 A FACE TO FACE EVALUATION WAS PERFORMED  Samreet Edenfield E 12/23/2011, 6:59 AM

## 2011-12-23 NOTE — Progress Notes (Signed)
Occupational Therapy Session Note  Patient Details  Name: Darrell [name] MRN: [id number] Date of Birth: Feb 12, 1946  Today's Date: 12/23/2011 Time: 1308-6578 Time Calculation (min): 58 min  Short Term Goals: Week 1:  OT Short Term Goal 1 (Week 1): Pt will complete UB bathing with mod assist with mod verbal cues for initiation OT Short Term Goal 2 (Week 1): Pt will complete UB dressing with mod assist with mod verbal cues for initiation OT Short Term Goal 3 (Week 1): Pt will complete LB bathing with max assist with mod verbal cues for initiation OT Short Term Goal 4 (Week 1): Pt will complete toilet transfer with max assist with mod verbal cues for initiation  Skilled Therapeutic Interventions/Progress Updates:    Pt seen for ADL retraining at sink level.  Focus on bed mobility, squat pivot transfer, sit <> stand, increased initiation with bathing.  Pt with increased alertness and engagement this session, continuing to require increased time for initiation however not taking as long as prior sessions.  Pt able to follow one-step commands with bathing with increased participation in task.  Able to complete bathing of legs with use of step stool and setup assist to initiate task with pt completing follow through.  Pt with increased awareness of RUE with independently bathing it and readjusting position of RUE during bathing task.  Squat pivot transfer to Lt (strong side) to return to bed with pt demonstrating increased initiation and doing approx 30% of lifting, still requiring +2 assist for steadying and manual facilitation of weight shift.  Therapy Documentation Precautions:  Precautions Precautions: Fall Precaution Comments: motor apraxia, right hemiplegia, delayed initiation, right LE flexor withdrawl Required Braces or Orthoses: No Restrictions Weight Bearing Restrictions: No Other Position/Activity Restrictions: monitor O2 during mobility General:   Vital Signs: Therapy  Vitals Temp: 98.5 F (36.9 C) Temp src: Oral Pulse Rate: 87  Resp: 18  BP: 150/95 mmHg Patient Position, if appropriate: Lying Oxygen Therapy SpO2: 92 % O2 Device: None (Room air) Pain: Pain Assessment Pain Assessment: No/denies pain Pain Score: 0-No pain  See FIM for current functional status  Therapy/Group: Individual Therapy  [name] 12/23/2011, 10:19 AM

## 2011-12-23 NOTE — Progress Notes (Signed)
Discharge summary # [phone]

## 2011-12-23 NOTE — Plan of Care (Signed)
Problem: RH BLADDER ELIMINATION Goal: RH STG MANAGE BLADDER WITH ASSISTANCE STG Manage Bladder With Mod Assistance  Outcome: Not Progressing Incontinent of urine, total assist of staff for hygiene and clothes management

## 2011-12-23 NOTE — Plan of Care (Signed)
Problem: RH BOWEL ELIMINATION Goal: RH STG MANAGE BOWEL WITH ASSISTANCE STG Manage Bowel with Max Assistance  Outcome: Not Progressing Incontinent of stool, total assist

## 2011-12-23 NOTE — Progress Notes (Signed)
Social Work Patient ID: Darrell [name], male   DOB: 22-May-1946, 66 y.o.   MRN: [id number]   Met with wife to inform bed offers via [facility] and [facility]. Wife reports first choice is [facility] She will contact Jasmine December regarding admission paperwork.  Both pt and wife agreeable to transfer tomorrow to [facility]. Will gather paperwork and work toward discharge.  MD reports no medical issues.  Team aware.

## 2011-12-23 NOTE — Discharge Summary (Signed)
NAMECHRISTOPHE, RISING NO.:  [id number]  MEDICAL RECORD NO.:  [id number]  LOCATION:  4028                         FACILITY:  [facility]  PHYSICIAN:  Erick Colace, M.D. DATE OF BIRTH:  1946-04-29  DATE OF ADMISSION:  12/14/2011 DATE OF DISCHARGE:  12/24/2011                              DISCHARGE SUMMARY   DISCHARGE DIAGNOSES: 1. Left MCA PCA thromboembolic infarcts. 2. Left carotid endarterectomy. 3. Hypertension. 4. Diabetes mellitus type 2. 5. Insomnia. 6. Depression. 7. Spasticity.  HISTORY OF PRESENT ILLNESS:  [name] is a 66 year old male with history of diabetes mellitus, hypertension, severe left carotid artery stenosis admitted December 09, 2011 for left CA by Dr. Hart Rochester, in recovery.  The patient with decreased level of consciousness and inability to move right side.  He was taken back to OR emergently for exploration of left CA with thrombectomy of endarterectomized surface  And distal ICA.  Neurology and Heme/Onc was consulted for input.  MRI/MRA of brain done, revealed nonhemorrhagic infarcts in left MCA PCA distribution and focal infarct left basal ganglia.  No significant stenosis.  Hypercoag studies were ordered and the patient was noted to have positive lupus anticoagulant, but not reliable due to proximity of treatment with heparin.  The patient is on aspirin per Neurology recommendations.  MBS was done on December 11, 2011 and the patient was started on D1 diet nectar liquids.  The patient with improvement in expressive aphasia.  He is able to complete 1 step command with moderate verbal cues.  Continues with left gaze preference with right hemiparesis and sensory deficits. The patient was evaluated by rehab and we felt that he would benefit from a CIR program.  FUNCTIONAL HISTORY: Independent and working full time PTA. Drives.  No assistive device needed.  FUNCTIONAL STATUS: Total assist 10-20% for bed mobility. Total  assist 30% with RLE blocked for transfers. Max assist for grooming. Total assist 30% for toileting. Right neglect with decreased awareness of deficits and needs assistance For problem solving.  Perseverative and nods yes to most questions and  Follow one step commands inconsistently.  LABORATORY DATA:  Pertinent labs; CBC of 218 revealed sodium 136, potassium 3.9, chloride 102, CO2 of 23, BUN 16, creatinine 0.65, glucose 201.  CBC of 218, hemoglobin 12.1, hematocrit 36.9, white count 13.1, platelets 273.  UA/UC done.  Urine culture showed no growth.  HOSPITAL COURSE:  [name] was admitted to rehab on December 14, 2011 for inpatient therapies to consist of PT, OT, and speech therapy at least 3 hours 5 days a week.  Past admission, physiatrist, rehab, RN, and therapy team have worked together to provide customized collaborative interdisciplinary care.  Rehab RN has worked with the patient on bowel and bladder program as well as safety issues.  Due to a history of sleep apnea, CPAP was ordered for use during this stay; however, the patient has refused this at times.  [name] diabetes has been monitored with a.c. and at bedtime CBG checks.  Blood sugars in the last 24 hours are ranging from 160 to 267 range.  [name] Lantus insulin is slowly being titrated upwards with sliding scale insulin being used for  elevated blood sugars.  Blood pressures have been checked on b.i.d. basis.  These have been trending upwards in the last 24 hours with systolics at 140 to 150s, diastolic in 90s range.  Heart rate stable in 70s to 80s range.  Renal status has been monitored along during this stay due to his dysphagia and thickened liquids.  He is noted to have adequate hydration.  Follow up MBS was done on December 22, 2011, revealing the patient with mild sensorimotor, oral and pharyngeal dysphagia with delayed swallow initiation with mild aspiration of thin liquids with straw and  silent increased penetration of thin liquids via cup sips.  Given that penetration was minimal and the patient was able to clear with cued coughed, the patient was advanced to D3 diet with thin liquids with full supervision to ensure strict use of compensate strategies.  The patient needs to be upright 90 degrees for all meals and take small sips and bites and have multiple dry swallows after each bite.  During patient's stay in rehab, weekly team conferences were held to monitor the patient's progress, set goals, as well as discuss barriers to discharge.  At admission, therapy evaluation revealed the patient requiring total assist +2 with mobility due to generalized muscle weakness, dominant right-sided hemiplegia.  He was noted to have impaired timing and sequencing for muscle activation with increase in lower extremity tone with 2 beats clonus right ankle, significant motor apraxia with decreased motor planning, decrease attention to right, decreased initiation of movement.  He was also noted to have delayed processing time with frequent loss of balance to the right and decreased postural control as well as decreased activity tolerance.  He required total assist with basic self-care tasks.  Speech therapy evaluation revealed the patient with impaired cognition, impaired attention all poor frustration tolerance.  He was 26-50% accurate for basic biographical information, was able to follow one-step basic commands at 25% accuracy, was noted to be more responsive via facial expressions and laughed when wife was present, also was noted to be more attentive.  He required extra processing time.  All cognitive communicated skills were impacted by internal distraction related to pain as well as perseverations echolalia and decreased attention level. The patient was noted to develop pain and spasticity on left side and he was started on Zanaflex to help with his symptoms.  UA/UC was done  past admission due to leukocytosis and urine culture was negative.  Therapy are currently focusing on initiation of movement and anterior weight shifting, standing is SARA plus with dynamic balance activity.  The patient has progressed to gait of 15 feet with SARA plus with good step response bilaterally.  OT has focused on bed mobility, squat pivot transfers as well as increasing initiation with bathing.  The patient is showing increased alertness and engagement.  He continues to require increased time.  For initiation, he is able to follow one-step command with his bathing tasks with increased participation.  He is showing increased awareness of right upper extremity.  Speech therapy has been ongoing with focus on dysphagia as well as safety with self feeding. They have also focused on functional expression of wants and needs. Currently, the patient requires max assist with semantic cues to utilize safe swallow compensatory strategies.  He requires total assist for selective familiar objects in a field of 3.  He requires mod assist with semantic and phonemic cues to name basic objects.  The patient with limited vocabulary and vague expression of wants  and needs.  Due to his current deficits and progress, wife has elected on SNF for further therapies.  Bed is available at [facility] for December 24, 2011 and the patient is to be discharged to this facility in improved condition.  DISCHARGE MEDICATIONS: 1. Biotene oral rinse b.i.d. past meals. 2. Coated aspirin 325 mg a day. 3. Celexa 10 mg p.o. per day. 4. Ensure supplements b.i.d. 5. Lantus insulin 25 units subcu at bedtime. 6. Prinivil 5 mg p.o. per day. 7. Metformin 1000 mg b.i.d. 8. Ritalin 10 mg p.o. every 7 a.m. at noon-#10 RX. 9. Zanaflex 2 mg p.o. t.i.d.  DIET:  D3 diet, thin liquids, carb modified medium, menthol and puree, no straws, needs to be upright for meals.  Full supervision by staff.  SPECIAL INSTRUCTIONS:   CPAP with humidified full face mask at bedtime or whenever asleep.  CBG checks a.c. and at bedtime with sliding scale insulin per protocol.  Progressive PT, OT, speech therapy to continue past discharge.  FOLLOWUP:  The patient to follow up with MD at [facility] for medical issues. Follow up with Dr. [name] for postop check.  Follow up with Dr. Wynn Banker on April 9th 10:30 for 11 am appointment.     Delle Reining, P.A.   ______________________________ Erick Colace, M.D.    PL/MEDQ  D:  12/23/2011  T:  12/23/2011  Job:  [id number]  cc:   Tinnie Gens A. Tawanna Cooler, MD Pramod P. Pearlean Brownie, MD Quita Skye Hart Rochester, M.D.

## 2011-12-23 NOTE — Plan of Care (Signed)
Problem: RH KNOWLEDGE DEFICIT Goal: RH STG INCREASE KNOWLEDGE OF HYPERTENSION Verbalizes importance of medication and diet to control hypertension  Outcome: Not Progressing Pt able not tot express any information related to HTN or management of

## 2011-12-24 DIAGNOSIS — G8110 Spastic hemiplegia affecting unspecified side: Secondary | ICD-10-CM | POA: Diagnosis not present

## 2011-12-24 DIAGNOSIS — E119 Type 2 diabetes mellitus without complications: Secondary | ICD-10-CM | POA: Diagnosis not present

## 2011-12-24 DIAGNOSIS — G8191 Hemiplegia, unspecified affecting right dominant side: Secondary | ICD-10-CM | POA: Diagnosis present

## 2011-12-24 DIAGNOSIS — I6990 Unspecified sequelae of unspecified cerebrovascular disease: Secondary | ICD-10-CM | POA: Diagnosis not present

## 2011-12-24 DIAGNOSIS — I10 Essential (primary) hypertension: Secondary | ICD-10-CM | POA: Diagnosis not present

## 2011-12-24 DIAGNOSIS — I519 Heart disease, unspecified: Secondary | ICD-10-CM | POA: Diagnosis not present

## 2011-12-24 DIAGNOSIS — M25539 Pain in unspecified wrist: Secondary | ICD-10-CM | POA: Diagnosis not present

## 2011-12-24 DIAGNOSIS — K5900 Constipation, unspecified: Secondary | ICD-10-CM | POA: Diagnosis not present

## 2011-12-24 DIAGNOSIS — I6529 Occlusion and stenosis of unspecified carotid artery: Secondary | ICD-10-CM | POA: Diagnosis not present

## 2011-12-24 DIAGNOSIS — F329 Major depressive disorder, single episode, unspecified: Secondary | ICD-10-CM | POA: Diagnosis not present

## 2011-12-24 DIAGNOSIS — I119 Hypertensive heart disease without heart failure: Secondary | ICD-10-CM | POA: Diagnosis not present

## 2011-12-24 DIAGNOSIS — I6330 Cerebral infarction due to thrombosis of unspecified cerebral artery: Secondary | ICD-10-CM | POA: Diagnosis not present

## 2011-12-24 DIAGNOSIS — Z48812 Encounter for surgical aftercare following surgery on the circulatory system: Secondary | ICD-10-CM | POA: Diagnosis not present

## 2011-12-24 DIAGNOSIS — G4730 Sleep apnea, unspecified: Secondary | ICD-10-CM | POA: Diagnosis not present

## 2011-12-24 DIAGNOSIS — I69959 Hemiplegia and hemiparesis following unspecified cerebrovascular disease affecting unspecified side: Secondary | ICD-10-CM | POA: Diagnosis not present

## 2011-12-24 DIAGNOSIS — J189 Pneumonia, unspecified organism: Secondary | ICD-10-CM | POA: Diagnosis not present

## 2011-12-24 DIAGNOSIS — G459 Transient cerebral ischemic attack, unspecified: Secondary | ICD-10-CM | POA: Diagnosis not present

## 2011-12-24 DIAGNOSIS — G4700 Insomnia, unspecified: Secondary | ICD-10-CM | POA: Diagnosis not present

## 2011-12-24 DIAGNOSIS — Z5189 Encounter for other specified aftercare: Secondary | ICD-10-CM | POA: Diagnosis not present

## 2011-12-24 LAB — BASIC METABOLIC PANEL
BUN: 16 mg/dL (ref 6–23)
CO2: 26 mEq/L (ref 19–32)
Calcium: 9.6 mg/dL (ref 8.4–10.5)
Creatinine, Ser: 0.68 mg/dL (ref 0.50–1.35)

## 2011-12-24 LAB — GLUCOSE, CAPILLARY: Glucose-Capillary: 136 mg/dL — ABNORMAL HIGH (ref 70–99)

## 2011-12-24 MED ORDER — LISINOPRIL 5 MG PO TABS
5.0000 mg | ORAL_TABLET | Freq: Two times a day (BID) | ORAL | Status: DC
  Filled 2011-12-24 (×2): qty 1

## 2011-12-24 NOTE — Progress Notes (Signed)
Social Work Discharge Note Discharge Note  The overall goal for the admission was met for:   Discharge location: [facility] Length of Stay: Yes-10 DAYS  Discharge activity level: Yes-MOD LEVEL  Home/community participation: Yes  Services provided included: MD, RD, PT, OT, SLP, RN, CM, TR, Pharmacy and SW  Financial Services: Medicare and Private Insurance: [employer]  Follow-up services arranged: Other: SHORT TERM NHP  Comments (or additional information): WIFE VERY SUPPORTIVE PT NEEDS TO BE AT A HIGHER LEVEL TO RETURN HOME  Patient/Family verbalized understanding of follow-up arrangements: Yes  Individual responsible for coordination of the follow-up plan: JANE-WIFE  Confirmed correct DME delivered: Lucy Chris 12/24/2011    Lucy Chris

## 2011-12-24 NOTE — Progress Notes (Signed)
Speech Language Pathology Daily Session Note & Discharge  Patient Details  Name: Darrell [name] MRN: [id number] Date of Birth: 1946/01/16  Today's Date: 12/24/2011 Time: 0800-0900 Time Calculation (min): 60 min  Short Term Goals: Week 1: SLP Short Term Goal 1 (Week 1): Patient will sustain attention in basic ADL task for 2-3 minutes with maximum verbal/tactile/contextual cues. SLP Short Term Goal 2 (Week 1): Patient will follow basic 1 step commands in familiar, functional ADL task with maximum verbal/tactile/contextual cues. SLP Short Term Goal 3 (Week 1): Patient will verbalize basic wants/needs 75% of the time with maximum semantic cues. SLP Short Term Goal 4 (Week 1): Patient will verbalize automatic/biographical information at 2-3 word level with moderate semantic or sentence completion cues. SLP Short Term Goal 5 (Week 1): Patient will solve basic problems in automatic, rote, familiar ADL tasks with maximum verbal/tactile/contextual cues.  Skilled Therapeutic Interventions: Session focused on skilled treatment of swallowing, basic express of wants and needs and problem solving with self feeding.  SLP facilitated session with set up assist of containers, cutting food and max assist semantic cues to utilize 2 swallows per sip/bite and follow sips with a hard cough as an airway protections strategy due to know penetration per MBSS; however, cues elicited a throat clear.  Patient demonstrated a strong reflexive cough response x2 throughout meal which appeared to remove penetrates.     SLP facilitated self feeding with mod assist visual, demonstrative cues to sequence/problem solve switching between utensils and use of right upper extremity with cup and finger food.    Patient's verbal expression is characterized by phrase-sentence level expression with vague expression "I am going to do it, I need help" but then he is unable to explain what he wants, needs to do, needs help with, or what is  hurting.  Patient also exhibits receptive deficits which further impact his ability to communicate and follow 1-step directions.  Visual, demonstrative cues are effective at facilitating 1-step directions and semantic cues paired with phonemic cues are effective at facilitating naming.  Daily Session Precautions/Restrictions   Dys.3 textures and thin liquids via cup with 2 swallows per bite/sip and cough after sips as a airway protection strategy FIM:  Comprehension Comprehension Mode: Auditory Comprehension: 2-Understands basic 25 - 49% of the time/requires cueing 51 - 75% of the time Expression Expression Mode: Verbal Expression: 3-Expresses basic 50 - 74% of the time/requires cueing 25 - 50% of the time. Needs to repeat parts of sentences. Social Interaction Social Interaction: 4-Interacts appropriately 75 - 89% of the time - Needs redirection for appropriate language or to initiate interaction. Problem Solving Problem Solving: 2-Solves basic 25 - 49% of the time - needs direction more than half the time to initiate, plan or complete simple activities Memory Memory: 2-Recognizes or recalls 25 - 49% of the time/requires cueing 51 - 75% of the time FIM - Eating Eating Activity: 5: Supervision/cues Pain Pain Assessment Pain Assessment: No/denies pain Pain Score: 0-No pain Cognition: Overall Cognitive Status: Impaired  Arousal/Alertness: Awake/alert  Orientation Level: Oriented to person;Disoriented to time;Disoriented to situation;Disoriented to place  Attention: Sustained  Sustained Attention: Impaired (basic, familiar task for 2-3 minutes)  Sustained Attention Impairment: Functional basic  Memory: Impaired  Memory Impairment: Retrieval deficit;Storage deficit;Decreased recall of new information;Decreased short term memory (difficult to assess due to language impairment)  Decreased Long Term Memory: Verbal basic  Decreased Short Term Memory: Verbal basic;Functional basic    Awareness: Impaired  Awareness Impairment: Intellectual impairment  Problem Solving:  Impaired  Problem Solving Impairment: Verbal basic;Functional basic  Executive Function: (max assist)  Initiating: Impaired (mod-max)  Initiating Impairment: Verbal basic;Functional basic  Behaviors: Perseveration;Poor frustration tolerance  Safety/Judgment: Impaired  Oral/Motor: Oral Motor/Sensory Function  Overall Oral Motor/Sensory Function: Impaired  Labial ROM: Within Functional Limits  Labial Symmetry: Within Functional Limits  Labial Strength: Within Functional Limits  Labial Sensation: Reduced  Lingual ROM: Within Functional Limits  Lingual Symmetry: Within Functional Limits  Lingual Strength: Reduced  Lingual Sensation: Within Functional Limits  Facial ROM: Within Functional Limits  Facial Symmetry: Within Functional Limits  Facial Strength: Within Functional Limits  Facial Sensation: Reduced  Mandible: Within Functional Limits  Motor Speech  Overall Motor Speech: Appears within functional limits for tasks assessed  Respiration: Within functional limits  Phonation: Normal  Resonance: Within functional limits  Articulation: Within functional limitis  Intelligibility: Intelligible  Motor Planning: Witnin functional limits  Comprehension: Auditory Comprehension  Overall Auditory Comprehension: Impaired  Yes/No Questions: Impaired  Basic Biographical Questions: 26-50% accurate  Commands: Impaired  One Step Basic Commands: 25-49% accurate  Conversation: Simple  Other Conversation Comments: Continues to respond to visual information and facial expression  Interfering Components: Attention;Pain;Anxiety;Processing speed;Working Actor: Insurance account manager: Not tested  Common Objects: Unable to indentify  Reading Comprehension  Reading Status: Impaired  Word level: Impaired (days of  the week; name tag)  Expression: Expression  Primary Mode of Expression: Verbal  Verbal Expression  Overall Verbal Expression: Impaired  Initiation: Impaired  Automatic Speech: Name  Level of Generative/Spontaneous Verbalization: Word;Phrase  Repetition: No impairment  Naming: Impairment  Responsive: 0-25% accurate  Confrontation: Impaired  Common Objects: Unable to indentify  Divergent: 0-24% accurate  Other Naming Comments: semantic and phonemis cues effective when pain is not present  Verbal Errors: Perseveration;Echolalia;Not aware of errors  Pragmatics: Impairment  Impairments: Eye contact  Interfering Components: Attention  Effective Techniques: Phonemic cues;Written cues  Non-Verbal Means of Communication: Not applicable  Other Verbal Expression Comments: paient with limited vocabulary and vague expression of wants and needs  Written Expression  Dominant Hand: Right  Written Expression: Unable to assess (comment)  Therapy/Group: Individual Therapy   Speech Language Pathology Discharge Summary  Patient Details  Name: Darrell [name] MRN: [id number] Date of Birth: 12-11-1945  Patient has met 4 of 13 long term goals due to gains in swallow function, orientation and awareness.  Patient to discharge at overall Mod-Max Assist level.  Patient's care partner requires assistance and unavailable to provide the necessary cognitive and linguistic assistance at discharge.  Reasons goals not met: discharge plan changed to SNF placement  Recommendation:  Patient will benefit from ongoing skilled SLP services in skilled nursing facility setting to continue to advance functional skills in the area of functional communication, maximize cognitive function and overall independence, swallowing dafety with least restrictive diet and reduce care partner burden.  Equipment: none  Reasons for discharge: discharge from hospital per family request with discharge plan changed to  SNF.  Patient/family agrees with progress made and goals achieved: Yes  See FIM for current functional status  Charlane Ferretti., CCC-SLP [phone]

## 2011-12-24 NOTE — Plan of Care (Signed)
Problem: RH Wheelchair Mobility Goal: LTG Patient will propel w/c in home environment (PT) LTG: Patient will propel wheelchair in home environment, # of feet with assistance (PT).  Outcome: Not Applicable Date Met:  12/24/11 D/C goal due to d/c to SNF setting. 12/24/11 RH

## 2011-12-24 NOTE — Progress Notes (Signed)
Patient ID: Darrell [name], male   DOB: 06-24-1946, 66 y.o.   MRN: [id number] Subjective/Complaints Awake but continues to be disoriented except was able to say [facility].PT notes flexor withdrawal RLE.  Inc of stool and unaware :Review of Systems  Neurological: Positive for focal weakness.  Psychiatric/Behavioral: Positive for depression.       Adjustment d/o  All other systems reviewed and are negative.    Objective: Vital Signs: Blood pressure 150/93, pulse 76, temperature 98.3 F (36.8 C), temperature source Oral, resp. rate 18, weight 103.5 kg (228 lb 2.8 oz), SpO2 98.00%. Dg Swallowing Func-no Report  12/22/2011  CLINICAL DATA: assess for silent penetration/aspiration   FLUOROSCOPY FOR SWALLOWING FUNCTION STUDY:  Fluoroscopy was provided for swallowing function study, which was  administered by a speech pathologist.  Final results and recommendations  from this study are contained within the speech pathology report.     Results for orders placed during the hospital encounter of 12/14/11 (from the past 72 hour(s))  GLUCOSE, CAPILLARY     Status: Abnormal   Collection Time   12/21/11  7:26 AM      Component Value Range Comment   Glucose-Capillary 180 (*) 70 - 99 (mg/dL)    Comment 1 Notify RN     GLUCOSE, CAPILLARY     Status: Abnormal   Collection Time   12/21/11 12:07 PM      Component Value Range Comment   Glucose-Capillary 205 (*) 70 - 99 (mg/dL)    Comment 1 Notify RN     GLUCOSE, CAPILLARY     Status: Abnormal   Collection Time   12/21/11  4:19 PM      Component Value Range Comment   Glucose-Capillary 170 (*) 70 - 99 (mg/dL)    Comment 1 Notify RN     GLUCOSE, CAPILLARY     Status: Abnormal   Collection Time   12/21/11  9:32 PM      Component Value Range Comment   Glucose-Capillary 181 (*) 70 - 99 (mg/dL)    Comment 1 Notify RN     GLUCOSE, CAPILLARY     Status: Abnormal   Collection Time   12/22/11  8:49 AM      Component Value Range Comment   Glucose-Capillary  233 (*) 70 - 99 (mg/dL)    Comment 1 Notify RN     GLUCOSE, CAPILLARY     Status: Abnormal   Collection Time   12/22/11 11:33 AM      Component Value Range Comment   Glucose-Capillary 267 (*) 70 - 99 (mg/dL)    Comment 1 Notify RN     GLUCOSE, CAPILLARY     Status: Abnormal   Collection Time   12/22/11  5:05 PM      Component Value Range Comment   Glucose-Capillary 114 (*) 70 - 99 (mg/dL)    Comment 1 Notify RN     GLUCOSE, CAPILLARY     Status: Abnormal   Collection Time   12/22/11  9:25 PM      Component Value Range Comment   Glucose-Capillary 166 (*) 70 - 99 (mg/dL)    Comment 1 Notify RN     GLUCOSE, CAPILLARY     Status: Abnormal   Collection Time   12/23/11  7:23 AM      Component Value Range Comment   Glucose-Capillary 154 (*) 70 - 99 (mg/dL)    Comment 1 Notify RN     GLUCOSE, CAPILLARY  Status: Abnormal   Collection Time   12/23/11 11:41 AM      Component Value Range Comment   Glucose-Capillary 174 (*) 70 - 99 (mg/dL)    Comment 1 Notify RN     GLUCOSE, CAPILLARY     Status: Abnormal   Collection Time   12/23/11  4:35 PM      Component Value Range Comment   Glucose-Capillary 163 (*) 70 - 99 (mg/dL)    Comment 1 Notify RN     GLUCOSE, CAPILLARY     Status: Abnormal   Collection Time   12/23/11  9:35 PM      Component Value Range Comment   Glucose-Capillary 147 (*) 70 - 99 (mg/dL)     Gen:  NAD Mood/affect- no lability or aggitation Lung- Clear  Cor-RRR no Murmur Abd-soft NT to palpation Ext no C/C/E Motor: 0/5 in RUE, 2-/5 RLE.  Triple flexor response RLE Tone- increase in R finger flex, + R LE triple flexor but does have voluntary ext Assessment/Plan: 1. 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 Physiatrist is providing close team supervision and 24 hour management of active medical problems listed  below. Physiatrist and rehab team continue to assess barriers to discharge/monitor patient progress toward functional and medical goals. FIM: FIM - Bathing Bathing Steps Patient Completed: Chest;Right Arm;Abdomen;Right upper leg;Left upper leg Bathing: 3: Mod-Patient completes 5-7 108f 10 parts or 50-74%  FIM - Upper Body Dressing/Undressing Upper body dressing/undressing steps patient completed: Thread/unthread left sleeve of pullover shirt/dress Upper body dressing/undressing: 1: Total-Patient completed less than 25% of tasks FIM - Lower Body Dressing/Undressing Lower body dressing/undressing: 1: Two helpers (Pt assisted with all activities +2 to stand)  FIM - Toileting Toileting: 1: Total-Patient completed zero steps, helper did all 3  FIM - Archivist Transfers: 0-Activity did not occur Toilet Transfers DO NOT USE: 0-Activity did not occur  FIM - Banker Devices: Sliding board (slide board to Rt, squat pivot to Lt) Bed/Chair Transfer: 1: Sit > Supine: Total A (helper does all/Pt. < 25%);1: Chair or W/C > Bed: Total A (helper does all/Pt. < 25%);1: Bed > Chair or W/C: Total A (helper does all/Pt. < 25%)  FIM - Locomotion: Wheelchair Distance: 40 Locomotion: Wheelchair: 1: Total Assistance/staff pushes wheelchair (Pt<25%) FIM - Locomotion: Ambulation Locomotion: Ambulation Assistive Devices: Sara Plus Locomotion: Ambulation: 1: Travels less than 50 ft with total assistance/helper does all (Pt.<25%)  Comprehension Comprehension Mode: Auditory Comprehension: 2-Understands basic 25 - 49% of the time/requires cueing 51 - 75% of the time  Expression Expression Mode: Verbal Expression: 2-Expresses basic 25 - 49% of the time/requires cueing 50 - 75% of the time. Uses single words/gestures.  Social Interaction Social Interaction: 3-Interacts appropriately 50 - 74% of the time - May be physically or verbally inappropriate.  Problem  Solving Problem Solving: 2-Solves basic 25 - 49% of the time - needs direction more than half the time to initiate, plan or complete simple activities  Memory Memory: 1-Recognizes or recalls less than 25% of the time/requires cueing greater than 75% of the time  2. Anticoagulation/DVT prophylaxis with Pharmaceutical: Lovenox Medical Problem List and Plan:  1. DVT Prophylaxis/Anticoagulation: Mechanical: Antiembolism stockings, knee (TED hose) Bilateral lower extremities  Sequential compression devices, below knee Bilateral lower extremities. Lovenox  2. Pain Management: Utilize Tylenol generously as well as ice and heat.  Can use hydrocodone for more significant pain. His pain is better controlled. He denies any pain today.  3. Mood: Celexa. Team wanted to provide ego support as patient does appear depressed. Depression screening per social work.  4. HTN: resumed low dose ace. Good control overall but last 2 readings elevated will monitor and adjust meds BP Readings from Last 3 Encounters:  12/24/11 150/93  12/14/11 147/89  12/14/11 147/89    5. DM: continue CBG checks.On Ensure.  Resume metformin and use SSI BS control. Increase Lantus--to 25 units today 6. Insomnia: When necessary trazodone. Follow sleep patterns.  7. Leukocytosis: Reactive? Monitor for fevers 8.  Spasticity trial Zanaflex monitor sedation and orthostasis appears improved on oral meds LOS (Days) 10 A FACE TO FACE EVALUATION WAS PERFORMED  KIRSTEINS,ANDREW E 12/24/2011, 7:22 AM

## 2011-12-24 NOTE — Plan of Care (Signed)
Problem: RH KNOWLEDGE DEFICIT Goal: RH STG INCREASE KNOWLEDGE OF DIABETES Verbalizes importance of medication and diet to control diabetes Outcome: Not Met (add Reason) Patient unable to verbalize importance of diabetic meds or rationale of diabetes control .  Wife is aware of diabetes information. Goal: RH STG INCREASE KNOWLEDGE OF HYPERTENSION Verbalizes importance of medication and diet to control hypertension  Outcome: Not Met (add Reason) Patient impaired cognition unable to verbalize any info about hypertension

## 2011-12-24 NOTE — Progress Notes (Signed)
Physical Therapy Session Note  Patient Details  Name: Darrell [name] MRN: [id number] Date of Birth: 1946-04-27  Today's Date: 12/24/2011 Time: 4098-1191 Time Calculation (min): 45 min  Short Term Goals: Week 2:  PT Short Term Goal 1 (Week 2): Pt will transfer supine to sit with max A 2/3 trials with cues (to R or left, whichever pt is more I at) PT Short Term Goal 1 - Progress (Week 2): Progressing toward goal PT Short Term Goal 2 (Week 2): Pt will transfer bed to/from w/c with max A pt >=40% 2/3 trials with cues PT Short Term Goal 2 - Progress (Week 2): Progressing toward goal PT Short Term Goal 3 (Week 2): Pt will particiapte in seated  balance activity 6 minutes without LOB to demonstrate improved activity tolerance and attention to task PT Short Term Goal 3 - Progress (Week 2): Progressing toward goal PT Short Term Goal 4 (Week 2): Pt stand with total A +2 pt =25% PT Short Term Goal 4 - Progress (Week 2): Progressing toward goal PT Short Term Goal 5 (Week 2): Pt will propel w/c in controlled evironment 60 ft with min A with mod cues PT Short Term Goal 5 - Progress (Week 2): Progressing toward goal  Skilled Therapeutic Interventions/Progress Updates:     Therapy Documentation Precautions:  Precautions Precautions: Fall Precaution Comments: motor apraxia, right hemiplegia, delayed initiation, right LE flexor withdrawl Required Braces or Orthoses: No Restrictions Weight Bearing Restrictions: No Other Position/Activity Restrictions: monitor O2 during mobility General: Chart Reviewed: Yes Amount of Missed PT Time (min): 15 Minutes (bowel complications) Family/Caregiver Present: No Vital Signs: Therapy Vitals Pulse Rate: 76  BP: 150/93 mmHg Patient Position, if appropriate: Lying Oxygen Therapy SpO2: 98 % O2 Device: None (Room air) Pain: Pain Assessment Pain Assessment: No/denies pain Pain Score: 0-No pain    Treatments:  Patient supine in bed upon entry. Alert and  more socially interactive this morning with improved sustained attention noted. Supine right LE stretches, emphasis on ankle, to prevent contracture. Bed mobility rolling left and right, focus on initiating functional movement, sequencing, and typical movement patterns min assist with rail. Supine-sit mod assist, manual facilitation at pelvis for terminal lateral weight shift to achieve sitting. No closing of eyes or reports of dizziness following transition this morning. Seated dynamic balance activities with focus on sustained attention to task and functional movement patterns close supervision x 5 min. Patient's stomach began to audibly gurgle, patient stated, "uh oh, I need to have a bowel movement." Returned to supine with max assist, bridges to doff pants and positioned on bedpan. Patient initiated bridge movement and doffing pants with left UE, needed assist to complete. Continent bowel movement into bedpan. Loose stool noted again this morning. RN aware.  See FIM for current functional status  Therapy/Group: Individual Therapy  Romeo Rabon 12/24/2011, 9:57 AM

## 2011-12-24 NOTE — Progress Notes (Signed)
Occupational Therapy Session Note  Patient Details  Name: Darrell [name] MRN: [id number] Date of Birth: [birth year]  Today's Date: 12/24/2011 Time: 4098-1191 Time Calculation (min): 60 min  Short Term Goals: Week 1:  OT Short Term Goal 1 (Week 1): Pt will complete UB bathing with mod assist with mod verbal cues for initiation OT Short Term Goal 2 (Week 1): Pt will complete UB dressing with mod assist with mod verbal cues for initiation OT Short Term Goal 3 (Week 1): Pt will complete LB bathing with max assist with mod verbal cues for initiation OT Short Term Goal 4 (Week 1): Pt will complete toilet transfer with max assist with mod verbal cues for initiation  Skilled Therapeutic Interventions/Progress Updates:    Pt seen for ADL retraining at sink level.  Pt on bedpan upon arrival, requiring mod assist with rolling and total assist with cleaning.  Pt with increased attention to task and alertness this session.  Focus on squat pivot transfer with +2 with second person stabilizing w/c with +1 max assist (lift and lower) with pt participating approx 40% with coming forward and initiating lift.  Pt with increased sequencing with bathing this session with min cues for initiation of washing chest, progressing to stomach and right arm.  Pt with increased initiation with weight shifting for sit to stand and with LB bathing with use of stool to minimize movement induced dizziness.  Pt very appreciative of therapist for help and repeatedly thanking her.  Pt with sit to stand max assist this session +1 with +2 to complete clothing management.  Therapy Documentation Precautions:  Precautions Precautions: Fall Precaution Comments: motor apraxia, right hemiplegia, delayed initiation, right LE flexor withdrawal Required Braces or Orthoses: No Restrictions Weight Bearing Restrictions: No Other Position/Activity Restrictions: monitor O2 during mobility General:   Vital Signs:   Pain: Pain  Assessment Pain Assessment: No/denies pain Pain Score: 0-No pain ADL: ADL Grooming: Moderate assistance Where Assessed-Grooming: Sitting at sink Upper Body Bathing: Moderate assistance Where Assessed-Upper Body Bathing: Sitting at sink Lower Body Bathing: Moderate assistance Where Assessed-Lower Body Bathing: Sitting at sink Upper Body Dressing: Maximal assistance Where Assessed-Upper Body Dressing: Sitting at sink Lower Body Dressing: Dependent Where Assessed-Lower Body Dressing: Sitting at sink Toileting: Not assessed (incontinent) Toilet Transfer: Not assessed Tub/Shower Transfer: Not assessed Film/video editor: Not assessed ADL Comments: pt +2 squat pivot transfer, +2 sit to stand to complete LB bathing and dressing with pt initiating more (approx 40%) this date  See FIM for current functional status  Therapy/Group: Individual Therapy  [name] 12/24/2011, 12:57 PM

## 2011-12-24 NOTE — Plan of Care (Signed)
Problem: RH Car Transfers Goal: LTG Patient will perform car transfers with assist (PT) LTG: Patient will perform car transfers with assistance (PT).  Outcome: Not Applicable Date Met:  12/24/11 D/C goal due to d/c to SNF setting. 12/24/11 RH

## 2011-12-24 NOTE — Progress Notes (Signed)
Occupational Therapy Discharge Summary  Patient Details  Name: Darrell [name] MRN: [id number] Date of Birth: 04-06-1946  Today's Date: 12/24/2011 Time: 2440-1027 Time Calculation (min): 60 min  Patient has met 1 of 9 long term goals due to improved activity tolerance, improved balance and improved attention.  Pt has demonstrated tremendous progress in attention, alertness, and initiation which allowed him to begin to improve in functional self-care tasks.  Pt requires setup and min verbal cues for initiation of bathing, occasionally requiring therapist to place washcloth on next body part to wash or even bring his unaffected arm to washcloth.  Pt with increased initiation in sit to stand and bringing body forward to complete LB bathing and dressing tasks.  Pt participates in each step, but requires increased time for initiating and motor planning.  Educated pt's wife and daughter on safety precautions and decreased sensation, awareness, and apraxia; both verbalize understanding.  Reasons goals not met: Pt is discharging to SNF due to increased burden of care and the increased amount of time he will require to get to a level where his family can physically care for him.  Pt was unable to progress to goal level in his shortened length of stay at this venue.  Recommendation:  Patient will benefit from ongoing skilled OT services in skilled nursing facility setting to continue to advance functional skills in the area of BADL and iADL; initiation, awareness, motor planning, attention, RUE functional use.  Equipment: No equipment provided  Reasons for discharge: discharge from hospital  Patient/family agrees with progress made and goals achieved: Yes  OT Discharge Precautions/Restrictions  Precautions Precautions: Fall Precaution Comments: motor apraxia, right hemiplegia, delayed initiation, right LE flexor withdrawal General   Vital Signs   Pain Pain Assessment Pain Assessment:  No/denies pain Pain Score: 0-No pain ADL ADL Grooming: Moderate assistance Where Assessed-Grooming: Sitting at sink Upper Body Bathing: Moderate assistance Where Assessed-Upper Body Bathing: Sitting at sink Lower Body Bathing: Moderate assistance Where Assessed-Lower Body Bathing: Sitting at sink Upper Body Dressing: Maximal assistance Where Assessed-Upper Body Dressing: Sitting at sink Lower Body Dressing: Dependent Where Assessed-Lower Body Dressing: Sitting at sink Toileting: Not assessed (incontinent) Toilet Transfer: Not assessed Tub/Shower Transfer: Not assessed Film/video editor: Not assessed ADL Comments: pt +2 squat pivot transfer, +2 sit to stand to complete LB bathing and dressing with pt initiating more (approx 40%) this date Vision/Perception  Vision - History Baseline Vision: Wears glasses all the time Patient Visual Report: Blurring of vision Vision - Assessment Additional Comments: left gaze preference, demonstrating improved midline orientation with min cues Perception Perception: Impaired Inattention/Neglect: Other (comment) (improved attention to right side of body) Praxis Praxis: Impaired Praxis Impairment Details: Motor planning;Perseveration  Cognition   Sensation Sensation Light Touch: Impaired by gross assessment Light Touch Impaired Details: Impaired RUE;Impaired RLE Proprioception: Impaired by gross assessment Proprioception Impaired Details: Impaired RUE;Impaired RLE Additional Comments: sensory deficits improving with vague feeling in RUE, right inattention persistent but improving with occasional spontaneous attention to RUE in functional tasks Coordination Gross Motor Movements are Fluid and Coordinated: No Fine Motor Movements are Fluid and Coordinated: No Coordination and Movement Description: decreased grading and accuarcy of bilat UEs and LEs Motor    Mobility     Trunk/Postural Assessment     Balance   Extremity/Trunk  Assessment RUE Assessment RUE Assessment: Exceptions to [facility] RUE Strength RUE Overall Strength Comments: RUE flaccid  LUE Assessment LUE Assessment: Within Functional Limits  See FIM for current functional status  Vonita Moss,  Maralyn Sago 12/24/2011, 12:57 PM

## 2011-12-24 NOTE — Progress Notes (Signed)
Speech Pathology: Dysphagia Treatment Note  1130-1200 Individual session   Patient was observed with : Mechanical Soft / Dys.3 and Thin liquids.  Patient was noted to have s/s of aspiration : No  Patient required: moderate assist cues to consistently follow precautions/strategies  Clinical Impression: SLP facilitated session with moderate assist semantic cues to utilize double swallows, and coughs after sips as airway protection strategies; minimal assist sematic cues to problem solve and sequence with self feeding task.   Recommendations:  Patient discharge on current diet and continue to receive follow up SNF therapy.  Pain:   none Intervention Required:   No  Goals: Progressing  Fae Pippin, M.A., CCC-SLP [phone]

## 2011-12-24 NOTE — Plan of Care (Signed)
Problem: RH BOWEL ELIMINATION Goal: RH STG MANAGE BOWEL WITH ASSISTANCE STG Manage Bowel with Max Assistance  Outcome: Adequate for Discharge Manage bowels with briefs-patient being discharged to skilled nursing facility.  Problem: RH SAFETY Goal: RH STG ADHERE TO SAFETY PRECAUTIONS W/ASSISTANCE/DEVICE STG Adhere to Safety Precautions With min assist of caregiver/staff  Outcome: Adequate for Discharge Patient being discharged to skilled nursing facility-caregivers will be able to reinforce safety precautions.  Problem: RH KNOWLEDGE DEFICIT Goal: RH STG INCREASE KNOWLEDGE OF DIABETES Verbalizes importance of medication and diet to control diabetes Outcome: Not Progressing Patient unable to comprehend diabetes education. Goal: RH STG INCREASE KNOWLEDGE OF HYPERTENSION Verbalizes importance of medication and diet to control hypertension  Outcome: Not Progressing Patient unable to comprehend hypertension education.

## 2011-12-25 DIAGNOSIS — I6529 Occlusion and stenosis of unspecified carotid artery: Secondary | ICD-10-CM | POA: Diagnosis not present

## 2011-12-25 DIAGNOSIS — I519 Heart disease, unspecified: Secondary | ICD-10-CM | POA: Diagnosis not present

## 2011-12-25 DIAGNOSIS — I6990 Unspecified sequelae of unspecified cerebrovascular disease: Secondary | ICD-10-CM | POA: Diagnosis not present

## 2011-12-25 DIAGNOSIS — E119 Type 2 diabetes mellitus without complications: Secondary | ICD-10-CM | POA: Diagnosis not present

## 2011-12-25 DIAGNOSIS — J189 Pneumonia, unspecified organism: Secondary | ICD-10-CM | POA: Diagnosis not present

## 2011-12-25 DIAGNOSIS — K5900 Constipation, unspecified: Secondary | ICD-10-CM | POA: Diagnosis not present

## 2011-12-25 DIAGNOSIS — I10 Essential (primary) hypertension: Secondary | ICD-10-CM | POA: Diagnosis not present

## 2011-12-25 DIAGNOSIS — F329 Major depressive disorder, single episode, unspecified: Secondary | ICD-10-CM | POA: Diagnosis not present

## 2011-12-25 DIAGNOSIS — Z5189 Encounter for other specified aftercare: Secondary | ICD-10-CM | POA: Diagnosis not present

## 2011-12-25 DIAGNOSIS — G4730 Sleep apnea, unspecified: Secondary | ICD-10-CM | POA: Diagnosis not present

## 2011-12-25 DIAGNOSIS — G4700 Insomnia, unspecified: Secondary | ICD-10-CM | POA: Diagnosis not present

## 2011-12-25 NOTE — Plan of Care (Signed)
Problem: RH Wheelchair Mobility Goal: LTG Patient will propel w/c in controlled environment (PT) LTG: Patient will propel wheelchair in controlled environment, # of feet with assist (PT)  Outcome: Adequate for Discharge Patient d/c SNF for continued therapies 12/24/11.

## 2011-12-25 NOTE — Progress Notes (Signed)
Physical Therapy Discharge Summary  Patient Details  Name: Darrell [name] MRN: [id number] Date of Birth: [birth year]  Today's Date: 12/25/2011    Patient has met 5 of 6 long term goals due to improved activity tolerance, improved balance, improved postural control, increased strength, functional use of  right upper extremity and right lower extremity, improved attention, improved awareness and improved coordination.  Due to high level of care patient currently requires, patient and family decided for d/c to SNF setting for further therapies prior to d/c home.  Reasons goals not met: requires + 2 for standing balance at this time  Recommendation:  Patient will benefit from ongoing skilled PT services in skilled nursing facility setting to continue to advance safe functional mobility, address ongoing impairments in initiation, motor apraxia, balance, attention, awareness, and strength for maximum functional gains, decreased burden of care, and minimize fall risk.  Equipment: defer to next venue of care  Reasons for discharge: discharge from hospital  Patient/family agrees with progress made and goals achieved: Yes  PT Discharge   Sensation Sensation Light Touch: Impaired by gross assessment Light Touch Impaired Details: Impaired RLE Stereognosis: Not tested Hot/Cold: Not tested Proprioception: Impaired by gross assessment Proprioception Impaired Details: Impaired RLE Coordination Gross Motor Movements are Fluid and Coordinated: No Fine Motor Movements are Fluid and Coordinated: No Coordination and Movement Description: decreased grading and accuracy bilat UEs, LEs and trunk Heel Shin Test: unable to assess due to inadequate right LE AROM and motor apraxia Motor  Motor Motor: Hemiplegia;Abnormal postural alignment and control;Motor apraxia;Abnormal tone Motor - Discharge Observations: increased right LE tone with multi-beat clonus at right ankle. Initiation time improved, however  still delayed.  Mobility Bed Mobility Rolling Right: 4: Min assist Rolling Left: 4: Min assist Right Sidelying to Sit: 3: Mod assist Supine to Sit: 3: Mod assist Sitting - Scoot to Edge of Bed: 3: Mod assist Sit to Supine: 2: Max assist Transfers Lateral/Scoot Transfers: 3: Mod assist;With slide board Locomotion  Ambulation Ambulation: Yes Ambulation/Gait Assistance: 1: +2 Total assist Ambulation Distance (Feet): 15 Feet Assistive device: Other (Comment) (Sara Plus with walking sling) Stairs / Additional Locomotion Stairs: No (unasfe to attempt at this time) Naval architect Assistance: 5: Supervision Wheelchair Propulsion: Left upper extremity;Left lower extremity Wheelchair Parts Management: Needs assistance Distance: 40  Trunk/Postural Assessment  Cervical Assessment Cervical Assessment: Within Functional Limits Thoracic Assessment Thoracic Assessment: Within Functional Limits Lumbar Assessment Lumbar Assessment: Within Functional Limits Postural Control Postural Control: Deficits on evaluation Protective Responses: delayed  Balance Static Sitting Balance Static Sitting - Balance Support: No upper extremity supported;Feet supported Static Sitting - Level of Assistance: 5: Stand by assistance Dynamic Sitting Balance Dynamic Sitting - Balance Support: Left upper extremity supported;Feet supported Dynamic Sitting - Level of Assistance: 5: Stand by assistance Static Standing Balance Static Standing - Balance Support: Bilateral upper extremity supported Static Standing - Level of Assistance: 1: +2 Total assist Extremity Assessment      RLE Assessment RLE Assessment: Exceptions to WFL (PROM WNL, strength grossly 3/5) RLE Strength RLE Overall Strength Comments: Patient abl eto spontaneuously move right LE in supine, sitting, and standing, however difficult to initiate specific task due to motor apraxia and delayed initiation LLE Assessment LLE Assessment:  Within Functional Limits (AROM WNL, strength grossly 4/5.)  See FIM for current functional status  Romeo Rabon 12/25/2011, 11:02 AM

## 2011-12-25 NOTE — Plan of Care (Signed)
Problem: RH Balance Goal: LTG Patient will maintain dynamic standing balance (PT) LTG: Patient will maintain dynamic standing balance with assistance during mobility activities (PT)  Outcome: Adequate for Discharge Patient d/c SNF for continued therapies 12/24/11.

## 2011-12-28 DIAGNOSIS — E119 Type 2 diabetes mellitus without complications: Secondary | ICD-10-CM | POA: Diagnosis not present

## 2011-12-28 DIAGNOSIS — I10 Essential (primary) hypertension: Secondary | ICD-10-CM | POA: Diagnosis not present

## 2011-12-28 DIAGNOSIS — I6990 Unspecified sequelae of unspecified cerebrovascular disease: Secondary | ICD-10-CM | POA: Diagnosis not present

## 2011-12-28 DIAGNOSIS — K5900 Constipation, unspecified: Secondary | ICD-10-CM | POA: Diagnosis not present

## 2011-12-28 DIAGNOSIS — F329 Major depressive disorder, single episode, unspecified: Secondary | ICD-10-CM | POA: Diagnosis not present

## 2011-12-30 DIAGNOSIS — I10 Essential (primary) hypertension: Secondary | ICD-10-CM | POA: Diagnosis not present

## 2011-12-30 DIAGNOSIS — I6990 Unspecified sequelae of unspecified cerebrovascular disease: Secondary | ICD-10-CM | POA: Diagnosis not present

## 2011-12-30 DIAGNOSIS — E119 Type 2 diabetes mellitus without complications: Secondary | ICD-10-CM | POA: Diagnosis not present

## 2011-12-30 DIAGNOSIS — I6529 Occlusion and stenosis of unspecified carotid artery: Secondary | ICD-10-CM | POA: Diagnosis not present

## 2012-01-05 ENCOUNTER — Encounter: Payer: Medicare Other | Admitting: Vascular Surgery

## 2012-01-26 ENCOUNTER — Encounter: Payer: Self-pay | Admitting: Vascular Surgery

## 2012-01-26 ENCOUNTER — Ambulatory Visit (INDEPENDENT_AMBULATORY_CARE_PROVIDER_SITE_OTHER): Payer: Medicare Other | Admitting: *Deleted

## 2012-01-26 ENCOUNTER — Ambulatory Visit (INDEPENDENT_AMBULATORY_CARE_PROVIDER_SITE_OTHER): Payer: Medicare Other | Admitting: Vascular Surgery

## 2012-01-26 ENCOUNTER — Ambulatory Visit: Payer: Medicare Other | Admitting: Vascular Surgery

## 2012-01-26 VITALS — BP 137/78 | HR 96 | Resp 16 | Ht 72.0 in | Wt 213.9 lb

## 2012-01-26 DIAGNOSIS — Z48812 Encounter for surgical aftercare following surgery on the circulatory system: Secondary | ICD-10-CM

## 2012-01-26 DIAGNOSIS — I6529 Occlusion and stenosis of unspecified carotid artery: Secondary | ICD-10-CM

## 2012-01-26 DIAGNOSIS — I63239 Cerebral infarction due to unspecified occlusion or stenosis of unspecified carotid arteries: Secondary | ICD-10-CM

## 2012-01-26 NOTE — Progress Notes (Signed)
Subjective:     Patient ID: Darrell [name], male   DOB: [birth year], 66 y.o.   MRN: [id number]  HPI this 66 year old male patient returns for initial followup regarding left carotid endarterectomy performed on February 13 for an extensive carotid stenosis. Initially he was neurologically intact in the PACU but then developed right-sided weakness and was urgently return to the OR and was found to have complete thrombosis of the endarterectomy site. This was treated with replacement using a saphenous vein graft from left leg which has remained patent. Patient required an inordinately large dose of heparin at the time of return to the OR. He was thought to possibly have a coagulopathy and hematology consultation was obtained. This has not been completed. Patient has been at [facility] receiving physical therapy and is getting significant improvement now able to ambulate with help in regaining function of his right upper extremity as well as having clear and fluent speech.    Review of Systems     Objective:   Physical ExamBP 137/78  Pulse 96  Resp 16  Ht 6' (1.829 m)  Wt 213 lb 14.4 oz (97.024 kg)  BMI 29.01 kg/m2  SpO2 96%  General well-developed well-nourished male in no apparent distress alert and oriented x3 Neurologic exam reveals 4/5 strength in the right lower extremity. 2-3/5 strength in the right upper extremity and ability to elevate arm against gravity to 90. Very minimal facial asymmetry. Speech clear and fluent.  Left neck incision healing nicely. 3+ carotid pulse with no bruit audible.   Data ordered a carotid duplex exam on the left. This revealed the vein graft to be widely patent. Because of relatively early postoperative period clarity was not as good as usual.     Assessment:     Status post left carotid endarterectomy with postoperative thrombosis and replacement of carotid artery with saphenous vein graft left leg-suffered embolic stroke left hemisphere-making good  recovery    Plan:     Continue rehabilitation at [facility] Return to see me in 6 months with carotid duplex exam at that time Family we'll arrange continued hematology evaluation at Dr. Precious Reel office post patient's return to home

## 2012-01-28 NOTE — Progress Notes (Signed)
Addended by: Lorin Mercy K on: 01/28/2012 11:01 AM   Modules accepted: Orders

## 2012-02-01 NOTE — Procedures (Unsigned)
CAROTID DUPLEX EXAM  INDICATION:  Follow up left carotid graft, 12/09/11.  HISTORY: Diabetes:  Yes. Cardiac:  No. Hypertension:  Yes. Smoking:  Previous. Previous Surgery:  Left carotid graft. CV History:  CVA 12/09/11. Amaurosis Fugax No, Paresthesias No, Hemiparesis No.                                      RIGHT             LEFT Brachial systolic pressure: Brachial Doppler waveforms: Vertebral direction of flow:                          WNL DUPLEX VELOCITIES (cm/sec) CCA peak systolic                                     45 ECA peak systolic                                     NV ICA peak systolic                                     36 ICA end diastolic                                     11 PLAQUE MORPHOLOGY: PLAQUE AMOUNT:                                        None PLAQUE LOCATION:  IMPRESSION: 1. Widely patent left carotid graft with no evidence of hyperplasia or     restenosis throughout the graft or at the anastomoses. 2. Proximal anastomosis is difficult to visualize; however, some     irregularity is observed.  Question valve leaflets versus normal     post-surgical changes.  ___________________________________________ Quita Skye Hart Rochester, M.D.  LT/MEDQ  D:  01/27/2012  T:  01/27/2012  Job:  [id number]

## 2012-02-02 ENCOUNTER — Encounter: Payer: Self-pay | Admitting: Physical Medicine & Rehabilitation

## 2012-02-02 ENCOUNTER — Ambulatory Visit (HOSPITAL_BASED_OUTPATIENT_CLINIC_OR_DEPARTMENT_OTHER): Payer: Medicare Other | Admitting: Physical Medicine & Rehabilitation

## 2012-02-02 ENCOUNTER — Encounter: Payer: Medicare Other | Attending: Physical Medicine & Rehabilitation

## 2012-02-02 VITALS — BP 131/87 | HR 82 | Resp 16 | Ht 71.0 in | Wt 210.0 lb

## 2012-02-02 DIAGNOSIS — I69959 Hemiplegia and hemiparesis following unspecified cerebrovascular disease affecting unspecified side: Secondary | ICD-10-CM | POA: Insufficient documentation

## 2012-02-02 DIAGNOSIS — G8110 Spastic hemiplegia affecting unspecified side: Secondary | ICD-10-CM

## 2012-02-02 DIAGNOSIS — G8111 Spastic hemiplegia affecting right dominant side: Secondary | ICD-10-CM | POA: Insufficient documentation

## 2012-02-02 NOTE — Patient Instructions (Signed)
Return for Botox injection

## 2012-02-02 NOTE — Progress Notes (Signed)
Subjective:    Patient ID: Darrell [name], male    DOB: 02/05/1946, 66 y.o.   MRN: [id number]  HPI HISTORY OF PRESENT ILLNESS: [name] is a 66 year old male  with history of diabetes mellitus, hypertension, severe left carotid  artery stenosis admitted December 09, 2011 for left CA by Dr. Hart Rochester, in  recovery. The patient with decreased level of consciousness and  inability to move right side. He was taken back to OR emergently for  exploration of left CA with thrombectomy of endarterectomized surface And distal ICA. Neurology and Heme/Onc was consulted for input. 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 No significant stenosis. Hypercoag  studies were ordered and the patient was noted to have positive lupus  anticoagulant, but not reliable due to proximity of treatment with  heparin. The patient is on aspirin per Neurology recommendations. MBS  was done on December 11, 2011 and the patient was started on D1 diet  nectar liquids.   patient was discharged to [facility] where he is continued to receive PT,OT therapy.  no falls at the skilled nursing facility. Is able to dress and bathe himself as well as walk with supervision assistance. Pain Inventory Average Pain 0 Pain Right Now 0 My pain is sharp  In the last 24 hours, has pain interfered with the following? General activity 0 Relation with others 0 Enjoyment of life 0 What TIME of day is your pain at its worst? N/A Sleep (in general) Good  Pain is worse with: bending and some activites Pain improves with: N/A Relief from Meds: 0  Mobility walk with assistance use a cane use a wheelchair  Function not employed: date last employed 2013 I need assistance with the following:  dressing, bathing, meal prep, household duties and shopping  Neuro/Psych trouble walking confusion  Prior Studies Any changes since last visit?   no  Physicians involved in your care Any changes since last visit?  no  Review of Systems  Constitutional: Negative.   HENT: Negative.   Eyes: Negative.   Respiratory: Negative.   Cardiovascular: Negative.   Gastrointestinal: Negative.   Genitourinary: Negative.   Musculoskeletal: Negative.   Skin: Negative.   Neurological: Negative.   Hematological: Negative.   Psychiatric/Behavioral: Negative.        Objective:   Physical Exam  Constitutional: He appears well-developed and well-nourished.  HENT:  Head: Normocephalic and atraumatic.  Neurological: He is alert. He exhibits abnormal muscle tone. Coordination and gait ( ) abnormal.  Reflex Scores:      Tricep reflexes are 3+ on the right side.      Bicep reflexes are 3+ on the right side and 2+ on the left side.      Brachioradialis reflexes are 3+ on the right side and 2+ on the left side.      Patellar reflexes are 3+ on the right side and 2+ on the left side.      Achilles reflexes are 3+ on the right side and 2+ on the left side.       Oriented to person ,[facility] but not time.   right finger flexor and wrist flexor tone is 3 on the Ashworth scale  ambulation shows toe drag on the right side.    Psychiatric: He has a normal mood and affect.          Assessment & Plan:   1. 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 He's had physical therapy and occupational therapy however, continues to interfere with his functional use of the upper extremity and is also causing pain in the forearm area. Because of his cognitive deficits I do not recommend any increase in  Oral spasticity medications. We'll set him up for Botox injection. This will be done in 2-3 weeks. At that time we can address therapy once he leaves the skilled nursing facility

## 2012-02-03 ENCOUNTER — Encounter: Payer: Self-pay | Admitting: Physical Medicine & Rehabilitation

## 2012-02-17 DIAGNOSIS — K5900 Constipation, unspecified: Secondary | ICD-10-CM | POA: Diagnosis not present

## 2012-02-17 DIAGNOSIS — F329 Major depressive disorder, single episode, unspecified: Secondary | ICD-10-CM | POA: Diagnosis not present

## 2012-02-17 DIAGNOSIS — I10 Essential (primary) hypertension: Secondary | ICD-10-CM | POA: Diagnosis not present

## 2012-02-17 DIAGNOSIS — M25539 Pain in unspecified wrist: Secondary | ICD-10-CM | POA: Diagnosis not present

## 2012-02-17 DIAGNOSIS — I6990 Unspecified sequelae of unspecified cerebrovascular disease: Secondary | ICD-10-CM | POA: Diagnosis not present

## 2012-02-17 DIAGNOSIS — E119 Type 2 diabetes mellitus without complications: Secondary | ICD-10-CM | POA: Diagnosis not present

## 2012-02-24 DIAGNOSIS — I6990 Unspecified sequelae of unspecified cerebrovascular disease: Secondary | ICD-10-CM | POA: Diagnosis not present

## 2012-02-24 DIAGNOSIS — G4700 Insomnia, unspecified: Secondary | ICD-10-CM | POA: Diagnosis not present

## 2012-02-24 DIAGNOSIS — K5900 Constipation, unspecified: Secondary | ICD-10-CM | POA: Diagnosis not present

## 2012-02-24 DIAGNOSIS — Z5189 Encounter for other specified aftercare: Secondary | ICD-10-CM | POA: Diagnosis not present

## 2012-02-24 DIAGNOSIS — E119 Type 2 diabetes mellitus without complications: Secondary | ICD-10-CM | POA: Diagnosis not present

## 2012-02-24 DIAGNOSIS — F329 Major depressive disorder, single episode, unspecified: Secondary | ICD-10-CM | POA: Diagnosis not present

## 2012-02-24 DIAGNOSIS — I519 Heart disease, unspecified: Secondary | ICD-10-CM | POA: Diagnosis not present

## 2012-02-24 DIAGNOSIS — G8110 Spastic hemiplegia affecting unspecified side: Secondary | ICD-10-CM | POA: Diagnosis not present

## 2012-02-24 DIAGNOSIS — I10 Essential (primary) hypertension: Secondary | ICD-10-CM | POA: Diagnosis not present

## 2012-02-24 DIAGNOSIS — G4730 Sleep apnea, unspecified: Secondary | ICD-10-CM | POA: Diagnosis not present

## 2012-02-26 ENCOUNTER — Ambulatory Visit (HOSPITAL_BASED_OUTPATIENT_CLINIC_OR_DEPARTMENT_OTHER): Payer: Medicare Other | Admitting: Physical Medicine & Rehabilitation

## 2012-02-26 ENCOUNTER — Encounter: Payer: Self-pay | Admitting: Physical Medicine & Rehabilitation

## 2012-02-26 ENCOUNTER — Encounter: Payer: Medicare Other | Attending: Physical Medicine & Rehabilitation

## 2012-02-26 DIAGNOSIS — G8110 Spastic hemiplegia affecting unspecified side: Secondary | ICD-10-CM | POA: Diagnosis not present

## 2012-02-26 DIAGNOSIS — I69959 Hemiplegia and hemiparesis following unspecified cerebrovascular disease affecting unspecified side: Secondary | ICD-10-CM | POA: Insufficient documentation

## 2012-02-26 DIAGNOSIS — G8111 Spastic hemiplegia affecting right dominant side: Secondary | ICD-10-CM

## 2012-02-26 NOTE — Progress Notes (Signed)
Botox Injection for spasticity using needle EMG guidance  Dilution: 50 Units/ml Indication: Severe spasticity which interferes with ADL,mobility and/or  hygiene and is unresponsive to medication management and other conservative care Informed consent was obtained after describing risks and benefits of the procedure with the patient. This includes bleeding, bruising, infection, excessive weakness, or medication side effects. A REMS form is on file and signed. Needle: 26g 2 in needle electrode Number of units per muscle Pectoralis0 Biceps0 FCR50 FCU25 FDS50 FDP50 FPL0 PT25 Gastrosoleus0 Hamstrings0 All injections were done after obtaining appropriate EMG activity and after negative drawback for blood. The patient tolerated the procedure well. Post procedure instructions were given. A followup appointment was made.

## 2012-02-26 NOTE — Patient Instructions (Signed)
Botox Cosmetic Injections Care After  These instructions give you information on caring for yourself after your procedure. Your doctor may also give you more specific instructions. Call your doctor if you have any problems or questions after your procedure. HOME CARE  Do not lie down for 4 hours after getting the shots.   Do not rub the muscles where the shots were given.   Exercise the muscles where the shots were given. Do this every 15 minutes for 1 hour or as told by your doctor.   You may put ice on the area where the shots were given.   Put ice in a plastic bag.   Place a towel between your skin and the bag.  GET HELP RIGHT AWAY IF:  You are seeing double.   You have trouble swallowing.   You feel weak in other parts of the body.   Bright lights bother your eyes more than usual.  MAKE SURE YOU:  Understand these instructions.   Will watch your condition.   Will get help right away if you are not doing well or get worse.  Document Released: 05/14/2011 Document Revised: 10/01/2011 Document Reviewed: 05/14/2011 ExitCare Patient Information 2012 [employer]. 

## 2012-03-01 DIAGNOSIS — E119 Type 2 diabetes mellitus without complications: Secondary | ICD-10-CM | POA: Diagnosis not present

## 2012-03-01 DIAGNOSIS — F329 Major depressive disorder, single episode, unspecified: Secondary | ICD-10-CM | POA: Diagnosis not present

## 2012-03-01 DIAGNOSIS — I10 Essential (primary) hypertension: Secondary | ICD-10-CM | POA: Diagnosis not present

## 2012-03-02 ENCOUNTER — Telehealth: Payer: Self-pay | Admitting: Family Medicine

## 2012-03-02 NOTE — Telephone Encounter (Signed)
Pts wife called and said that pt had stroke in Jan 2013. Pt is in [facility] and is being discharged possibly on 03/17/12 and needs recommendation for home health care prior to this date. What about, [facility], [facility] and [facility]? Which one would Dr Tawanna Cooler recommend. Pls call.

## 2012-03-04 NOTE — Telephone Encounter (Signed)
Darrell [name] I called date voice is recommended home health care [facility]........[name]

## 2012-03-08 DIAGNOSIS — F329 Major depressive disorder, single episode, unspecified: Secondary | ICD-10-CM | POA: Diagnosis not present

## 2012-03-08 DIAGNOSIS — I6990 Unspecified sequelae of unspecified cerebrovascular disease: Secondary | ICD-10-CM | POA: Diagnosis not present

## 2012-03-08 DIAGNOSIS — I10 Essential (primary) hypertension: Secondary | ICD-10-CM | POA: Diagnosis not present

## 2012-03-08 DIAGNOSIS — E119 Type 2 diabetes mellitus without complications: Secondary | ICD-10-CM | POA: Diagnosis not present

## 2012-03-08 DIAGNOSIS — K5900 Constipation, unspecified: Secondary | ICD-10-CM | POA: Diagnosis not present

## 2012-03-18 DIAGNOSIS — I69919 Unspecified symptoms and signs involving cognitive functions following unspecified cerebrovascular disease: Secondary | ICD-10-CM | POA: Diagnosis not present

## 2012-03-18 DIAGNOSIS — I10 Essential (primary) hypertension: Secondary | ICD-10-CM | POA: Diagnosis not present

## 2012-03-18 DIAGNOSIS — E119 Type 2 diabetes mellitus without complications: Secondary | ICD-10-CM | POA: Diagnosis not present

## 2012-03-18 DIAGNOSIS — I69959 Hemiplegia and hemiparesis following unspecified cerebrovascular disease affecting unspecified side: Secondary | ICD-10-CM | POA: Diagnosis not present

## 2012-03-18 DIAGNOSIS — F329 Major depressive disorder, single episode, unspecified: Secondary | ICD-10-CM | POA: Diagnosis not present

## 2012-03-18 DIAGNOSIS — R42 Dizziness and giddiness: Secondary | ICD-10-CM | POA: Diagnosis not present

## 2012-03-22 DIAGNOSIS — I69919 Unspecified symptoms and signs involving cognitive functions following unspecified cerebrovascular disease: Secondary | ICD-10-CM | POA: Diagnosis not present

## 2012-03-22 DIAGNOSIS — I10 Essential (primary) hypertension: Secondary | ICD-10-CM | POA: Diagnosis not present

## 2012-03-22 DIAGNOSIS — I69959 Hemiplegia and hemiparesis following unspecified cerebrovascular disease affecting unspecified side: Secondary | ICD-10-CM | POA: Diagnosis not present

## 2012-03-22 DIAGNOSIS — E119 Type 2 diabetes mellitus without complications: Secondary | ICD-10-CM | POA: Diagnosis not present

## 2012-03-22 DIAGNOSIS — F329 Major depressive disorder, single episode, unspecified: Secondary | ICD-10-CM | POA: Diagnosis not present

## 2012-03-22 DIAGNOSIS — R42 Dizziness and giddiness: Secondary | ICD-10-CM | POA: Diagnosis not present

## 2012-03-23 DIAGNOSIS — I69959 Hemiplegia and hemiparesis following unspecified cerebrovascular disease affecting unspecified side: Secondary | ICD-10-CM | POA: Diagnosis not present

## 2012-03-23 DIAGNOSIS — I10 Essential (primary) hypertension: Secondary | ICD-10-CM | POA: Diagnosis not present

## 2012-03-23 DIAGNOSIS — R42 Dizziness and giddiness: Secondary | ICD-10-CM | POA: Diagnosis not present

## 2012-03-23 DIAGNOSIS — I69919 Unspecified symptoms and signs involving cognitive functions following unspecified cerebrovascular disease: Secondary | ICD-10-CM | POA: Diagnosis not present

## 2012-03-23 DIAGNOSIS — E119 Type 2 diabetes mellitus without complications: Secondary | ICD-10-CM | POA: Diagnosis not present

## 2012-03-23 DIAGNOSIS — F329 Major depressive disorder, single episode, unspecified: Secondary | ICD-10-CM | POA: Diagnosis not present

## 2012-03-24 DIAGNOSIS — R42 Dizziness and giddiness: Secondary | ICD-10-CM | POA: Diagnosis not present

## 2012-03-24 DIAGNOSIS — F329 Major depressive disorder, single episode, unspecified: Secondary | ICD-10-CM | POA: Diagnosis not present

## 2012-03-24 DIAGNOSIS — I69919 Unspecified symptoms and signs involving cognitive functions following unspecified cerebrovascular disease: Secondary | ICD-10-CM | POA: Diagnosis not present

## 2012-03-24 DIAGNOSIS — I69959 Hemiplegia and hemiparesis following unspecified cerebrovascular disease affecting unspecified side: Secondary | ICD-10-CM | POA: Diagnosis not present

## 2012-03-24 DIAGNOSIS — I10 Essential (primary) hypertension: Secondary | ICD-10-CM | POA: Diagnosis not present

## 2012-03-24 DIAGNOSIS — E119 Type 2 diabetes mellitus without complications: Secondary | ICD-10-CM | POA: Diagnosis not present

## 2012-03-25 DIAGNOSIS — I10 Essential (primary) hypertension: Secondary | ICD-10-CM | POA: Diagnosis not present

## 2012-03-25 DIAGNOSIS — E119 Type 2 diabetes mellitus without complications: Secondary | ICD-10-CM | POA: Diagnosis not present

## 2012-03-25 DIAGNOSIS — R42 Dizziness and giddiness: Secondary | ICD-10-CM | POA: Diagnosis not present

## 2012-03-25 DIAGNOSIS — I69959 Hemiplegia and hemiparesis following unspecified cerebrovascular disease affecting unspecified side: Secondary | ICD-10-CM | POA: Diagnosis not present

## 2012-03-25 DIAGNOSIS — F329 Major depressive disorder, single episode, unspecified: Secondary | ICD-10-CM | POA: Diagnosis not present

## 2012-03-25 DIAGNOSIS — I69919 Unspecified symptoms and signs involving cognitive functions following unspecified cerebrovascular disease: Secondary | ICD-10-CM | POA: Diagnosis not present

## 2012-03-27 DIAGNOSIS — I10 Essential (primary) hypertension: Secondary | ICD-10-CM | POA: Diagnosis not present

## 2012-03-27 DIAGNOSIS — I69919 Unspecified symptoms and signs involving cognitive functions following unspecified cerebrovascular disease: Secondary | ICD-10-CM | POA: Diagnosis not present

## 2012-03-27 DIAGNOSIS — R42 Dizziness and giddiness: Secondary | ICD-10-CM | POA: Diagnosis not present

## 2012-03-27 DIAGNOSIS — E119 Type 2 diabetes mellitus without complications: Secondary | ICD-10-CM | POA: Diagnosis not present

## 2012-03-27 DIAGNOSIS — F329 Major depressive disorder, single episode, unspecified: Secondary | ICD-10-CM | POA: Diagnosis not present

## 2012-03-27 DIAGNOSIS — I69959 Hemiplegia and hemiparesis following unspecified cerebrovascular disease affecting unspecified side: Secondary | ICD-10-CM | POA: Diagnosis not present

## 2012-03-28 ENCOUNTER — Other Ambulatory Visit: Payer: Self-pay | Admitting: *Deleted

## 2012-03-28 ENCOUNTER — Encounter: Payer: Medicare Other | Attending: Physical Medicine & Rehabilitation

## 2012-03-28 ENCOUNTER — Encounter: Payer: Self-pay | Admitting: Physical Medicine & Rehabilitation

## 2012-03-28 ENCOUNTER — Ambulatory Visit (HOSPITAL_BASED_OUTPATIENT_CLINIC_OR_DEPARTMENT_OTHER): Payer: Medicare Other | Admitting: Physical Medicine & Rehabilitation

## 2012-03-28 VITALS — BP 126/72 | HR 75 | Ht 71.0 in | Wt 198.0 lb

## 2012-03-28 DIAGNOSIS — G8110 Spastic hemiplegia affecting unspecified side: Secondary | ICD-10-CM | POA: Diagnosis not present

## 2012-03-28 DIAGNOSIS — M7551 Bursitis of right shoulder: Secondary | ICD-10-CM

## 2012-03-28 DIAGNOSIS — I69919 Unspecified symptoms and signs involving cognitive functions following unspecified cerebrovascular disease: Secondary | ICD-10-CM | POA: Diagnosis not present

## 2012-03-28 DIAGNOSIS — F329 Major depressive disorder, single episode, unspecified: Secondary | ICD-10-CM | POA: Diagnosis not present

## 2012-03-28 DIAGNOSIS — R42 Dizziness and giddiness: Secondary | ICD-10-CM | POA: Diagnosis not present

## 2012-03-28 DIAGNOSIS — M75100 Unspecified rotator cuff tear or rupture of unspecified shoulder, not specified as traumatic: Secondary | ICD-10-CM

## 2012-03-28 DIAGNOSIS — G8111 Spastic hemiplegia affecting right dominant side: Secondary | ICD-10-CM

## 2012-03-28 DIAGNOSIS — IMO0002 Reserved for concepts with insufficient information to code with codable children: Secondary | ICD-10-CM

## 2012-03-28 DIAGNOSIS — I69959 Hemiplegia and hemiparesis following unspecified cerebrovascular disease affecting unspecified side: Secondary | ICD-10-CM | POA: Diagnosis not present

## 2012-03-28 DIAGNOSIS — E119 Type 2 diabetes mellitus without complications: Secondary | ICD-10-CM | POA: Diagnosis not present

## 2012-03-28 DIAGNOSIS — I10 Essential (primary) hypertension: Secondary | ICD-10-CM | POA: Diagnosis not present

## 2012-03-28 MED ORDER — INSULIN SYRINGES (DISPOSABLE) U-100 1 ML MISC: 1.0000 | Freq: Every day | Status: DC

## 2012-03-28 NOTE — Patient Instructions (Signed)
Joint Injection Care After Refer to this sheet in the next few days. These instructions provide you with information on caring for yourself after you have had a joint injection. Your caregiver also may give you more specific instructions. Your treatment has been planned according to current medical practices, but problems sometimes occur. Call your caregiver if you have any problems or questions after your procedure. After any type of joint injection, it is not uncommon to experience:  Soreness, swelling, or bruising around the injection site.   Mild numbness, tingling, or weakness around the injection site caused by the numbing medicine used before or with the injection.  It also is possible to experience the following effects associated with the specific agent after injection:  Iodine-based contrast agents:   Allergic reaction (itching, hives, widespread redness, and swelling beyond the injection site).   Corticosteroids (These effects are rare.):   Allergic reaction.   Increased blood sugar levels (If you have diabetes and you notice that your blood sugar levels have increased, notify your caregiver).   Increased blood pressure levels.   Mood swings.   Hyaluronic acid in the use of viscosupplementation.   Temporary heat or redness.   Temporary rash and itching.   Increased fluid accumulation in the injected joint.  These effects all should resolve within a day after your procedure.  HOME CARE INSTRUCTIONS  Limit yourself to light activity the day of your procedure. Avoid lifting heavy objects, bending, stooping, or twisting.   Take prescription or over-the-counter pain medication as directed by your caregiver.   You may apply ice to your injection site to reduce pain and swelling the day of your procedure. Ice may be applied 3 to 4 times:   Put ice in a plastic bag.   Place a towel between your skin and the bag.   Leave the ice on for no longer than 15 to 20 minutes  each time.  SEEK IMMEDIATE MEDICAL CARE IF:   Pain and swelling get worse rather than better or extend beyond the injection site.   Numbness does not go away.   Blood or fluid continues to leak from the injection site.   You have chest pain.   You have swelling of your face or tongue.   You have trouble breathing or you become dizzy.   You develop a fever, chills, or severe tenderness at the injection site that last longer than 1 day.  MAKE SURE YOU:  Understand these instructions.   Watch your condition.   Get help right away if you are not doing well or if you get worse.  Document Released: 06/25/2011 Document Revised: 10/01/2011 Document Reviewed: 06/25/2011 ExitCare Patient Information 2012 [employer]. 

## 2012-03-28 NOTE — Progress Notes (Signed)
Subjective:    Patient ID: Darrell [name], male    DOB: 05/01/1946, 66 y.o.   MRN: [id number]  HPI  right wrist and hand are more relaxed after Botox injection. Chief complaint is right shoulder pain mainly with therapy. Now receiving home health therapy Pain Inventory Average Pain 0 Pain Right Now 0 My pain is none  In the last 24 hours, has pain interfered with the following? General activity 0 Relation with others 0 Enjoyment of life 0 What TIME of day is your pain at its worst? n/a Sleep (in general) Good  Pain is worse with: n/a Pain improves with: n/a Relief from Meds: n/a  Mobility use a cane how many minutes can you walk? 10-15 ability to climb steps?  yes do you drive?  no  Function not employed: date last employed 2013 I need assistance with the following:  bathing, meal prep, household duties and shopping  Neuro/Psych weakness  Prior Studies Any changes since last visit?  no  Physicians involved in your care Any changes since last visit?  no   Family History  Problem Relation Age of Onset  . Heart disease Father    History   Social History  . Marital Status: Married    Spouse Name: N/A    Number of Children: N/A  . Years of Education: N/A   Social History Main Topics  . Smoking status: Former Smoker -- 2.5 packs/day for 30 years    Types: Cigarettes    Quit date: 12/07/2001  . Smokeless tobacco: Never Used  . Alcohol Use: Yes     socially  . Drug Use: No  . Sexually Active: None   Other Topics Concern  . None   Social History Narrative  . None   Past Surgical History  Procedure Date  . Hernia repair   . Heel spur surgery   . Heer surgery   . Endarterectomy 12/09/2011    Procedure: ENDARTERECTOMY CAROTID;  Surgeon: [name], MD;  Location: [facility] OR;  Service: Vascular;  Laterality: Left;  Left Carotid endarterectomy with dacron patch angioplasty  . Carotid endarterectomy 12/09/11    Left   Past Medical History  Diagnosis  Date  . Diabetes mellitus   . Sleep apnea     cpap      sleep study > 4 yrs  . Hypertension     dr todd   BP 126/72  Pulse 75  Ht 5\' 11"  (1.803 m)  Wt 198 lb (89.812 kg)  BMI 27.62 kg/m2  SpO2 94%     Review of Systems  Neurological: Positive for numbness.  All other systems reviewed and are negative.       Objective:   Physical Exam   Right wrist and finger flexors Ashworth grade 0 Finger extensor contractures at the PIP MCP DIC Positive impingement sign right shoulder Motor strength is 2 minus at the right deltoid biceps triceps     Assessment & Plan:  1. Right spastic hemiplegia improved after Botox injection, continue PT OT, may need to reinject him in 3 months 2. Right subacromial bursitis post stroke shoulder painwill do subacromial bursa injection given that he is not a good candidate for nonsteroidals given his recent stroke  Right subacromial bursa injection Indication right subacromial bursitis post stroke shoulder pain Injectate 1 cc of 40 marked cc Depo-Medrol and 4 cc of 1% lidocaine Informed consent was obtained after describing risks and benefits of the procedure with the patient these include bleeding bruising  and infection he elects to proceed and has given written consent Patient placed in a seated position area marked and prepped with ink pen plus Betadine alcohol and then entered with a 25-gauge 1/2 inch needle. After negative draw back for blood the Depo-Medrol lidocaine solution was injected. Patient tolerated procedure well. Post procedure instructions given.

## 2012-03-29 DIAGNOSIS — F329 Major depressive disorder, single episode, unspecified: Secondary | ICD-10-CM | POA: Diagnosis not present

## 2012-03-29 DIAGNOSIS — I69959 Hemiplegia and hemiparesis following unspecified cerebrovascular disease affecting unspecified side: Secondary | ICD-10-CM | POA: Diagnosis not present

## 2012-03-29 DIAGNOSIS — I69919 Unspecified symptoms and signs involving cognitive functions following unspecified cerebrovascular disease: Secondary | ICD-10-CM | POA: Diagnosis not present

## 2012-03-29 DIAGNOSIS — R42 Dizziness and giddiness: Secondary | ICD-10-CM | POA: Diagnosis not present

## 2012-03-29 DIAGNOSIS — I63239 Cerebral infarction due to unspecified occlusion or stenosis of unspecified carotid arteries: Secondary | ICD-10-CM | POA: Diagnosis not present

## 2012-03-29 DIAGNOSIS — E119 Type 2 diabetes mellitus without complications: Secondary | ICD-10-CM | POA: Diagnosis not present

## 2012-03-29 DIAGNOSIS — I10 Essential (primary) hypertension: Secondary | ICD-10-CM | POA: Diagnosis not present

## 2012-03-29 DIAGNOSIS — I6350 Cerebral infarction due to unspecified occlusion or stenosis of unspecified cerebral artery: Secondary | ICD-10-CM | POA: Diagnosis not present

## 2012-03-30 DIAGNOSIS — I69919 Unspecified symptoms and signs involving cognitive functions following unspecified cerebrovascular disease: Secondary | ICD-10-CM | POA: Diagnosis not present

## 2012-03-30 DIAGNOSIS — I69959 Hemiplegia and hemiparesis following unspecified cerebrovascular disease affecting unspecified side: Secondary | ICD-10-CM | POA: Diagnosis not present

## 2012-03-30 DIAGNOSIS — I10 Essential (primary) hypertension: Secondary | ICD-10-CM | POA: Diagnosis not present

## 2012-03-30 DIAGNOSIS — R42 Dizziness and giddiness: Secondary | ICD-10-CM | POA: Diagnosis not present

## 2012-03-30 DIAGNOSIS — F329 Major depressive disorder, single episode, unspecified: Secondary | ICD-10-CM | POA: Diagnosis not present

## 2012-03-30 DIAGNOSIS — E119 Type 2 diabetes mellitus without complications: Secondary | ICD-10-CM | POA: Diagnosis not present

## 2012-03-31 DIAGNOSIS — I69959 Hemiplegia and hemiparesis following unspecified cerebrovascular disease affecting unspecified side: Secondary | ICD-10-CM | POA: Diagnosis not present

## 2012-03-31 DIAGNOSIS — R42 Dizziness and giddiness: Secondary | ICD-10-CM | POA: Diagnosis not present

## 2012-03-31 DIAGNOSIS — E119 Type 2 diabetes mellitus without complications: Secondary | ICD-10-CM | POA: Diagnosis not present

## 2012-03-31 DIAGNOSIS — F329 Major depressive disorder, single episode, unspecified: Secondary | ICD-10-CM | POA: Diagnosis not present

## 2012-03-31 DIAGNOSIS — I69919 Unspecified symptoms and signs involving cognitive functions following unspecified cerebrovascular disease: Secondary | ICD-10-CM | POA: Diagnosis not present

## 2012-03-31 DIAGNOSIS — I10 Essential (primary) hypertension: Secondary | ICD-10-CM | POA: Diagnosis not present

## 2012-04-01 DIAGNOSIS — E119 Type 2 diabetes mellitus without complications: Secondary | ICD-10-CM | POA: Diagnosis not present

## 2012-04-01 DIAGNOSIS — F329 Major depressive disorder, single episode, unspecified: Secondary | ICD-10-CM | POA: Diagnosis not present

## 2012-04-01 DIAGNOSIS — I10 Essential (primary) hypertension: Secondary | ICD-10-CM | POA: Diagnosis not present

## 2012-04-01 DIAGNOSIS — I69959 Hemiplegia and hemiparesis following unspecified cerebrovascular disease affecting unspecified side: Secondary | ICD-10-CM | POA: Diagnosis not present

## 2012-04-01 DIAGNOSIS — I69919 Unspecified symptoms and signs involving cognitive functions following unspecified cerebrovascular disease: Secondary | ICD-10-CM | POA: Diagnosis not present

## 2012-04-01 DIAGNOSIS — R42 Dizziness and giddiness: Secondary | ICD-10-CM | POA: Diagnosis not present

## 2012-04-02 DIAGNOSIS — R42 Dizziness and giddiness: Secondary | ICD-10-CM | POA: Diagnosis not present

## 2012-04-02 DIAGNOSIS — I69959 Hemiplegia and hemiparesis following unspecified cerebrovascular disease affecting unspecified side: Secondary | ICD-10-CM | POA: Diagnosis not present

## 2012-04-02 DIAGNOSIS — I69919 Unspecified symptoms and signs involving cognitive functions following unspecified cerebrovascular disease: Secondary | ICD-10-CM | POA: Diagnosis not present

## 2012-04-02 DIAGNOSIS — I10 Essential (primary) hypertension: Secondary | ICD-10-CM | POA: Diagnosis not present

## 2012-04-02 DIAGNOSIS — F329 Major depressive disorder, single episode, unspecified: Secondary | ICD-10-CM | POA: Diagnosis not present

## 2012-04-02 DIAGNOSIS — E119 Type 2 diabetes mellitus without complications: Secondary | ICD-10-CM | POA: Diagnosis not present

## 2012-04-03 DIAGNOSIS — I10 Essential (primary) hypertension: Secondary | ICD-10-CM | POA: Diagnosis not present

## 2012-04-03 DIAGNOSIS — R42 Dizziness and giddiness: Secondary | ICD-10-CM | POA: Diagnosis not present

## 2012-04-03 DIAGNOSIS — F329 Major depressive disorder, single episode, unspecified: Secondary | ICD-10-CM | POA: Diagnosis not present

## 2012-04-03 DIAGNOSIS — I69919 Unspecified symptoms and signs involving cognitive functions following unspecified cerebrovascular disease: Secondary | ICD-10-CM | POA: Diagnosis not present

## 2012-04-03 DIAGNOSIS — E119 Type 2 diabetes mellitus without complications: Secondary | ICD-10-CM | POA: Diagnosis not present

## 2012-04-03 DIAGNOSIS — I69959 Hemiplegia and hemiparesis following unspecified cerebrovascular disease affecting unspecified side: Secondary | ICD-10-CM | POA: Diagnosis not present

## 2012-04-04 ENCOUNTER — Ambulatory Visit: Payer: Medicare Other | Admitting: Physical Medicine & Rehabilitation

## 2012-04-04 DIAGNOSIS — E119 Type 2 diabetes mellitus without complications: Secondary | ICD-10-CM | POA: Diagnosis not present

## 2012-04-04 DIAGNOSIS — F329 Major depressive disorder, single episode, unspecified: Secondary | ICD-10-CM | POA: Diagnosis not present

## 2012-04-04 DIAGNOSIS — I69919 Unspecified symptoms and signs involving cognitive functions following unspecified cerebrovascular disease: Secondary | ICD-10-CM | POA: Diagnosis not present

## 2012-04-04 DIAGNOSIS — I10 Essential (primary) hypertension: Secondary | ICD-10-CM | POA: Diagnosis not present

## 2012-04-04 DIAGNOSIS — R42 Dizziness and giddiness: Secondary | ICD-10-CM | POA: Diagnosis not present

## 2012-04-04 DIAGNOSIS — I69959 Hemiplegia and hemiparesis following unspecified cerebrovascular disease affecting unspecified side: Secondary | ICD-10-CM | POA: Diagnosis not present

## 2012-04-05 DIAGNOSIS — F329 Major depressive disorder, single episode, unspecified: Secondary | ICD-10-CM | POA: Diagnosis not present

## 2012-04-05 DIAGNOSIS — I69919 Unspecified symptoms and signs involving cognitive functions following unspecified cerebrovascular disease: Secondary | ICD-10-CM | POA: Diagnosis not present

## 2012-04-05 DIAGNOSIS — I10 Essential (primary) hypertension: Secondary | ICD-10-CM | POA: Diagnosis not present

## 2012-04-05 DIAGNOSIS — I69959 Hemiplegia and hemiparesis following unspecified cerebrovascular disease affecting unspecified side: Secondary | ICD-10-CM | POA: Diagnosis not present

## 2012-04-05 DIAGNOSIS — R42 Dizziness and giddiness: Secondary | ICD-10-CM | POA: Diagnosis not present

## 2012-04-05 DIAGNOSIS — E119 Type 2 diabetes mellitus without complications: Secondary | ICD-10-CM | POA: Diagnosis not present

## 2012-04-07 ENCOUNTER — Ambulatory Visit (INDEPENDENT_AMBULATORY_CARE_PROVIDER_SITE_OTHER): Payer: Medicare Other | Admitting: Family Medicine

## 2012-04-07 ENCOUNTER — Encounter: Payer: Self-pay | Admitting: Family Medicine

## 2012-04-07 VITALS — BP 102/68 | Temp 98.3°F | Wt 199.0 lb

## 2012-04-07 DIAGNOSIS — E119 Type 2 diabetes mellitus without complications: Secondary | ICD-10-CM | POA: Diagnosis not present

## 2012-04-07 DIAGNOSIS — I69919 Unspecified symptoms and signs involving cognitive functions following unspecified cerebrovascular disease: Secondary | ICD-10-CM | POA: Diagnosis not present

## 2012-04-07 DIAGNOSIS — E1142 Type 2 diabetes mellitus with diabetic polyneuropathy: Secondary | ICD-10-CM | POA: Diagnosis not present

## 2012-04-07 DIAGNOSIS — E1149 Type 2 diabetes mellitus with other diabetic neurological complication: Secondary | ICD-10-CM

## 2012-04-07 DIAGNOSIS — I10 Essential (primary) hypertension: Secondary | ICD-10-CM

## 2012-04-07 DIAGNOSIS — I69959 Hemiplegia and hemiparesis following unspecified cerebrovascular disease affecting unspecified side: Secondary | ICD-10-CM | POA: Diagnosis not present

## 2012-04-07 DIAGNOSIS — R42 Dizziness and giddiness: Secondary | ICD-10-CM | POA: Diagnosis not present

## 2012-04-07 DIAGNOSIS — F329 Major depressive disorder, single episode, unspecified: Secondary | ICD-10-CM | POA: Diagnosis not present

## 2012-04-07 LAB — LIPID PANEL
Cholesterol: 122 mg/dL (ref 0–200)
HDL: 33.6 mg/dL — ABNORMAL LOW (ref >39.00)
LDL Cholesterol: 63 mg/dL (ref 0–99)
VLDL: 25 mg/dL (ref 0.0–40.0)

## 2012-04-07 LAB — BASIC METABOLIC PANEL
BUN: 18 mg/dL (ref 6–23)
Calcium: 9.1 mg/dL (ref 8.4–10.5)
Creatinine, Ser: 0.7 mg/dL (ref 0.4–1.5)
GFR: 115.69 mL/min (ref >60.00)
Glucose, Bld: 104 mg/dL — ABNORMAL HIGH (ref 70–99)
Sodium: 141 mEq/L (ref 135–145)

## 2012-04-07 MED ORDER — LISINOPRIL 10 MG PO TABS: ORAL_TABLET | ORAL | Status: DC

## 2012-04-07 NOTE — Patient Instructions (Signed)
Increase the lisinopril to 10 mg daily in the morning  The metformin should be taken one before breakfast and 1 before his evening meal  Continue the 10 units of insulin at bedtime  Check a fasting blood sugar daily in the morning and if you see consistently that his blood sugars are lower than 80 in the morning stop the insulin completely  Followup in one month sooner if any problems

## 2012-04-07 NOTE — Progress Notes (Signed)
  Subjective:    Patient ID: Darrell [name], male    DOB: 1946-05-06, 66 y.o.   MRN: [id number]  HPI [name]  a 66 year old married male nonsmoker who comes in today for followup of multiple issues  At his last physical examination he had a carotid bruit. Workup showed an 80+ percent lesion. Dr. Hart Rochester did an endarterectomy unfortunately he had a postop occlusion that required a redo. He then went to rehabilitation and then [facility]. He's home now and doing well. He still has no function in his right hand but he does have function in his upper arm. His right leg is about 75% back to normal. His mood is good and his weight is down to 199 pounds and he can walk without crutches.  His blood pressure at home is elevated 140/90 range.  His blood sugar on Lantus 10 units daily and metformin twice a day is in the 90-120 range. No hypoglycemia  No numbness or tingling of his feet therefore we will continue to hold the amitriptyline   Review of Systems General and metabolic review of systems otherwise negative    Objective:   Physical Exam Well-developed well-nourished male in no acute distress       Assessment & Plan:  Diabetes type 1 monitor blood sugar continue oral meds and insulin  Hypertension not at goal increase lisinopril to 10 mg daily BP check daily followup in one month

## 2012-04-09 DIAGNOSIS — I69959 Hemiplegia and hemiparesis following unspecified cerebrovascular disease affecting unspecified side: Secondary | ICD-10-CM | POA: Diagnosis not present

## 2012-04-09 DIAGNOSIS — R42 Dizziness and giddiness: Secondary | ICD-10-CM | POA: Diagnosis not present

## 2012-04-09 DIAGNOSIS — E119 Type 2 diabetes mellitus without complications: Secondary | ICD-10-CM | POA: Diagnosis not present

## 2012-04-09 DIAGNOSIS — I69919 Unspecified symptoms and signs involving cognitive functions following unspecified cerebrovascular disease: Secondary | ICD-10-CM | POA: Diagnosis not present

## 2012-04-09 DIAGNOSIS — F329 Major depressive disorder, single episode, unspecified: Secondary | ICD-10-CM | POA: Diagnosis not present

## 2012-04-09 DIAGNOSIS — I10 Essential (primary) hypertension: Secondary | ICD-10-CM | POA: Diagnosis not present

## 2012-04-10 DIAGNOSIS — R42 Dizziness and giddiness: Secondary | ICD-10-CM | POA: Diagnosis not present

## 2012-04-10 DIAGNOSIS — I69959 Hemiplegia and hemiparesis following unspecified cerebrovascular disease affecting unspecified side: Secondary | ICD-10-CM | POA: Diagnosis not present

## 2012-04-10 DIAGNOSIS — I10 Essential (primary) hypertension: Secondary | ICD-10-CM | POA: Diagnosis not present

## 2012-04-10 DIAGNOSIS — F329 Major depressive disorder, single episode, unspecified: Secondary | ICD-10-CM | POA: Diagnosis not present

## 2012-04-10 DIAGNOSIS — E119 Type 2 diabetes mellitus without complications: Secondary | ICD-10-CM | POA: Diagnosis not present

## 2012-04-10 DIAGNOSIS — I69919 Unspecified symptoms and signs involving cognitive functions following unspecified cerebrovascular disease: Secondary | ICD-10-CM | POA: Diagnosis not present

## 2012-04-11 ENCOUNTER — Telehealth: Payer: Self-pay | Admitting: Family Medicine

## 2012-04-11 NOTE — Telephone Encounter (Signed)
[facility] called and said that they had lft a vm with Dr Barbette Or nurse on Thursday 04/07/12 re: gettting additional orders for nursing, htn, diabetes and stroke education for once a wk for 2 more weeks. Just calling to check on status of orders. Pls call.

## 2012-04-11 NOTE — Telephone Encounter (Signed)
Fleet Contras please call to  see if he needs his service or not

## 2012-04-11 NOTE — Telephone Encounter (Signed)
Are these orders okay to give?

## 2012-04-12 DIAGNOSIS — F329 Major depressive disorder, single episode, unspecified: Secondary | ICD-10-CM | POA: Diagnosis not present

## 2012-04-12 DIAGNOSIS — I69919 Unspecified symptoms and signs involving cognitive functions following unspecified cerebrovascular disease: Secondary | ICD-10-CM | POA: Diagnosis not present

## 2012-04-12 DIAGNOSIS — I10 Essential (primary) hypertension: Secondary | ICD-10-CM | POA: Diagnosis not present

## 2012-04-12 DIAGNOSIS — I69959 Hemiplegia and hemiparesis following unspecified cerebrovascular disease affecting unspecified side: Secondary | ICD-10-CM | POA: Diagnosis not present

## 2012-04-12 DIAGNOSIS — E119 Type 2 diabetes mellitus without complications: Secondary | ICD-10-CM | POA: Diagnosis not present

## 2012-04-12 DIAGNOSIS — R42 Dizziness and giddiness: Secondary | ICD-10-CM | POA: Diagnosis not present

## 2012-04-12 NOTE — Telephone Encounter (Signed)
Spoke with wife and she would like to continue the in home therapy until they start the outpatient therapy.  Okay to give orders?

## 2012-04-12 NOTE — Telephone Encounter (Signed)
Left message on machine for Wade at [facility].

## 2012-04-12 NOTE — Telephone Encounter (Signed)
ok 

## 2012-04-13 ENCOUNTER — Other Ambulatory Visit: Payer: Self-pay | Admitting: Family Medicine

## 2012-04-13 DIAGNOSIS — F329 Major depressive disorder, single episode, unspecified: Secondary | ICD-10-CM | POA: Diagnosis not present

## 2012-04-13 DIAGNOSIS — I10 Essential (primary) hypertension: Secondary | ICD-10-CM | POA: Diagnosis not present

## 2012-04-13 DIAGNOSIS — I69919 Unspecified symptoms and signs involving cognitive functions following unspecified cerebrovascular disease: Secondary | ICD-10-CM | POA: Diagnosis not present

## 2012-04-13 DIAGNOSIS — R42 Dizziness and giddiness: Secondary | ICD-10-CM | POA: Diagnosis not present

## 2012-04-13 DIAGNOSIS — I69959 Hemiplegia and hemiparesis following unspecified cerebrovascular disease affecting unspecified side: Secondary | ICD-10-CM | POA: Diagnosis not present

## 2012-04-13 DIAGNOSIS — E119 Type 2 diabetes mellitus without complications: Secondary | ICD-10-CM | POA: Diagnosis not present

## 2012-04-13 MED ORDER — METHYLPHENIDATE HCL 10 MG PO TABS: 10.0000 mg | ORAL_TABLET | Freq: Two times a day (BID) | ORAL | Status: DC

## 2012-04-13 NOTE — Telephone Encounter (Signed)
Requesting refill on methylphenidate (RITALIN) 10 MG tablet   pt requesting to be contacted when done

## 2012-04-13 NOTE — Telephone Encounter (Signed)
Rx ready for pick up.  Left message on machine for patient. 

## 2012-04-14 DIAGNOSIS — I69959 Hemiplegia and hemiparesis following unspecified cerebrovascular disease affecting unspecified side: Secondary | ICD-10-CM | POA: Diagnosis not present

## 2012-04-14 DIAGNOSIS — I69919 Unspecified symptoms and signs involving cognitive functions following unspecified cerebrovascular disease: Secondary | ICD-10-CM | POA: Diagnosis not present

## 2012-04-14 DIAGNOSIS — I10 Essential (primary) hypertension: Secondary | ICD-10-CM | POA: Diagnosis not present

## 2012-04-14 DIAGNOSIS — R42 Dizziness and giddiness: Secondary | ICD-10-CM | POA: Diagnosis not present

## 2012-04-14 DIAGNOSIS — E119 Type 2 diabetes mellitus without complications: Secondary | ICD-10-CM | POA: Diagnosis not present

## 2012-04-14 DIAGNOSIS — F329 Major depressive disorder, single episode, unspecified: Secondary | ICD-10-CM | POA: Diagnosis not present

## 2012-04-15 ENCOUNTER — Other Ambulatory Visit: Payer: Self-pay | Admitting: *Deleted

## 2012-04-15 DIAGNOSIS — R42 Dizziness and giddiness: Secondary | ICD-10-CM | POA: Diagnosis not present

## 2012-04-15 DIAGNOSIS — I69919 Unspecified symptoms and signs involving cognitive functions following unspecified cerebrovascular disease: Secondary | ICD-10-CM | POA: Diagnosis not present

## 2012-04-15 DIAGNOSIS — I10 Essential (primary) hypertension: Secondary | ICD-10-CM | POA: Diagnosis not present

## 2012-04-15 DIAGNOSIS — F329 Major depressive disorder, single episode, unspecified: Secondary | ICD-10-CM | POA: Diagnosis not present

## 2012-04-15 DIAGNOSIS — E119 Type 2 diabetes mellitus without complications: Secondary | ICD-10-CM | POA: Diagnosis not present

## 2012-04-15 DIAGNOSIS — I69959 Hemiplegia and hemiparesis following unspecified cerebrovascular disease affecting unspecified side: Secondary | ICD-10-CM | POA: Diagnosis not present

## 2012-04-15 MED ORDER — TIZANIDINE HCL 2 MG PO CAPS: 2.0000 mg | ORAL_CAPSULE | Freq: Three times a day (TID) | ORAL | Status: DC

## 2012-04-15 MED ORDER — INSULIN GLARGINE 100 UNIT/ML ~~LOC~~ SOLN: 25.0000 [IU] | Freq: Every day | SUBCUTANEOUS | Status: DC

## 2012-04-15 MED ORDER — METFORMIN HCL 1000 MG PO TABS: 1000.0000 mg | ORAL_TABLET | Freq: Two times a day (BID) | ORAL | Status: DC

## 2012-04-15 MED ORDER — CITALOPRAM HYDROBROMIDE 10 MG PO TABS: 10.0000 mg | ORAL_TABLET | Freq: Every day | ORAL | Status: DC

## 2012-04-18 DIAGNOSIS — F329 Major depressive disorder, single episode, unspecified: Secondary | ICD-10-CM | POA: Diagnosis not present

## 2012-04-18 DIAGNOSIS — R42 Dizziness and giddiness: Secondary | ICD-10-CM | POA: Diagnosis not present

## 2012-04-18 DIAGNOSIS — E119 Type 2 diabetes mellitus without complications: Secondary | ICD-10-CM | POA: Diagnosis not present

## 2012-04-18 DIAGNOSIS — I69959 Hemiplegia and hemiparesis following unspecified cerebrovascular disease affecting unspecified side: Secondary | ICD-10-CM | POA: Diagnosis not present

## 2012-04-18 DIAGNOSIS — I10 Essential (primary) hypertension: Secondary | ICD-10-CM | POA: Diagnosis not present

## 2012-04-18 DIAGNOSIS — I69919 Unspecified symptoms and signs involving cognitive functions following unspecified cerebrovascular disease: Secondary | ICD-10-CM | POA: Diagnosis not present

## 2012-04-19 DIAGNOSIS — E119 Type 2 diabetes mellitus without complications: Secondary | ICD-10-CM | POA: Diagnosis not present

## 2012-04-19 DIAGNOSIS — I69919 Unspecified symptoms and signs involving cognitive functions following unspecified cerebrovascular disease: Secondary | ICD-10-CM | POA: Diagnosis not present

## 2012-04-19 DIAGNOSIS — R42 Dizziness and giddiness: Secondary | ICD-10-CM | POA: Diagnosis not present

## 2012-04-19 DIAGNOSIS — F329 Major depressive disorder, single episode, unspecified: Secondary | ICD-10-CM | POA: Diagnosis not present

## 2012-04-19 DIAGNOSIS — I69959 Hemiplegia and hemiparesis following unspecified cerebrovascular disease affecting unspecified side: Secondary | ICD-10-CM | POA: Diagnosis not present

## 2012-04-19 DIAGNOSIS — I10 Essential (primary) hypertension: Secondary | ICD-10-CM | POA: Diagnosis not present

## 2012-04-21 ENCOUNTER — Ambulatory Visit: Payer: Medicare Other | Attending: Neurology | Admitting: Occupational Therapy

## 2012-04-21 ENCOUNTER — Ambulatory Visit: Payer: Medicare Other | Admitting: Physical Therapy

## 2012-04-21 DIAGNOSIS — R269 Unspecified abnormalities of gait and mobility: Secondary | ICD-10-CM | POA: Insufficient documentation

## 2012-04-21 DIAGNOSIS — M2560 Stiffness of unspecified joint, not elsewhere classified: Secondary | ICD-10-CM | POA: Diagnosis not present

## 2012-04-21 DIAGNOSIS — M629 Disorder of muscle, unspecified: Secondary | ICD-10-CM | POA: Diagnosis not present

## 2012-04-21 DIAGNOSIS — R279 Unspecified lack of coordination: Secondary | ICD-10-CM | POA: Diagnosis not present

## 2012-04-21 DIAGNOSIS — Z5189 Encounter for other specified aftercare: Secondary | ICD-10-CM | POA: Diagnosis not present

## 2012-04-21 DIAGNOSIS — I69919 Unspecified symptoms and signs involving cognitive functions following unspecified cerebrovascular disease: Secondary | ICD-10-CM | POA: Insufficient documentation

## 2012-04-21 DIAGNOSIS — I69998 Other sequelae following unspecified cerebrovascular disease: Secondary | ICD-10-CM | POA: Insufficient documentation

## 2012-04-21 DIAGNOSIS — M6281 Muscle weakness (generalized): Secondary | ICD-10-CM | POA: Diagnosis not present

## 2012-04-21 DIAGNOSIS — M2420 Disorder of ligament, unspecified site: Secondary | ICD-10-CM | POA: Insufficient documentation

## 2012-04-25 ENCOUNTER — Ambulatory Visit (HOSPITAL_BASED_OUTPATIENT_CLINIC_OR_DEPARTMENT_OTHER): Payer: Medicare Other | Admitting: Physical Medicine & Rehabilitation

## 2012-04-25 ENCOUNTER — Encounter: Payer: Self-pay | Admitting: Physical Medicine & Rehabilitation

## 2012-04-25 ENCOUNTER — Encounter: Payer: Medicare Other | Attending: Physical Medicine & Rehabilitation

## 2012-04-25 VITALS — BP 109/58 | HR 74 | Resp 16 | Ht 71.0 in | Wt 197.0 lb

## 2012-04-25 DIAGNOSIS — M75100 Unspecified rotator cuff tear or rupture of unspecified shoulder, not specified as traumatic: Secondary | ICD-10-CM

## 2012-04-25 DIAGNOSIS — G8110 Spastic hemiplegia affecting unspecified side: Secondary | ICD-10-CM | POA: Diagnosis not present

## 2012-04-25 DIAGNOSIS — I69959 Hemiplegia and hemiparesis following unspecified cerebrovascular disease affecting unspecified side: Secondary | ICD-10-CM | POA: Insufficient documentation

## 2012-04-25 DIAGNOSIS — M7550 Bursitis of unspecified shoulder: Secondary | ICD-10-CM

## 2012-04-25 DIAGNOSIS — G8111 Spastic hemiplegia affecting right dominant side: Secondary | ICD-10-CM

## 2012-04-25 MED ORDER — METHYLPREDNISOLONE 4 MG PO KIT: PACK | ORAL | Status: AC

## 2012-04-25 NOTE — Progress Notes (Signed)
Subjective:    Patient ID: Darrell [name], male    DOB: 1946/03/10, 66 y.o.   MRN: [id number]  HPI Darrell [name] is a 66 year old male  with history of diabetes mellitus, hypertension, severe left carotid  artery stenosis admitted December 09, 2011 for left CA by Dr. Hart Rochester, in  recovery. The patient with decreased level of consciousness and  inability to move right side. He was taken back to OR emergently for  exploration of left CA with thrombectomy of endarterectomized surface And distal ICA. Neurology and Heme/Onc was consulted for input. 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 No significant stenosis. Hypercoag  studies were ordered and the patient was noted to have positive lupus  anticoagulant, but not reliable due to proximity of treatment with  heparin.  Botox injection performed in 02/26/2012 with improvement in tone right upper extremity Right subacromial injection performed one month ago with improvement but recurrence of pain Pain Inventory Average Pain 4 Pain Right Now 0 My pain is aching  In the last 24 hours, has pain interfered with the following? General activity 5 Relation with others 0 Enjoyment of life 4 What TIME of day is your pain at its worst? daytime Sleep (in general) Good  Pain is worse with: unsure Pain improves with: rest Relief from Meds: N/A  Mobility walk without assistance walk with assistance use a cane how many minutes can you walk? 20 ability to climb steps?  no do you drive?  no  Function retired I need assistance with the following:  bathing, household duties and shopping  Neuro/Psych confusion depression  Prior Studies Any changes since last visit?  no  Physicians involved in your care Any changes since last visit?  no   Family History  Problem Relation Age of Onset  . Heart disease Father    History   Social History  .  Marital Status: Married    Spouse Name: N/A    Number of Children: N/A  . Years of Education: N/A   Social History Main Topics  . Smoking status: Former Smoker -- 2.5 packs/day for 30 years    Types: Cigarettes    Quit date: 12/07/2001  . Smokeless tobacco: Never Used  . Alcohol Use: Yes     socially  . Drug Use: No  . Sexually Active: None   Other Topics Concern  . None   Social History Narrative  . None   Past Surgical History  Procedure Date  . Hernia repair   . Heel spur surgery   . Heer surgery   . Endarterectomy 12/09/2011    Procedure: ENDARTERECTOMY CAROTID;  Surgeon: [name], MD;  Location: [facility] OR;  Service: Vascular;  Laterality: Left;  Left Carotid endarterectomy with dacron patch angioplasty  . Carotid endarterectomy 12/09/11    Left   Past Medical History  Diagnosis Date  . Diabetes mellitus   . Sleep apnea     cpap      sleep study > 4 yrs  . Hypertension     dr todd   BP 109/58  Pulse 74  Resp 16  Ht 5\' 11"  (1.803 m)  Wt 197 lb (89.359 kg)  BMI 27.48 kg/m2  SpO2 95%      Review of Systems  Constitutional: Negative.   HENT: Negative.   Eyes: Negative.   Respiratory: Negative.   Cardiovascular: Negative.   Gastrointestinal: Negative.   Genitourinary: Negative.  Musculoskeletal: Negative.   Skin: Negative.   Neurological: Negative.   Hematological: Negative.   Psychiatric/Behavioral: Positive for confusion.       Objective:   Physical Exam  Constitutional: He appears well-developed and well-nourished.  Eyes: Conjunctivae and EOM are normal. Pupils are equal, round, and reactive to light.  Musculoskeletal:       Right shoulder: He exhibits decreased range of motion, deformity, pain and decreased strength. He exhibits no tenderness, no swelling and no effusion.       Right wrist: He exhibits swelling. He exhibits no effusion.       Right hand: He exhibits decreased range of motion. He exhibits no tenderness. decreased sensation  noted. Decreased strength noted. He exhibits finger abduction, thumb/finger opposition and wrist extension trouble.       Swelling on the dorsum of the hand and wrist. Extensor contracture of the PIPs MCPs and DIPs. Pain with attempted flexion. Right shoulder subluxation 1 finger breath. Pain with external rotation. Ambulation shows mild toe drag on the right side. No knee instability.  Neurological: He is alert. A sensory deficit is present. He exhibits abnormal muscle tone. Coordination and gait abnormal.  Reflex Scores:      Tricep reflexes are 3+ on the right side and 2+ on the left side.      Bicep reflexes are 3+ on the right side and 2+ on the left side.      Brachioradialis reflexes are 3+ on the right side and 2+ on the left side.      Patellar reflexes are 3+ on the right side and 2+ on the left side.      Achilles reflexes are 3+ on the right side and 2+ on the left side.      Increased flexor tone in the right upper extremity in the arm and forearm. Motor strength is 2 minus at the right deltoid bicep tricep and grip 4/5 in the right hip flexor knee extensor 3 minus at the ankle dorsiflexor and plantar flexor.          Assessment & Plan:  1. 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 Right subacromial bursitis post stroke shoulder painwill do subacromial bursa injection given that he is not a good candidate for nonsteroidals given his recent stroke .  Will ultrasound at next visit 3.  Possible RSD RUE medrol dosepack if diabetes is well controlled Right subacromial bursa injection  Indication right subacromial bursitis post stroke shoulder pain  Injectate 1 cc of 40 marked cc Depo-Medrol and 4 cc of 1% lidocaine  Informed consent was obtained after describing risks and benefits of the procedure with the patient these include bleeding bruising and infection he elects to proceed and has given written consent    Patient placed in a seated position area marked and prepped with ink pen plus Betadine alcohol and then entered with a 25-gauge 1/2 inch needle. After negative draw back for blood the Depo-Medrol lidocaine solution was injected. Patient tolerated procedure well. Post procedure instructions given.

## 2012-04-25 NOTE — Patient Instructions (Addendum)
Please monitor your blood sugars while you are on the Medrol Dosepak Stop if blood sugar is >300

## 2012-04-26 ENCOUNTER — Ambulatory Visit: Payer: Medicare Other | Attending: Neurology

## 2012-04-26 DIAGNOSIS — M629 Disorder of muscle, unspecified: Secondary | ICD-10-CM | POA: Diagnosis not present

## 2012-04-26 DIAGNOSIS — I69919 Unspecified symptoms and signs involving cognitive functions following unspecified cerebrovascular disease: Secondary | ICD-10-CM | POA: Diagnosis not present

## 2012-04-26 DIAGNOSIS — R269 Unspecified abnormalities of gait and mobility: Secondary | ICD-10-CM | POA: Diagnosis not present

## 2012-04-26 DIAGNOSIS — Z5189 Encounter for other specified aftercare: Secondary | ICD-10-CM | POA: Insufficient documentation

## 2012-04-26 DIAGNOSIS — M2560 Stiffness of unspecified joint, not elsewhere classified: Secondary | ICD-10-CM | POA: Diagnosis not present

## 2012-04-26 DIAGNOSIS — M2420 Disorder of ligament, unspecified site: Secondary | ICD-10-CM | POA: Insufficient documentation

## 2012-04-26 DIAGNOSIS — R279 Unspecified lack of coordination: Secondary | ICD-10-CM | POA: Diagnosis not present

## 2012-04-26 DIAGNOSIS — I69998 Other sequelae following unspecified cerebrovascular disease: Secondary | ICD-10-CM | POA: Insufficient documentation

## 2012-04-26 DIAGNOSIS — M6281 Muscle weakness (generalized): Secondary | ICD-10-CM | POA: Insufficient documentation

## 2012-05-03 ENCOUNTER — Ambulatory Visit: Payer: Medicare Other

## 2012-05-03 ENCOUNTER — Ambulatory Visit: Payer: Medicare Other | Admitting: Physical Therapy

## 2012-05-03 ENCOUNTER — Ambulatory Visit: Payer: Medicare Other | Admitting: Occupational Therapy

## 2012-05-03 DIAGNOSIS — R279 Unspecified lack of coordination: Secondary | ICD-10-CM | POA: Diagnosis not present

## 2012-05-03 DIAGNOSIS — M6281 Muscle weakness (generalized): Secondary | ICD-10-CM | POA: Diagnosis not present

## 2012-05-03 DIAGNOSIS — I69998 Other sequelae following unspecified cerebrovascular disease: Secondary | ICD-10-CM | POA: Diagnosis not present

## 2012-05-03 DIAGNOSIS — R269 Unspecified abnormalities of gait and mobility: Secondary | ICD-10-CM | POA: Diagnosis not present

## 2012-05-03 DIAGNOSIS — Z5189 Encounter for other specified aftercare: Secondary | ICD-10-CM | POA: Diagnosis not present

## 2012-05-03 DIAGNOSIS — M2560 Stiffness of unspecified joint, not elsewhere classified: Secondary | ICD-10-CM | POA: Diagnosis not present

## 2012-05-04 ENCOUNTER — Ambulatory Visit: Payer: Medicare Other | Admitting: Occupational Therapy

## 2012-05-04 DIAGNOSIS — I69998 Other sequelae following unspecified cerebrovascular disease: Secondary | ICD-10-CM | POA: Diagnosis not present

## 2012-05-04 DIAGNOSIS — M6281 Muscle weakness (generalized): Secondary | ICD-10-CM | POA: Diagnosis not present

## 2012-05-04 DIAGNOSIS — R279 Unspecified lack of coordination: Secondary | ICD-10-CM | POA: Diagnosis not present

## 2012-05-04 DIAGNOSIS — M2560 Stiffness of unspecified joint, not elsewhere classified: Secondary | ICD-10-CM | POA: Diagnosis not present

## 2012-05-04 DIAGNOSIS — R269 Unspecified abnormalities of gait and mobility: Secondary | ICD-10-CM | POA: Diagnosis not present

## 2012-05-04 DIAGNOSIS — Z5189 Encounter for other specified aftercare: Secondary | ICD-10-CM | POA: Diagnosis not present

## 2012-05-09 ENCOUNTER — Ambulatory Visit (INDEPENDENT_AMBULATORY_CARE_PROVIDER_SITE_OTHER): Payer: Medicare Other | Admitting: Family Medicine

## 2012-05-09 ENCOUNTER — Encounter: Payer: Self-pay | Admitting: Family Medicine

## 2012-05-09 VITALS — BP 140/90 | Temp 98.3°F | Wt 196.0 lb

## 2012-05-09 DIAGNOSIS — I10 Essential (primary) hypertension: Secondary | ICD-10-CM | POA: Diagnosis not present

## 2012-05-09 DIAGNOSIS — E119 Type 2 diabetes mellitus without complications: Secondary | ICD-10-CM

## 2012-05-09 NOTE — Progress Notes (Signed)
  Subjective:    Patient ID: Darrell [name], male    DOB: 03-16-1946, 66 y.o.   MRN: [id number]  HPI [name] is a 66 year old married male nonsmoker who comes in today for followup of diabetes hypertension  His blood sugar on Lantus 25 units at bedtime along with metformin 1000 mg twice a day is in the 120 range  On 10 mg of lisinopril BP 140/90 her pressures at home are slightly lower  He's also complaining of pain in his toes both great toes. On physical exam there is some ingrowing I trimmed and both    Review of Systems    general and metabolic review of systems otherwise negative Objective:   Physical Exam Well-developed well-nourished male in no acute distress both ingrown toenails left and right were trimmed       Assessment & Plan:  Diabetes type 1 at goal continue current therapy  Hypertension at goal continue current therapy

## 2012-05-09 NOTE — Patient Instructions (Signed)
Soak and file your toenails nightly  Continue your current medications  Followup in 3 months labs one week prior

## 2012-05-10 ENCOUNTER — Ambulatory Visit: Payer: Medicare Other | Admitting: Physical Therapy

## 2012-05-10 ENCOUNTER — Ambulatory Visit: Payer: Medicare Other

## 2012-05-10 DIAGNOSIS — M2560 Stiffness of unspecified joint, not elsewhere classified: Secondary | ICD-10-CM | POA: Diagnosis not present

## 2012-05-10 DIAGNOSIS — R279 Unspecified lack of coordination: Secondary | ICD-10-CM | POA: Diagnosis not present

## 2012-05-10 DIAGNOSIS — Z5189 Encounter for other specified aftercare: Secondary | ICD-10-CM | POA: Diagnosis not present

## 2012-05-10 DIAGNOSIS — I69998 Other sequelae following unspecified cerebrovascular disease: Secondary | ICD-10-CM | POA: Diagnosis not present

## 2012-05-10 DIAGNOSIS — R269 Unspecified abnormalities of gait and mobility: Secondary | ICD-10-CM | POA: Diagnosis not present

## 2012-05-10 DIAGNOSIS — M6281 Muscle weakness (generalized): Secondary | ICD-10-CM | POA: Diagnosis not present

## 2012-05-12 ENCOUNTER — Ambulatory Visit: Payer: Medicare Other | Admitting: Occupational Therapy

## 2012-05-12 ENCOUNTER — Ambulatory Visit: Payer: Medicare Other | Admitting: Physical Therapy

## 2012-05-12 ENCOUNTER — Ambulatory Visit: Payer: Medicare Other

## 2012-05-12 DIAGNOSIS — I69998 Other sequelae following unspecified cerebrovascular disease: Secondary | ICD-10-CM | POA: Diagnosis not present

## 2012-05-12 DIAGNOSIS — M6281 Muscle weakness (generalized): Secondary | ICD-10-CM | POA: Diagnosis not present

## 2012-05-12 DIAGNOSIS — M2560 Stiffness of unspecified joint, not elsewhere classified: Secondary | ICD-10-CM | POA: Diagnosis not present

## 2012-05-12 DIAGNOSIS — Z5189 Encounter for other specified aftercare: Secondary | ICD-10-CM | POA: Diagnosis not present

## 2012-05-12 DIAGNOSIS — R269 Unspecified abnormalities of gait and mobility: Secondary | ICD-10-CM | POA: Diagnosis not present

## 2012-05-12 DIAGNOSIS — R279 Unspecified lack of coordination: Secondary | ICD-10-CM | POA: Diagnosis not present

## 2012-05-17 ENCOUNTER — Ambulatory Visit: Payer: Medicare Other | Admitting: Physical Therapy

## 2012-05-17 ENCOUNTER — Ambulatory Visit: Payer: Medicare Other | Admitting: Occupational Therapy

## 2012-05-17 ENCOUNTER — Ambulatory Visit: Payer: Medicare Other

## 2012-05-17 DIAGNOSIS — I69998 Other sequelae following unspecified cerebrovascular disease: Secondary | ICD-10-CM | POA: Diagnosis not present

## 2012-05-17 DIAGNOSIS — R279 Unspecified lack of coordination: Secondary | ICD-10-CM | POA: Diagnosis not present

## 2012-05-17 DIAGNOSIS — M6281 Muscle weakness (generalized): Secondary | ICD-10-CM | POA: Diagnosis not present

## 2012-05-17 DIAGNOSIS — M2560 Stiffness of unspecified joint, not elsewhere classified: Secondary | ICD-10-CM | POA: Diagnosis not present

## 2012-05-17 DIAGNOSIS — R269 Unspecified abnormalities of gait and mobility: Secondary | ICD-10-CM | POA: Diagnosis not present

## 2012-05-17 DIAGNOSIS — Z5189 Encounter for other specified aftercare: Secondary | ICD-10-CM | POA: Diagnosis not present

## 2012-05-20 ENCOUNTER — Ambulatory Visit: Payer: Medicare Other | Admitting: Physical Therapy

## 2012-05-20 ENCOUNTER — Ambulatory Visit: Payer: Medicare Other | Admitting: Occupational Therapy

## 2012-05-20 ENCOUNTER — Ambulatory Visit: Payer: Medicare Other

## 2012-05-20 DIAGNOSIS — I69998 Other sequelae following unspecified cerebrovascular disease: Secondary | ICD-10-CM | POA: Diagnosis not present

## 2012-05-20 DIAGNOSIS — M6281 Muscle weakness (generalized): Secondary | ICD-10-CM | POA: Diagnosis not present

## 2012-05-20 DIAGNOSIS — M2560 Stiffness of unspecified joint, not elsewhere classified: Secondary | ICD-10-CM | POA: Diagnosis not present

## 2012-05-20 DIAGNOSIS — R269 Unspecified abnormalities of gait and mobility: Secondary | ICD-10-CM | POA: Diagnosis not present

## 2012-05-20 DIAGNOSIS — R279 Unspecified lack of coordination: Secondary | ICD-10-CM | POA: Diagnosis not present

## 2012-05-20 DIAGNOSIS — Z5189 Encounter for other specified aftercare: Secondary | ICD-10-CM | POA: Diagnosis not present

## 2012-05-23 ENCOUNTER — Ambulatory Visit: Payer: Medicare Other | Admitting: Physical Therapy

## 2012-05-23 ENCOUNTER — Ambulatory Visit: Payer: Medicare Other | Admitting: Occupational Therapy

## 2012-05-23 ENCOUNTER — Ambulatory Visit: Payer: Medicare Other

## 2012-05-23 DIAGNOSIS — M6281 Muscle weakness (generalized): Secondary | ICD-10-CM | POA: Diagnosis not present

## 2012-05-23 DIAGNOSIS — M2560 Stiffness of unspecified joint, not elsewhere classified: Secondary | ICD-10-CM | POA: Diagnosis not present

## 2012-05-23 DIAGNOSIS — I69998 Other sequelae following unspecified cerebrovascular disease: Secondary | ICD-10-CM | POA: Diagnosis not present

## 2012-05-23 DIAGNOSIS — Z5189 Encounter for other specified aftercare: Secondary | ICD-10-CM | POA: Diagnosis not present

## 2012-05-23 DIAGNOSIS — R269 Unspecified abnormalities of gait and mobility: Secondary | ICD-10-CM | POA: Diagnosis not present

## 2012-05-23 DIAGNOSIS — R279 Unspecified lack of coordination: Secondary | ICD-10-CM | POA: Diagnosis not present

## 2012-05-24 ENCOUNTER — Encounter: Payer: Self-pay | Admitting: Physical Medicine & Rehabilitation

## 2012-05-24 ENCOUNTER — Ambulatory Visit (HOSPITAL_BASED_OUTPATIENT_CLINIC_OR_DEPARTMENT_OTHER): Payer: Medicare Other | Admitting: Physical Medicine & Rehabilitation

## 2012-05-24 VITALS — BP 117/70 | HR 66 | Resp 16 | Ht 71.0 in | Wt 196.2 lb

## 2012-05-24 DIAGNOSIS — M19019 Primary osteoarthritis, unspecified shoulder: Secondary | ICD-10-CM

## 2012-05-24 DIAGNOSIS — M7521 Bicipital tendinitis, right shoulder: Secondary | ICD-10-CM

## 2012-05-24 DIAGNOSIS — M7520 Bicipital tendinitis, unspecified shoulder: Secondary | ICD-10-CM | POA: Diagnosis not present

## 2012-05-24 DIAGNOSIS — M19011 Primary osteoarthritis, right shoulder: Secondary | ICD-10-CM

## 2012-05-24 NOTE — Patient Instructions (Signed)
You have arthritis of your shoulder joint You have arthritis of your right a.c. Joint You have tendinitis of the right biceps tendon We will inject around the right biceps tendon next visit under ultrasound guidance

## 2012-05-24 NOTE — Progress Notes (Signed)
Right shoulder diagnostic complete ultrasound Indication hemiplegia shoulder pain. Pain only partially response to medication management and conservative care Patient placed in a seated position. Linear transducer used. The right biceps tendon was scant in short access and long axis views. There was partial subluxation with external rotation of the right biceps tendon. On long axis views there is evidence of fluid surrounding the biceps tendon Right supraspinatus long axis views showed mild thickening of tendon but no evidence of tear. Short axis views were normal however: Humeral head with irregularity consistent with osteoarthritis Right a.c. Joint shows osteoarthritis with irregularity on both acromial and clavicular side of joint Right infraspinatus with normal  Long axis and short axis views no evidence of tear  Impression  1. Abnormal study 2. 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 Right biceps tendon tenosynovitis

## 2012-05-24 NOTE — Progress Notes (Signed)
  Subjective:    Patient ID: Darrell [name], male    DOB: 02-24-1946, 66 y.o.   MRN: [id number]  HPI Right shoulder pain chief complaint no trauma. Undergoing outpatient physical therapy.  Pain Inventory Average Pain 3 Pain Right Now 3 My pain is intermittent  In the last 24 hours, has pain interfered with the following? General activity 4 Relation with others 0 Enjoyment of life 3 What TIME of day is your pain at its worst? morning Sleep (in general) Good  Pain is worse with: some activites Pain improves with: therapy/exercise Relief from Meds: not taking pain medication  Mobility walk without assistance use a cane how many minutes can you walk? 60 ability to climb steps?  yes do you drive?  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  no  Physicians involved in your care Any changes since last visit?  no   Family History  Problem Relation Age of Onset  . Heart disease Father    History   Social History  . Marital Status: Married    Spouse Name: N/A    Number of Children: N/A  . Years of Education: N/A   Social History Main Topics  . Smoking status: Former Smoker -- 2.5 packs/day for 30 years    Types: Cigarettes    Quit date: 12/07/2001  . Smokeless tobacco: Never Used  . Alcohol Use: Yes     socially  . Drug Use: No  . Sexually Active: None   Other Topics Concern  . None   Social History Narrative  . None   Past Surgical History  Procedure Date  . Hernia repair   . Heel spur surgery   . Heer surgery   . Endarterectomy 12/09/2011    Procedure: ENDARTERECTOMY CAROTID;  Surgeon: [name], MD;  Location: [facility] OR;  Service: Vascular;  Laterality: Left;  Left Carotid endarterectomy with dacron patch angioplasty  . Carotid  endarterectomy 12/09/11    Left   Past Medical History  Diagnosis Date  . Diabetes mellitus   . Sleep apnea     cpap      sleep study > 4 yrs  . Hypertension     dr todd   BP 117/70  Pulse 66  Resp 16  Ht 5\' 11"  (1.803 m)  Wt 196 lb 3.2 oz (88.996 kg)  BMI 27.36 kg/m2  SpO2 97%   Review of Systems  Musculoskeletal: Positive for gait problem.       Shoulder pain  Psychiatric/Behavioral: Positive for confusion.  All other systems reviewed and are negative.       Objective:   Physical Exam Pain with abduction of the right shoulder. Atrophy and subluxation inferiorly the right shoulder. No tenderness to palpation over the right a.c. Joint. Tenderness to palpation over the biceps tendon.       Assessment & Plan:  1. Right shoulder pain post CVA. Temporary relief with subacromial bursa injection 2. Ultrasound today demonstrating glenohumeral arthritis, a.c. Joint arthritis, right bicipital tendinitis Based on his clinical exam will inject his right biceps tendon under ultrasound guidance and approximately 2-3 weeks Please refer to ultrasound report

## 2012-05-25 ENCOUNTER — Ambulatory Visit: Payer: Medicare Other | Admitting: Occupational Therapy

## 2012-05-25 ENCOUNTER — Ambulatory Visit: Payer: Medicare Other

## 2012-05-25 ENCOUNTER — Ambulatory Visit: Payer: Medicare Other | Admitting: Physical Therapy

## 2012-05-25 DIAGNOSIS — M2560 Stiffness of unspecified joint, not elsewhere classified: Secondary | ICD-10-CM | POA: Diagnosis not present

## 2012-05-25 DIAGNOSIS — R269 Unspecified abnormalities of gait and mobility: Secondary | ICD-10-CM | POA: Diagnosis not present

## 2012-05-25 DIAGNOSIS — M6281 Muscle weakness (generalized): Secondary | ICD-10-CM | POA: Diagnosis not present

## 2012-05-25 DIAGNOSIS — I69998 Other sequelae following unspecified cerebrovascular disease: Secondary | ICD-10-CM | POA: Diagnosis not present

## 2012-05-25 DIAGNOSIS — Z5189 Encounter for other specified aftercare: Secondary | ICD-10-CM | POA: Diagnosis not present

## 2012-05-25 DIAGNOSIS — R279 Unspecified lack of coordination: Secondary | ICD-10-CM | POA: Diagnosis not present

## 2012-05-30 ENCOUNTER — Ambulatory Visit: Payer: Medicare Other

## 2012-05-30 ENCOUNTER — Ambulatory Visit: Payer: Medicare Other | Attending: Neurology | Admitting: Physical Therapy

## 2012-05-30 ENCOUNTER — Ambulatory Visit: Payer: Medicare Other | Admitting: Occupational Therapy

## 2012-05-30 DIAGNOSIS — I69919 Unspecified symptoms and signs involving cognitive functions following unspecified cerebrovascular disease: Secondary | ICD-10-CM | POA: Insufficient documentation

## 2012-05-30 DIAGNOSIS — I69998 Other sequelae following unspecified cerebrovascular disease: Secondary | ICD-10-CM | POA: Diagnosis not present

## 2012-05-30 DIAGNOSIS — M2560 Stiffness of unspecified joint, not elsewhere classified: Secondary | ICD-10-CM | POA: Diagnosis not present

## 2012-05-30 DIAGNOSIS — M629 Disorder of muscle, unspecified: Secondary | ICD-10-CM | POA: Diagnosis not present

## 2012-05-30 DIAGNOSIS — R269 Unspecified abnormalities of gait and mobility: Secondary | ICD-10-CM | POA: Diagnosis not present

## 2012-05-30 DIAGNOSIS — Z5189 Encounter for other specified aftercare: Secondary | ICD-10-CM | POA: Insufficient documentation

## 2012-05-30 DIAGNOSIS — M2420 Disorder of ligament, unspecified site: Secondary | ICD-10-CM | POA: Insufficient documentation

## 2012-05-30 DIAGNOSIS — M6281 Muscle weakness (generalized): Secondary | ICD-10-CM | POA: Insufficient documentation

## 2012-05-30 DIAGNOSIS — R279 Unspecified lack of coordination: Secondary | ICD-10-CM | POA: Diagnosis not present

## 2012-06-01 ENCOUNTER — Ambulatory Visit: Payer: Medicare Other | Admitting: Occupational Therapy

## 2012-06-01 ENCOUNTER — Ambulatory Visit: Payer: Medicare Other | Admitting: Physical Therapy

## 2012-06-01 ENCOUNTER — Ambulatory Visit: Payer: Medicare Other

## 2012-06-08 ENCOUNTER — Ambulatory Visit: Payer: Medicare Other

## 2012-06-08 ENCOUNTER — Ambulatory Visit: Payer: Medicare Other | Admitting: *Deleted

## 2012-06-08 ENCOUNTER — Ambulatory Visit: Payer: Medicare Other | Admitting: Occupational Therapy

## 2012-06-09 DIAGNOSIS — D3130 Benign neoplasm of unspecified choroid: Secondary | ICD-10-CM | POA: Diagnosis not present

## 2012-06-09 DIAGNOSIS — E119 Type 2 diabetes mellitus without complications: Secondary | ICD-10-CM | POA: Diagnosis not present

## 2012-06-09 DIAGNOSIS — H40059 Ocular hypertension, unspecified eye: Secondary | ICD-10-CM | POA: Diagnosis not present

## 2012-06-09 DIAGNOSIS — H35379 Puckering of macula, unspecified eye: Secondary | ICD-10-CM | POA: Diagnosis not present

## 2012-06-10 ENCOUNTER — Ambulatory Visit: Payer: Medicare Other | Admitting: Physical Therapy

## 2012-06-10 ENCOUNTER — Ambulatory Visit: Payer: Medicare Other | Admitting: Occupational Therapy

## 2012-06-10 ENCOUNTER — Encounter: Payer: Medicare Other | Admitting: Occupational Therapy

## 2012-06-10 ENCOUNTER — Ambulatory Visit: Payer: Medicare Other

## 2012-06-14 ENCOUNTER — Ambulatory Visit: Payer: Medicare Other | Admitting: Physical Therapy

## 2012-06-14 ENCOUNTER — Encounter: Payer: Medicare Other | Attending: Physical Medicine & Rehabilitation

## 2012-06-14 ENCOUNTER — Ambulatory Visit (HOSPITAL_BASED_OUTPATIENT_CLINIC_OR_DEPARTMENT_OTHER): Payer: Medicare Other | Admitting: Physical Medicine & Rehabilitation

## 2012-06-14 ENCOUNTER — Ambulatory Visit: Payer: Medicare Other | Admitting: Occupational Therapy

## 2012-06-14 ENCOUNTER — Encounter: Payer: Self-pay | Admitting: Physical Medicine & Rehabilitation

## 2012-06-14 ENCOUNTER — Ambulatory Visit: Payer: Medicare Other

## 2012-06-14 VITALS — BP 122/72 | HR 72 | Resp 14 | Ht 70.0 in | Wt 196.0 lb

## 2012-06-14 DIAGNOSIS — I69959 Hemiplegia and hemiparesis following unspecified cerebrovascular disease affecting unspecified side: Secondary | ICD-10-CM | POA: Diagnosis not present

## 2012-06-14 DIAGNOSIS — M7520 Bicipital tendinitis, unspecified shoulder: Secondary | ICD-10-CM

## 2012-06-14 DIAGNOSIS — M7521 Bicipital tendinitis, right shoulder: Secondary | ICD-10-CM

## 2012-06-14 NOTE — Progress Notes (Signed)
  Subjective:    Patient ID: Darrell [name], male    DOB: Apr 15, 1946, 66 y.o.   MRN: [id number]  HPI  Pain Inventory Average Pain 4 Pain Right Now 3 My pain is dull  In the last 24 hours, has pain interfered with the following? General activity 3 Relation with others 0 Enjoyment of life 0 What TIME of day is your pain at its worst? morning Sleep (in general) Good  Pain is worse with: unsure Pain improves with: rest Relief from Meds: 3  Mobility walk without assistance use a cane how many minutes can you walk? 60 ability to climb steps?  yes do you drive?  no transfers alone Do you have any goals in this area?  yes  Function retired I need assistance with the following:  bathing, meal prep, household duties and shopping Do you have any goals in this area?  yes  Neuro/Psych weakness tingling  Prior Studies Any changes since last visit?  no  Physicians involved in your care Any changes since last visit?  no   Family History  Problem Relation Age of Onset  . Heart disease Father    History   Social History  . Marital Status: Married    Spouse Name: N/A    Number of Children: N/A  . Years of Education: N/A   Social History Main Topics  . Smoking status: Former Smoker -- 2.5 packs/day for 30 years    Types: Cigarettes    Quit date: 12/07/2001  . Smokeless tobacco: Never Used  . Alcohol Use: Yes     socially  . Drug Use: No  . Sexually Active: None   Other Topics Concern  . None   Social History Narrative  . None   Past Surgical History  Procedure Date  . Hernia repair   . Heel spur surgery   . Heer surgery   . Endarterectomy 12/09/2011    Procedure: ENDARTERECTOMY CAROTID;  Surgeon: [name], MD;  Location: [facility] OR;  Service: Vascular;  Laterality: Left;  Left Carotid endarterectomy with dacron patch angioplasty  . Carotid endarterectomy 12/09/11    Left   Past Medical History  Diagnosis Date  . Diabetes mellitus   . Sleep apnea    cpap      sleep study > 4 yrs  . Hypertension     dr todd   BP 122/72  Pulse 72  Resp 14  Ht 5\' 10"  (1.778 m)  Wt 196 lb (88.905 kg)  BMI 28.12 kg/m2     Review of Systems  Musculoskeletal: Positive for myalgias, arthralgias and gait problem.  Neurological: Positive for weakness.  All other systems reviewed and are negative.       Objective:   Physical Exam  Right anterior shoulder pain tenderness over the bicipital groove      Assessment & Plan:   Ultrasound-guided right bicipital tendon injection Indication right bicipital tendinitis with Tina synovitis noted on ultrasound Pain is only partially response to medication management as well as physical therapy. Patient placed in a seated position area was scanned marked and prepped with Betadine then entered with one 0.5 inch 25-gauge needle 1 cc of 1% lidocaine injected and then a 40 mm 22-gauge echo block needle was inserted under direct ultrasound visualization. After needle entered the bicipital tendon sheath 1 cc of 1% lidocaine and 1/2 cc of 40 mg/ cc Depo-Medrol were injected. Patient tolerated procedure well. Post procedure instructions given

## 2012-06-14 NOTE — Patient Instructions (Signed)
May ice the right shoulder for 20-30 minutes every 2 hours for the next 24 hours. May resume therapy this week

## 2012-06-15 DIAGNOSIS — L570 Actinic keratosis: Secondary | ICD-10-CM | POA: Diagnosis not present

## 2012-06-16 ENCOUNTER — Ambulatory Visit: Payer: Medicare Other | Admitting: Occupational Therapy

## 2012-06-16 ENCOUNTER — Ambulatory Visit: Payer: Medicare Other

## 2012-06-16 ENCOUNTER — Ambulatory Visit: Payer: Medicare Other | Admitting: Physical Therapy

## 2012-06-22 ENCOUNTER — Ambulatory Visit: Payer: Medicare Other

## 2012-06-23 ENCOUNTER — Ambulatory Visit: Payer: Medicare Other | Admitting: Occupational Therapy

## 2012-06-24 ENCOUNTER — Ambulatory Visit: Payer: Medicare Other

## 2012-06-29 ENCOUNTER — Ambulatory Visit: Payer: Medicare Other | Attending: Neurology

## 2012-06-29 DIAGNOSIS — R269 Unspecified abnormalities of gait and mobility: Secondary | ICD-10-CM | POA: Insufficient documentation

## 2012-06-29 DIAGNOSIS — M2560 Stiffness of unspecified joint, not elsewhere classified: Secondary | ICD-10-CM | POA: Diagnosis not present

## 2012-06-29 DIAGNOSIS — Z5189 Encounter for other specified aftercare: Secondary | ICD-10-CM | POA: Diagnosis not present

## 2012-06-29 DIAGNOSIS — I69998 Other sequelae following unspecified cerebrovascular disease: Secondary | ICD-10-CM | POA: Insufficient documentation

## 2012-06-29 DIAGNOSIS — I69919 Unspecified symptoms and signs involving cognitive functions following unspecified cerebrovascular disease: Secondary | ICD-10-CM | POA: Diagnosis not present

## 2012-06-29 DIAGNOSIS — I6350 Cerebral infarction due to unspecified occlusion or stenosis of unspecified cerebral artery: Secondary | ICD-10-CM | POA: Diagnosis not present

## 2012-06-29 DIAGNOSIS — M629 Disorder of muscle, unspecified: Secondary | ICD-10-CM | POA: Diagnosis not present

## 2012-06-29 DIAGNOSIS — M6281 Muscle weakness (generalized): Secondary | ICD-10-CM | POA: Diagnosis not present

## 2012-06-29 DIAGNOSIS — R279 Unspecified lack of coordination: Secondary | ICD-10-CM | POA: Insufficient documentation

## 2012-06-29 DIAGNOSIS — M2420 Disorder of ligament, unspecified site: Secondary | ICD-10-CM | POA: Insufficient documentation

## 2012-06-29 DIAGNOSIS — I63239 Cerebral infarction due to unspecified occlusion or stenosis of unspecified carotid arteries: Secondary | ICD-10-CM | POA: Diagnosis not present

## 2012-07-04 ENCOUNTER — Ambulatory Visit: Payer: Medicare Other | Admitting: Occupational Therapy

## 2012-07-04 ENCOUNTER — Ambulatory Visit: Payer: Medicare Other | Admitting: Speech Pathology

## 2012-07-06 ENCOUNTER — Ambulatory Visit: Payer: Medicare Other | Admitting: Occupational Therapy

## 2012-07-06 ENCOUNTER — Ambulatory Visit: Payer: Medicare Other

## 2012-07-12 ENCOUNTER — Ambulatory Visit (HOSPITAL_BASED_OUTPATIENT_CLINIC_OR_DEPARTMENT_OTHER): Payer: Medicare Other | Admitting: Physical Medicine & Rehabilitation

## 2012-07-12 ENCOUNTER — Ambulatory Visit: Payer: Medicare Other | Admitting: Speech Pathology

## 2012-07-12 ENCOUNTER — Encounter: Payer: Medicare Other | Attending: Physical Medicine & Rehabilitation

## 2012-07-12 ENCOUNTER — Encounter: Payer: Self-pay | Admitting: Physical Medicine & Rehabilitation

## 2012-07-12 VITALS — BP 126/83 | HR 64 | Resp 12 | Ht 71.0 in | Wt 194.4 lb

## 2012-07-12 DIAGNOSIS — M7520 Bicipital tendinitis, unspecified shoulder: Secondary | ICD-10-CM | POA: Diagnosis not present

## 2012-07-12 DIAGNOSIS — G8110 Spastic hemiplegia affecting unspecified side: Secondary | ICD-10-CM

## 2012-07-12 DIAGNOSIS — I69959 Hemiplegia and hemiparesis following unspecified cerebrovascular disease affecting unspecified side: Secondary | ICD-10-CM | POA: Diagnosis not present

## 2012-07-12 DIAGNOSIS — M7521 Bicipital tendinitis, right shoulder: Secondary | ICD-10-CM | POA: Insufficient documentation

## 2012-07-12 NOTE — Progress Notes (Signed)
Subjective:    Patient ID: Darrell [name], male    DOB: 01-27-1946, 66 y.o.   MRN: [id number]  HPI Darrell [name] is a 66 year old male  with history of diabetes mellitus, hypertension, severe left carotid  artery stenosis admitted December 09, 2011 for left CA by Dr. Hart Rochester, in  recovery. The patient with decreased level of consciousness and  inability to move right side. He was taken back to OR emergently for  exploration of left CA with thrombectomy of endarterectomized surface And distal ICA. Neurology and Heme/Onc was consulted for input. 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 No significant stenosis. Hypercoag  studies were ordered and the patient was noted to have positive lupus  anticoagulant, but not reliable due to proximity of treatment with  heparin.  Botox injection performed in 02/26/2012 with improvement in tone right upper extremity  increasing  Elbow extension problems over the last month. Right bicipital tendon injection was helpful for her shoulder pain although he still has residual pain. Wearing wrist hand orthosis at night Continues with OT and speech therapy. Graduated from physical therapy. Pain Inventory Average Pain 3 Pain Right Now 0 My pain is intermittent and varies  In the last 24 hours, has pain interfered with the following? General activity 2 Relation with others 2 Enjoyment of life 2 What TIME of day is your pain at its worst? morning Sleep (in general) Good  Pain is worse with: unsure Pain improves with: unsure Relief from Meds: 2  Mobility walk without assistance use a cane how many minutes can you walk? 30-45 ability to climb steps?  yes do you drive?  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  no  Physicians involved in your care Any changes since last visit?  no   Family History  Problem Relation Age of Onset  . Heart disease Father    History   Social History  . Marital Status: Married    Spouse Name: N/A    Number of Children: N/A  . Years of Education: N/A   Social History Main Topics  . Smoking status: Former Smoker -- 2.5 packs/day for 30 years    Types: Cigarettes    Quit date: 12/07/2001  . Smokeless tobacco: Never Used  . Alcohol Use: Yes     socially  . Drug Use: No  . Sexually Active: None   Other Topics Concern  . None   Social History Narrative  . None   Past Surgical History  Procedure Date  . Hernia repair   . Heel spur surgery   . Heer surgery   . Endarterectomy 12/09/2011    Procedure: ENDARTERECTOMY CAROTID;  Surgeon: [name], MD;  Location: [facility] OR;  Service: Vascular;  Laterality: Left;  Left Carotid endarterectomy with dacron patch angioplasty  . Carotid endarterectomy 12/09/11    Left   Past Medical History  Diagnosis Date  . Diabetes mellitus   . Sleep apnea     cpap      sleep study > 4 yrs  . Hypertension     dr todd   BP 126/83  Pulse 64  Resp 12  Ht 5\' 11"  (1.803 m)  Wt 194 lb 6.4 oz (88.179 kg)  BMI 27.11 kg/m2  SpO2 97%    Review of Systems  Respiratory: Positive for apnea.   Musculoskeletal: Positive for gait problem.  Neurological: Positive for weakness and numbness.       Tingling  Psychiatric/Behavioral: Positive for dysphoric mood. The patient is nervous/anxious.   All other systems reviewed and are negative.       Objective:   Physical Exam  Constitutional: He is oriented to person, place, and time. He appears well-developed and well-nourished.  HENT:  Head: Normocephalic and atraumatic.  Eyes: Conjunctivae normal and EOM are normal. Pupils are  equal, round, and reactive to light.  Musculoskeletal:       Right hand: He exhibits decreased range of motion. He exhibits no tenderness and no swelling. Decreased strength noted. He exhibits finger abduction, thumb/finger opposition and wrist extension trouble.       Right shoulder subluxation. Increasing tone in the right pronator teres with pronator contracture MCP PIP extension contractures  Neurological: He is alert and oriented to person, place, and time. He displays atrophy and abnormal reflex. No sensory deficit. He exhibits abnormal muscle tone. Coordination abnormal.       Motor strength is 2 minus in the right deltoid 3 minus in the right biceps triceps finger flexors and finger extensors   Psychiatric: He has a normal mood and affect.          Assessment & Plan:  1. Hemiplegic shoulder pain. Multifactorial. Has bicipital tendinitis as well as increased tone as well as frozen shoulder 2. Spastic hemiparesis right side scheduled for Botox injection to right bicep as well as right pronator teres 200 units Continue outpatient OT and speech

## 2012-07-12 NOTE — Patient Instructions (Signed)
Next visit will be for Botox injection. 

## 2012-07-14 ENCOUNTER — Ambulatory Visit: Payer: Medicare Other | Admitting: *Deleted

## 2012-07-19 ENCOUNTER — Ambulatory Visit: Payer: Medicare Other | Admitting: Occupational Therapy

## 2012-07-21 ENCOUNTER — Ambulatory Visit: Payer: Medicare Other | Admitting: Occupational Therapy

## 2012-07-22 DIAGNOSIS — L570 Actinic keratosis: Secondary | ICD-10-CM | POA: Diagnosis not present

## 2012-07-25 ENCOUNTER — Encounter: Payer: Self-pay | Admitting: Vascular Surgery

## 2012-07-26 ENCOUNTER — Other Ambulatory Visit: Payer: Medicare Other

## 2012-07-26 ENCOUNTER — Ambulatory Visit: Payer: Medicare Other | Admitting: Vascular Surgery

## 2012-08-02 ENCOUNTER — Ambulatory Visit (HOSPITAL_BASED_OUTPATIENT_CLINIC_OR_DEPARTMENT_OTHER): Payer: Medicare Other | Admitting: Physical Medicine & Rehabilitation

## 2012-08-02 ENCOUNTER — Encounter: Payer: Self-pay | Admitting: Physical Medicine & Rehabilitation

## 2012-08-02 VITALS — BP 129/67 | HR 62 | Resp 14 | Ht 71.0 in | Wt 195.0 lb

## 2012-08-02 DIAGNOSIS — I69959 Hemiplegia and hemiparesis following unspecified cerebrovascular disease affecting unspecified side: Secondary | ICD-10-CM | POA: Diagnosis not present

## 2012-08-02 DIAGNOSIS — G8191 Hemiplegia, unspecified affecting right dominant side: Secondary | ICD-10-CM

## 2012-08-02 DIAGNOSIS — G8190 Hemiplegia, unspecified affecting unspecified side: Secondary | ICD-10-CM | POA: Diagnosis not present

## 2012-08-02 NOTE — Progress Notes (Signed)
Botox Injection for spasticity using needle EMG guidance  Dilution: 50 Units/ml Indication: Severe spasticity which interferes with ADL,mobility and/or  hygiene and is unresponsive to medication management and other conservative care Informed consent was obtained after describing risks and benefits of the procedure with the patient. This includes bleeding, bruising, infection, excessive weakness, or medication side effects. A REMS form is on file and signed. Needle: 25 gauge 50mm needle electrode Number of units per muscle Pectoralis0 Biceps150 FCR0 FCU0 FDS0 FDP0 FPL0 Pronator teres 50 All injections were done after obtaining appropriate EMG activity and after negative drawback for blood. The patient tolerated the procedure well. Post procedure instructions were given. A followup appointment was made.

## 2012-08-02 NOTE — Patient Instructions (Addendum)
Botox  Injections Care After Refer to this sheet in the next few weeks. These instructions provide you with information on caring for yourself after your procedure. Your caregiver may also give you more specific instructions. Your treatment has been planned according to current medical practices, but problems sometimes occur. Call your caregiver if you have any problems or questions after your procedure. HOME CARE INSTRUCTIONS   Do not lie down for 4 hours after treatment.  Do not massage the treated muscles. This can cause the Botox to spread to the muscles around the eyes, which can lead to double vision.  Exercise the muscles every 15 minutes for 1 hour after treatment or as directed by your caregiver. Botox attaches better to active muscles.  You may put ice on the area where the shot was given.  Put ice in a plastic bag.  Place a towel between your skin and the bag. SEEK IMMEDIATE MEDICAL CARE IF:   You develop double vision.  You start to have trouble swallowing.  You develop muscle weakness in other parts of your body.  Your pupils start to become dilated and sensitive to light. MAKE SURE YOU:  Understand these instructions.  Will watch your condition.  Will get help right away if you are not doing well or get worse. Document Released: 03/16/2011 Document Revised: 01/04/2012 Document Reviewed: 03/16/2011 [facility] Patient Information 2013 [place], [place].

## 2012-08-08 ENCOUNTER — Other Ambulatory Visit (INDEPENDENT_AMBULATORY_CARE_PROVIDER_SITE_OTHER): Payer: Medicare Other

## 2012-08-08 ENCOUNTER — Encounter: Payer: Self-pay | Admitting: Vascular Surgery

## 2012-08-08 DIAGNOSIS — E119 Type 2 diabetes mellitus without complications: Secondary | ICD-10-CM | POA: Diagnosis not present

## 2012-08-08 LAB — BASIC METABOLIC PANEL
CO2: 29 mEq/L (ref 19–32)
Calcium: 8.9 mg/dL (ref 8.4–10.5)
Creatinine, Ser: 0.7 mg/dL (ref 0.4–1.5)
GFR: 127.78 mL/min (ref >60.00)
Glucose, Bld: 100 mg/dL — ABNORMAL HIGH (ref 70–99)

## 2012-08-08 LAB — HEMOGLOBIN A1C: Hgb A1c MFr Bld: 5.8 % (ref 4.6–6.5)

## 2012-08-09 ENCOUNTER — Ambulatory Visit (INDEPENDENT_AMBULATORY_CARE_PROVIDER_SITE_OTHER): Payer: Medicare Other | Admitting: Vascular Surgery

## 2012-08-09 ENCOUNTER — Encounter: Payer: Self-pay | Admitting: Vascular Surgery

## 2012-08-09 ENCOUNTER — Other Ambulatory Visit (INDEPENDENT_AMBULATORY_CARE_PROVIDER_SITE_OTHER): Payer: Medicare Other | Admitting: *Deleted

## 2012-08-09 VITALS — BP 137/72 | HR 56 | Ht 71.0 in | Wt 196.0 lb

## 2012-08-09 DIAGNOSIS — I6529 Occlusion and stenosis of unspecified carotid artery: Secondary | ICD-10-CM

## 2012-08-09 DIAGNOSIS — Z48812 Encounter for surgical aftercare following surgery on the circulatory system: Secondary | ICD-10-CM

## 2012-08-09 DIAGNOSIS — I63239 Cerebral infarction due to unspecified occlusion or stenosis of unspecified carotid arteries: Secondary | ICD-10-CM | POA: Diagnosis not present

## 2012-08-09 NOTE — Progress Notes (Signed)
Subjective:     Patient ID: Darrell [name], male   DOB: December 13, 1945, 66 y.o.   MRN: [id number]  HPI this 66 year old male returns for continued followup regarding his left carotid endarterectomy performed in February 2013. He thrombosed the carotid endarterectomy site required emergency return to the operating room with replacement using saphenous vein graft from left leg. Since that time he has had gradual improvement in his neurologic status with good speech and ability to ambulate without assistance but continues to have significant weakness in the right upper extremity. He has continued exercises at home. He takes one aspirin per day.  Past Medical History  Diagnosis Date  . Diabetes mellitus   . Sleep apnea     cpap      sleep study > 4 yrs  . Hypertension     dr todd    History  Substance Use Topics  . Smoking status: Former Smoker -- 2.5 packs/day for 30 years    Types: Cigarettes    Quit date: 12/07/2001  . Smokeless tobacco: Never Used  . Alcohol Use: Yes     socially    Family History  Problem Relation Age of Onset  . Heart disease Father     Allergies  Allergen Reactions  . Oxycodone-Acetaminophen     REACTION: feels sick (patient reports taking acetaminophen without issue)     Current outpatient prescriptions:aspirin EC 81 MG tablet, Take 81 mg by mouth daily., Disp: , Rfl: ;  BD INSULIN SYRINGE ULTRAFINE 31G X 5/16" 0.3 ML MISC, , Disp: , Rfl: ;  citalopram (CELEXA) 10 MG tablet, Take 1 tablet (10 mg total) by mouth daily., Disp: 100 tablet, Rfl: 3;  insulin glargine (LANTUS) 100 UNIT/ML injection, Inject into the skin at bedtime. Use as directed, Disp: , Rfl:  Insulin Syringes, Disposable, U-100 1 ML MISC, 1 each by Does not apply route daily., Disp: 100 each, Rfl: 3;  lisinopril (PRINIVIL) 10 MG tablet, 1 tablet daily in the morning, Disp: 100 tablet, Rfl: 3;  metFORMIN (GLUCOPHAGE) 1000 MG tablet, Take 1 tablet (1,000 mg total) by mouth 2 (two) times daily with a  meal., Disp: 200 tablet, Rfl: 3;  sennosides-docusate sodium (SENOKOT-S) 8.6-50 MG tablet, Take 1 tablet by mouth daily., Disp: , Rfl:  tizanidine (ZANAFLEX) 2 MG capsule, Take 1 capsule (2 mg total) by mouth 3 (three) times daily., Disp: 300 capsule, Rfl: 3;  DISCONTD: insulin glargine (LANTUS) 100 UNIT/ML injection, Inject 25 Units into the skin at bedtime., Disp: 10 mL, Rfl: 3  BP 137/72  Pulse 56  Ht 5\' 11"  (1.803 m)  Wt 196 lb (88.905 kg)  BMI 27.34 kg/m2  SpO2 100%  Body mass index is 27.34 kg/(m^2).          Review of Systems denies chest pain, dyspnea on exertion, PND, orthopnea, hemoptysis, amaurosis fugax, diplopia, blurred vision, syncope.    Objective:   Physical Exam blood pressure 137/72 heart rate 86 respirations 18 Gen.-alert and oriented x3 in no apparent distress HEENT normal for age Lungs no rhonchi or wheezing Cardiovascular regular rhythm no murmurs carotid pulses 3+ palpable no bruits audible Abdomen soft nontender no palpable masses Musculoskeletal free of  major deformities Skin clear -no rashes Neurologic  speech clear and fluent-right upper extremity with significant weakness-2-3/5-good strength right lower extremity with good flexibility  Lower extremities 3+ femoral and dorsalis pedis pulses palpable bilaterally with no edema  Today I ordered a carotid duplex exam which are reviewed and interpreted. The saphenous  vein graft in the left carotid endarterectomy site is widely patent with no evidence of stenosis. Right internal carotid has minimal stenosis      Assessment:     Continue slow improvement following left brain CVA-replacement left carotid artery with saphenous vein graft-residual right upper extremity weakness    Plan:     Will return to see me in 6 months for followup carotid duplex exam To see Dr. Alonza Smoker in near future Patient has not been to see Dr. Darrold Span for hypercoagulable workup which was begun while patient was in  hospital. Will leave that decision up to Dr. Tawanna Cooler and family.

## 2012-08-09 NOTE — Addendum Note (Signed)
Addended by: Melodye Ped C on: 08/09/2012 02:50 PM   Modules accepted: Orders

## 2012-08-15 ENCOUNTER — Ambulatory Visit: Payer: Medicare Other | Admitting: Family Medicine

## 2012-08-19 DIAGNOSIS — L570 Actinic keratosis: Secondary | ICD-10-CM | POA: Diagnosis not present

## 2012-08-22 ENCOUNTER — Ambulatory Visit (HOSPITAL_BASED_OUTPATIENT_CLINIC_OR_DEPARTMENT_OTHER): Payer: Medicare Other | Admitting: Physical Medicine & Rehabilitation

## 2012-08-22 ENCOUNTER — Encounter: Payer: Medicare Other | Attending: Physical Medicine & Rehabilitation

## 2012-08-22 ENCOUNTER — Encounter: Payer: Self-pay | Admitting: Physical Medicine & Rehabilitation

## 2012-08-22 VITALS — BP 120/78 | HR 74 | Resp 14 | Ht 71.0 in | Wt 197.0 lb

## 2012-08-22 DIAGNOSIS — I69959 Hemiplegia and hemiparesis following unspecified cerebrovascular disease affecting unspecified side: Secondary | ICD-10-CM | POA: Diagnosis not present

## 2012-08-22 DIAGNOSIS — G90511 Complex regional pain syndrome I of right upper limb: Secondary | ICD-10-CM

## 2012-08-22 DIAGNOSIS — G90519 Complex regional pain syndrome I of unspecified upper limb: Secondary | ICD-10-CM

## 2012-08-22 MED ORDER — PREDNISONE (PAK) 10 MG PO TABS: 10.0000 mg | ORAL_TABLET | Freq: Every day | ORAL | Status: DC

## 2012-08-22 NOTE — Progress Notes (Signed)
Subjective:    Patient ID: Darrell [name], male    DOB: April 15, 1946, 66 y.o.   MRN: [id number]  HPI Followup from Botox October 8 Pectoralis0  Biceps150  FCR0  FCU0  FDS0  FDP0  FPL0  Pronator teres 50 No adverse effects Still complains of pain at the shoulder and the wrist of the right side.  Pain Inventory Average Pain 2 Pain Right Now 2 My pain is intermittent and aching  In the last 24 hours, has pain interfered with the following? General activity 2 Relation with others 2 Enjoyment of life 2 What TIME of day is your pain at its worst? night Sleep (in general) Good  Pain is worse with: unsure Pain improves with: unsure Relief from Meds: 2  Mobility walk without assistance use a cane how many minutes can you walk? 15-60 ability to climb steps?  yes do you drive?  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  no  Physicians involved in your care Any changes since last visit?  no   Family History  Problem Relation Age of Onset  . Heart disease Father    History   Social History  . Marital Status: Married    Spouse Name: N/A    Number of Children: N/A  . Years of Education: N/A   Social History Main Topics  . Smoking status: Former Smoker -- 2.5 packs/day for 30 years    Types: Cigarettes    Quit date: 12/07/2001  . Smokeless tobacco: Never Used  . Alcohol Use: Yes     socially  . Drug Use: No  . Sexually Active: None   Other Topics Concern  . None   Social History Narrative  . None   Past Surgical History  Procedure Date  . Hernia repair   . Heel spur surgery   . Heer surgery   . Endarterectomy 12/09/2011    Procedure: ENDARTERECTOMY  CAROTID;  Surgeon: [name], MD;  Location: [facility] OR;  Service: Vascular;  Laterality: Left;  Left Carotid endarterectomy with dacron patch angioplasty  . Carotid endarterectomy 12/09/11    Left   Past Medical History  Diagnosis Date  . Diabetes mellitus   . Sleep apnea     cpap      sleep study > 4 yrs  . Hypertension     dr todd   BP 120/78  Pulse 74  Resp 14  Ht 5\' 11"  (1.803 m)  Wt 197 lb (89.359 kg)  BMI 27.48 kg/m2  SpO2 96%   Review of Systems  Neurological: Positive for weakness.       Tingling  Psychiatric/Behavioral: Positive for confusion.  All other systems reviewed and are negative.       Objective:   Physical Exam  Pain with finger and hand passive range of motion. Active range of motion is severely limited to minus strength in the finger and wrist flexors and extensors. Right elbow strength is 3 minus Biceps tone is rated as 2 on the Ashworth scale Mild swelling and shininess of skin in the right hand      Assessment & Plan:  1. 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 I cautioned that this may increase his blood sugars. These have been very well-controlled lately. He has a primary care follow up appointment in 3 days. If his blood sugar goes above 300 I have told him to stop the prednisone

## 2012-08-22 NOTE — Patient Instructions (Signed)
Continue range of motion stretching exercises Monitor blood sugars while on prednisone. If blood sugars are greater than 300  discontinue

## 2012-08-23 ENCOUNTER — Telehealth: Payer: Self-pay | Admitting: Physical Medicine & Rehabilitation

## 2012-08-23 NOTE — Telephone Encounter (Signed)
Patient was only given 30 tablets not 76 of prednisone.

## 2012-08-23 NOTE — Telephone Encounter (Signed)
Clarify Prednisone Rx

## 2012-08-25 ENCOUNTER — Ambulatory Visit (INDEPENDENT_AMBULATORY_CARE_PROVIDER_SITE_OTHER): Payer: Medicare Other | Admitting: Family Medicine

## 2012-08-25 ENCOUNTER — Encounter: Payer: Self-pay | Admitting: Family Medicine

## 2012-08-25 VITALS — BP 130/70 | Temp 98.0°F | Wt 198.0 lb

## 2012-08-25 DIAGNOSIS — N400 Enlarged prostate without lower urinary tract symptoms: Secondary | ICD-10-CM

## 2012-08-25 DIAGNOSIS — E119 Type 2 diabetes mellitus without complications: Secondary | ICD-10-CM | POA: Diagnosis not present

## 2012-08-25 DIAGNOSIS — Z23 Encounter for immunization: Secondary | ICD-10-CM | POA: Diagnosis not present

## 2012-08-25 NOTE — Patient Instructions (Addendum)
Continue your current medications  If you experience episodes of hypoglycemia begin to decrease the metformin as outlined  Return in 3 months for complete physical examination  Nonfasting labs one week prior  I would recommend a followup visit with hematology

## 2012-08-25 NOTE — Progress Notes (Signed)
  Subjective:    Patient ID: Darrell [name], male    DOB: 1946/05/30, 66 y.o.   MRN: [id number]  HPI Darrell [name] is a 66 year old married male nonsmoker who comes in today for followup of diabetes  He's done exceptionally well with his medication. His hemoglobin A1c is normal and his fasting blood sugar is 100. This is consistent with blood sugars he is  getting at home. No hypoglycemia   Review of Systems      Review of systems negative except for toenails Objective:   Physical Exam Well-developed well-nourished male in no acute distress examination of feet shows fungal infection all 10 toenails       Assessment & Plan:  Diabetes under good control continue current therapy  Fungal infection toenails advised to soak and file weekly  Return CPX

## 2012-09-15 ENCOUNTER — Telehealth: Payer: Self-pay | Admitting: *Deleted

## 2012-09-15 NOTE — Telephone Encounter (Signed)
Wife called reporting husband had stroke in February and Dr. Tawanna Cooler suggested she call Dr. Truett Perna to be checked for "blood condition" that could have contributed to this. Asking to be called with appointment.

## 2012-09-15 NOTE — Telephone Encounter (Signed)
hypercoagulable state

## 2012-09-19 ENCOUNTER — Ambulatory Visit (HOSPITAL_BASED_OUTPATIENT_CLINIC_OR_DEPARTMENT_OTHER): Payer: Medicare Other | Admitting: Physical Medicine & Rehabilitation

## 2012-09-19 ENCOUNTER — Encounter: Payer: Self-pay | Admitting: Physical Medicine & Rehabilitation

## 2012-09-19 ENCOUNTER — Encounter: Payer: Medicare Other | Attending: Physical Medicine & Rehabilitation

## 2012-09-19 VITALS — BP 125/82 | HR 67 | Resp 14 | Ht 71.0 in | Wt 201.0 lb

## 2012-09-19 DIAGNOSIS — M7500 Adhesive capsulitis of unspecified shoulder: Secondary | ICD-10-CM | POA: Diagnosis not present

## 2012-09-19 DIAGNOSIS — I69959 Hemiplegia and hemiparesis following unspecified cerebrovascular disease affecting unspecified side: Secondary | ICD-10-CM | POA: Diagnosis not present

## 2012-09-19 DIAGNOSIS — G8111 Spastic hemiplegia affecting right dominant side: Secondary | ICD-10-CM

## 2012-09-19 DIAGNOSIS — G8110 Spastic hemiplegia affecting unspecified side: Secondary | ICD-10-CM

## 2012-09-19 DIAGNOSIS — M7501 Adhesive capsulitis of right shoulder: Secondary | ICD-10-CM

## 2012-09-19 NOTE — Patient Instructions (Signed)
Repeat injection 2 weeks right shoulder Adhesive Capsulitis Sometimes the shoulder becomes stiff and is painful to move. Some people say it feels as if the shoulder is frozen in place. Because of this, the condition is called "frozen shoulder." Its medical name is adhesive capsulitis.  The shoulder joint is made up of strong connective tissue that attaches the ball of the humerus to the shallow shoulder socket. This strong connective tissue is called the joint capsule. This tissue can become stiff and swollen. That is when adhesive capsulitis sets in.

## 2012-09-19 NOTE — Progress Notes (Signed)
Subjective:    Patient ID: Darrell [name], male    DOB: [birth year], 66 y.o.   MRN: [id number]  HPI  Pain Inventory Average Pain 0 Pain Right Now 0 My pain is no poain  In the last 24 hours, has pain interfered with the following? General activity 0 Relation with others 0 Enjoyment of life 2 What TIME of day is your pain at its worst? night Sleep (in general) Good  Pain is worse with: unsure Pain improves with: unsure Relief from Meds: 0  Mobility walk without assistance use a cane how many minutes can you walk? 45 ability to climb steps?  yes do you drive?  no  Function employed # of hrs/week varies I need assistance with the following:  dressing, bathing, meal prep, household duties and shopping  Neuro/Psych trouble walking confusion  Prior Studies Any changes since last visit?  no  Physicians involved in your care Any changes since last visit?  no   Family History  Problem Relation Age of Onset  . Heart disease Father    History   Social History  . Marital Status: Married    Spouse Name: N/A    Number of Children: N/A  . Years of Education: N/A   Social History Main Topics  . Smoking status: Former Smoker -- 2.5 packs/day for 30 years    Types: Cigarettes    Quit date: 12/07/2001  . Smokeless tobacco: Never Used  . Alcohol Use: Yes     Comment: socially  . Drug Use: No  . Sexually Active: None   Other Topics Concern  . None   Social History Narrative  . None   Past Surgical History  Procedure Date  . Hernia repair   . Heel spur surgery   . Heer surgery   . Endarterectomy 12/09/2011    Procedure: ENDARTERECTOMY CAROTID;  Surgeon: [name], MD;  Location: [facility] OR;  Service: Vascular;  Laterality: Left;  Left Carotid endarterectomy with dacron patch angioplasty  . Carotid endarterectomy 12/09/11    Left   Past Medical History  Diagnosis Date  . Diabetes mellitus   . Sleep apnea     cpap      sleep study > 4 yrs  . Hypertension       dr todd   BP 125/82  Pulse 67  Resp 14  Ht 5\' 11"  (1.803 m)  Wt 201 lb (91.173 kg)  BMI 28.03 kg/m2  SpO2 96%    Review of Systems  Musculoskeletal: Positive for gait problem.  Psychiatric/Behavioral: Positive for confusion.  All other systems reviewed and are negative.       Objective:   Physical Exam Flexion contracture 20 at MCP 4 and 5 on the right hand. 20 PIP at digits 45 and 3 on the right hand Negative pain with MCP compression No dorsum of the hand swelling Ashworth grade 2 spasticity right elbow Gait no evidence of toe drag or knee instability right arm stays at side Right shoulder pain with external rotation abduction forward flexion with limited range of motion       Assessment & Plan:  1. Adhesive capsulitis right shoulder-Recommend series of 3 corticosteroid injections  2. Right shoulder hand syndrome resolved  3. Right biceps and pronator spasticity improved after Botox injection  Right shoulder corticosteroid injection intra-articular Indication adhesive capsulitis injection #1/3 Informed consent was obtained after describing risks and benefits of the procedure with the patient his include bleeding bruising and infection he elects to  proceed and has given written consent patient placed in a seated position area marked and prepped with Betadine alcohol posterior lateral approach. 25-gauge needle inserted to have been slightly withdrawn 1 cc of 40 mg Depo-Medrol and 4 cc of lidocaine injected. Patient tolerated procedure well. Post procedure instructions given Repeat in 2wk

## 2012-10-03 ENCOUNTER — Encounter: Payer: Self-pay | Admitting: Physical Medicine & Rehabilitation

## 2012-10-03 ENCOUNTER — Ambulatory Visit (HOSPITAL_BASED_OUTPATIENT_CLINIC_OR_DEPARTMENT_OTHER): Payer: Medicare Other | Admitting: Physical Medicine & Rehabilitation

## 2012-10-03 ENCOUNTER — Encounter: Payer: Medicare Other | Attending: Physical Medicine & Rehabilitation

## 2012-10-03 VITALS — BP 138/66 | HR 63 | Resp 14 | Ht 71.0 in | Wt 201.0 lb

## 2012-10-03 DIAGNOSIS — M7501 Adhesive capsulitis of right shoulder: Secondary | ICD-10-CM

## 2012-10-03 DIAGNOSIS — I69959 Hemiplegia and hemiparesis following unspecified cerebrovascular disease affecting unspecified side: Secondary | ICD-10-CM | POA: Insufficient documentation

## 2012-10-03 DIAGNOSIS — M7500 Adhesive capsulitis of unspecified shoulder: Secondary | ICD-10-CM

## 2012-10-03 NOTE — Progress Notes (Signed)
Right shoulder corticosteroid injection intra-articular Indication adhesive capsulitis injection #1/3 Informed consent was obtained after describing risks and benefits of the procedure with the patient his include bleeding bruising and infection he elects to proceed and has given written consent patient placed in a seated position area marked and prepped with Betadine alcohol posterior lateral approach. 25-gauge needle inserted to have been slightly withdrawn 1 cc of 40 mg Depo-Medrol and 4 cc of lidocaine injected. Patient tolerated procedure well. Post procedure instructions given Repeat in 2wk

## 2012-10-03 NOTE — Patient Instructions (Signed)
Return for injection #3 in 2 weeks, and if your shoulders not feeling better will need to do it under ultrasound guidance

## 2012-10-03 NOTE — Progress Notes (Signed)
  Subjective:    Patient ID: Darrell [name], male    DOB: June 10, 1946, 66 y.o.   MRN: [id number]  HPI  Pain Inventory Average Pain 0 Pain Right Now 0 My pain is n/a  In the last 24 hours, has pain interfered with the following? General activity 2 Relation with others 0 Enjoyment of life 2 What TIME of day is your pain at its worst? all the time Sleep (in general) Good  Pain is worse with: unsure Pain improves with: rest, heat/ice, therapy/exercise, pacing activities, medication, TENS and injections Relief from Meds: 0  Mobility use a cane how many minutes can you walk? 45-60 ability to climb steps?  yes do you drive?  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  no  Physicians involved in your care Any changes since last visit?  no   Family History  Problem Relation Age of Onset  . Heart disease Father    History   Social History  . Marital Status: Married    Spouse Name: N/A    Number of Children: N/A  . Years of Education: N/A   Social History Main Topics  . Smoking status: Former Smoker -- 2.5 packs/day for 30 years    Types: Cigarettes    Quit date: 12/07/2001  . Smokeless tobacco: Never Used  . Alcohol Use: Yes     Comment: socially  . Drug Use: No  . Sexually Active: None   Other Topics Concern  . None   Social History Narrative  . None   Past Surgical History  Procedure Date  . Hernia repair   . Heel spur surgery   . Heer surgery   . Endarterectomy 12/09/2011    Procedure: ENDARTERECTOMY CAROTID;  Surgeon: [name], MD;  Location: [facility] OR;  Service: Vascular;  Laterality: Left;  Left Carotid endarterectomy with dacron patch angioplasty  . Carotid endarterectomy 12/09/11    Left   Past Medical History   Diagnosis Date  . Diabetes mellitus   . Sleep apnea     cpap      sleep study > 4 yrs  . Hypertension     dr todd   BP 138/66  Pulse 63  Resp 14  Ht 5\' 11"  (1.803 m)  Wt 201 lb (91.173 kg)  BMI 28.03 kg/m2  SpO2 96%     Review of Systems  Musculoskeletal: Positive for gait problem.  Neurological: Positive for weakness.  Psychiatric/Behavioral: Positive for confusion.  All other systems reviewed and are negative.       Objective:   Physical Exam        Assessment & Plan:

## 2012-10-05 DIAGNOSIS — I6350 Cerebral infarction due to unspecified occlusion or stenosis of unspecified cerebral artery: Secondary | ICD-10-CM | POA: Diagnosis not present

## 2012-11-01 ENCOUNTER — Ambulatory Visit: Payer: Medicare Other | Admitting: Physical Medicine & Rehabilitation

## 2012-11-18 ENCOUNTER — Encounter: Payer: Medicare Other | Attending: Physical Medicine & Rehabilitation

## 2012-11-18 ENCOUNTER — Ambulatory Visit (HOSPITAL_BASED_OUTPATIENT_CLINIC_OR_DEPARTMENT_OTHER): Payer: Medicare Other | Admitting: Physical Medicine & Rehabilitation

## 2012-11-18 ENCOUNTER — Encounter: Payer: Self-pay | Admitting: Physical Medicine & Rehabilitation

## 2012-11-18 VITALS — BP 116/75 | HR 76 | Resp 14 | Ht 71.0 in | Wt 200.0 lb

## 2012-11-18 DIAGNOSIS — I69959 Hemiplegia and hemiparesis following unspecified cerebrovascular disease affecting unspecified side: Secondary | ICD-10-CM | POA: Insufficient documentation

## 2012-11-18 DIAGNOSIS — M7500 Adhesive capsulitis of unspecified shoulder: Secondary | ICD-10-CM

## 2012-11-18 DIAGNOSIS — M7501 Adhesive capsulitis of right shoulder: Secondary | ICD-10-CM

## 2012-11-18 NOTE — Patient Instructions (Signed)
We will do one more shoulder injection in about 2 weeks that will be the third

## 2012-11-18 NOTE — Progress Notes (Signed)
Shoulder injection Right Under ultrasound guidance  Indication:Right Shoulder adhesive capsulitis pain not relieved by medication management and other conservative care.  Informed consent was obtained after describing risks and benefits of the procedure with the patient, this includes bleeding, bruising, infection and medication side effects. The patient wishes to proceed and has given written consent. Patient was placed in a seated position. TheRight postero lateralshoulder was marked and prepped with betadine in the subacromial area. A 25-gauge 1-1/2 inch needle was inserted into the subacromial area. After negative draw back for blood, a solution containing 1 mL of 40 mg per ML depomedrol and 3 mL of 1% lidocaine was injected. A band aid was applied. The patient tolerated the procedure well. Post procedure instructions were given.

## 2012-11-24 DIAGNOSIS — L570 Actinic keratosis: Secondary | ICD-10-CM | POA: Diagnosis not present

## 2012-11-29 ENCOUNTER — Other Ambulatory Visit (INDEPENDENT_AMBULATORY_CARE_PROVIDER_SITE_OTHER): Payer: Medicare Other

## 2012-11-29 DIAGNOSIS — N400 Enlarged prostate without lower urinary tract symptoms: Secondary | ICD-10-CM

## 2012-11-29 DIAGNOSIS — E119 Type 2 diabetes mellitus without complications: Secondary | ICD-10-CM

## 2012-11-29 LAB — CBC WITH DIFFERENTIAL/PLATELET
Basophils Relative: 0.3 % (ref 0.0–3.0)
Hemoglobin: 14.3 g/dL (ref 13.0–17.0)
Lymphocytes Relative: 21 % (ref 12.0–46.0)
Monocytes Relative: 9.1 % (ref 3.0–12.0)
Neutro Abs: 4.6 10*3/uL (ref 1.4–7.7)
RBC: 4.92 Mil/uL (ref 4.22–5.81)
WBC: 6.9 10*3/uL (ref 4.5–10.5)

## 2012-11-29 LAB — MICROALBUMIN / CREATININE URINE RATIO
Creatinine,U: 92.4 mg/dL
Microalb, Ur: 2.9 mg/dL — ABNORMAL HIGH (ref 0.0–1.9)

## 2012-11-29 LAB — BASIC METABOLIC PANEL
BUN: 17 mg/dL (ref 6–23)
Creatinine, Ser: 0.7 mg/dL (ref 0.4–1.5)
GFR: 117.34 mL/min (ref >60.00)

## 2012-11-29 LAB — POCT URINALYSIS DIPSTICK
Bilirubin, UA: NEGATIVE
Ketones, UA: NEGATIVE
pH, UA: 5.5

## 2012-11-29 LAB — LIPID PANEL
HDL: 29.7 mg/dL — ABNORMAL LOW (ref >39.00)
LDL Cholesterol: 87 mg/dL (ref 0–99)
Total CHOL/HDL Ratio: 5
Triglycerides: 109 mg/dL (ref 0.0–149.0)

## 2012-11-29 LAB — HEPATIC FUNCTION PANEL: Albumin: 3.8 g/dL (ref 3.5–5.2)

## 2012-11-29 LAB — PSA: PSA: 0.61 ng/mL (ref 0.10–4.00)

## 2012-11-29 LAB — HEMOGLOBIN A1C: Hgb A1c MFr Bld: 5.9 % (ref 4.6–6.5)

## 2012-12-06 ENCOUNTER — Ambulatory Visit (INDEPENDENT_AMBULATORY_CARE_PROVIDER_SITE_OTHER): Payer: Medicare Other | Admitting: Family Medicine

## 2012-12-06 ENCOUNTER — Encounter: Payer: Self-pay | Admitting: Family Medicine

## 2012-12-06 ENCOUNTER — Encounter: Payer: Self-pay | Admitting: Physical Medicine & Rehabilitation

## 2012-12-06 ENCOUNTER — Ambulatory Visit (HOSPITAL_BASED_OUTPATIENT_CLINIC_OR_DEPARTMENT_OTHER): Payer: Medicare Other | Admitting: Physical Medicine & Rehabilitation

## 2012-12-06 ENCOUNTER — Encounter: Payer: Medicare Other | Attending: Physical Medicine & Rehabilitation

## 2012-12-06 VITALS — BP 142/90 | HR 74 | Resp 14 | Ht 70.0 in | Wt 201.4 lb

## 2012-12-06 VITALS — BP 110/80 | Temp 99.1°F | Ht 70.0 in | Wt 199.0 lb

## 2012-12-06 DIAGNOSIS — M7500 Adhesive capsulitis of unspecified shoulder: Secondary | ICD-10-CM | POA: Diagnosis not present

## 2012-12-06 DIAGNOSIS — E1149 Type 2 diabetes mellitus with other diabetic neurological complication: Secondary | ICD-10-CM

## 2012-12-06 DIAGNOSIS — I69959 Hemiplegia and hemiparesis following unspecified cerebrovascular disease affecting unspecified side: Secondary | ICD-10-CM | POA: Insufficient documentation

## 2012-12-06 DIAGNOSIS — E785 Hyperlipidemia, unspecified: Secondary | ICD-10-CM | POA: Diagnosis not present

## 2012-12-06 DIAGNOSIS — I639 Cerebral infarction, unspecified: Secondary | ICD-10-CM

## 2012-12-06 DIAGNOSIS — M7501 Adhesive capsulitis of right shoulder: Secondary | ICD-10-CM

## 2012-12-06 DIAGNOSIS — I10 Essential (primary) hypertension: Secondary | ICD-10-CM | POA: Diagnosis not present

## 2012-12-06 DIAGNOSIS — E119 Type 2 diabetes mellitus without complications: Secondary | ICD-10-CM

## 2012-12-06 DIAGNOSIS — Z23 Encounter for immunization: Secondary | ICD-10-CM | POA: Diagnosis not present

## 2012-12-06 DIAGNOSIS — Z0000 Encounter for general adult medical examination without abnormal findings: Secondary | ICD-10-CM | POA: Diagnosis not present

## 2012-12-06 DIAGNOSIS — G8191 Hemiplegia, unspecified affecting right dominant side: Secondary | ICD-10-CM

## 2012-12-06 DIAGNOSIS — G8190 Hemiplegia, unspecified affecting unspecified side: Secondary | ICD-10-CM

## 2012-12-06 DIAGNOSIS — I6350 Cerebral infarction due to unspecified occlusion or stenosis of unspecified cerebral artery: Secondary | ICD-10-CM

## 2012-12-06 MED ORDER — TIZANIDINE HCL 2 MG PO CAPS: 2.0000 mg | ORAL_CAPSULE | Freq: Three times a day (TID) | ORAL | Status: DC

## 2012-12-06 MED ORDER — CITALOPRAM HYDROBROMIDE 10 MG PO TABS: 10.0000 mg | ORAL_TABLET | Freq: Every day | ORAL | Status: DC

## 2012-12-06 MED ORDER — LISINOPRIL 10 MG PO TABS: ORAL_TABLET | ORAL | Status: DC

## 2012-12-06 MED ORDER — METFORMIN HCL 1000 MG PO TABS: 1000.0000 mg | ORAL_TABLET | Freq: Two times a day (BID) | ORAL | Status: DC

## 2012-12-06 NOTE — Progress Notes (Signed)
Shoulder injection Right Under ultrasound guidance  Indication:Right Shoulder adhesive capsulitis pain not relieved by medication management and other conservative care.  Informed consent was obtained after describing risks and benefits of the procedure with the patient, this includes bleeding, bruising, infection and medication side effects. The patient wishes to proceed and has given written consent. Patient was placed in a Left lateral decubitus position. The Right postero lateralshoulder was marked and prepped with betadine in the subacromial area. 50 mm echo block needle 22-gauge. After negative draw back for blood, a solution containing 1 mL of 40 mg per ML depomedrol and 4 mL of 1% lidocaine was injected. A band aid was applied. The patient tolerated the procedure well. Post procedure instructions were given.

## 2012-12-06 NOTE — Patient Instructions (Signed)
This was your third injection. Next visit is 6 weeks no injection

## 2012-12-06 NOTE — Progress Notes (Signed)
Subjective:    Patient ID: Darrell [name]., male    DOB: [birth year], 67 y.o.   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  He has done an easily well and we have been able to decrease the number of his medications. His weight is stable at 199. Blood sugar has been dropping 2 or 3 times a week below 90. His hemoglobin A1c was 5.9. He's only taking 5 units of insulin day along with the metformin 1000 mg twice a day. We therefore stopped the insulin.  He's due to go back next month and see the neurologist the question now is can he drive.  Cognitive function normal he walks with a brace on his leg home health safety reviewed no issues identified, no guns in the house, he does have a health care power of attorney and living well. His exercise is limited because of his stroke but he's doing very well and everything that he can do.  He gets routine eye care, dental care, colonoscopy, vaccinations up-to-date.   Review of Systems  Constitutional: Negative.   HENT: Negative.   Eyes: Negative.   Respiratory: Negative.   Cardiovascular: Negative.   Gastrointestinal: Negative.   Genitourinary: Negative.   Musculoskeletal: Negative.   Skin: Negative.   Neurological: Negative.   Psychiatric/Behavioral: Negative.        Objective:   Physical Exam  Constitutional: He is oriented to person, place, and time. He appears well-developed and well-nourished.  HENT:  Head: Normocephalic and atraumatic.  Right Ear: External ear normal.  Left Ear: External ear normal.  Nose: Nose normal.  Mouth/Throat: Oropharynx is clear and moist.  Eyes: Conjunctivae and EOM are normal. Pupils are equal, round, and reactive to light.  Neck: Normal range of motion. Neck supple.  No JVD present. No tracheal deviation present. No thyromegaly present.  Cardiovascular: Normal rate, regular rhythm, normal heart sounds and intact distal pulses.  Exam reveals no gallop and no friction rub.   No murmur heard. Pulmonary/Chest: Effort normal and breath sounds normal. No stridor. No respiratory distress. He has no wheezes. He has no rales. He exhibits no tenderness.  Abdominal: Soft. Bowel sounds are normal. He exhibits no distension and no mass. There is no tenderness. There is no rebound and no guarding.  Genitourinary: Penis normal.  Musculoskeletal: Normal range of motion. He exhibits no edema and no tenderness.  Lymphadenopathy:    He has no cervical adenopathy.  Neurological: He is alert and oriented to person, place, and time. He has normal reflexes. No cranial nerve deficit. He exhibits normal muscle tone.  Left arm and leg normal.... Right evidence of persistent neurologic deficit with about 10-15% function marked limitation range of motion right shoulder  Skin: Skin is warm and dry. No rash noted. No erythema. No pallor.  Psychiatric: He has a normal mood and affect. His behavior is normal. Judgment and thought content normal.   Total body skin exam normal except for 2 black lesions on his back return next week for removal       Assessment & Plan:  Hypertension at goal continue current medication  Diabetes blood sugar too low stop insulin,,,,,,,,,,,,,,, continue metformin  Status post right hemiparesis following carotid endarterectomy about a year ago continue Zanaflex 3 times a day neurologic followup  next month  Mild depression continue Celexa  2 abnormal moles on the back return next week for removal

## 2012-12-06 NOTE — Patient Instructions (Addendum)
Continue your current medications except stop the insulin  Check a fasting blood sugar daily in the morning  If after a month your blood sugars are still low and/or you're having episodes of hypoglycemia,,,,,,,, decrease the evening metformin dose to 500 mg  Followup A1c and office visit in 6 months  Return next week to remove the 2 abnormal appearing moles on your back

## 2012-12-20 ENCOUNTER — Ambulatory Visit: Payer: Medicare Other | Admitting: Family Medicine

## 2012-12-26 ENCOUNTER — Ambulatory Visit: Payer: Medicare Other | Admitting: Family Medicine

## 2013-01-03 DIAGNOSIS — G8110 Spastic hemiplegia affecting unspecified side: Secondary | ICD-10-CM | POA: Diagnosis not present

## 2013-01-03 DIAGNOSIS — I69920 Aphasia following unspecified cerebrovascular disease: Secondary | ICD-10-CM | POA: Diagnosis not present

## 2013-01-03 DIAGNOSIS — I63239 Cerebral infarction due to unspecified occlusion or stenosis of unspecified carotid arteries: Secondary | ICD-10-CM | POA: Diagnosis not present

## 2013-01-03 DIAGNOSIS — I6350 Cerebral infarction due to unspecified occlusion or stenosis of unspecified cerebral artery: Secondary | ICD-10-CM | POA: Diagnosis not present

## 2013-01-17 ENCOUNTER — Encounter: Payer: Self-pay | Admitting: Physical Medicine & Rehabilitation

## 2013-01-17 ENCOUNTER — Ambulatory Visit (HOSPITAL_BASED_OUTPATIENT_CLINIC_OR_DEPARTMENT_OTHER): Payer: Medicare Other | Admitting: Physical Medicine & Rehabilitation

## 2013-01-17 ENCOUNTER — Encounter: Payer: Medicare Other | Attending: Physical Medicine & Rehabilitation

## 2013-01-17 VITALS — BP 129/88 | HR 72 | Resp 14 | Ht 70.0 in | Wt 202.0 lb

## 2013-01-17 DIAGNOSIS — I69959 Hemiplegia and hemiparesis following unspecified cerebrovascular disease affecting unspecified side: Secondary | ICD-10-CM | POA: Diagnosis not present

## 2013-01-17 DIAGNOSIS — G8110 Spastic hemiplegia affecting unspecified side: Secondary | ICD-10-CM | POA: Diagnosis not present

## 2013-01-17 DIAGNOSIS — M7500 Adhesive capsulitis of unspecified shoulder: Secondary | ICD-10-CM

## 2013-01-17 DIAGNOSIS — G8111 Spastic hemiplegia affecting right dominant side: Secondary | ICD-10-CM

## 2013-01-17 DIAGNOSIS — M7501 Adhesive capsulitis of right shoulder: Secondary | ICD-10-CM

## 2013-01-17 NOTE — Progress Notes (Signed)
Subjective:    Patient ID: Darrell Alar., male    DOB: 04-Dec-1945, 67 y.o.   MRN: [id number] 67 year old male  with history of diabetes mellitus, hypertension, severe left carotid  artery stenosis admitted December 09, 2011 for left CA by Dr. Hart Rochester, in  recovery. The patient with decreased level of consciousness and  inability to move right side. He was taken back to OR emergently for  exploration of left CA with thrombectomy of endarterectomized surface And distal ICA. Neurology and Heme/Onc was consulted for input. 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 No significant stenosis. Hypercoag  studies were ordered and the patient was noted to have positive lupus  anticoagulant, but not reliable due to proximity of treatment with  heparin.  HPI Right shoulder overall doing better still with limited range of motion. No significant pain. No falls. Pain Inventory Average Pain 0 Pain Right Now 0 My pain is intermittent  In the last 24 hours, has pain interfered with the following? General activity 0 Relation with others 0 Enjoyment of life 0 What TIME of day is your pain at its worst? n/a Sleep (in general) Good  Pain is worse with: some activites Pain improves with: pacing activities Relief from Meds: n/a  Mobility use a cane how many minutes can you walk? 45-60 ability to climb steps?  yes do you drive?  no  Function employed # of hrs/week 2-5 retired I need assistance with the following:  dressing, bathing, meal prep, household duties and shopping  Neuro/Psych No problems in this area  Prior Studies Any changes since last visit?  no  Physicians involved in your care Any changes since last visit?  no   Family History  Problem Relation Age of Onset  . Heart disease Father    History   Social History  . Marital Status: Married    Spouse Name: N/A    Number of Children:  N/A  . Years of Education: N/A   Social History Main Topics  . Smoking status: Former Smoker -- 2.50 packs/day for 30 years    Types: Cigarettes    Quit date: 12/07/2001  . Smokeless tobacco: Never Used  . Alcohol Use: Yes     Comment: socially  . Drug Use: No  . Sexually Active: None   Other Topics Concern  . None   Social History Narrative  . None   Past Surgical History  Procedure Laterality Date  . Hernia repair    . Heel spur surgery    . Heer surgery    . Endarterectomy  12/09/2011    Procedure: ENDARTERECTOMY CAROTID;  Surgeon: [name], MD;  Location: [facility] OR;  Service: Vascular;  Laterality: Left;  Left Carotid endarterectomy with dacron patch angioplasty  . Carotid endarterectomy  12/09/11    Left   Past Medical History  Diagnosis Date  . Diabetes mellitus   . Sleep apnea     cpap      sleep study > 4 yrs  . Hypertension     dr todd   BP 129/88  Pulse 72  Resp 14  Ht 5\' 10"  (1.778 m)  Wt 202 lb (91.627 kg)  BMI 28.98 kg/m2  SpO2 97%     Review of Systems  All other systems reviewed and are negative.       Objective:   Physical Exam Right shoulder abduction to 90. Right hand strength 3  minus grip 3 minus deltoid 3/5 biceps and triceps. Decreased coordination right hand. Right lower extremity 4 minus in the hip flexors knee extensors ankle dorsiflexors and plantar flexors. Ambulation is without assistive device widened base of support. 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 Overall improved. Any supervision to modified independent level with mobility and ADLs. Still not able to drive or perform higher level cognitive tasks 2. 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 Picture taken. Monitor for recurrence of pain. Followup 3 months. Sooner if pain reoccurs

## 2013-02-01 ENCOUNTER — Ambulatory Visit: Payer: Medicare Other | Admitting: Physical Medicine and Rehabilitation

## 2013-02-06 ENCOUNTER — Encounter: Payer: Self-pay | Admitting: Vascular Surgery

## 2013-02-07 ENCOUNTER — Ambulatory Visit (INDEPENDENT_AMBULATORY_CARE_PROVIDER_SITE_OTHER): Payer: Medicare Other | Admitting: Vascular Surgery

## 2013-02-07 ENCOUNTER — Other Ambulatory Visit (INDEPENDENT_AMBULATORY_CARE_PROVIDER_SITE_OTHER): Payer: Medicare Other | Admitting: *Deleted

## 2013-02-07 ENCOUNTER — Encounter: Payer: Self-pay | Admitting: Vascular Surgery

## 2013-02-07 VITALS — BP 108/68 | HR 57 | Ht 70.0 in | Wt 201.0 lb

## 2013-02-07 DIAGNOSIS — Z48812 Encounter for surgical aftercare following surgery on the circulatory system: Secondary | ICD-10-CM | POA: Diagnosis not present

## 2013-02-07 DIAGNOSIS — I63239 Cerebral infarction due to unspecified occlusion or stenosis of unspecified carotid arteries: Secondary | ICD-10-CM

## 2013-02-07 DIAGNOSIS — I6529 Occlusion and stenosis of unspecified carotid artery: Secondary | ICD-10-CM

## 2013-02-07 NOTE — Progress Notes (Signed)
Subjective:     Patient ID: Darrell [name]., male   DOB: 1946-03-24, 67 y.o.   MRN: [id number]  HPI this 67 year old male returns for continued followup regarding his carotid occlusive disease. He underwent left carotid endarterectomy in February of 2013   which acutely thrombosed a few hours postoperatively and required return to the OR with insertion of a common to internal carotid saphenous vein graft. He suffered a left brain CVA. He has had significant improvement in his neurologic status over the past 14 months. His speech has been clear as his but his thought process. His right leg has had good function enabling him to walk fairly long distances. He does have significant disability with his right upper extremity. He has had no new neurologic symptoms including aphasia, amaurosis fugax, left-sided lateralizing weakness, diplopia, or syncope.  Past Medical History  Diagnosis Date  . Diabetes mellitus   . Sleep apnea     cpap      sleep study > 4 yrs  . Hypertension     dr todd    History  Substance Use Topics  . Smoking status: Former Smoker -- 2.50 packs/day for 30 years    Types: Cigarettes    Quit date: 12/07/2001  . Smokeless tobacco: Never Used  . Alcohol Use: Yes     Comment: socially    Family History  Problem Relation Age of Onset  . Heart disease Father     Allergies  Allergen Reactions  . Oxycodone-Acetaminophen     REACTION: feels sick (patient reports taking acetaminophen without issue)     Current outpatient prescriptions:aspirin EC 81 MG tablet, Take 81 mg by mouth daily., Disp: , Rfl: ;  citalopram (CELEXA) 10 MG tablet, Take 1 tablet (10 mg total) by mouth daily., Disp: 100 tablet, Rfl: 3;  lisinopril (PRINIVIL) 10 MG tablet, 1 tablet daily in the morning, Disp: 100 tablet, Rfl: 3;  metFORMIN (GLUCOPHAGE) 1000 MG tablet, Take 1 tablet (1,000 mg total) by mouth 2 (two) times daily with a meal., Disp: 200 tablet, Rfl: 3 tizanidine (ZANAFLEX) 2 MG capsule,  Take 1 capsule (2 mg total) by mouth 3 (three) times daily., Disp: 300 capsule, Rfl: 3  BP 108/68  Pulse 57  Ht 5\' 10"  (1.778 m)  Wt 201 lb (91.173 kg)  BMI 28.84 kg/m2  SpO2 100%  Body mass index is 28.84 kg/(m^2).           Review of Systems denies chest pain, dyspnea on exertion, PND, orthopnea, hemoptysis    Objective:   Physical Exam blood pressure 108/68 heart rate 57 respirations 16 Gen.-alert and oriented x3 in no apparent distress HEENT normal for age Lungs no rhonchi or wheezing Cardiovascular regular rhythm no murmurs carotid pulses 3+ palpable no bruits audible Abdomen soft nontender no palpable masses Musculoskeletal free of  major deformities Skin clear -no rashes Neurologic right upper extremity spastic paralysis with 2/5 strength. Good strength right lower extremity-4/5 with full range of motion. Speech clear. Lower extremities 3+ femoral and dorsalis pedis pulses palpable bilaterally with no edema  Today I ordered a carotid duplex exam which I reviewed and interpreted. There is no stenosis in the saphenous vein graft from the left common carotid to the internal carotid. There is minimal narrowing of the right internal carotid artery less than 40%       Assessment:     Significant progress from left brain CVA which occurred February 2013 following left carotid endarterectomy requiring insertion of left  common to internal carotid saphenous vein graft    Plan:     Return in one year for followup carotid duplex exam from last few symptoms occur Continue aspirin 81 mg per day

## 2013-02-07 NOTE — Addendum Note (Signed)
Addended by: Dannielle Karvonen on: 02/07/2013 03:51 PM   Modules accepted: Orders

## 2013-02-14 ENCOUNTER — Ambulatory Visit (INDEPENDENT_AMBULATORY_CARE_PROVIDER_SITE_OTHER): Payer: Medicare Other | Admitting: Family Medicine

## 2013-02-14 ENCOUNTER — Encounter: Payer: Self-pay | Admitting: Family Medicine

## 2013-02-14 VITALS — BP 116/80 | HR 72

## 2013-02-14 DIAGNOSIS — D235 Other benign neoplasm of skin of trunk: Secondary | ICD-10-CM

## 2013-02-14 DIAGNOSIS — D485 Neoplasm of uncertain behavior of skin: Secondary | ICD-10-CM | POA: Diagnosis not present

## 2013-02-14 DIAGNOSIS — I781 Nevus, non-neoplastic: Secondary | ICD-10-CM | POA: Diagnosis not present

## 2013-02-14 NOTE — Progress Notes (Signed)
  Subjective:    Patient ID: Darrell Alar., male    DOB: [birth year], 67 y.o.   MRN: [id number]  HPI Her knee is a 66 year old male who comes in today for removal of a lesion on his back  He has a 8 mm x 8 mm black lesion lower MID back. After informed consent the lesion was anesthetized with 1% Xylocaine with epinephrine. It was removed with 2 mm margins. The bases cauterized Band-Aids was applied the lesion was sent for pathologic analysis   Review of Systems Review of systems negative    Objective:   Physical Exam Procedure see above       Assessment & Plan:  Clinically appears to be a dysplastic nevus past pending

## 2013-04-11 ENCOUNTER — Ambulatory Visit (HOSPITAL_BASED_OUTPATIENT_CLINIC_OR_DEPARTMENT_OTHER): Payer: Medicare Other | Admitting: Physical Medicine & Rehabilitation

## 2013-04-11 ENCOUNTER — Encounter: Payer: Self-pay | Admitting: Physical Medicine & Rehabilitation

## 2013-04-11 ENCOUNTER — Encounter: Payer: Medicare Other | Attending: Physical Medicine & Rehabilitation

## 2013-04-11 VITALS — BP 131/81 | HR 66 | Resp 14 | Ht 71.0 in | Wt 205.8 lb

## 2013-04-11 DIAGNOSIS — G8190 Hemiplegia, unspecified affecting unspecified side: Secondary | ICD-10-CM | POA: Diagnosis not present

## 2013-04-11 DIAGNOSIS — G8191 Hemiplegia, unspecified affecting right dominant side: Secondary | ICD-10-CM

## 2013-04-11 DIAGNOSIS — M24511 Contracture, right shoulder: Secondary | ICD-10-CM

## 2013-04-11 DIAGNOSIS — I69959 Hemiplegia and hemiparesis following unspecified cerebrovascular disease affecting unspecified side: Secondary | ICD-10-CM | POA: Insufficient documentation

## 2013-04-11 DIAGNOSIS — M24519 Contracture, unspecified shoulder: Secondary | ICD-10-CM | POA: Diagnosis not present

## 2013-04-11 NOTE — Progress Notes (Signed)
Subjective:    Patient ID: Darrell Alar., male    DOB: 05-19-1946, 67 y.o.   MRN: [id number]  HPI 67 year old male  with history of diabetes mellitus, hypertension, severe left carotid  artery stenosis admitted December 09, 2011 for left CA by Dr. Hart Rochester, in  recovery. The patient with decreased level of consciousness and  inability to move right side. He was taken back to OR emergently for  exploration of left CA with thrombectomy of endarterectomized surface And distal ICA. Neurology and Heme/Onc was consulted for input. 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 No significant stenosis. Hypercoag  studies were ordered and the patient was noted to have positive lupus  anticoagulant, but not reliable due to proximity of treatment with  heparin.   Shoulder pain improved but still with reduced ROM Hand tightness Pain Inventory Average Pain 2 Pain Right Now 2 My pain is constant  In the last 24 hours, has pain interfered with the following? General activity 2 Relation with others 2 Enjoyment of life 2 What TIME of day is your pain at its worst? all Sleep (in general) Good  Pain is worse with: unsure Pain improves with: other Relief from Meds: 2  Mobility use a cane how many minutes can you walk? 60 ability to climb steps?  yes do you drive?  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  no  Physicians involved in your care Any changes since last visit?  no   Family History  Problem Relation Age of Onset  . Heart disease Father    History   Social History  . Marital Status: Married    Spouse Name: N/A    Number of Children: N/A  . Years of Education:  N/A   Social History Main Topics  . Smoking status: Former Smoker -- 2.50 packs/day for 30 years    Types: Cigarettes    Quit date: 12/07/2001  . Smokeless tobacco: Never Used  . Alcohol Use: Yes     Comment: socially  . Drug Use: No  . Sexually Active: None   Other Topics Concern  . None   Social History Narrative  . None   Past Surgical History  Procedure Laterality Date  . Hernia repair    . Heel spur surgery    . Heer surgery    . Endarterectomy  12/09/2011    Procedure: ENDARTERECTOMY CAROTID;  Surgeon: [name], MD;  Location: [facility] OR;  Service: Vascular;  Laterality: Left;  Left Carotid endarterectomy with dacron patch angioplasty  . Carotid endarterectomy  12/09/11    Left   Past Medical History  Diagnosis Date  . Diabetes mellitus   . Sleep apnea     cpap      sleep study > 4 yrs  . Hypertension     dr todd   BP 131/81  Pulse 66  Resp 14  Ht 5\' 11"  (1.803 m)  Wt 205 lb 12.8 oz (93.35 kg)  BMI 28.72 kg/m2  SpO2 94%    Review of Systems  Genitourinary:       Bladder control  Psychiatric/Behavioral: Positive for confusion.  All other systems reviewed and are negative.  Objective:   Physical Exam  Nursing note and vitals reviewed. Constitutional: He appears well-developed and well-nourished.  HENT:  Head: Normocephalic and atraumatic.  Eyes: Conjunctivae and EOM are normal. Pupils are equal, round, and reactive to light.  Neurological: He is alert.  Psychiatric: He has a normal mood and affect.  Right shoulder abduction to 90.  Right hand strength 3 minus grip 3 minus deltoid 3/5 biceps and triceps. Decreased coordination right hand. Right lower extremity 4 minus in the hip flexors knee extensors ankle dorsiflexors and plantar flexors.  Ambulation is without assistive device widened base of support. 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 Overall improved. Any supervision to modified independent level with mobility and ADLs. Still not able to drive or perform higher level cognitive tasks  2. Right  Shoulder contracture, abduction to 90.  Monitor for recurrence of pain.  Followup 6 months. Sooner if pain reoccurs

## 2013-04-11 NOTE — Patient Instructions (Signed)
Please call if Arm becomes tighter

## 2013-04-13 ENCOUNTER — Encounter: Payer: Self-pay | Admitting: Physical Medicine & Rehabilitation

## 2013-04-14 ENCOUNTER — Encounter: Payer: Medicare Other | Admitting: Physical Medicine & Rehabilitation

## 2013-05-29 ENCOUNTER — Other Ambulatory Visit: Payer: Medicare Other

## 2013-06-02 ENCOUNTER — Other Ambulatory Visit: Payer: Self-pay

## 2013-06-05 ENCOUNTER — Ambulatory Visit: Payer: Medicare Other | Admitting: Family Medicine

## 2013-06-22 ENCOUNTER — Other Ambulatory Visit: Payer: Self-pay | Admitting: Dermatology

## 2013-06-22 DIAGNOSIS — C44221 Squamous cell carcinoma of skin of unspecified ear and external auricular canal: Secondary | ICD-10-CM | POA: Diagnosis not present

## 2013-06-22 DIAGNOSIS — D239 Other benign neoplasm of skin, unspecified: Secondary | ICD-10-CM | POA: Diagnosis not present

## 2013-06-22 DIAGNOSIS — D2320 Other benign neoplasm of skin of unspecified ear and external auricular canal: Secondary | ICD-10-CM | POA: Diagnosis not present

## 2013-06-22 DIAGNOSIS — L570 Actinic keratosis: Secondary | ICD-10-CM | POA: Diagnosis not present

## 2013-06-22 DIAGNOSIS — D485 Neoplasm of uncertain behavior of skin: Secondary | ICD-10-CM | POA: Diagnosis not present

## 2013-06-28 DIAGNOSIS — H2510 Age-related nuclear cataract, unspecified eye: Secondary | ICD-10-CM | POA: Diagnosis not present

## 2013-06-28 DIAGNOSIS — H43819 Vitreous degeneration, unspecified eye: Secondary | ICD-10-CM | POA: Diagnosis not present

## 2013-06-28 DIAGNOSIS — E119 Type 2 diabetes mellitus without complications: Secondary | ICD-10-CM | POA: Diagnosis not present

## 2013-06-28 DIAGNOSIS — H40059 Ocular hypertension, unspecified eye: Secondary | ICD-10-CM | POA: Diagnosis not present

## 2013-07-01 ENCOUNTER — Other Ambulatory Visit: Payer: Self-pay | Admitting: Family Medicine

## 2013-07-04 ENCOUNTER — Other Ambulatory Visit (INDEPENDENT_AMBULATORY_CARE_PROVIDER_SITE_OTHER): Payer: Medicare Other

## 2013-07-04 ENCOUNTER — Encounter: Payer: Self-pay | Admitting: Family Medicine

## 2013-07-04 DIAGNOSIS — Z23 Encounter for immunization: Secondary | ICD-10-CM

## 2013-07-04 DIAGNOSIS — E119 Type 2 diabetes mellitus without complications: Secondary | ICD-10-CM | POA: Diagnosis not present

## 2013-07-05 ENCOUNTER — Other Ambulatory Visit: Payer: Self-pay

## 2013-07-05 LAB — BASIC METABOLIC PANEL
BUN: 13 mg/dL (ref 6–23)
CO2: 25 mEq/L (ref 19–32)
Calcium: 8.7 mg/dL (ref 8.4–10.5)
GFR: 127.43 mL/min (ref >60.00)
Glucose, Bld: 103 mg/dL — ABNORMAL HIGH (ref 70–99)
Sodium: 137 mEq/L (ref 135–145)

## 2013-07-10 DIAGNOSIS — Z85828 Personal history of other malignant neoplasm of skin: Secondary | ICD-10-CM | POA: Diagnosis not present

## 2013-07-10 DIAGNOSIS — C44221 Squamous cell carcinoma of skin of unspecified ear and external auricular canal: Secondary | ICD-10-CM | POA: Diagnosis not present

## 2013-08-03 ENCOUNTER — Ambulatory Visit: Payer: Medicare Other | Admitting: Family Medicine

## 2013-08-07 ENCOUNTER — Encounter: Payer: Self-pay | Admitting: Family Medicine

## 2013-08-07 ENCOUNTER — Ambulatory Visit (INDEPENDENT_AMBULATORY_CARE_PROVIDER_SITE_OTHER): Payer: Medicare Other | Admitting: Family Medicine

## 2013-08-07 VITALS — BP 120/80 | Temp 98.5°F | Wt 208.0 lb

## 2013-08-07 DIAGNOSIS — E119 Type 2 diabetes mellitus without complications: Secondary | ICD-10-CM | POA: Diagnosis not present

## 2013-08-07 DIAGNOSIS — N401 Enlarged prostate with lower urinary tract symptoms: Secondary | ICD-10-CM

## 2013-08-07 NOTE — Patient Instructions (Signed)
Continue current medications  Return in February for your annual complete exam  Labs one week prior  Soak and file your toenails weekly

## 2013-08-07 NOTE — Progress Notes (Signed)
  Subjective:    Patient ID: Darrell [name]., male    DOB: 1946/07/02, 67 y.o.   MRN: [id number]  HPI [name] is a 67 year old married male nonsmoker who comes in today for followup of diabetes type 1  He's on metformin 1000 mg twice a day blood sugar in the 1 07/27/2019 range A1c 6.2%  He denies any hypoglycemia     Review of Systems    review of systems otherwise negative Objective:   Physical Exam Well-developed well-nourished male no acute distress examination of feet showed normal pulses bilaterally he does have fungal infection and most of the toenails right and left. Advised to soak and file weekly. The right great toenail had some ingrown aspects to it it was trimmed after an alcohol prep Band-Aid was applied with antibiotic ointment       Assessment & Plan:  Diabetes type 2 at goal continue current therapy followup when due for CPX  Final infection of his toenails soak and file weekly

## 2013-10-10 ENCOUNTER — Encounter: Payer: Self-pay | Admitting: Physical Medicine & Rehabilitation

## 2013-10-10 ENCOUNTER — Ambulatory Visit (HOSPITAL_BASED_OUTPATIENT_CLINIC_OR_DEPARTMENT_OTHER): Payer: Medicare Other | Admitting: Physical Medicine & Rehabilitation

## 2013-10-10 ENCOUNTER — Encounter: Payer: Medicare Other | Attending: Physical Medicine & Rehabilitation

## 2013-10-10 VITALS — BP 108/72 | HR 65 | Resp 14 | Ht 71.0 in | Wt 208.0 lb

## 2013-10-10 DIAGNOSIS — I69919 Unspecified symptoms and signs involving cognitive functions following unspecified cerebrovascular disease: Secondary | ICD-10-CM | POA: Insufficient documentation

## 2013-10-10 DIAGNOSIS — G8190 Hemiplegia, unspecified affecting unspecified side: Secondary | ICD-10-CM

## 2013-10-10 DIAGNOSIS — G4730 Sleep apnea, unspecified: Secondary | ICD-10-CM | POA: Diagnosis not present

## 2013-10-10 DIAGNOSIS — I10 Essential (primary) hypertension: Secondary | ICD-10-CM | POA: Insufficient documentation

## 2013-10-10 DIAGNOSIS — I69959 Hemiplegia and hemiparesis following unspecified cerebrovascular disease affecting unspecified side: Secondary | ICD-10-CM | POA: Diagnosis not present

## 2013-10-10 DIAGNOSIS — G8191 Hemiplegia, unspecified affecting right dominant side: Secondary | ICD-10-CM

## 2013-10-10 DIAGNOSIS — E119 Type 2 diabetes mellitus without complications: Secondary | ICD-10-CM | POA: Diagnosis not present

## 2013-10-10 NOTE — Progress Notes (Signed)
Subjective:    Patient ID: Darrell Alar., male    DOB: [birth year], 67 y.o.   MRN: [id number]  HPI Walks 1 hour per day on path [place] No shoulder pain Wife and pt both note continued memory and concentration deficits RUE still weak but no spasms Pain Inventory Average Pain 0 Pain Right Now 0 My pain is n/a  In the last 24 hours, has pain interfered with the following? General activity 0 Relation with others 0 Enjoyment of life 0 What TIME of day is your pain at its worst? n/a Sleep (in general) Good  Pain is worse with: n/a Pain improves with: n/a Relief from Meds: n/a  Mobility use a cane how many minutes can you walk? 60 ability to climb steps?  yes do you drive?  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  no  Physicians involved in your care Any changes since last visit?  no   Family History  Problem Relation Age of Onset  . Heart disease Father    History   Social History  . Marital Status: Married    Spouse Name: N/A    Number of Children: N/A  . Years of Education: N/A   Social History Main Topics  . Smoking status: Former Smoker -- 2.50 packs/day for 30 years    Types: Cigarettes    Quit date: 12/07/2001  . Smokeless tobacco: Never Used  . Alcohol Use: Yes     Comment: socially  . Drug Use: No  . Sexual Activity: None   Other Topics Concern  . None   Social History Narrative  . None   Past Surgical History  Procedure Laterality Date  . Hernia repair    . Heel spur surgery    . Heer surgery    . Endarterectomy  12/09/2011    Procedure: ENDARTERECTOMY CAROTID;  Surgeon: [name], MD;  Location: [facility] OR;  Service: Vascular;  Laterality: Left;  Left Carotid endarterectomy with dacron patch  angioplasty  . Carotid endarterectomy  12/09/11    Left   Past Medical History  Diagnosis Date  . Diabetes mellitus   . Sleep apnea     cpap      sleep study > 4 yrs  . Hypertension     dr todd   BP 108/72  Pulse 65  Resp 14  Ht 5\' 11"  (1.803 m)  Wt 208 lb (94.348 kg)  BMI 29.02 kg/m2  SpO2 95%     Review of Systems  Musculoskeletal: Positive for arthralgias and myalgias.  Neurological: Positive for weakness.  Psychiatric/Behavioral: Positive for confusion.       Objective:   Physical Exam  Neurological: He is alert.  Psychiatric: He has a normal mood and affect.  Right shoulder abduction to 90.  Right hand strength 3 minus grip 3 minus deltoid 3/5 biceps and triceps. Decreased coordination right hand. Right lower extremity 4 minus in the hip flexors knee extensors ankle dorsiflexors and plantar flexors.  Ambulation is without assistive device widened base of support. 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 Overall improved.  supervision to modified independent level with mobility and ADLs. Still not able  to drive or perform higher level cognitive tasks  RTC prn Discussed with pt and wife that pt is at a plateau and unlikely to make significant changes over time.  Encourage continued ambulation

## 2013-10-10 NOTE — Patient Instructions (Addendum)
Please call if spasticity worsens  Zanaflex is for spasticity You may wean off one tablet per week  If you spasms on the Right side of body increase please resume this medicine

## 2013-10-12 ENCOUNTER — Observation Stay (HOSPITAL_COMMUNITY)
Admission: EM | Admit: 2013-10-12 | Discharge: 2013-10-13 | Disposition: A | Discharge status: Home / Self Care | Payer: Medicare Other | Attending: General Surgery | Admitting: General Surgery

## 2013-10-12 ENCOUNTER — Emergency Department (HOSPITAL_COMMUNITY): Payer: Medicare Other

## 2013-10-12 ENCOUNTER — Encounter (HOSPITAL_COMMUNITY): Payer: Self-pay | Admitting: Emergency Medicine

## 2013-10-12 DIAGNOSIS — I10 Essential (primary) hypertension: Secondary | ICD-10-CM | POA: Insufficient documentation

## 2013-10-12 DIAGNOSIS — R4020 Unspecified coma: Secondary | ICD-10-CM

## 2013-10-12 DIAGNOSIS — R55 Syncope and collapse: Secondary | ICD-10-CM | POA: Insufficient documentation

## 2013-10-12 DIAGNOSIS — G319 Degenerative disease of nervous system, unspecified: Secondary | ICD-10-CM | POA: Diagnosis not present

## 2013-10-12 DIAGNOSIS — T1490XA Injury, unspecified, initial encounter: Secondary | ICD-10-CM | POA: Diagnosis not present

## 2013-10-12 DIAGNOSIS — E785 Hyperlipidemia, unspecified: Secondary | ICD-10-CM | POA: Insufficient documentation

## 2013-10-12 DIAGNOSIS — W19XXXA Unspecified fall, initial encounter: Secondary | ICD-10-CM | POA: Insufficient documentation

## 2013-10-12 DIAGNOSIS — R079 Chest pain, unspecified: Secondary | ICD-10-CM | POA: Insufficient documentation

## 2013-10-12 DIAGNOSIS — S2249XA Multiple fractures of ribs, unspecified side, initial encounter for closed fracture: Secondary | ICD-10-CM | POA: Diagnosis not present

## 2013-10-12 DIAGNOSIS — S2239XA Fracture of one rib, unspecified side, initial encounter for closed fracture: Secondary | ICD-10-CM

## 2013-10-12 DIAGNOSIS — T07XXXA Unspecified multiple injuries, initial encounter: Secondary | ICD-10-CM | POA: Diagnosis not present

## 2013-10-12 DIAGNOSIS — R404 Transient alteration of awareness: Secondary | ICD-10-CM | POA: Insufficient documentation

## 2013-10-12 DIAGNOSIS — Z8673 Personal history of transient ischemic attack (TIA), and cerebral infarction without residual deficits: Secondary | ICD-10-CM | POA: Insufficient documentation

## 2013-10-12 DIAGNOSIS — E119 Type 2 diabetes mellitus without complications: Secondary | ICD-10-CM | POA: Insufficient documentation

## 2013-10-12 DIAGNOSIS — I517 Cardiomegaly: Secondary | ICD-10-CM | POA: Insufficient documentation

## 2013-10-12 DIAGNOSIS — G4730 Sleep apnea, unspecified: Secondary | ICD-10-CM | POA: Diagnosis not present

## 2013-10-12 DIAGNOSIS — G9389 Other specified disorders of brain: Secondary | ICD-10-CM | POA: Insufficient documentation

## 2013-10-12 LAB — CBC WITH DIFFERENTIAL/PLATELET
Lymphocytes Relative: 8 % — ABNORMAL LOW (ref 12–46)
Lymphs Abs: 1 10*3/uL (ref 0.7–4.0)
MCV: 88.5 fL (ref 78.0–100.0)
Neutro Abs: 10.4 10*3/uL — ABNORMAL HIGH (ref 1.7–7.7)
Neutrophils Relative %: 84 % — ABNORMAL HIGH (ref 43–77)
Platelets: 175 10*3/uL (ref 150–400)
RBC: 5.15 MIL/uL (ref 4.22–5.81)
RDW: 13.3 % (ref 11.5–15.5)
WBC: 12.4 10*3/uL — ABNORMAL HIGH (ref 4.0–10.5)

## 2013-10-12 LAB — URINALYSIS, ROUTINE W REFLEX MICROSCOPIC
Glucose, UA: NEGATIVE mg/dL
Leukocytes, UA: NEGATIVE
Specific Gravity, Urine: 1.023 (ref 1.005–1.030)
pH: 5.5 (ref 5.0–8.0)

## 2013-10-12 LAB — POCT I-STAT, CHEM 8
BUN: 19 mg/dL (ref 6–23)
Chloride: 103 mEq/L (ref 96–112)
Potassium: 3.9 mEq/L (ref 3.5–5.1)
Sodium: 140 mEq/L (ref 135–145)

## 2013-10-12 MED ORDER — HYDROMORPHONE HCL PF 1 MG/ML IJ SOLN
0.5000 mg | INTRAMUSCULAR | Status: DC | PRN
  Filled 2013-10-12: qty 1

## 2013-10-12 MED ORDER — HYDROCODONE-ACETAMINOPHEN 5-325 MG PO TABS
1.0000 | ORAL_TABLET | Freq: Once | ORAL | Status: AC
  Administered 2013-10-12: 1 via ORAL
  Filled 2013-10-12: qty 1

## 2013-10-12 NOTE — ED Provider Notes (Signed)
CSN: [id number]     Arrival date & time 10/12/13  2026 History   First MD Initiated Contact with Patient 10/12/13 2120     Chief Complaint  Patient presents with  . Loss of Consciousness  . Fall   (Consider location/radiation/quality/duration/timing/severity/associated sxs/prior Treatment) HPI 67 year old gentleman with a history of diabetes and hypertension right ear by EMS for an unwitnessed syncope episode at home. Per the patient he was ambulating in his bedroom and does not recall what happened next. He stated that he woke up on the floor a nonhealing other. He denied any symptoms prior to the fall.  Pain now in R chest, no radiation, sharp, painful with inspiration, moderate severity.   Past Medical History  Diagnosis Date  . Diabetes mellitus   . Sleep apnea     cpap      sleep study > 4 yrs  . Hypertension     dr Tawanna Cooler   Past Surgical History  Procedure Laterality Date  . Hernia repair    . Heel spur surgery    . Heer surgery    . Endarterectomy  12/09/2011    Procedure: ENDARTERECTOMY CAROTID;  Surgeon: [name], MD;  Location: [facility] OR;  Service: Vascular;  Laterality: Left;  Left Carotid endarterectomy with dacron patch angioplasty  . Carotid endarterectomy  12/09/11    Left   Family History  Problem Relation Age of Onset  . Heart disease Father    History  Substance Use Topics  . Smoking status: Former Smoker -- 2.50 packs/day for 30 years    Types: Cigarettes    Quit date: 12/07/2001  . Smokeless tobacco: Never Used  . Alcohol Use: Yes     Comment: socially    Review of Systems  Constitutional: Negative for fever and chills.  HENT: Negative for sore throat.   Eyes: Negative for pain.  Respiratory: Positive for shortness of breath. Negative for cough.   Cardiovascular: Negative for chest pain.  Gastrointestinal: Negative for nausea, vomiting and abdominal pain.  Genitourinary: Negative for dysuria and flank pain.  Musculoskeletal: Negative for back  pain and neck pain.  Skin: Negative for rash.  Neurological: Negative for seizures and headaches.    Allergies  Oxycodone-acetaminophen  Home Medications   Current Outpatient Rx  Name  Route  Sig  Dispense  Refill  . aspirin EC 81 MG tablet   Oral   Take 81 mg by mouth daily.         . citalopram (CELEXA) 10 MG tablet   Oral   Take 10 mg by mouth daily.         [name] lisinopril (PRINIVIL,ZESTRIL) 10 MG tablet   Oral   Take 10 mg by mouth every morning.         . metFORMIN (GLUCOPHAGE) 1000 MG tablet   Oral   Take 1,000 mg by mouth 2 (two) times daily with a meal.         . tiZANidine (ZANAFLEX) 2 MG tablet   Oral   Take 2 mg by mouth every 8 (eight) hours as needed for muscle spasms.          BP 160/90  Pulse 76  Temp(Src) 98.2 F (36.8 C) (Oral)  Resp 20  SpO2 90% Physical Exam  Constitutional: He is oriented to person, place, and time. He appears well-developed and well-nourished. No distress.  HENT:  Head: Normocephalic. Head is with abrasion (abrasion to R forehead).  Eyes: Pupils are equal, round, and reactive  to light.  Neck: Normal range of motion.  Cardiovascular: Normal rate and regular rhythm.   Pulmonary/Chest: Effort normal and breath sounds normal. No respiratory distress. He exhibits bony tenderness (R side of chest).  Abdominal: Soft. He exhibits no distension. There is no tenderness.  Musculoskeletal: Normal range of motion.       Cervical back: He exhibits no bony tenderness and no deformity.       Thoracic back: He exhibits no bony tenderness and no deformity.       Lumbar back: He exhibits no bony tenderness and no deformity.  Neurological: He is alert and oriented to person, place, and time.  Skin: Skin is warm. He is not diaphoretic.    ED Course  Procedures (including critical care time) Labs Review Labs Reviewed  CBC WITH DIFFERENTIAL - Abnormal; Notable for the following:    WBC 12.4 (*)    Neutrophils Relative % 84 (*)     Neutro Abs 10.4 (*)    Lymphocytes Relative 8 (*)    All other components within normal limits  POCT I-STAT, CHEM 8 - Abnormal; Notable for the following:    Glucose, Bld 160 (*)    All other components within normal limits  URINALYSIS, ROUTINE W REFLEX MICROSCOPIC  CBC  CREATININE, SERUM   Imaging Review Dg Ribs Unilateral W/chest Right  10/12/2013   CLINICAL DATA:  Syncope.  Right rib pain.  EXAM: RIGHT RIBS AND CHEST - 3+ VIEW  COMPARISON:  12/08/2011  FINDINGS: Posterior right 8th and 9th rib fractures with distraction but no significant displacement. Mild streaky basilar opacities is likely atelectasis in the setting of fractures and low lung volumes. Cardiomegaly which is chronic. No edema or infiltrate. No pneumothorax. Osteopenia.  IMPRESSION: 1. Posterior right 8th and 9th rib fractures. No pneumothorax. 2. Chronic cardiomegaly.   Electronically Signed   By: Tiburcio Pea M.D.   On: 10/12/2013 22:41   Ct Head Wo Contrast  10/12/2013   CLINICAL DATA:  Syncope, history of prior stroke  EXAM: CT HEAD WITHOUT CONTRAST  TECHNIQUE: Contiguous axial images were obtained from the base of the skull through the vertex without intravenous contrast.  COMPARISON:  Prior MRI from 12/09/2011  FINDINGS: Diffuse prominence of the CSF containing spaces is compatible with generalized atrophy. Scattered and confluent hypo intensity within the periventricular and deep white matter is most consistent with chronic small vessel ischemic changes. Extensive encephalomalacia seen within the left frontal and parietal lobes is consistent with remote left MCA territory infarct. This is seen in a watershed distribution. Chronic lacunar infarctions are noted within the basal ganglia bilaterally, left greater than right.  There is no acute intracranial hemorrhage or large vessel territory infarct. No mass lesion or midline shift. Gray-white matter differentiation is well maintained. Ventricles are normal in size without  evidence of hydrocephalus. CSF containing spaces are within normal limits. No extra-axial fluid collection.  The calvarium is intact.  Orbital soft tissues are within normal limits.  The paranasal sinuses and mastoid air cells are well pneumatized and free of fluid.  Scalp soft tissues are unremarkable.  IMPRESSION: 1. No acute intracranial process. 2. Encephalomalacia within the left frontal and parietal lobes, consistent with remote left cerebral hemisphere watershed territory infarct. 3. Subcentimeter chronic lacunar infarctions within the bilateral basal ganglia, left greater than right. 4. Atrophy with chronic microvascular ischemic disease.   Electronically Signed   By: Rise Mu M.D.   On: 10/12/2013 22:58    EKG Interpretation  None       MDM   1. LOC (loss of consciousness)    67 year old male who presents from EMS for an unwitnessed syncope at home. Patient states he thinks that he went from sitting to standing quickly after hearing the phone rang and the Nexium he knew he was on the floor with pain in his chest on the right side. He complains of some mild shortness of breath secondary to his pain in the right chest.  Patient labs were performed and demonstrate no acute concerning findings at this time. He does have a mild leukocytosis with a left shift possibly secondary to his fall. A CT of the head was performed secondary to his syncope and loss of consciousness. CT head demonstrates no acute intracranial process.  X-rays of the right-sided ribs were performed demonstrating right eighth and ninth rib fractures with distraction but without displacement. Patient with pain in similar location on clinical exam. The patient was given oral pain medicine and his pain upon reassessment was found to be 0/10. However the patient was having poor oxygen saturations with sats in the high 80% on room air. Because of this and because of evidence of atelectasis he is triaged to the trauma  service for admission for pain control and for continued incentive spirometry. Onset is from a tree here, the patient was want to be able to pull breaths ranging from about 1000-1200 cc.  Patient admitted in stable condition. Patient seen and evaluated by my attending, Dr. Alonna Minium, MD 10/13/13 0040

## 2013-10-12 NOTE — ED Notes (Signed)
Gave patient a spirometer per Dr instructions. MD at Bedside.

## 2013-10-12 NOTE — ED Notes (Signed)
Here by EMS for unwitnessed syncope at home, lives with wife, pt with no clear recollection of fall, was heading to bed and awoke on floor, lamp and stool knocked over, abrasion to R forehead, c/o R flank area rib pain. Denies neck or back pain. Residual R sided deficits from past CVA. Pt alert, NAD, calm, interactive, skin W&D, resps e/u, speaking in clear complete sentences. No dyspnea, LS CTA. No IV CBG or immobilization PTA.

## 2013-10-12 NOTE — ED Notes (Signed)
Patient unable to stand stably to finish Orthostatic Vital Signs. Pain in lower back and overall weakness.

## 2013-10-12 NOTE — ED Notes (Signed)
Mentions developing HA. Denies blood thinners (other than ASA).

## 2013-10-12 NOTE — ED Notes (Signed)
Pt alert, NAD, calm, interactive, wife at [facility], pain med given, pt to xray/CT, pharmacy to f/u on pt allergies.

## 2013-10-13 ENCOUNTER — Observation Stay (HOSPITAL_COMMUNITY): Payer: Medicare Other

## 2013-10-13 ENCOUNTER — Encounter (HOSPITAL_COMMUNITY): Payer: Self-pay | Admitting: *Deleted

## 2013-10-13 DIAGNOSIS — S2249XA Multiple fractures of ribs, unspecified side, initial encounter for closed fracture: Secondary | ICD-10-CM | POA: Diagnosis not present

## 2013-10-13 DIAGNOSIS — W19XXXA Unspecified fall, initial encounter: Secondary | ICD-10-CM

## 2013-10-13 DIAGNOSIS — J9819 Other pulmonary collapse: Secondary | ICD-10-CM | POA: Diagnosis not present

## 2013-10-13 DIAGNOSIS — S2239XA Fracture of one rib, unspecified side, initial encounter for closed fracture: Secondary | ICD-10-CM

## 2013-10-13 LAB — CBC
HCT: 43.9 % (ref 39.0–52.0)
Hemoglobin: 15.1 g/dL (ref 13.0–17.0)
MCH: 30.5 pg (ref 26.0–34.0)
MCV: 88.7 fL (ref 78.0–100.0)
Platelets: 181 10*3/uL (ref 150–400)
RBC: 4.95 MIL/uL (ref 4.22–5.81)
WBC: 12 10*3/uL — ABNORMAL HIGH (ref 4.0–10.5)

## 2013-10-13 LAB — GLUCOSE, CAPILLARY
Glucose-Capillary: 137 mg/dL — ABNORMAL HIGH (ref 70–99)
Glucose-Capillary: 147 mg/dL — ABNORMAL HIGH (ref 70–99)

## 2013-10-13 LAB — CREATININE, SERUM: GFR calc Af Amer: >90 mL/min (ref >90)

## 2013-10-13 MED ORDER — ASPIRIN EC 81 MG PO TBEC
81.0000 mg | DELAYED_RELEASE_TABLET | Freq: Every day | ORAL | Status: DC
  Administered 2013-10-13: 12:00:00 81 mg via ORAL
  Filled 2013-10-13 (×2): qty 1

## 2013-10-13 MED ORDER — LISINOPRIL 10 MG PO TABS
10.0000 mg | ORAL_TABLET | Freq: Every morning | ORAL | Status: DC
  Administered 2013-10-13: 10 mg via ORAL
  Filled 2013-10-13 (×2): qty 1

## 2013-10-13 MED ORDER — CITALOPRAM HYDROBROMIDE 10 MG PO TABS
10.0000 mg | ORAL_TABLET | Freq: Every day | ORAL | Status: DC
  Administered 2013-10-13: 11:00:00 10 mg via ORAL
  Filled 2013-10-13 (×2): qty 1

## 2013-10-13 MED ORDER — ENOXAPARIN SODIUM 40 MG/0.4ML ~~LOC~~ SOLN
40.0000 mg | SUBCUTANEOUS | Status: DC
  Filled 2013-10-13: qty 0.4

## 2013-10-13 MED ORDER — SODIUM CHLORIDE 0.9 % IV SOLN: 250.0000 mL | INTRAVENOUS | Status: DC | PRN

## 2013-10-13 MED ORDER — HYDROCODONE-ACETAMINOPHEN 5-325 MG PO TABS: 1.0000 | ORAL_TABLET | ORAL | Status: DC | PRN

## 2013-10-13 MED ORDER — TIZANIDINE HCL 2 MG PO TABS
2.0000 mg | ORAL_TABLET | Freq: Three times a day (TID) | ORAL | Status: DC | PRN
  Filled 2013-10-13: qty 1

## 2013-10-13 MED ORDER — METFORMIN HCL 500 MG PO TABS
1000.0000 mg | ORAL_TABLET | Freq: Two times a day (BID) | ORAL | Status: DC
  Administered 2013-10-13: 09:00:00 1000 mg via ORAL
  Filled 2013-10-13 (×3): qty 2

## 2013-10-13 MED ORDER — HYDROMORPHONE HCL PF 1 MG/ML IJ SOLN: 0.5000 mg | INTRAMUSCULAR | Status: DC | PRN

## 2013-10-13 MED ORDER — HYDROCODONE-ACETAMINOPHEN 5-325 MG PO TABS
2.0000 | ORAL_TABLET | ORAL | Status: DC | PRN
  Administered 2013-10-13 (×2): 2 via ORAL
  Filled 2013-10-13 (×2): qty 2

## 2013-10-13 MED ORDER — INSULIN ASPART 100 UNIT/ML ~~LOC~~ SOLN
0.0000 [IU] | Freq: Three times a day (TID) | SUBCUTANEOUS | Status: DC
  Administered 2013-10-13 (×2): 2 [IU] via SUBCUTANEOUS

## 2013-10-13 MED ORDER — SODIUM CHLORIDE 0.9 % IJ SOLN: 3.0000 mL | INTRAMUSCULAR | Status: DC | PRN

## 2013-10-13 MED ORDER — SODIUM CHLORIDE 0.9 % IJ SOLN
3.0000 mL | Freq: Two times a day (BID) | INTRAMUSCULAR | Status: DC
  Administered 2013-10-13: 3 mL via INTRAVENOUS

## 2013-10-13 NOTE — ED Notes (Signed)
Transporting patient to new room assignment. 

## 2013-10-13 NOTE — Evaluation (Signed)
Physical Therapy Evaluation Patient Details Name: Darrell [name]. MRN: [id number] DOB: [birth year] Today's Date: 10/13/2013 Time: 1191-4782 PT Time Calculation (min): 19 min  PT Assessment / Plan / Recommendation History of Present Illness  Pt is a 67 y.o. male adm from home due to syncopal episode which resulted in multiple rib fractures. 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  PMH also includes HTN, DM2, hyperlipidemia  Clinical Impression  Pt adm due to the above. Presents to therapy with guarded gt and slight instability due to rib pain. Wife and pt report he is ambulating at baseline at this time. Encouraged pt to consult with PCP and OPPT for vestibular evaluation to determine cause of syncopal episode. With 2 syncopal episodes, pt has been changing positions, could be vestibular vs orthostatic related.  Pt and wife were pleasant and cooperative with therapy. Pt is safe from mobility standpoint to D/C today. No equipment needed. Pt will have 24/7 (A) from wife. Will sign off at this time.     PT Assessment  All further PT needs can be met in the next venue of care    Follow Up Recommendations  Outpatient PT;Supervision for mobility/OOB (vestibular OPPT )    Does the patient have the potential to tolerate intense rehabilitation      Barriers to Discharge        Equipment Recommendations  None recommended by PT    Recommendations for Other Services     Frequency      Precautions / Restrictions Precautions Precautions: Fall Precaution Comments: has had 2 syncopal in last year  Restrictions Weight Bearing Restrictions: No   Pertinent Vitals/Pain C/o pain when moving in ribs; did not rate pain.       Mobility  Bed Mobility Bed Mobility: Not assessed Details for Bed Mobility Assistance: pt sitting in chair and returned to chair; educated on log rolling technique and to use pillow against chest to reduce pain when moving in ribs   Transfers Transfers: Sit to Stand;Stand to Sit Sit to Stand: 5: Supervision;From chair/3-in-1;With armrests Stand to Sit: 5: Supervision;To chair/3-in-1;With armrests Details for Transfer Assistance: supervision for safety; pt slightly unsteady initially but did not require (A)  Ambulation/Gait Ambulation/Gait Assistance: 4: Min guard;5: Supervision Ambulation Distance (Feet): 100 Feet Assistive device: 1 person hand held assist;None Ambulation/Gait Assistance Details: one person handheld (A) at times when necessary due to pain and guarded gt; wife reports pt is ambulating at baseline; no dizziness or LOB noted; will require supervision for OOB activiity initially when D/C home to ensure safety Gait Pattern: Decreased stride length;Decreased stance time - right;Wide base of support;Trunk flexed (guarded ) Gait velocity: decreased; guarded due to pain in ribs General Gait Details: pt with slight antalgic like gt due to flank pain on Rt side Stairs: No         PT Diagnosis: Abnormality of gait;Acute pain;Generalized weakness  PT Problem List: Decreased strength;Decreased balance;Decreased mobility;Pain;Decreased safety awareness PT Treatment Interventions:       PT Goals(Current goals can be found in the care plan section) Acute Rehab PT Goals Patient Stated Goal: to go home today PT Goal Formulation: No goals set, d/c therapy  Visit Information  Last PT Received On: 10/13/13 Assistance Needed: +1 History of Present Illness: Pt is a 67 y.o. male adm from home due to syncopal episode which resulted in multiple rib fractures. 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  Prior Functioning  Home Living Family/patient expects to be discharged to:: Private residence Living Arrangements: Spouse/significant other Available Help at Discharge: Family;Available PRN/intermittently Type of Home: House Home Access: Stairs to enter [employer] of Steps:  1 Entrance Stairs-Rails: None Home Layout: Two level;Able to live on main level with bedroom/bathroom Home Equipment: Gilmer Mor - single point;Shower seat;Grab bars - toilet;Grab bars - tub/shower Prior Function Level of Independence: Needs assistance Gait / Transfers Assistance Needed: pt able to ambulate around house without AD; uses SPC when in community  ADL's / Homemaking Assistance Needed: wife helps pt get dressed and take bath; pt able to minimal due to CVA in 2013 Comments: wife also reports she will help cut patient's food if needed secondary to Rt UE weakness and pt being Rt handed dominant. pt was very independent with mobility and would ambulate over a mile per day around neighborhood independently with [facility]  Communication Communication: Expressive difficulties Dominant Hand: Right    Cognition  Cognition Arousal/Alertness: Awake/alert Behavior During Therapy: WFL for tasks assessed/performed Overall Cognitive Status: History of cognitive impairments - at baseline (since CVA in 2013) Memory: Decreased short-term memory    Extremity/Trunk Assessment Upper Extremity Assessment Upper Extremity Assessment: RUE deficits/detail RUE Deficits / Details: decreased ROM and strength secondary to CVA in 2013 Lower Extremity Assessment Lower Extremity Assessment: Generalized weakness Cervical / Trunk Assessment Cervical / Trunk Assessment: Normal   Balance Balance Balance Assessed: Yes Static Sitting Balance Static Sitting - Balance Support: Feet supported;No upper extremity supported Static Sitting - Level of Assistance: 7: Independent Static Standing Balance Static Standing - Balance Support: No upper extremity supported;Left upper extremity supported;During functional activity Static Standing - Level of Assistance: 5: Stand by assistance High Level Balance High Level Balance Activites: Direction changes High Level Balance Comments: min guard for directional changes; pt slightly  guarded due to pain  End of Session PT - End of Session Equipment Utilized During Treatment: Gait belt Activity Tolerance: Patient tolerated treatment well Patient left: in chair;with call bell/phone within reach;with chair alarm set;with family/visitor present Nurse Communication: Mobility status  GP Functional Assessment Tool Used: clinical judgement  Functional Limitation: Mobility: Walking and moving around Mobility: Walking and Moving Around Current Status [phone]): At least 1 percent but less than 20 percent impaired, limited or restricted Mobility: Walking and Moving Around Goal Status [phone]): At least 1 percent but less than 20 percent impaired, limited or restricted Mobility: Walking and Moving Around Discharge Status [phone]): At least 1 percent but less than 20 percent impaired, limited or restricted   Donell Sievert,  [phone] 10/13/2013, 11:38 AM

## 2013-10-13 NOTE — Progress Notes (Signed)
Pt discharged to home with wife. Pt. Is alert and oriented. Pt is hemodynamically stable. AVS reviewed with pt. Capable of reverbalizing medication regimen. Discharge plan appropriate and in place. Taken down in wheelchair.

## 2013-10-13 NOTE — ED Notes (Signed)
EDP at [facility], RT at [facility] instructing on use of IS, family present, pt "feels better", dilaudid cancelled.

## 2013-10-13 NOTE — Progress Notes (Signed)
Trauma Service Note  Subjective: Patient with some spasmodic pain on the right side.  No other acute distress.Has a bandage on his right elbow with an apparent laceration that has not been seen.  Will check prior to discharge  Objective: Vital signs in last 24 hours: Temp:  [97.7 F (36.5 C)-99.8 F (37.7 C)] 97.7 F (36.5 C) (12/19 0505) Pulse Rate:  [66-91] 66 (12/19 0505) Resp:  [17-29] 20 (12/19 0505) BP: (114-193)/(76-112) 114/76 mmHg (12/19 0505) SpO2:  [90 %-96 %] 96 % (12/19 0505) Weight:  [92 kg (202 lb 13.2 oz)] 92 kg (202 lb 13.2 oz) (12/19 0155) Last BM Date: 10/12/13  Intake/Output from previous day: 12/18 0701 - 12/19 0700 In: -  Out: 310 [Urine:310] Intake/Output this shift:    General: No acute distress.  Wery sore  Lungs: Clear to auscultation.  No shortness of breath.  CXR without significant worsening.  Abd: Soft, good bowel sounds.  Benign.  Extremities: Right elbow abrasion or contusion  Neuro: right hemiparesis  Lab Results: CBC   Recent Labs  10/12/13 2038 10/12/13 2136 10/13/13 0034  WBC 12.4*  --  12.0*  HGB 15.7 16.3 15.1  HCT 45.6 48.0 43.9  PLT 175  --  181   BMET  Recent Labs  10/12/13 2136 10/13/13 0034  NA 140  --   K 3.9  --   CL 103  --   GLUCOSE 160*  --   BUN 19  --   CREATININE 0.70 0.59   PT/INR No results found for this basename: LABPROT, INR,  in the last 72 hours ABG No results found for this basename: PHART, PCO2, PO2, HCO3,  in the last 72 hours  Studies/Results: Dg Chest 2 View  10/13/2013   CLINICAL DATA:  History of rib fracture.  EXAM: CHEST  2 VIEW  COMPARISON:  October 12, 2013.  FINDINGS: The lungs are reasonably well inflated. There is no evidence of a pneumothorax nor pleural effusion today. The interstitial markings are increased posteriorly in the lower lobes but especially on the right. The cardiopericardial silhouette is enlarged. The pulmonary vascularity is not engorged. There is mild  tortuosity of the descending thoracic aorta. The right posterior 8th rib fracture is visible. The right posterior 9th rib fracture may be partially visible.  IMPRESSION: 1. Since yesterday's study no pneumothorax has developed. There is no significant pleural effusion. There is atelectasis posteriorly at the lung bases predominantly on the right. 2. The cardiopericardial silhouette remains enlarged. There is no evidence of acute pulmonary edema.   Electronically Signed   By: David  [place]   On: 10/13/2013 08:01   Dg Ribs Unilateral W/chest Right  10/12/2013   CLINICAL DATA:  Syncope.  Right rib pain.  EXAM: RIGHT RIBS AND CHEST - 3+ VIEW  COMPARISON:  12/08/2011  FINDINGS: Posterior right 8th and 9th rib fractures with distraction but no significant displacement. Mild streaky basilar opacities is likely atelectasis in the setting of fractures and low lung volumes. Cardiomegaly which is chronic. No edema or infiltrate. No pneumothorax. Osteopenia.  IMPRESSION: 1. Posterior right 8th and 9th rib fractures. No pneumothorax. 2. Chronic cardiomegaly.   Electronically Signed   By: Tiburcio Pea M.D.   On: 10/12/2013 22:41   Ct Head Wo Contrast  10/12/2013   CLINICAL DATA:  Syncope, history of prior stroke  EXAM: CT HEAD WITHOUT CONTRAST  TECHNIQUE: Contiguous axial images were obtained from the base of the skull through the vertex without intravenous contrast.  COMPARISON:  Prior MRI from 12/09/2011  FINDINGS: Diffuse prominence of the CSF containing spaces is compatible with generalized atrophy. Scattered and confluent hypo intensity within the periventricular and deep white matter is most consistent with chronic small vessel ischemic changes. Extensive encephalomalacia seen within the left frontal and parietal lobes is consistent with remote left MCA territory infarct. This is seen in a watershed distribution. Chronic lacunar infarctions are noted within the basal ganglia bilaterally, left greater than right.   There is no acute intracranial hemorrhage or large vessel territory infarct. No mass lesion or midline shift. Gray-white matter differentiation is well maintained. Ventricles are normal in size without evidence of hydrocephalus. CSF containing spaces are within normal limits. No extra-axial fluid collection.  The calvarium is intact.  Orbital soft tissues are within normal limits.  The paranasal sinuses and mastoid air cells are well pneumatized and free of fluid.  Scalp soft tissues are unremarkable.  IMPRESSION: 1. No acute intracranial process. 2. Encephalomalacia within the left frontal and parietal lobes, consistent with remote left cerebral hemisphere watershed territory infarct. 3. Subcentimeter chronic lacunar infarctions within the bilateral basal ganglia, left greater than right. 4. Atrophy with chronic microvascular ischemic disease.   Electronically Signed   By: Rise Mu M.D.   On: 10/12/2013 22:58    Anti-infectives: Anti-infectives   None      Assessment/Plan: s/p  Advance diet Discharge Will get PT involved Possible discharge later today.  LOS: 1 day   Marta Lamas. Gae Bon, MD, FACS [phone] Trauma Surgeon 10/13/2013

## 2013-10-13 NOTE — H&P (Signed)
History   Darrell [name]. is an 67 y.o. male.   Chief Complaint:  Chief Complaint  Patient presents with  . Loss of Consciousness  . Fall    Loss of Consciousness Associated symptoms: chest pain and focal weakness   Associated symptoms: no shortness of breath   Fall This is a new problem. The current episode started today. The problem occurs rarely. The problem has been rapidly improving. Associated symptoms include chest pain. Pertinent negatives include no coughing.  Pt got up at home  And fell on right side.  Complaining or right chest pain  But better since he arrived.  No LOC or HA.    Past Medical History  Diagnosis Date  . Diabetes mellitus   . Sleep apnea     cpap      sleep study > 4 yrs  . Hypertension     dr Tawanna Cooler    Past Surgical History  Procedure Laterality Date  . Hernia repair    . Heel spur surgery    . Heer surgery    . Endarterectomy  12/09/2011    Procedure: ENDARTERECTOMY CAROTID;  Surgeon: [name], MD;  Location: [facility] OR;  Service: Vascular;  Laterality: Left;  Left Carotid endarterectomy with dacron patch angioplasty  . Carotid endarterectomy  12/09/11    Left    Family History  Problem Relation Age of Onset  . Heart disease Father    Social History:  reports that he quit smoking about 11 years ago. His smoking use included Cigarettes. He has a 75 pack-year smoking history. He has never used smokeless tobacco. He reports that he drinks alcohol. He reports that he does not use illicit drugs.  Allergies   Allergies  Allergen Reactions  . Oxycodone-Acetaminophen     REACTION: feels sick (patient reports taking acetaminophen without issue)     Home Medications   (Not in a hospital admission)  Trauma Course   Results for orders placed during the hospital encounter of 10/12/13 (from the past 48 hour(s))  CBC WITH DIFFERENTIAL     Status: Abnormal   Collection Time    10/12/13  8:38 PM      Result Value Range   WBC 12.4 (*) 4.0 -  10.5 K/uL   RBC 5.15  4.22 - 5.81 MIL/uL   Hemoglobin 15.7  13.0 - 17.0 g/dL   HCT 16.1  09.6 - 04.5 %   MCV 88.5  78.0 - 100.0 fL   MCH 30.5  26.0 - 34.0 pg   MCHC 34.4  30.0 - 36.0 g/dL   RDW 40.9  81.1 - 91.4 %   Platelets 175  150 - 400 K/uL   Neutrophils Relative % 84 (*) 43 - 77 %   Neutro Abs 10.4 (*) 1.7 - 7.7 K/uL   Lymphocytes Relative 8 (*) 12 - 46 %   Lymphs Abs 1.0  0.7 - 4.0 K/uL   Monocytes Relative 7  3 - 12 %   Monocytes Absolute 0.9  0.1 - 1.0 K/uL   Eosinophils Relative 1  0 - 5 %   Eosinophils Absolute 0.1  0.0 - 0.7 K/uL   Basophils Relative 0  0 - 1 %   Basophils Absolute 0.0  0.0 - 0.1 K/uL  URINALYSIS, ROUTINE W REFLEX MICROSCOPIC     Status: None   Collection Time    10/12/13  8:56 PM      Result Value Range   Color, Urine YELLOW  YELLOW  APPearance CLEAR  CLEAR   Specific Gravity, Urine 1.023  1.005 - 1.030   pH 5.5  5.0 - 8.0   Glucose, UA NEGATIVE  NEGATIVE mg/dL   Hgb urine dipstick NEGATIVE  NEGATIVE   Bilirubin Urine NEGATIVE  NEGATIVE   Ketones, ur NEGATIVE  NEGATIVE mg/dL   Protein, ur NEGATIVE  NEGATIVE mg/dL   Urobilinogen, UA 1.0  0.0 - 1.0 mg/dL   Nitrite NEGATIVE  NEGATIVE   Leukocytes, UA NEGATIVE  NEGATIVE   Comment: MICROSCOPIC NOT DONE ON URINES WITH NEGATIVE PROTEIN, BLOOD, LEUKOCYTES, NITRITE, OR GLUCOSE <1000 mg/dL.  POCT I-STAT, CHEM 8     Status: Abnormal   Collection Time    10/12/13  9:36 PM      Result Value Range   Sodium 140  135 - 145 mEq/L   Potassium 3.9  3.5 - 5.1 mEq/L   Chloride 103  96 - 112 mEq/L   BUN 19  6 - 23 mg/dL   Creatinine, Ser 4.78  0.50 - 1.35 mg/dL   Glucose, Bld 295 (*) 70 - 99 mg/dL   Calcium, Ion 6.21  3.08 - 1.30 mmol/L   TCO2 26  0 - 100 mmol/L   Hemoglobin 16.3  13.0 - 17.0 g/dL   HCT 65.7  84.6 - 96.2 %   Dg Ribs Unilateral W/chest Right  10/12/2013   CLINICAL DATA:  Syncope.  Right rib pain.  EXAM: RIGHT RIBS AND CHEST - 3+ VIEW  COMPARISON:  12/08/2011  FINDINGS: Posterior right 8th  and 9th rib fractures with distraction but no significant displacement. Mild streaky basilar opacities is likely atelectasis in the setting of fractures and low lung volumes. Cardiomegaly which is chronic. No edema or infiltrate. No pneumothorax. Osteopenia.  IMPRESSION: 1. Posterior right 8th and 9th rib fractures. No pneumothorax. 2. Chronic cardiomegaly.   Electronically Signed   By: Tiburcio Pea M.D.   On: 10/12/2013 22:41   Ct Head Wo Contrast  10/12/2013   CLINICAL DATA:  Syncope, history of prior stroke  EXAM: CT HEAD WITHOUT CONTRAST  TECHNIQUE: Contiguous axial images were obtained from the base of the skull through the vertex without intravenous contrast.  COMPARISON:  Prior MRI from 12/09/2011  FINDINGS: Diffuse prominence of the CSF containing spaces is compatible with generalized atrophy. Scattered and confluent hypo intensity within the periventricular and deep white matter is most consistent with chronic small vessel ischemic changes. Extensive encephalomalacia seen within the left frontal and parietal lobes is consistent with remote left MCA territory infarct. This is seen in a watershed distribution. Chronic lacunar infarctions are noted within the basal ganglia bilaterally, left greater than right.  There is no acute intracranial hemorrhage or large vessel territory infarct. No mass lesion or midline shift. Gray-white matter differentiation is well maintained. Ventricles are normal in size without evidence of hydrocephalus. CSF containing spaces are within normal limits. No extra-axial fluid collection.  The calvarium is intact.  Orbital soft tissues are within normal limits.  The paranasal sinuses and mastoid air cells are well pneumatized and free of fluid.  Scalp soft tissues are unremarkable.  IMPRESSION: 1. No acute intracranial process. 2. Encephalomalacia within the left frontal and parietal lobes, consistent with remote left cerebral hemisphere watershed territory infarct. 3.  Subcentimeter chronic lacunar infarctions within the bilateral basal ganglia, left greater than right. 4. Atrophy with chronic microvascular ischemic disease.   Electronically Signed   By: Rise Mu M.D.   On: 10/12/2013 22:58    Review  of Systems  Constitutional: Negative.   HENT: Negative.   Eyes: Negative.   Respiratory: Negative for cough and shortness of breath.   Cardiovascular: Positive for chest pain and syncope.  Genitourinary: Negative.   Musculoskeletal: Negative.   Neurological: Positive for focal weakness.       Right hemiplegia from CVA in past  Psychiatric/Behavioral: Negative.     Blood pressure 160/90, pulse 76, temperature 98.2 F (36.8 C), temperature source Oral, resp. rate 20, SpO2 90.00%. Physical Exam  Constitutional: He is oriented to person, place, and time. He appears well-developed and well-nourished.  HENT:  Head: Normocephalic and atraumatic.  Eyes: EOM are normal. Pupils are equal, round, and reactive to light.  Neck: Normal range of motion. Neck supple.  Scar left  Cardiovascular: Normal rate and regular rhythm.   Respiratory: Effort normal and breath sounds normal. He exhibits tenderness.  GI: Soft. Bowel sounds are normal. He exhibits no distension. There is no tenderness.  Musculoskeletal: Normal range of motion.  Neurological: He is alert and oriented to person, place, and time. GCS eye subscore is 4. GCS verbal subscore is 5. GCS motor subscore is 6.  Right hemiplegia chronic  Skin: Skin is warm and dry.  Psychiatric: He has a normal mood and affect. His behavior is normal. Judgment and thought content normal.     Assessment/Plan Fall Right rib fractures posterior 8 / 9  Patient Active Problem List   Diagnosis Date Noted  . Rib fractures 10/13/2013  . Dysplastic nevus of trunk 02/14/2013  . Bicipital tendinitis of right shoulder 07/12/2012  . Subacromial bursitis, right 03/28/2012  . Right spastic hemiparesis 02/02/2012  .  Occlusion and stenosis of carotid artery with cerebral infarction 01/26/2012  . Hemiplegia affecting right dominant side 12/24/2011  . CVA (cerebral infarction) 12/15/2011  . Occlusion and stenosis of carotid artery without mention of cerebral infarction 12/08/2011  . Carotid artery plaque 12/04/2011  . Left carotid bruit 11/30/2011  . AUTONOMIC NEUROPATHY, DIABETIC 11/25/2010  . HYPERLIPIDEMIA 03/15/2008  . SLEEP APNEA, OBSTRUCTIVE, SEVERE 07/27/2007  . DIABETES MELLITUS, TYPE II 06/15/2007  . HYPERTENSION 06/15/2007  admit for pain control and oxygen therapy.   cxr in am On home CPAP continue here  Tore Carreker A. 10/13/2013, 12:31 AM   Procedures

## 2013-10-13 NOTE — Progress Notes (Signed)
Nutrition Brief Note  Patient identified on the Malnutrition Screening Tool (MST) Report  Wt Readings from Last 15 Encounters:  10/13/13 202 lb 13.2 oz (92 kg)  10/10/13 208 lb (94.348 kg)  08/07/13 208 lb (94.348 kg)  04/11/13 205 lb 12.8 oz (93.35 kg)  02/07/13 201 lb (91.173 kg)  01/17/13 202 lb (91.627 kg)  12/06/12 201 lb 6.4 oz (91.354 kg)  12/06/12 199 lb (90.266 kg)  11/18/12 200 lb (90.719 kg)  10/03/12 201 lb (91.173 kg)  09/19/12 201 lb (91.173 kg)  08/25/12 198 lb (89.812 kg)  08/22/12 197 lb (89.359 kg)  08/09/12 196 lb (88.905 kg)  08/02/12 195 lb (88.451 kg)    Body mass index is 29.1 kg/(m^2). Patient meets criteria for overweight based on current BMI.   Current diet order is carb modified, patient is consuming approximately 100% of meals at this time. Labs and medications reviewed.   Pt reported that his weight loss was intentional using diet and exercise.  No nutrition interventions warranted at this time. If nutrition issues arise, please consult RD.   Ebbie Latus RD, LDN

## 2013-10-13 NOTE — Discharge Summary (Signed)
Physician Discharge Summary  Patient ID: Darrell [name]. MRN: [id number] DOB/AGE: May 31, 1946 67 y.o.  Admit date: 10/12/2013 Discharge date: 10/13/2013  Discharge Diagnoses Patient Active Problem List   Diagnosis Date Noted  . Rib fractures 10/13/2013  . Fall 10/13/2013  . Dysplastic nevus of trunk 02/14/2013  . Bicipital tendinitis of right shoulder 07/12/2012  . Subacromial bursitis, right 03/28/2012  . Right spastic hemiparesis 02/02/2012  . Occlusion and stenosis of carotid artery with cerebral infarction 01/26/2012  . Hemiplegia affecting right dominant side 12/24/2011  . CVA (cerebral infarction) 12/15/2011  . Occlusion and stenosis of carotid artery without mention of cerebral infarction 12/08/2011  . Carotid artery plaque 12/04/2011  . Left carotid bruit 11/30/2011  . AUTONOMIC NEUROPATHY, DIABETIC 11/25/2010  . HYPERLIPIDEMIA 03/15/2008  . SLEEP APNEA, OBSTRUCTIVE, SEVERE 07/27/2007  . DIABETES MELLITUS, TYPE II 06/15/2007  . HYPERTENSION 06/15/2007    Consultants None   Procedures None   HPI: [name] got up at home and fell onto his right side. He came in complaining or right chest pain and workup, which included a CT scan of the chest, showed right rib fractures. He was admitted for pain control and pulmonary toilet.   Hospital Course: The patient did well in the hospital. He did not suffer any respiratory compromise from his injury. His pain was controlled on oral medications and he was mobilized with physical therapy who recommended home with outpatient PT. He was discharged there in stable condition.      Medication List         aspirin EC 81 MG tablet  Take 81 mg by mouth daily.     citalopram 10 MG tablet  Commonly known as:  CELEXA  Take 10 mg by mouth daily.     HYDROcodone-acetaminophen 5-325 MG per tablet  Commonly known as:  NORCO/VICODIN  Take 1-2 tablets by mouth every 4 (four) hours as needed (Pain).     lisinopril 10 MG tablet   Commonly known as:  PRINIVIL,ZESTRIL  Take 10 mg by mouth every morning.     metFORMIN 1000 MG tablet  Commonly known as:  GLUCOPHAGE  Take 1,000 mg by mouth 2 (two) times daily with a meal.     tiZANidine 2 MG tablet  Commonly known as:  ZANAFLEX  Take 2 mg by mouth every 8 (eight) hours as needed for muscle spasms.             Follow-up Information   Call [facility]. (As needed)    Contact information:   [address] [place] [address] [phone]       Signed: Freeman Caldron, PA-C Pager: [phone] General Trauma PA Pager: [phone]  10/13/2013, 1:26 PM

## 2013-10-13 NOTE — Progress Notes (Signed)
IV taken out. Personal items given.

## 2013-10-13 NOTE — ED Provider Notes (Signed)
I saw and evaluated the patient, reviewed the resident's note and I agree with the findings and plan.  EKG Interpretation    Date/Time:  Thursday October 12 2013 20:43:07 EST Ventricular Rate:  79 PR Interval:  178 QRS Duration: 124 QT Interval:  408 QTC Calculation: 468 R Axis:   -61 Text Interpretation:  Age not entered, assumed to be  67 years old for purpose of ECG interpretation Sinus rhythm Nonspecific IVCD with LAD Left ventricular hypertrophy No old tracing to compare Confirmed by Imaad Reuss  MD, Alexandr Yaworski ([id number]) on 10/13/2013 5:16:34 PM            Patient with recurrent syncope after standing. EKG ok, labs benign. Xray with rib fractures. Borderline sats and atelectasis evident but no PTX. Will admit to trauma obs for pain control and observation.  Audree Camel, MD 10/13/13 [phone]

## 2013-11-01 ENCOUNTER — Encounter: Payer: Self-pay | Admitting: Internal Medicine

## 2013-11-30 ENCOUNTER — Other Ambulatory Visit: Payer: Self-pay | Admitting: Vascular Surgery

## 2013-11-30 DIAGNOSIS — I6529 Occlusion and stenosis of unspecified carotid artery: Secondary | ICD-10-CM

## 2013-11-30 DIAGNOSIS — Z48812 Encounter for surgical aftercare following surgery on the circulatory system: Secondary | ICD-10-CM

## 2013-12-04 ENCOUNTER — Encounter: Payer: Self-pay | Admitting: Internal Medicine

## 2013-12-11 ENCOUNTER — Encounter: Payer: Medicare Other | Admitting: Family Medicine

## 2013-12-25 ENCOUNTER — Telehealth: Payer: Self-pay | Admitting: Family Medicine

## 2013-12-25 NOTE — Telephone Encounter (Signed)
Patient Information:  Caller Name: Darrell [name]  Phone: [phone]  Patient: Darrell [name]  Gender: Male  DOB: 05/13/1946  Age: 68 Years  PCP: Darrell [name] [facility])  Office Follow Up:  Does the office need to follow up with this patient?: No  Instructions For The Office: N/A  RN Note:  Lump is about 2-3" in diameter.  Patient states it does not hurt.  Spouse has not tried to reduce it; observed it during a bath, but patient states has been present x 2 weeks.  Afebile.  Per abdominal lump protocol, emergent symptoms denied; advised appt within 72 hours.  Appt scheduled 12/28/13 1200 with Dr. Sherren [name], as patient/family prefers to see Dr. Sherren [name].  krs/can  Symptoms  Reason For Call & Symptoms: hernia  Reviewed Health History In EMR: Yes  Reviewed Medications In EMR: Yes  Reviewed Allergies In EMR: Yes  Reviewed Surgeries / Procedures: Yes  Date of Onset of Symptoms: 12/11/2013  Guideline(s) Used:  No Protocol Available - Sick Adult  Disposition Per Guideline:   See Within 3 Days in Office  Reason For Disposition Reached:   Nursing judgment  Advice Given:  N/A  Patient Will Follow Care Advice:  YES  Appointment Scheduled:  12/28/2013 12:00:00 Appointment Scheduled Provider:  Stevie [name] [facility])

## 2013-12-25 NOTE — Telephone Encounter (Signed)
FYI, pt scheduled 3/5 to see Dr. Sherren Mocha

## 2013-12-28 ENCOUNTER — Encounter: Payer: Self-pay | Admitting: Family Medicine

## 2013-12-28 ENCOUNTER — Ambulatory Visit (INDEPENDENT_AMBULATORY_CARE_PROVIDER_SITE_OTHER): Payer: Medicare Other | Admitting: Family Medicine

## 2013-12-28 ENCOUNTER — Other Ambulatory Visit: Payer: Self-pay | Admitting: Family Medicine

## 2013-12-28 VITALS — BP 150/90 | Temp 98.8°F | Wt 210.0 lb

## 2013-12-28 DIAGNOSIS — I6529 Occlusion and stenosis of unspecified carotid artery: Secondary | ICD-10-CM

## 2013-12-28 DIAGNOSIS — K4090 Unilateral inguinal hernia, without obstruction or gangrene, not specified as recurrent: Secondary | ICD-10-CM

## 2013-12-28 NOTE — Patient Instructions (Signed)
Call the surgery Center and make an appointment to see Dr. Donne Hazel or toth for further evaluation

## 2013-12-28 NOTE — Progress Notes (Signed)
   Subjective:    Patient ID: Darrell Cea., male    DOB: 04/19/1946, 68 y.o.   MRN: [id number]  HPI Darrell [name] is a 68 year old married male nonsmoker who comes in today for evaluation of her hernia  He states without any trauma he noticed a couple weeks the bulge in his left groin. It seems to be getting bigger  He's had sinus surgery in the past but is not recall who did it he says many years ago. I don't have electronic record of that. It's probably in his paper chart   Review of Systems Review of systems negative    Objective:   Physical Exam Well-developed well-nourished male no acute distress vital signs stable he is afebrile is a slight bulge in the right groin a very small hernia there.   l reducibleeft groin golf ball sized herLeft inguinal hernia plan surgical consultnia       Assessment & Plan:  Left inguinal hernia surgical consult

## 2014-01-02 ENCOUNTER — Other Ambulatory Visit: Payer: Self-pay | Admitting: Family Medicine

## 2014-01-09 ENCOUNTER — Ambulatory Visit (INDEPENDENT_AMBULATORY_CARE_PROVIDER_SITE_OTHER): Payer: Medicare Other | Admitting: General Surgery

## 2014-01-09 ENCOUNTER — Encounter (INDEPENDENT_AMBULATORY_CARE_PROVIDER_SITE_OTHER): Payer: Self-pay | Admitting: General Surgery

## 2014-01-09 ENCOUNTER — Encounter (HOSPITAL_COMMUNITY): Payer: Self-pay | Admitting: Pharmacy Technician

## 2014-01-09 VITALS — BP 126/80 | HR 74 | Temp 97.4°F | Resp 16 | Ht 70.0 in | Wt 211.0 lb

## 2014-01-09 DIAGNOSIS — K4090 Unilateral inguinal hernia, without obstruction or gangrene, not specified as recurrent: Secondary | ICD-10-CM | POA: Diagnosis not present

## 2014-01-09 NOTE — Progress Notes (Signed)
Patient ID: Darrell [name]., male   DOB: 06-09-1946, 68 y.o.   MRN: [id number]  Chief Complaint  Patient presents with  . Umbilical Hernia    HPI Darrell [name]. is a 68 y.o. male.  The patient is here to evaluate a left inguinal hernia.  HPI The patient has had a previous right inguinal hernia repair years ago. He's had no problems with that. Over the past 3 months he's noticed a bulge in his left inguinal area. He was diagnosed as being an inguinal hernia. It is completely reducible and minimally symptomatic. Past Medical History  Diagnosis Date  . Diabetes mellitus   . Sleep apnea     cpap      sleep study > 4 yrs  . Hypertension     dr Sherren Mocha    Past Surgical History  Procedure Laterality Date  . Hernia repair    . Heel spur surgery    . [place] surgery    . Endarterectomy  12/09/2011    Procedure: ENDARTERECTOMY CAROTID;  Surgeon: Tinnie Gens, MD;  Location: [facility] OR;  Service: Vascular;  Laterality: Left;  Left Carotid endarterectomy with dacron patch angioplasty  . Carotid endarterectomy  12/09/11    Left    Family History  Problem Relation Age of Onset  . Heart disease Father     Social History History  Substance Use Topics  . Smoking status: Former Smoker -- 2.50 packs/day for 30 years    Types: Cigarettes    Quit date: 12/07/2001  . Smokeless tobacco: Never Used  . Alcohol Use: Yes     Comment: socially    Allergies  Allergen Reactions  . Oxycodone-Acetaminophen     REACTION: feels sick (patient reports taking acetaminophen without issue)     Current Outpatient Prescriptions  Medication Sig Dispense Refill  . aspirin EC 81 MG tablet Take 81 mg by mouth daily.      . citalopram (CELEXA) 10 MG tablet take 1 tablet by mouth once daily  100 tablet  3  . lisinopril (PRINIVIL,ZESTRIL) 10 MG tablet take 1 tablet by mouth every morning  100 tablet  3  . metFORMIN (GLUCOPHAGE) 1000 MG tablet take 1 tablet by mouth twice a day with meals  200 tablet  3    No current facility-administered medications for this visit.    Review of Systems Review of Systems  HENT: Negative.   Eyes: Negative.   Respiratory: Negative.   Cardiovascular: Negative.   Gastrointestinal: Negative.   Endocrine: Negative.   Genitourinary: Negative.   Allergic/Immunologic: Negative.   Neurological: Positive for weakness (right side from [place]).  Hematological: Negative.   Psychiatric/Behavioral: Negative.     Blood pressure 126/80, pulse 74, temperature 97.4 F (36.3 C), temperature source Oral, resp. rate 16, height 5\' 10"  (1.778 m), weight 211 lb (95.709 kg).  Physical Exam Physical Exam  Constitutional: He appears well-developed and well-nourished.  HENT:  Head: Normocephalic and atraumatic.  Eyes: Conjunctivae and EOM are normal. Pupils are equal, round, and reactive to light.  Neck: Normal range of motion. Neck supple.  Cardiovascular: Normal rate, regular rhythm and normal heart sounds.   Pulmonary/Chest: Effort normal and breath sounds normal.  Abdominal: Soft. Bowel sounds are increased. A hernia is present. Hernia confirmed positive in the left inguinal area.    Musculoskeletal: Normal range of motion.  Neurological: He is alert.    Data Reviewed I have reviewed the patient's inflammation in the electronic medical record.  Assessment  Reducible left inguinal hernia likely direct.  Diastases recti.    Plan    Until the patient has his left inguinal hernia repair he can ambulate and walk as much as possible. He is minimally symptomatic however the hernia will need to be repaired. We will go ahead and schedule patient for open left femur hernia repair with mesh. The risks and benefits have been explained to the patient and his wife and they wish to proceed soon.          [name] 01/09/2014, 11:43 AM

## 2014-01-15 ENCOUNTER — Encounter (HOSPITAL_COMMUNITY)
Admission: RE | Admit: 2014-01-15 | Discharge: 2014-01-15 | Disposition: A | Discharge status: Home / Self Care | Payer: Medicare Other | Source: Ambulatory Visit | Attending: General Surgery | Admitting: General Surgery

## 2014-01-15 ENCOUNTER — Encounter (HOSPITAL_COMMUNITY): Payer: Self-pay

## 2014-01-15 DIAGNOSIS — Z01812 Encounter for preprocedural laboratory examination: Secondary | ICD-10-CM | POA: Insufficient documentation

## 2014-01-15 HISTORY — DX: Cerebral infarction, unspecified: I63.9

## 2014-01-15 LAB — CBC WITH DIFFERENTIAL/PLATELET
Basophils Absolute: 0 10*3/uL (ref 0.0–0.1)
Basophils Relative: 0 % (ref 0–1)
Eosinophils Absolute: 0.2 10*3/uL (ref 0.0–0.7)
Eosinophils Relative: 2 % (ref 0–5)
HCT: 44.8 % (ref 39.0–52.0)
Hemoglobin: 15.3 g/dL (ref 13.0–17.0)
LYMPHS ABS: 1.6 10*3/uL (ref 0.7–4.0)
LYMPHS PCT: 22 % (ref 12–46)
MCH: 30.4 pg (ref 26.0–34.0)
MCHC: 34.2 g/dL (ref 30.0–36.0)
MCV: 88.9 fL (ref 78.0–100.0)
Monocytes Absolute: 0.7 10*3/uL (ref 0.1–1.0)
Monocytes Relative: 10 % (ref 3–12)
NEUTROS ABS: 4.9 10*3/uL (ref 1.7–7.7)
NEUTROS PCT: 67 % (ref 43–77)
PLATELETS: 180 10*3/uL (ref 150–400)
RBC: 5.04 MIL/uL (ref 4.22–5.81)
RDW: 13.6 % (ref 11.5–15.5)
WBC: 7.4 10*3/uL (ref 4.0–10.5)

## 2014-01-15 LAB — BASIC METABOLIC PANEL
BUN: 14 mg/dL (ref 6–23)
CALCIUM: 9.1 mg/dL (ref 8.4–10.5)
CO2: 28 meq/L (ref 19–32)
Chloride: 102 mEq/L (ref 96–112)
Creatinine, Ser: 0.73 mg/dL (ref 0.50–1.35)
GFR calc non Af Amer: >90 mL/min (ref >90)
Glucose, Bld: 125 mg/dL — ABNORMAL HIGH (ref 70–99)
Potassium: 4.2 mEq/L (ref 3.7–5.3)
SODIUM: 141 meq/L (ref 137–147)

## 2014-01-15 NOTE — Pre-Procedure Instructions (Signed)
Darrell [name].  01/15/2014   Your procedure is scheduled on:  01/22/14  Report to [place]  2 * 3 at 9 AM.  Call this number if you have problems the morning of surgery: [phone]   Remember:   Do not eat food or drink liquids after midnight.   Take these medicines the morning of surgery with A SIP OF WATER: celexa   Do not wear jewelry, make-up or nail polish.  Do not wear lotions, powders, or perfumes. You may wear deodorant.  Do not shave 48 hours prior to surgery. Men may shave face and neck.  Do not bring valuables to the hospital.  [facility] is not responsible                  for any belongings or valuables.               Contacts, dentures or bridgework may not be worn into surgery.  Leave suitcase in the car. After surgery it may be brought to your room.  For patients admitted to the hospital, discharge time is determined by your                treatment team.               Patients discharged the day of surgery will not be allowed to drive  home.  Name and phone number of your driver: family  Special Instructions: Shower using CHG 2 nights before surgery and the night before surgery.  If you shower the day of surgery use CHG.  Use special wash - you have one bottle of CHG for all showers.  You should use approximately 1/3 of the bottle for each shower.   Please read over the following fact sheets that you were given: Pain Booklet, Coughing and Deep Breathing and Surgical Site Infection Prevention

## 2014-01-21 MED ORDER — CEFAZOLIN SODIUM-DEXTROSE 2-3 GM-% IV SOLR: 2.0000 g | INTRAVENOUS | Status: AC

## 2014-01-21 MED ORDER — CHLORHEXIDINE GLUCONATE 4 % EX LIQD
1.0000 | Freq: Once | CUTANEOUS | Status: DC
Start: 2014-01-21 — End: 2014-01-22
  Filled 2014-01-21: qty 15

## 2014-01-22 ENCOUNTER — Encounter (HOSPITAL_COMMUNITY): Payer: Medicare Other | Admitting: Certified Registered"

## 2014-01-22 ENCOUNTER — Ambulatory Visit (HOSPITAL_COMMUNITY): Payer: Medicare Other | Admitting: Certified Registered"

## 2014-01-22 ENCOUNTER — Ambulatory Visit (HOSPITAL_COMMUNITY)
Admission: RE | Admit: 2014-01-22 | Discharge: 2014-01-22 | Disposition: A | Discharge status: Home / Self Care | Payer: Medicare Other | Source: Ambulatory Visit | Attending: General Surgery | Admitting: General Surgery

## 2014-01-22 ENCOUNTER — Encounter (HOSPITAL_COMMUNITY)
Admission: RE | Disposition: A | Discharge status: Home / Self Care | Payer: Self-pay | Source: Ambulatory Visit | Attending: General Surgery

## 2014-01-22 ENCOUNTER — Encounter (HOSPITAL_COMMUNITY): Payer: Self-pay | Admitting: Surgery

## 2014-01-22 DIAGNOSIS — I10 Essential (primary) hypertension: Secondary | ICD-10-CM | POA: Insufficient documentation

## 2014-01-22 DIAGNOSIS — M6200 Separation of muscle (nontraumatic), unspecified site: Secondary | ICD-10-CM | POA: Diagnosis not present

## 2014-01-22 DIAGNOSIS — E119 Type 2 diabetes mellitus without complications: Secondary | ICD-10-CM | POA: Insufficient documentation

## 2014-01-22 DIAGNOSIS — Z87891 Personal history of nicotine dependence: Secondary | ICD-10-CM | POA: Diagnosis not present

## 2014-01-22 DIAGNOSIS — I739 Peripheral vascular disease, unspecified: Secondary | ICD-10-CM | POA: Diagnosis not present

## 2014-01-22 DIAGNOSIS — Z7982 Long term (current) use of aspirin: Secondary | ICD-10-CM | POA: Diagnosis not present

## 2014-01-22 DIAGNOSIS — G4730 Sleep apnea, unspecified: Secondary | ICD-10-CM | POA: Diagnosis not present

## 2014-01-22 DIAGNOSIS — I69959 Hemiplegia and hemiparesis following unspecified cerebrovascular disease affecting unspecified side: Secondary | ICD-10-CM | POA: Insufficient documentation

## 2014-01-22 DIAGNOSIS — K4090 Unilateral inguinal hernia, without obstruction or gangrene, not specified as recurrent: Secondary | ICD-10-CM | POA: Diagnosis not present

## 2014-01-22 HISTORY — PX: INSERTION OF MESH: SHX5868

## 2014-01-22 HISTORY — PX: INGUINAL HERNIA REPAIR: SHX194

## 2014-01-22 LAB — GLUCOSE, CAPILLARY
GLUCOSE-CAPILLARY: 146 mg/dL — AB (ref 70–99)
Glucose-Capillary: 120 mg/dL — ABNORMAL HIGH (ref 70–99)
Glucose-Capillary: 96 mg/dL (ref 70–99)
Glucose-Capillary: 159 mg/dL — ABNORMAL HIGH (ref 70–99)

## 2014-01-22 SURGERY — REPAIR, HERNIA, INGUINAL, ADULT
Anesthesia: General | Site: Groin | Laterality: Left

## 2014-01-22 MED ORDER — OXYCODONE HCL 5 MG PO TABS: 5.0000 mg | ORAL_TABLET | Freq: Once | ORAL | Status: DC | PRN

## 2014-01-22 MED ORDER — FENTANYL CITRATE 0.05 MG/ML IJ SOLN
INTRAMUSCULAR | Status: AC
  Filled 2014-01-22: qty 5

## 2014-01-22 MED ORDER — BUPIVACAINE HCL (PF) 0.25 % IJ SOLN
INTRAMUSCULAR | Status: AC
  Filled 2014-01-22: qty 30

## 2014-01-22 MED ORDER — HYDROMORPHONE HCL PF 1 MG/ML IJ SOLN: 0.2500 mg | INTRAMUSCULAR | Status: DC | PRN

## 2014-01-22 MED ORDER — EPHEDRINE SULFATE 50 MG/ML IJ SOLN
INTRAMUSCULAR | Status: DC | PRN
  Administered 2014-01-22: 5 mg via INTRAVENOUS
  Administered 2014-01-22 (×2): 10 mg via INTRAVENOUS

## 2014-01-22 MED ORDER — 0.9 % SODIUM CHLORIDE (POUR BTL) OPTIME
TOPICAL | Status: DC | PRN
  Administered 2014-01-22: 1000 mL

## 2014-01-22 MED ORDER — ARTIFICIAL TEARS OP OINT
TOPICAL_OINTMENT | OPHTHALMIC | Status: DC | PRN
  Administered 2014-01-22: 1 via OPHTHALMIC

## 2014-01-22 MED ORDER — LIDOCAINE HCL (CARDIAC) 20 MG/ML IV SOLN
INTRAVENOUS | Status: DC | PRN
  Administered 2014-01-22: 20 mg via INTRAVENOUS

## 2014-01-22 MED ORDER — BUPIVACAINE HCL (PF) 0.25 % IJ SOLN
INTRAMUSCULAR | Status: DC | PRN
  Administered 2014-01-22: 28 mL

## 2014-01-22 MED ORDER — FENTANYL CITRATE 0.05 MG/ML IJ SOLN
INTRAMUSCULAR | Status: DC | PRN
  Administered 2014-01-22 (×2): 50 ug via INTRAVENOUS
  Administered 2014-01-22: 150 ug via INTRAVENOUS

## 2014-01-22 MED ORDER — LACTATED RINGERS IV SOLN
INTRAVENOUS | Status: DC
  Administered 2014-01-22 (×2): via INTRAVENOUS

## 2014-01-22 MED ORDER — PROMETHAZINE HCL 25 MG/ML IJ SOLN
6.2500 mg | INTRAMUSCULAR | Status: DC | PRN
  Administered 2014-01-22: 6.25 mg via INTRAVENOUS

## 2014-01-22 MED ORDER — ARTIFICIAL TEARS OP OINT
TOPICAL_OINTMENT | OPHTHALMIC | Status: AC
  Filled 2014-01-22: qty 3.5

## 2014-01-22 MED ORDER — HYDROCODONE-ACETAMINOPHEN 5-325 MG PO TABS: 1.0000 | ORAL_TABLET | ORAL | Status: DC | PRN

## 2014-01-22 MED ORDER — GLYCOPYRROLATE 0.2 MG/ML IJ SOLN
INTRAMUSCULAR | Status: AC
  Filled 2014-01-22: qty 1

## 2014-01-22 MED ORDER — MEPERIDINE HCL 25 MG/ML IJ SOLN: 6.2500 mg | INTRAMUSCULAR | Status: DC | PRN

## 2014-01-22 MED ORDER — ONDANSETRON HCL 4 MG/2ML IJ SOLN
INTRAMUSCULAR | Status: AC
  Filled 2014-01-22: qty 2

## 2014-01-22 MED ORDER — LIDOCAINE HCL (CARDIAC) 20 MG/ML IV SOLN
INTRAVENOUS | Status: AC
  Filled 2014-01-22: qty 5

## 2014-01-22 MED ORDER — PROMETHAZINE HCL 25 MG/ML IJ SOLN
INTRAMUSCULAR | Status: AC
  Filled 2014-01-22: qty 1

## 2014-01-22 MED ORDER — SODIUM CHLORIDE 0.9 % IR SOLN
Status: DC | PRN
  Administered 2014-01-22: 12:00:00

## 2014-01-22 MED ORDER — OXYCODONE HCL 5 MG/5ML PO SOLN: 5.0000 mg | Freq: Once | ORAL | Status: DC | PRN

## 2014-01-22 MED ORDER — MIDAZOLAM HCL 2 MG/2ML IJ SOLN
INTRAMUSCULAR | Status: AC
  Filled 2014-01-22: qty 2

## 2014-01-22 MED ORDER — GLYCOPYRROLATE 0.2 MG/ML IJ SOLN
INTRAMUSCULAR | Status: DC | PRN
  Administered 2014-01-22: .8 mg via INTRAVENOUS

## 2014-01-22 MED ORDER — HYDROMORPHONE HCL PF 1 MG/ML IJ SOLN
INTRAMUSCULAR | Status: AC
  Filled 2014-01-22: qty 1

## 2014-01-22 MED ORDER — PROPOFOL 10 MG/ML IV BOLUS
INTRAVENOUS | Status: DC | PRN
  Administered 2014-01-22: 150 mg via INTRAVENOUS
  Administered 2014-01-22: 50 mg via INTRAVENOUS

## 2014-01-22 MED ORDER — CEFAZOLIN SODIUM-DEXTROSE 2-3 GM-% IV SOLR
INTRAVENOUS | Status: AC
  Administered 2014-01-22: 2 g via INTRAVENOUS
  Filled 2014-01-22: qty 50

## 2014-01-22 MED ORDER — GLYCOPYRROLATE 0.2 MG/ML IJ SOLN
INTRAMUSCULAR | Status: AC
Start: 2014-01-22 — End: 2014-01-22
  Filled 2014-01-22: qty 3

## 2014-01-22 MED ORDER — ROCURONIUM BROMIDE 100 MG/10ML IV SOLN
INTRAVENOUS | Status: DC | PRN
  Administered 2014-01-22: 5 mg via INTRAVENOUS
  Administered 2014-01-22: 40 mg via INTRAVENOUS

## 2014-01-22 MED ORDER — PROPOFOL 10 MG/ML IV BOLUS
INTRAVENOUS | Status: AC
  Filled 2014-01-22: qty 20

## 2014-01-22 MED ORDER — ONDANSETRON HCL 4 MG/2ML IJ SOLN
INTRAMUSCULAR | Status: DC | PRN
  Administered 2014-01-22: 4 mg via INTRAVENOUS

## 2014-01-22 MED ORDER — NEOSTIGMINE METHYLSULFATE 1 MG/ML IJ SOLN
INTRAMUSCULAR | Status: DC | PRN
  Administered 2014-01-22: 5 mg via INTRAVENOUS

## 2014-01-22 SURGICAL SUPPLY — 58 items
ADH SKN CLS APL DERMABOND .7 (GAUZE/BANDAGES/DRESSINGS) ×1
BAG DECANTER FOR FLEXI CONT (MISCELLANEOUS) ×3 IMPLANT
BLADE SURG 10 STRL SS (BLADE) ×3 IMPLANT
BLADE SURG 15 STRL LF DISP TIS (BLADE) ×1 IMPLANT
BLADE SURG 15 STRL SS (BLADE) ×3
BLADE SURG ROTATE 9660 (MISCELLANEOUS) ×2 IMPLANT
CANISTER SUCTION 2500CC (MISCELLANEOUS) IMPLANT
CHLORAPREP W/TINT 26ML (MISCELLANEOUS) ×3 IMPLANT
CLEANER TIP ELECTROSURG 2X2 (MISCELLANEOUS) ×3 IMPLANT
CLOSURE WOUND 1/2 X4 (GAUZE/BANDAGES/DRESSINGS) ×1
COVER SURGICAL LIGHT HANDLE (MISCELLANEOUS) ×3 IMPLANT
DERMABOND ADVANCED (GAUZE/BANDAGES/DRESSINGS) ×2
DERMABOND ADVANCED .7 DNX12 (GAUZE/BANDAGES/DRESSINGS) ×1 IMPLANT
DRAIN PENROSE 1/2X12 LTX STRL (WOUND CARE) ×2 IMPLANT
DRAPE LAPAROTOMY TRNSV 102X78 (DRAPE) ×3 IMPLANT
DRAPE UTILITY 15X26 W/TAPE STR (DRAPE) ×6 IMPLANT
DRSG TEGADERM 4X4.75 (GAUZE/BANDAGES/DRESSINGS) ×3 IMPLANT
ELECT REM PT RETURN 9FT ADLT (ELECTROSURGICAL) ×3
ELECTRODE REM PT RTRN 9FT ADLT (ELECTROSURGICAL) ×1 IMPLANT
GAUZE SPONGE 4X4 16PLY XRAY LF (GAUZE/BANDAGES/DRESSINGS) ×3 IMPLANT
GLOVE BIO SURGEON STRL SZ7 (GLOVE) ×2 IMPLANT
GLOVE BIOGEL PI IND STRL 7.0 (GLOVE) IMPLANT
GLOVE BIOGEL PI IND STRL 7.5 (GLOVE) IMPLANT
GLOVE BIOGEL PI IND STRL 8 (GLOVE) ×1 IMPLANT
GLOVE BIOGEL PI INDICATOR 7.0 (GLOVE) ×2
GLOVE BIOGEL PI INDICATOR 7.5 (GLOVE) ×2
GLOVE BIOGEL PI INDICATOR 8 (GLOVE) ×2
GLOVE ECLIPSE 7.5 STRL STRAW (GLOVE) ×5 IMPLANT
GLOVE SS BIOGEL STRL SZ 6.5 (GLOVE) IMPLANT
GLOVE SUPERSENSE BIOGEL SZ 6.5 (GLOVE) ×2
GOWN STRL REUS W/ TWL LRG LVL3 (GOWN DISPOSABLE) ×2 IMPLANT
GOWN STRL REUS W/TWL LRG LVL3 (GOWN DISPOSABLE) ×9
KIT BASIN OR (CUSTOM PROCEDURE TRAY) ×3 IMPLANT
KIT ROOM TURNOVER OR (KITS) ×3 IMPLANT
MESH HERNIA 3X6 (Mesh General) ×2 IMPLANT
NDL HYPO 25GX1X1/2 BEV (NEEDLE) ×1 IMPLANT
NEEDLE HYPO 25GX1X1/2 BEV (NEEDLE) ×3 IMPLANT
NS IRRIG 1000ML POUR BTL (IV SOLUTION) ×3 IMPLANT
PACK SURGICAL SETUP 50X90 (CUSTOM PROCEDURE TRAY) ×3 IMPLANT
PAD ARMBOARD 7.5X6 YLW CONV (MISCELLANEOUS) ×3 IMPLANT
PENCIL BUTTON HOLSTER BLD 10FT (ELECTRODE) ×3 IMPLANT
SPECIMEN JAR SMALL (MISCELLANEOUS) IMPLANT
SPONGE INTESTINAL PEANUT (DISPOSABLE) ×3 IMPLANT
SPONGE LAP 18X18 X RAY DECT (DISPOSABLE) ×3 IMPLANT
STRIP CLOSURE SKIN 1/2X4 (GAUZE/BANDAGES/DRESSINGS) ×2 IMPLANT
SUT ETHIBOND 0 MO6 C/R (SUTURE) ×3 IMPLANT
SUT MON AB 4-0 PC3 18 (SUTURE) ×3 IMPLANT
SUT PROLENE 0 CT 2 (SUTURE) ×6 IMPLANT
SUT VIC AB 3-0 SH 27 (SUTURE) ×6
SUT VIC AB 3-0 SH 27X BRD (SUTURE) ×2 IMPLANT
SUT VICRYL AB 3 0 TIES (SUTURE) ×3 IMPLANT
SYR BULB 3OZ (MISCELLANEOUS) ×3 IMPLANT
SYR CONTROL 10ML LL (SYRINGE) ×3 IMPLANT
TOWEL OR 17X24 6PK STRL BLUE (TOWEL DISPOSABLE) ×3 IMPLANT
TOWEL OR 17X26 10 PK STRL BLUE (TOWEL DISPOSABLE) ×3 IMPLANT
TUBE CONNECTING 12'X1/4 (SUCTIONS)
TUBE CONNECTING 12X1/4 (SUCTIONS) IMPLANT
YANKAUER SUCT BULB TIP NO VENT (SUCTIONS) IMPLANT

## 2014-01-22 NOTE — Anesthesia Procedure Notes (Signed)
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

## 2014-01-22 NOTE — Interval H&P Note (Signed)
History and Physical Interval Note:  01/22/2014 11:11 AM  Darrell [name]  has presented today for surgery, with the diagnosis of Symptomatic left inguinal hernia  The various methods of treatment have been discussed with the patient and family. After consideration of risks, benefits and other options for treatment, the patient has consented to  Procedure(s): LEFT INGUINAL HERNIA REPAIR WITH MESH (Left) INSERTION OF MESH (Left) as a surgical intervention .  [name] history has been reviewed, patient examined, no change in status, stable for surgery.  I have reviewed the patient's chart and labs.  Questions were answered to the patient's satisfaction.   No more symptoms.  Conswella Bruney, Kathryne Eriksson

## 2014-01-22 NOTE — Progress Notes (Signed)
Report given to Johny RN  

## 2014-01-22 NOTE — Progress Notes (Signed)
Rec'd report from [facility], assuming care for lunch coverage

## 2014-01-22 NOTE — Preoperative (Signed)
Beta Blockers   Reason not to administer Beta Blockers:Not Applicable 

## 2014-01-22 NOTE — H&P (View-Only) (Signed)
Patient ID: Darrell [name]., male   DOB: 06-09-1946, 68 y.o.   MRN: [id number]  Chief Complaint  Patient presents with  . Umbilical Hernia    HPI Darrell [name]. is a 68 y.o. male.  The patient is here to evaluate a left inguinal hernia.  HPI The patient has had a previous right inguinal hernia repair years ago. He's had no problems with that. Over the past 3 months he's noticed a bulge in his left inguinal area. He was diagnosed as being an inguinal hernia. It is completely reducible and minimally symptomatic. Past Medical History  Diagnosis Date  . Diabetes mellitus   . Sleep apnea     cpap      sleep study > 4 yrs  . Hypertension     dr Sherren Mocha    Past Surgical History  Procedure Laterality Date  . Hernia repair    . Heel spur surgery    . [place] surgery    . Endarterectomy  12/09/2011    Procedure: ENDARTERECTOMY CAROTID;  Surgeon: Tinnie Gens, MD;  Location: [facility] OR;  Service: Vascular;  Laterality: Left;  Left Carotid endarterectomy with dacron patch angioplasty  . Carotid endarterectomy  12/09/11    Left    Family History  Problem Relation Age of Onset  . Heart disease Father     Social History History  Substance Use Topics  . Smoking status: Former Smoker -- 2.50 packs/day for 30 years    Types: Cigarettes    Quit date: 12/07/2001  . Smokeless tobacco: Never Used  . Alcohol Use: Yes     Comment: socially    Allergies  Allergen Reactions  . Oxycodone-Acetaminophen     REACTION: feels sick (patient reports taking acetaminophen without issue)     Current Outpatient Prescriptions  Medication Sig Dispense Refill  . aspirin EC 81 MG tablet Take 81 mg by mouth daily.      . citalopram (CELEXA) 10 MG tablet take 1 tablet by mouth once daily  100 tablet  3  . lisinopril (PRINIVIL,ZESTRIL) 10 MG tablet take 1 tablet by mouth every morning  100 tablet  3  . metFORMIN (GLUCOPHAGE) 1000 MG tablet take 1 tablet by mouth twice a day with meals  200 tablet  3    No current facility-administered medications for this visit.    Review of Systems Review of Systems  HENT: Negative.   Eyes: Negative.   Respiratory: Negative.   Cardiovascular: Negative.   Gastrointestinal: Negative.   Endocrine: Negative.   Genitourinary: Negative.   Allergic/Immunologic: Negative.   Neurological: Positive for weakness (right side from [place]).  Hematological: Negative.   Psychiatric/Behavioral: Negative.     Blood pressure 126/80, pulse 74, temperature 97.4 F (36.3 C), temperature source Oral, resp. rate 16, height 5\' 10"  (1.778 m), weight 211 lb (95.709 kg).  Physical Exam Physical Exam  Constitutional: He appears well-developed and well-nourished.  HENT:  Head: Normocephalic and atraumatic.  Eyes: Conjunctivae and EOM are normal. Pupils are equal, round, and reactive to light.  Neck: Normal range of motion. Neck supple.  Cardiovascular: Normal rate, regular rhythm and normal heart sounds.   Pulmonary/Chest: Effort normal and breath sounds normal.  Abdominal: Soft. Bowel sounds are increased. A hernia is present. Hernia confirmed positive in the left inguinal area.    Musculoskeletal: Normal range of motion.  Neurological: He is alert.    Data Reviewed I have reviewed the patient's inflammation in the electronic medical record.  Assessment  Reducible left inguinal hernia likely direct.  Diastases recti.    Plan    Until the patient has his left inguinal hernia repair he can ambulate and walk as much as possible. He is minimally symptomatic however the hernia will need to be repaired. We will go ahead and schedule patient for open left femur hernia repair with mesh. The risks and benefits have been explained to the patient and his wife and they wish to proceed soon.          [name] 01/09/2014, 11:43 AM

## 2014-01-22 NOTE — Anesthesia Preprocedure Evaluation (Addendum)
Anesthesia Evaluation  Patient identified by MRN, date of birth, ID band Patient awake    Reviewed: Allergy & Precautions, H&P , NPO status , Patient's Chart, lab work & pertinent test results  History of Anesthesia Complications Negative for: history of anesthetic complications  Airway Mallampati: II TM Distance: >3 FB Neck ROM: Full    Dental  (+) Teeth Intact, Dental Advisory Given   Pulmonary sleep apnea and Continuous Positive Airway Pressure Ventilation , former smoker (quit '03),  breath sounds clear to auscultation  Pulmonary exam normal       Cardiovascular hypertension, Pt. on medications - angina+ Peripheral Vascular Disease (s/p CEA) Rhythm:Regular Rate:Normal     Neuro/Psych CVA (R hemiparesis), Residual Symptoms    GI/Hepatic negative GI ROS, Neg liver ROS,   Endo/Other  diabetes (glu 120), Oral Hypoglycemic AgentsMorbid obesity  Renal/GU negative Renal ROS     Musculoskeletal   Abdominal (+) + obese,   Peds  Hematology   Anesthesia Other Findings   Reproductive/Obstetrics                          Anesthesia Physical Anesthesia Plan  ASA: III  Anesthesia Plan: General   Post-op Pain Management:    Induction: Intravenous  Airway Management Planned: Oral ETT  Additional Equipment:   Intra-op Plan:   Post-operative Plan: Extubation in OR  Informed Consent:   Dental advisory given  Plan Discussed with: CRNA and Surgeon  Anesthesia Plan Comments: (Plan routine monitors, GETA)        Anesthesia Quick Evaluation

## 2014-01-22 NOTE — Op Note (Signed)
OPERATIVE REPORT  DATE OF OPERATION: 01/22/2014  PATIENT:  Darrell [name]  68 y.o. male  PRE-OPERATIVE DIAGNOSIS:  Symptomatic left inguinal hernia  POST-OPERATIVE DIAGNOSIS:  Symptomatic left inguinal hernia, Indirect and direct  PROCEDURE:  Procedure(s): LEFT INGUINAL HERNIA REPAIR WITH MESH INSERTION OF MESH  SURGEON:  Surgeon(s): [name], MD  ASSISTANT: None  ANESTHESIA:   general  EBL: <20 ml  BLOOD ADMINISTERED: none  DRAINS: none   SPECIMEN:  No Specimen  COUNTS CORRECT:  YES  PROCEDURE DETAILS: The patient was taken to the operating room and placed on the table in the supine position. After an adequate general endotracheal anesthetic was administered he was prepped and draped in the usual sterile manner exposing his left inguinal area.  A proper time out was performed identifying the patient and the procedure to be performed. A left inguinal transverse curvilinear incision approximately 10 cm long was made using a #10 blade. It was taken down to subcutaneous tissue to the external oblique fascia to Scarpa's fascia using electrocautery.  Once we down to the external oblique fascia we opened the fascia along its fibers down through the superficial ring. We then mobilized the spermatic cord at the pubic tubercle encircling it with a Penrose drain. Once this was done we do see a large indirect hernia sac associated with the spermatic cord.  We dissected the indirect sac away from the spermatic cord and opened it we do see the omental tongue that was inside the sac. We then suture ligated the base of the sac using 0 Ethibond suture x2. We trimmed off the excess hernia sac using Metzenbaum scissors.  Once this was done it was noted that the patient had a weakness in the direct floor a small direct sac. We then imbricated that sac on itself using interrupted 0 Ethibond sutures then repaired the floor using an oval piece of polypropylene mesh measuring approximately 6  x 4 cm in size. We attached it to the pubic tubercle, the reflected portion of the inguinal ligament, and the conjoined tendon and transversalis fascia using running 0 Prolene suture.  Once this was done the mesh, which had been soaked in antibiotic solution prior to being implanted, was irrigated with antibiotic solution. We then closed the external oblique fascia on top of the spermatic cord using running 3-0 Vicryl suture. Once this was done Scarpa's fascia was reapproximated using interrupted 3-0 Vicryl sutures. Cortisone Marcaine without epinephrine was injected into subcutaneous tissue. A total of 30 cc of solution was used also injected at the anterior superior iliac spine.  We closed the skin using a running subcuticular stitch of 4-0 Monocryl. Dermabond Steri-Strips and Tegaderms used to complete the dressings. All counts were correct including needles, sponges, and instruments.  PATIENT DISPOSITION:  PACU - hemodynamically stable.   Lucinda Spells O 3/30/20151:10 PM

## 2014-01-22 NOTE — Discharge Instructions (Signed)

## 2014-01-22 NOTE — Anesthesia Postprocedure Evaluation (Signed)
  Anesthesia Post-op Note  Patient: Darrell [name]  Procedure(s) Performed: Procedure(s): LEFT INGUINAL HERNIA REPAIR WITH MESH (Left) INSERTION OF MESH (Left)  Patient Location: PACU  Anesthesia Type:General  Level of Consciousness: awake, alert , oriented and patient cooperative  Airway and Oxygen Therapy: Patient Spontanous Breathing and Patient connected to nasal cannula oxygen  Post-op Pain: mild  Post-op Assessment: Post-op Vital signs reviewed, Patient's Cardiovascular Status Stable, Respiratory Function Stable, Patent Airway, No signs of Nausea or vomiting and Pain level controlled  Post-op Vital Signs: Reviewed and stable  Complications: No apparent anesthesia complications

## 2014-01-22 NOTE — Transfer of Care (Signed)
Immediate Anesthesia Transfer of Care Note  Patient: Darrell [name]  Procedure(s) Performed: Procedure(s): LEFT INGUINAL HERNIA REPAIR WITH MESH (Left) INSERTION OF MESH (Left)  Patient Location: PACU  Anesthesia Type:General  Level of Consciousness: awake, alert  and oriented  Airway & Oxygen Therapy: Patient Spontanous Breathing and Patient connected to face mask oxygen  Post-op Assessment: Report given to PACU RN, Post -op Vital signs reviewed and stable and Patient moving all extremities X 4  Post vital signs: Reviewed and stable  Complications: No apparent anesthesia complications

## 2014-01-23 ENCOUNTER — Encounter: Payer: Medicare Other | Admitting: Family Medicine

## 2014-01-23 ENCOUNTER — Encounter (HOSPITAL_COMMUNITY): Payer: Self-pay | Admitting: General Surgery

## 2014-01-29 ENCOUNTER — Telehealth: Payer: Self-pay | Admitting: *Deleted

## 2014-01-29 NOTE — Telephone Encounter (Signed)
Dr. Olevia Perches,  Please review this pt's chart- he had an inguinal hernia repair on 01-22-14.  His colonoscopy is 02-09-14.  Do you want him to wait until a later time or go ahead and have procedure done as scheduled?  Thanks, [employer]

## 2014-01-29 NOTE — Telephone Encounter (Signed)
Please wait 6 weeks from surgery before scheduling colonoscopy

## 2014-01-30 NOTE — Telephone Encounter (Signed)
Pt's wife returned call.Informed her that Dr Olevia Perches would like the pt to wait 6 weeks post-op before having his colonoscopy. She understood. She will call back to reschedule his pre-visit and colonoscopy.The pre-visit for today was cancelled and colonoscopy appt on 02/09/14 was cancelled.

## 2014-01-30 NOTE — Telephone Encounter (Signed)
Called pt and left message to call our office regarding his appt today.

## 2014-02-06 ENCOUNTER — Ambulatory Visit (INDEPENDENT_AMBULATORY_CARE_PROVIDER_SITE_OTHER): Payer: Medicare Other | Admitting: General Surgery

## 2014-02-06 ENCOUNTER — Encounter (INDEPENDENT_AMBULATORY_CARE_PROVIDER_SITE_OTHER): Payer: Self-pay | Admitting: General Surgery

## 2014-02-06 VITALS — BP 140/90 | HR 73 | Temp 96.9°F | Resp 16 | Ht 71.0 in | Wt 209.2 lb

## 2014-02-06 DIAGNOSIS — Z09 Encounter for follow-up examination after completed treatment for conditions other than malignant neoplasm: Secondary | ICD-10-CM | POA: Insufficient documentation

## 2014-02-06 NOTE — Progress Notes (Signed)
Subjective:     Patient ID: Darrell [name], male   DOB: 19-Jun-1946, 68 y.o.   MRN: [id number]  HPI Doing well status post open left inguinal hernia repair.  Review of Systems Eating well. Minimal symptoms. Minimal pain.    Objective:   Physical Exam No recurrence of hernia. He had some swelling around the incision site only. Minimal discomfort. Testicle appears to be normal.    Assessment:     Doing well status post open left internal hernia repair     Plan:     Return to see me on an as-needed basis.

## 2014-02-09 ENCOUNTER — Encounter: Payer: Medicare Other | Admitting: Internal Medicine

## 2014-02-12 ENCOUNTER — Encounter: Payer: Self-pay | Admitting: Family

## 2014-02-13 ENCOUNTER — Ambulatory Visit (HOSPITAL_COMMUNITY)
Admission: RE | Admit: 2014-02-13 | Discharge: 2014-02-13 | Disposition: A | Discharge status: Home / Self Care | Payer: Medicare Other | Source: Ambulatory Visit | Attending: Vascular Surgery | Admitting: Vascular Surgery

## 2014-02-13 ENCOUNTER — Ambulatory Visit (INDEPENDENT_AMBULATORY_CARE_PROVIDER_SITE_OTHER): Payer: Medicare Other | Admitting: Family

## 2014-02-13 ENCOUNTER — Ambulatory Visit: Payer: Medicare Other | Admitting: Vascular Surgery

## 2014-02-13 ENCOUNTER — Encounter (INDEPENDENT_AMBULATORY_CARE_PROVIDER_SITE_OTHER): Payer: Self-pay

## 2014-02-13 ENCOUNTER — Encounter: Payer: Self-pay | Admitting: Family

## 2014-02-13 ENCOUNTER — Other Ambulatory Visit (HOSPITAL_COMMUNITY): Payer: Medicare Other

## 2014-02-13 ENCOUNTER — Encounter (INDEPENDENT_AMBULATORY_CARE_PROVIDER_SITE_OTHER): Payer: Medicare Other | Admitting: General Surgery

## 2014-02-13 VITALS — BP 176/94 | HR 68 | Resp 18 | Ht 71.0 in | Wt 209.0 lb

## 2014-02-13 DIAGNOSIS — I63239 Cerebral infarction due to unspecified occlusion or stenosis of unspecified carotid arteries: Secondary | ICD-10-CM

## 2014-02-13 DIAGNOSIS — Z48812 Encounter for surgical aftercare following surgery on the circulatory system: Secondary | ICD-10-CM | POA: Insufficient documentation

## 2014-02-13 DIAGNOSIS — I6529 Occlusion and stenosis of unspecified carotid artery: Secondary | ICD-10-CM | POA: Diagnosis not present

## 2014-02-13 NOTE — Progress Notes (Signed)
Established Carotid Patient   History of Present Illness  Darrell [name] is a 68 y.o. male patient of Dr. Kellie Simmering who returns for continued followup regarding his carotid occlusive disease. He underwent left carotid endarterectomy in February of 2013 which acutely thrombosed a few hours postoperatively and required return to the OR with insertion of a common to internal carotid saphenous vein graft. He suffered a left brain CVA. He has had significant improvement in his neurologic status. He returns for follow up. He had a uvulectomy for OSA.   The patient denies amaurosis fugax or monocular blindness.  The patient  denies facial drooping.  Pt. Reports right hemiplegia.  The patient denies receptive or expressive aphasia.  Pt. Denies right lower extremity weakness.  [name] previous neurologic deficits are Unchanged.   reports New Medical or Surgical History: left  inguinal hernia repair about 3 weeks ago.  Pt Diabetic: Yes, in control, able to get off insulin Pt smoker: former smoker, quit in 2005  Pt meds include: Statin : No: , wife states he has never taken, states he was in a study at [facility] about 10 years ago, had a very low cholesterol ASA: Yes Other anticoagulants/antiplatelets: no   Past Medical History  Diagnosis Date  . Diabetes mellitus   . Sleep apnea     cpap      sleep study > 4 yrs  . Stroke     rt side weakness  . Hypertension     dr todd    Social History History  Substance Use Topics  . Smoking status: Former Smoker -- 2.50 packs/day for 30 years    Types: Cigarettes    Quit date: 12/07/2001  . Smokeless tobacco: Never Used  . Alcohol Use: Yes     Comment: socially    Family History Family History  Problem Relation Age of Onset  . Heart disease Father     Surgical History Past Surgical History  Procedure Laterality Date  . Heel spur surgery    . [place] surgery    . Endarterectomy  12/09/2011    Procedure: ENDARTERECTOMY CAROTID;   Surgeon: Tinnie Gens, MD;  Location: [facility] OR;  Service: Vascular;  Laterality: Left;  Left Carotid endarterectomy with dacron patch angioplasty  . Carotid endarterectomy  12/09/11    Left  . Hernia repair      x2  . Ankle surgery    . Inguinal hernia repair Left 01/22/2014    Procedure: LEFT INGUINAL HERNIA REPAIR WITH MESH;  Surgeon: [name], MD;  Location: [place];  Service: General;  Laterality: Left;  . Insertion of mesh Left 01/22/2014    Procedure: INSERTION OF MESH;  Surgeon: [name], MD;  Location: [place];  Service: General;  Laterality: Left;    Allergies  Allergen Reactions  . Oxycodone-Acetaminophen     REACTION: feels sick (patient reports taking acetaminophen without issue)     Current Outpatient Prescriptions  Medication Sig Dispense Refill  . citalopram (CELEXA) 10 MG tablet       . HYDROcodone-acetaminophen (NORCO/VICODIN) 5-325 MG per tablet Take 1-2 tablets by mouth every 4 (four) hours as needed for moderate pain.  30 tablet  0  . lisinopril (PRINIVIL,ZESTRIL) 10 MG tablet take 1 tablet by mouth every morning  100 tablet  3  . metFORMIN (GLUCOPHAGE) 1000 MG tablet take 1 tablet by mouth twice a day with meals  200 tablet  3   No current facility-administered medications for this visit.  Review of Systems : See HPI for pertinent positives and negatives.  Physical Examination  Filed Vitals:   02/13/14 1311  BP: 176/94  Pulse: 68  Resp: 18   Filed Weights   02/13/14 1311  Weight: 209 lb (94.802 kg)   Body mass index is 29.16 kg/(m^2).   General: WDWN male in NAD GAIT: using crutch Eyes: PERRLA Pulmonary:  Non-labored, CTAB, Negative  Rales, Negative rhonchi, & Negative wheezing.  Cardiac: regular Rhythm, no    detected murmur.  VASCULAR EXAM Carotid Bruits Left Right   Negative Negative     Radial pulses are 2+ palpable and equal.                                                                                                                             LE Pulses LEFT RIGHT       POPLITEAL   palpable    palpable    Gastrointestinal: soft, nontender, BS WNL, no r/g,  negative masses.  Musculoskeletal: Positive muscle atrophy/wasting in right upper extremity. M/S 5/5 tin left upper and lower extremies, 4/5 in right lower extremity, 2/5 in right UE, Extremities without ischemic changes.  Neurologic: A&O X 3; Appropriate Affect, Speech is normal CN 2-12 intact except slight tongue deviation to the right, weak right shoulder shrug, Pain and light touch intact in extremities, Motor exam as listed above.   Non-Invasive Vascular Imaging CAROTID DUPLEX 02/13/2014   Right ICA: <40% stenosis. Left ICA: patent graft with no evidence of restenosis.  Assessment: VINT POLA is a 68 y.o. male who presents with asymptomatic minimal right ICA stenosis and patent Left ICA graft. The  ICA stenosis is  Unchanged from previous exam.  Plan: Follow-up in 1 year with Carotid Duplex scan.   I discussed in depth with the patient the nature of atherosclerosis, and emphasized the importance of maximal medical management including strict control of blood pressure, blood glucose, and lipid levels, obtaining regular exercise, and continued cessation of smoking.  The patient is aware that without maximal medical management the underlying atherosclerotic disease process will progress, limiting the benefit of any interventions. The patient was given information about stroke prevention and what symptoms should prompt the patient to seek immediate medical care. Thank you for allowing [place] to participate in this patient's care.  Clemon Chambers, RN, MSN, FNP-C [facility] Office: [phone]  Clinic Physician: Early  02/13/2014 1:20 PM

## 2014-02-13 NOTE — Patient Instructions (Signed)

## 2014-02-13 NOTE — Addendum Note (Signed)
Addended by: Dorthula Rue L on: 02/13/2014 03:43 PM   Modules accepted: Orders

## 2014-02-16 ENCOUNTER — Other Ambulatory Visit: Payer: Self-pay | Admitting: Dermatology

## 2014-02-16 DIAGNOSIS — Z85828 Personal history of other malignant neoplasm of skin: Secondary | ICD-10-CM | POA: Diagnosis not present

## 2014-02-16 DIAGNOSIS — D239 Other benign neoplasm of skin, unspecified: Secondary | ICD-10-CM | POA: Diagnosis not present

## 2014-02-16 DIAGNOSIS — L570 Actinic keratosis: Secondary | ICD-10-CM | POA: Diagnosis not present

## 2014-02-16 DIAGNOSIS — L821 Other seborrheic keratosis: Secondary | ICD-10-CM | POA: Diagnosis not present

## 2014-02-16 DIAGNOSIS — D485 Neoplasm of uncertain behavior of skin: Secondary | ICD-10-CM | POA: Diagnosis not present

## 2014-02-20 ENCOUNTER — Encounter: Payer: Self-pay | Admitting: Family Medicine

## 2014-02-20 ENCOUNTER — Other Ambulatory Visit (HOSPITAL_COMMUNITY): Payer: Medicare Other

## 2014-02-20 ENCOUNTER — Ambulatory Visit (INDEPENDENT_AMBULATORY_CARE_PROVIDER_SITE_OTHER): Payer: Medicare Other | Admitting: Family Medicine

## 2014-02-20 ENCOUNTER — Ambulatory Visit: Payer: Medicare Other | Admitting: Vascular Surgery

## 2014-02-20 VITALS — BP 140/80 | Temp 98.9°F | Ht 71.0 in | Wt 211.0 lb

## 2014-02-20 DIAGNOSIS — E1149 Type 2 diabetes mellitus with other diabetic neurological complication: Secondary | ICD-10-CM

## 2014-02-20 DIAGNOSIS — N138 Other obstructive and reflux uropathy: Secondary | ICD-10-CM

## 2014-02-20 DIAGNOSIS — I6350 Cerebral infarction due to unspecified occlusion or stenosis of unspecified cerebral artery: Secondary | ICD-10-CM

## 2014-02-20 DIAGNOSIS — E785 Hyperlipidemia, unspecified: Secondary | ICD-10-CM

## 2014-02-20 DIAGNOSIS — Z23 Encounter for immunization: Secondary | ICD-10-CM | POA: Diagnosis not present

## 2014-02-20 DIAGNOSIS — I639 Cerebral infarction, unspecified: Secondary | ICD-10-CM

## 2014-02-20 DIAGNOSIS — R351 Nocturia: Secondary | ICD-10-CM | POA: Diagnosis not present

## 2014-02-20 DIAGNOSIS — N401 Enlarged prostate with lower urinary tract symptoms: Secondary | ICD-10-CM | POA: Diagnosis not present

## 2014-02-20 DIAGNOSIS — E119 Type 2 diabetes mellitus without complications: Secondary | ICD-10-CM

## 2014-02-20 DIAGNOSIS — I10 Essential (primary) hypertension: Secondary | ICD-10-CM

## 2014-02-20 LAB — MICROALBUMIN / CREATININE URINE RATIO
CREATININE, U: 164.2 mg/dL
MICROALB/CREAT RATIO: 0.3 mg/g (ref 0.0–30.0)
Microalb, Ur: 0.5 mg/dL (ref 0.0–1.9)

## 2014-02-20 LAB — HEPATIC FUNCTION PANEL
ALK PHOS: 44 U/L (ref 39–117)
ALT: 22 U/L (ref 0–53)
AST: 16 U/L (ref 0–37)
Albumin: 3.8 g/dL (ref 3.5–5.2)
BILIRUBIN DIRECT: 0.2 mg/dL (ref 0.0–0.3)
TOTAL PROTEIN: 6.5 g/dL (ref 6.0–8.3)
Total Bilirubin: 1.3 mg/dL — ABNORMAL HIGH (ref 0.3–1.2)

## 2014-02-20 LAB — LIPID PANEL
CHOLESTEROL: 129 mg/dL (ref 0–200)
HDL: 27.4 mg/dL — ABNORMAL LOW (ref >39.00)
LDL Cholesterol: 72 mg/dL (ref 0–99)
Total CHOL/HDL Ratio: 5
Triglycerides: 146 mg/dL (ref 0.0–149.0)
VLDL: 29.2 mg/dL (ref 0.0–40.0)

## 2014-02-20 LAB — POCT URINALYSIS DIPSTICK
BILIRUBIN UA: NEGATIVE
Blood, UA: NEGATIVE
Glucose, UA: NEGATIVE
Ketones, UA: NEGATIVE
Nitrite, UA: NEGATIVE
PH UA: 5
PROTEIN UA: NEGATIVE
SPEC GRAV UA: 1.025
Urobilinogen, UA: 0.2

## 2014-02-20 LAB — TSH: TSH: 1.55 u[IU]/mL (ref 0.35–5.50)

## 2014-02-20 LAB — HEMOGLOBIN A1C: HEMOGLOBIN A1C: 6.1 % (ref 4.6–6.5)

## 2014-02-20 LAB — PSA: PSA: 0.66 ng/mL (ref 0.10–4.00)

## 2014-02-20 MED ORDER — LISINOPRIL 20 MG PO TABS: 20.0000 mg | ORAL_TABLET | Freq: Every day | ORAL | Status: DC

## 2014-02-20 MED ORDER — METFORMIN HCL 1000 MG PO TABS: ORAL_TABLET | ORAL | Status: DC

## 2014-02-20 MED ORDER — CITALOPRAM HYDROBROMIDE 10 MG PO TABS: 10.0000 mg | ORAL_TABLET | Freq: Every day | ORAL | Status: DC

## 2014-02-20 NOTE — Progress Notes (Signed)
Pre visit review using our clinic review tool, if applicable. No additional management support is needed unless otherwise documented below in the visit note. 

## 2014-02-20 NOTE — Progress Notes (Signed)
   Subjective:    Patient ID: Darrell [name], male    DOB: February 04, 1946, 68 y.o.   MRN: [id number]  HPI Darrell [name] is a 68 year old married male nonsmoker who comes in today for a Medicare wellness examination because of a history of mild depression, hypertension, diabetes type 2, status post left inguinal hernia repair, status post left carotid endarterectomy complications with some residual right hemiparesis  Medications reviewed there've been no changes  He gets routine eye care, dental care, colonoscopy and GI, vaccinations updated but Darrell [name]  Cognitive function normal he walks and exercises on a daily basis. Home health safety reviewed no issues identified, no guns in the house, he does have a health care power of attorney and living well  Blood pressure at home is averaging 130 the 865 systolic diastolic mid-eighties. Blood sugar normal   Review of Systems  Constitutional: Negative.   HENT: Negative.   Eyes: Negative.   Respiratory: Negative.   Cardiovascular: Negative.   Gastrointestinal: Negative.   Genitourinary: Negative.   Musculoskeletal: Negative.   Skin: Negative.   Neurological: Negative.   Psychiatric/Behavioral: Negative.        Objective:   Physical Exam  Nursing note and vitals reviewed. Constitutional: He is oriented to person, place, and time. He appears well-developed and well-nourished.  HENT:  Head: Normocephalic and atraumatic.  Right Ear: External ear normal.  Left Ear: External ear normal.  Nose: Nose normal.  Mouth/Throat: Oropharynx is clear and moist.  Eyes: Conjunctivae and EOM are normal. Pupils are equal, round, and reactive to light.  Neck: Normal range of motion. Neck supple. No JVD present. No tracheal deviation present. No thyromegaly present.  Cardiovascular: Normal rate, regular rhythm, normal heart sounds and intact distal pulses.  Exam reveals no gallop and no friction rub.   No murmur heard. No carotid nor aortic burst peripheral  pulses 1+ and symmetrical  Pulmonary/Chest: Effort normal and breath sounds normal. No stridor. No respiratory distress. He has no wheezes. He has no rales. He exhibits no tenderness.  Abdominal: Soft. Bowel sounds are normal. He exhibits no distension and no mass. There is no tenderness. There is no rebound and no guarding.  Genitourinary: Rectum normal, prostate normal and penis normal. Guaiac negative stool. No penile tenderness.  Musculoskeletal: Normal range of motion. He exhibits no edema and no tenderness.  Lymphadenopathy:    He has no cervical adenopathy.  Neurological: He is alert and oriented to person, place, and time. He has normal reflexes. No cranial nerve deficit. He exhibits normal muscle tone.  Skin: Skin is warm and dry. No rash noted. No erythema. No pallor.  Psychiatric: He has a normal mood and affect. His behavior is normal. Judgment and thought content normal.          Assessment & Plan:  Hypertension not at goal increase lisinopril to 20 mg daily BP check followup in 4 weeks if not normal  History of mild depression continue Celexa  Diabetes type 2 continue metformin 1000 mg twice a day check labs  Status post left inguinal hernia repair  Status post left carotid artery repair with subsequent complications residual right hemiparesis

## 2014-02-20 NOTE — Patient Instructions (Signed)
Increase the lisinopril to 20 mg daily  Check your blood pressure daily in the morning for 4 weeks........[name] blood pressure goal 135/85 or less......... if your blood pressures at goal after a month and check it weekly......... if not at goal call and leave a voicemail for Darrell [name] and we will adjust her medication  Since your blood sugar is very very good I would just check once weekly  Return in 6 months for followup sooner if any problem

## 2014-02-21 ENCOUNTER — Telehealth: Payer: Self-pay | Admitting: Family Medicine

## 2014-02-21 NOTE — Telephone Encounter (Signed)
Relevant patient education assigned to patient using Emmi. ° °

## 2014-02-23 ENCOUNTER — Other Ambulatory Visit: Payer: Self-pay | Admitting: Family Medicine

## 2014-02-23 DIAGNOSIS — E119 Type 2 diabetes mellitus without complications: Secondary | ICD-10-CM

## 2014-03-12 ENCOUNTER — Encounter: Payer: Self-pay | Admitting: Internal Medicine

## 2014-03-27 ENCOUNTER — Other Ambulatory Visit: Payer: Self-pay | Admitting: Dermatology

## 2014-03-27 DIAGNOSIS — Z85828 Personal history of other malignant neoplasm of skin: Secondary | ICD-10-CM | POA: Diagnosis not present

## 2014-03-27 DIAGNOSIS — D485 Neoplasm of uncertain behavior of skin: Secondary | ICD-10-CM | POA: Diagnosis not present

## 2014-03-27 DIAGNOSIS — L089 Local infection of the skin and subcutaneous tissue, unspecified: Secondary | ICD-10-CM | POA: Diagnosis not present

## 2014-04-06 ENCOUNTER — Other Ambulatory Visit: Payer: Self-pay | Admitting: Family Medicine

## 2014-04-20 DIAGNOSIS — L570 Actinic keratosis: Secondary | ICD-10-CM | POA: Diagnosis not present

## 2014-04-25 ENCOUNTER — Telehealth: Payer: Self-pay | Admitting: Family Medicine

## 2014-04-25 NOTE — Telephone Encounter (Signed)
Pt had labs drawn 02/20/14 and was instructed to repeat labs in 3 mnths.  Wife has called to r/s lab appt due to them living out of town for the entire summer until their new house is ready to be moved in.  Wife wants to know if Dr. Sherren Mocha is ok with pt waiting until October to have A1C completed, as pt is due to have it repeated in July.

## 2014-04-26 NOTE — Telephone Encounter (Signed)
LMOM

## 2014-05-04 ENCOUNTER — Encounter: Payer: Medicare Other | Admitting: Internal Medicine

## 2014-05-10 ENCOUNTER — Encounter: Payer: Self-pay | Admitting: Internal Medicine

## 2014-06-06 ENCOUNTER — Other Ambulatory Visit: Payer: Medicare Other

## 2014-06-12 ENCOUNTER — Encounter: Payer: Self-pay | Admitting: Internal Medicine

## 2014-07-20 DIAGNOSIS — L570 Actinic keratosis: Secondary | ICD-10-CM | POA: Diagnosis not present

## 2014-07-25 DIAGNOSIS — Z23 Encounter for immunization: Secondary | ICD-10-CM | POA: Diagnosis not present

## 2014-07-31 ENCOUNTER — Other Ambulatory Visit: Payer: Medicare Other

## 2014-07-31 DIAGNOSIS — H2513 Age-related nuclear cataract, bilateral: Secondary | ICD-10-CM | POA: Diagnosis not present

## 2014-07-31 DIAGNOSIS — H40053 Ocular hypertension, bilateral: Secondary | ICD-10-CM | POA: Diagnosis not present

## 2014-07-31 DIAGNOSIS — H43813 Vitreous degeneration, bilateral: Secondary | ICD-10-CM | POA: Diagnosis not present

## 2014-07-31 DIAGNOSIS — E119 Type 2 diabetes mellitus without complications: Secondary | ICD-10-CM | POA: Diagnosis not present

## 2014-07-31 LAB — HM DIABETES EYE EXAM

## 2014-08-07 ENCOUNTER — Ambulatory Visit: Payer: Medicare Other | Admitting: Family Medicine

## 2014-08-08 ENCOUNTER — Encounter: Payer: Self-pay | Admitting: Family Medicine

## 2014-09-04 ENCOUNTER — Other Ambulatory Visit: Payer: Medicare Other

## 2014-09-11 ENCOUNTER — Ambulatory Visit: Payer: Medicare Other | Admitting: Family Medicine

## 2014-09-18 ENCOUNTER — Other Ambulatory Visit (INDEPENDENT_AMBULATORY_CARE_PROVIDER_SITE_OTHER): Payer: Medicare Other

## 2014-09-18 DIAGNOSIS — E119 Type 2 diabetes mellitus without complications: Secondary | ICD-10-CM | POA: Diagnosis not present

## 2014-09-18 LAB — BASIC METABOLIC PANEL
BUN: 15 mg/dL (ref 6–23)
CO2: 29 mEq/L (ref 19–32)
Calcium: 9 mg/dL (ref 8.4–10.5)
Chloride: 101 mEq/L (ref 96–112)
Creatinine, Ser: 0.8 mg/dL (ref 0.4–1.5)
GFR: 101.7 mL/min (ref >60.00)
GLUCOSE: 204 mg/dL — AB (ref 70–99)
POTASSIUM: 4.8 meq/L (ref 3.5–5.1)
Sodium: 138 mEq/L (ref 135–145)

## 2014-09-18 LAB — MICROALBUMIN / CREATININE URINE RATIO
Creatinine,U: 148.9 mg/dL
MICROALB/CREAT RATIO: 0.7 mg/g (ref 0.0–30.0)
Microalb, Ur: 1 mg/dL (ref 0.0–1.9)

## 2014-09-18 LAB — HEMOGLOBIN A1C: Hgb A1c MFr Bld: 6.7 % — ABNORMAL HIGH (ref 4.6–6.5)

## 2014-09-19 ENCOUNTER — Other Ambulatory Visit: Payer: Self-pay | Admitting: Dermatology

## 2014-09-19 DIAGNOSIS — C44229 Squamous cell carcinoma of skin of left ear and external auricular canal: Secondary | ICD-10-CM | POA: Diagnosis not present

## 2014-09-19 DIAGNOSIS — L820 Inflamed seborrheic keratosis: Secondary | ICD-10-CM | POA: Diagnosis not present

## 2014-09-19 DIAGNOSIS — D485 Neoplasm of uncertain behavior of skin: Secondary | ICD-10-CM | POA: Diagnosis not present

## 2014-09-19 DIAGNOSIS — D0422 Carcinoma in situ of skin of left ear and external auricular canal: Secondary | ICD-10-CM | POA: Diagnosis not present

## 2014-09-19 DIAGNOSIS — Z85828 Personal history of other malignant neoplasm of skin: Secondary | ICD-10-CM | POA: Diagnosis not present

## 2014-09-19 DIAGNOSIS — L570 Actinic keratosis: Secondary | ICD-10-CM | POA: Diagnosis not present

## 2014-09-25 ENCOUNTER — Encounter: Payer: Self-pay | Admitting: Family Medicine

## 2014-09-25 ENCOUNTER — Ambulatory Visit (INDEPENDENT_AMBULATORY_CARE_PROVIDER_SITE_OTHER): Payer: Medicare Other | Admitting: Family Medicine

## 2014-09-25 VITALS — BP 148/84 | Temp 98.3°F | Wt 210.0 lb

## 2014-09-25 DIAGNOSIS — I6529 Occlusion and stenosis of unspecified carotid artery: Secondary | ICD-10-CM

## 2014-09-25 DIAGNOSIS — E139 Other specified diabetes mellitus without complications: Secondary | ICD-10-CM

## 2014-09-25 NOTE — Progress Notes (Signed)
Pre visit review using our clinic review tool, if applicable. No additional management support is needed unless otherwise documented below in the visit note. Lab Results  Component Value Date   HGBA1C 6.7* 09/18/2014   HGBA1C 6.1 02/20/2014   HGBA1C 6.2 07/04/2013   Lab Results  Component Value Date   MICROALBUR 1.0 09/18/2014   LDLCALC 72 02/20/2014   CREATININE 0.8 09/18/2014

## 2014-09-25 NOTE — Progress Notes (Signed)
   Subjective:    Patient ID: Darrell [name], male    DOB: 1946/08/07, 68 y.o.   MRN: [id number]  HPI Darrell [name]  is a 68 year old married male nonsmoker who comes in today for follow-up of diabetes  His A1c was 6.1 last spring now at 6.7. Blood sugar was in the 204 range. He may of had some dietary indiscretion over Thanksgiving.  Blood pressure under good control   Review of Systems Review of systems negative    Objective:   Physical Exam Well-developed well-nourished male no acute distress vital signs stable he is afebrile       Assessment & Plan:  Diabetes type 2 at goal....... continue current therapy...Marland KitchenMarland KitchenMarland Kitchen window 6 months

## 2014-09-25 NOTE — Patient Instructions (Signed)
Continue current medications  Follow-up the fourth week in April for your physical exam  Labs one week prior

## 2014-10-16 DIAGNOSIS — Z85828 Personal history of other malignant neoplasm of skin: Secondary | ICD-10-CM | POA: Diagnosis not present

## 2014-10-16 DIAGNOSIS — C44229 Squamous cell carcinoma of skin of left ear and external auricular canal: Secondary | ICD-10-CM | POA: Diagnosis not present

## 2014-12-04 ENCOUNTER — Ambulatory Visit (AMBULATORY_SURGERY_CENTER): Payer: Self-pay | Admitting: *Deleted

## 2014-12-04 VITALS — Ht 71.0 in | Wt 212.4 lb

## 2014-12-04 DIAGNOSIS — Z86010 Personal history of colonic polyps: Secondary | ICD-10-CM

## 2014-12-04 MED ORDER — MOVIPREP 100 G PO SOLR: ORAL | Status: DC

## 2014-12-04 NOTE — Progress Notes (Signed)
No egg or soy allergy  No anesthesia or intubation problems per pt  No diet medications taken   

## 2014-12-18 ENCOUNTER — Ambulatory Visit (AMBULATORY_SURGERY_CENTER): Payer: Medicare Other | Admitting: Internal Medicine

## 2014-12-18 ENCOUNTER — Encounter: Payer: Self-pay | Admitting: Internal Medicine

## 2014-12-18 VITALS — BP 139/75 | HR 66 | Temp 97.6°F | Resp 23 | Ht 71.0 in | Wt 212.0 lb

## 2014-12-18 DIAGNOSIS — Z86010 Personal history of colonic polyps: Secondary | ICD-10-CM

## 2014-12-18 DIAGNOSIS — K635 Polyp of colon: Secondary | ICD-10-CM | POA: Diagnosis not present

## 2014-12-18 DIAGNOSIS — Z800 Family history of malignant neoplasm of digestive organs: Secondary | ICD-10-CM | POA: Diagnosis not present

## 2014-12-18 DIAGNOSIS — D125 Benign neoplasm of sigmoid colon: Secondary | ICD-10-CM

## 2014-12-18 DIAGNOSIS — E119 Type 2 diabetes mellitus without complications: Secondary | ICD-10-CM | POA: Diagnosis not present

## 2014-12-18 DIAGNOSIS — Z8673 Personal history of transient ischemic attack (TIA), and cerebral infarction without residual deficits: Secondary | ICD-10-CM | POA: Diagnosis not present

## 2014-12-18 DIAGNOSIS — D122 Benign neoplasm of ascending colon: Secondary | ICD-10-CM | POA: Diagnosis not present

## 2014-12-18 DIAGNOSIS — G4733 Obstructive sleep apnea (adult) (pediatric): Secondary | ICD-10-CM | POA: Diagnosis not present

## 2014-12-18 LAB — GLUCOSE, CAPILLARY
GLUCOSE-CAPILLARY: 90 mg/dL (ref 70–99)
Glucose-Capillary: 102 mg/dL — ABNORMAL HIGH (ref 70–99)

## 2014-12-18 MED ORDER — SODIUM CHLORIDE 0.9 % IV SOLN: 500.0000 mL | INTRAVENOUS | Status: DC

## 2014-12-18 NOTE — Patient Instructions (Signed)
YOU HAD AN ENDOSCOPIC PROCEDURE TODAY AT THE [facility]: Refer to the procedure report that was given to you for any specific questions about what was found during the examination.  If the procedure report does not answer your questions, please call your gastroenterologist to clarify.  If you requested that your care partner not be given the details of your procedure findings, then the procedure report has been included in a sealed envelope for you to review at your convenience later.  YOU SHOULD EXPECT: Some feelings of bloating in the abdomen. Passage of more gas than usual.  Walking can help get rid of the air that was put into your GI tract during the procedure and reduce the bloating. If you had a lower endoscopy (such as a colonoscopy or flexible sigmoidoscopy) you may notice spotting of blood in your stool or on the toilet paper. If you underwent a bowel prep for your procedure, then you may not have a normal bowel movement for a few days.  DIET: Your first meal following the procedure should be a light meal and then it is ok to progress to your normal diet.  A half-sandwich or bowl of soup is an example of a good first meal.  Heavy or fried foods are harder to digest and may make you feel nauseous or bloated.  Likewise meals heavy in dairy and vegetables can cause extra gas to form and this can also increase the bloating.  Drink plenty of fluids but you should avoid alcoholic beverages for 24 hours.  ACTIVITY: Your care partner should take you home directly after the procedure.  You should plan to take it easy, moving slowly for the rest of the day.  You can resume normal activity the day after the procedure however you should NOT DRIVE or use heavy machinery for 24 hours (because of the sedation medicines used during the test).    SYMPTOMS TO REPORT IMMEDIATELY: A gastroenterologist can be reached at any hour.  During normal business hours, 8:30 AM to 5:00 PM Monday through Friday,  call [phone].  After hours and on weekends, please call the GI answering service at [phone] who will take a message and have the physician on call contact you.   Following lower endoscopy (colonoscopy or flexible sigmoidoscopy):  Excessive amounts of blood in the stool  Significant tenderness or worsening of abdominal pains  Swelling of the abdomen that is new, acute  Fever of 100F or higher  FOLLOW UP: If any biopsies were taken you will be contacted by phone or by letter within the next 1-3 weeks.  Call your gastroenterologist if you have not heard about the biopsies in 3 weeks.  Our staff will call the home number listed on your records the next business day following your procedure to check on you and address any questions or concerns that you may have at that time regarding the information given to you following your procedure. This is a courtesy call and so if there is no answer at the home number and we have not heard from you through the emergency physician on call, we will assume that you have returned to your regular daily activities without incident.  SIGNATURES/CONFIDENTIALITY: You and/or your care partner have signed paperwork which will be entered into your electronic medical record.  These signatures attest to the fact that that the information above on your After Visit Summary has been reviewed and is understood.  Full responsibility of the confidentiality of this   discharge information lies with you and/or your care-partner.  Continue your normal medications  Please read over handout about polyps  No Aspirin, Aspirin containing products (BC or Goody powders), NSAIDS (Ibuprofen, Advil, Aleve and Motrin) for 2 weeks; Tylenol is ok

## 2014-12-18 NOTE — Op Note (Signed)
[place]  [address] [place], [address]   COLONOSCOPY PROCEDURE REPORT  PATIENT: Darrell, [name]  MR#: #[id number] BIRTHDATE: [birth year] , 35  yrs. old GENDER: male ENDOSCOPIST: Lafayette Dragon, MD REFERRED [id number] Delora Fuel, M.D. PROCEDURE DATE:  12/18/2014 PROCEDURE:   Colonoscopy with snare polypectomy First Screening Colonoscopy - Avg.  risk and is 50 yrs.  old or older - No.  Prior Negative Screening - Now for repeat screening. N/A  History of Adenoma - Now for follow-up colonoscopy & has been > or = to 3 yrs.  N/A  Polyps Removed Today? Yes. ASA CLASS:   Class II INDICATIONS:ppositive family history of colon cancer in patient's parent.  Prior colonoscopy in 2000, 2005, hyperplastic polyp, February 2010 ,2 polyps removed. MEDICATIONS: Propofol 200 mg IV  DESCRIPTION OF PROCEDURE:   After the risks benefits and alternatives of the procedure were thoroughly explained, informed consent was obtained.  The digital rectal exam revealed no abnormalities of the rectum.   The LB [id number]  endoscope was introduced through the anus and advanced to the cecum, which was identified by both the appendix and ileocecal valve. No adverse events experienced.   The quality of the prep was good, using MoviPrep  The instrument was then slowly withdrawn as the colon was fully examined.      COLON FINDINGS: Two polypoid shaped semi-pedunculated polyps were found in the sigmoid colon and ascending colon. .polyps were removed with cold snare and recovered. Size of each polyp was about 7 mm Retroflexed views revealed no abnormalities. The time to cecum=8 minutes 400 seconds.  Withdrawal time=6 minutes 36 seconds. The scope was withdrawn and the procedure completed. COMPLICATIONS: There were no immediate complications.  ENDOSCOPIC IMPRESSION: Two semi-pedunculated polyps were found in the sigmoid colon and ascending colon  RECOMMENDATIONS: 1.  Await  pathology results 2.  High-fiber diet Recall colonoscopy in 5 years  eSigned:  Lafayette Dragon, MD 12/18/2014 3:47 PM   cc:   PATIENT NAME:  Darrell [name], Darrell [name] MR#: #[id number]

## 2014-12-18 NOTE — Progress Notes (Signed)
Called to room to assist during endoscopic procedure.  Patient ID and intended procedure confirmed with present staff. Received instructions for my participation in the procedure from the performing physician.  

## 2014-12-18 NOTE — Progress Notes (Signed)
Stable to RR 

## 2014-12-19 ENCOUNTER — Telehealth: Payer: Self-pay

## 2014-12-19 NOTE — Telephone Encounter (Signed)
No answer, left message

## 2014-12-25 ENCOUNTER — Encounter: Payer: Self-pay | Admitting: Internal Medicine

## 2015-02-11 ENCOUNTER — Other Ambulatory Visit (INDEPENDENT_AMBULATORY_CARE_PROVIDER_SITE_OTHER): Payer: Medicare Other

## 2015-02-11 DIAGNOSIS — E139 Other specified diabetes mellitus without complications: Secondary | ICD-10-CM

## 2015-02-11 LAB — PSA: PSA: 0.46 ng/mL (ref 0.10–4.00)

## 2015-02-11 LAB — CBC WITH DIFFERENTIAL/PLATELET
BASOS ABS: 0 10*3/uL (ref 0.0–0.1)
BASOS PCT: 0.2 % (ref 0.0–3.0)
EOS ABS: 0.2 10*3/uL (ref 0.0–0.7)
Eosinophils Relative: 2.2 % (ref 0.0–5.0)
HCT: 46.4 % (ref 39.0–52.0)
HEMOGLOBIN: 15.6 g/dL (ref 13.0–17.0)
Lymphocytes Relative: 18.7 % (ref 12.0–46.0)
Lymphs Abs: 1.4 10*3/uL (ref 0.7–4.0)
MCHC: 33.5 g/dL (ref 30.0–36.0)
MCV: 88.3 fl (ref 78.0–100.0)
MONO ABS: 0.6 10*3/uL (ref 0.1–1.0)
Monocytes Relative: 8.2 % (ref 3.0–12.0)
NEUTROS PCT: 70.7 % (ref 43.0–77.0)
Neutro Abs: 5.1 10*3/uL (ref 1.4–7.7)
Platelets: 175 10*3/uL (ref 150.0–400.0)
RBC: 5.26 Mil/uL (ref 4.22–5.81)
RDW: 14.1 % (ref 11.5–15.5)
WBC: 7.2 10*3/uL (ref 4.0–10.5)

## 2015-02-11 LAB — HEMOGLOBIN A1C: Hgb A1c MFr Bld: 6.5 % (ref 4.6–6.5)

## 2015-02-11 LAB — BASIC METABOLIC PANEL
BUN: 13 mg/dL (ref 6–23)
CALCIUM: 9.4 mg/dL (ref 8.4–10.5)
CO2: 29 meq/L (ref 19–32)
Chloride: 104 mEq/L (ref 96–112)
Creatinine, Ser: 0.73 mg/dL (ref 0.40–1.50)
GFR: 112.9 mL/min (ref >60.00)
Glucose, Bld: 137 mg/dL — ABNORMAL HIGH (ref 70–99)
Potassium: 4.6 mEq/L (ref 3.5–5.1)
SODIUM: 138 meq/L (ref 135–145)

## 2015-02-11 LAB — LIPID PANEL
CHOL/HDL RATIO: 5
Cholesterol: 124 mg/dL (ref 0–200)
HDL: 26.5 mg/dL — ABNORMAL LOW (ref >39.00)
LDL CALC: 74 mg/dL (ref 0–99)
NonHDL: 97.5
TRIGLYCERIDES: 120 mg/dL (ref 0.0–149.0)
VLDL: 24 mg/dL (ref 0.0–40.0)

## 2015-02-11 LAB — HEPATIC FUNCTION PANEL
ALK PHOS: 44 U/L (ref 39–117)
ALT: 20 U/L (ref 0–53)
AST: 16 U/L (ref 0–37)
Albumin: 4 g/dL (ref 3.5–5.2)
Bilirubin, Direct: 0.2 mg/dL (ref 0.0–0.3)
Total Bilirubin: 1 mg/dL (ref 0.2–1.2)
Total Protein: 6.6 g/dL (ref 6.0–8.3)

## 2015-02-11 LAB — POCT URINALYSIS DIPSTICK
BILIRUBIN UA: NEGATIVE
Blood, UA: NEGATIVE
GLUCOSE UA: NEGATIVE
KETONES UA: NEGATIVE
Nitrite, UA: NEGATIVE
PROTEIN UA: NEGATIVE
SPEC GRAV UA: 1.02
Urobilinogen, UA: 1
pH, UA: 5.5

## 2015-02-11 LAB — MICROALBUMIN / CREATININE URINE RATIO
Creatinine,U: 134.2 mg/dL
Microalb Creat Ratio: 0.5 mg/g (ref 0.0–30.0)
Microalb, Ur: 0.7 mg/dL (ref 0.0–1.9)

## 2015-02-11 LAB — TSH: TSH: 2.36 u[IU]/mL (ref 0.35–4.50)

## 2015-02-18 ENCOUNTER — Encounter: Payer: Self-pay | Admitting: Family Medicine

## 2015-02-18 ENCOUNTER — Ambulatory Visit (INDEPENDENT_AMBULATORY_CARE_PROVIDER_SITE_OTHER): Payer: Medicare Other | Admitting: Family Medicine

## 2015-02-18 VITALS — BP 140/80 | HR 59 | Temp 98.6°F | Ht 70.75 in | Wt 209.3 lb

## 2015-02-18 DIAGNOSIS — F329 Major depressive disorder, single episode, unspecified: Secondary | ICD-10-CM

## 2015-02-18 DIAGNOSIS — E139 Other specified diabetes mellitus without complications: Secondary | ICD-10-CM

## 2015-02-18 DIAGNOSIS — I10 Essential (primary) hypertension: Secondary | ICD-10-CM

## 2015-02-18 DIAGNOSIS — E785 Hyperlipidemia, unspecified: Secondary | ICD-10-CM | POA: Diagnosis not present

## 2015-02-18 DIAGNOSIS — F32A Depression, unspecified: Secondary | ICD-10-CM

## 2015-02-18 DIAGNOSIS — Z23 Encounter for immunization: Secondary | ICD-10-CM | POA: Diagnosis not present

## 2015-02-18 MED ORDER — LISINOPRIL 20 MG PO TABS: 20.0000 mg | ORAL_TABLET | Freq: Every day | ORAL | Status: DC

## 2015-02-18 MED ORDER — CITALOPRAM HYDROBROMIDE 10 MG PO TABS: 10.0000 mg | ORAL_TABLET | Freq: Every day | ORAL | Status: DC

## 2015-02-18 MED ORDER — METFORMIN HCL 1000 MG PO TABS: ORAL_TABLET | ORAL | Status: DC

## 2015-02-18 NOTE — Progress Notes (Signed)
Pre visit review using our clinic review tool, if applicable. No additional management support is needed unless otherwise documented below in the visit note. 

## 2015-02-18 NOTE — Patient Instructions (Signed)
Continue current medications  Since your blood sugar is normal,,,,,,,,,,, follow-up office visit in 6 months,,,,,,,, nonfasting labs one week prior

## 2015-02-18 NOTE — Progress Notes (Signed)
Subjective:    Patient ID: Darrell [name], male    DOB: [birth year], 69 y.o.   MRN: [id number]  HPI [name] is a 69 year old married male nonsmoker who comes in today for general physical examination because of a history of type 2 diabetes and hypertension and status post stroke following left carotid endarterectomy  He's on Celexa 10 mg nightly and sleeping well  He takes lisinopril 20 mg daily for hypertension BP at home 140/80. BP here 170/100. He always gets anxious when he comes in here blood pressure at home normal  He takes metformin 1000 mg twice a day blood sugar in the 120 to 1:30 range A1c 6.5%.  He gets routine eye care, dental care, recent colonoscopy normal except for 2 small polyps. Vaccinations up-to-date except he is due a tetanus booster today. Shingles vaccine 2009.  Cognitive function normal he walks on a daily basis despite his right hemiparesis, home health safety reviewed no issues identified, no guns in the house, he does have a healthcare power of attorney and living well  [name] relates that the children want to know about his sleep apnea. He did have surgery and is been on CPAP however when he lost weight his symptoms went away. She says he does not snore nor does he stopped breathing at night. Recommend no further evaluation at this time since he is asymptomatic   Review of Systems  Constitutional: Negative.   HENT: Negative.   Eyes: Negative.   Respiratory: Negative.   Cardiovascular: Negative.   Gastrointestinal: Negative.   Endocrine: Negative.   Genitourinary: Negative.   Musculoskeletal: Negative.   Skin: Negative.   Allergic/Immunologic: Negative.   Neurological: Negative.   Hematological: Negative.   Psychiatric/Behavioral: Negative.        Objective:   Physical Exam  Constitutional: He is oriented to person, place, and time. He appears well-developed and well-nourished.  HENT:  Head: Normocephalic and atraumatic.  Right Ear: External  ear normal.  Left Ear: External ear normal.  Nose: Nose normal.  Mouth/Throat: Oropharynx is clear and moist.  Eyes: Conjunctivae and EOM are normal. Pupils are equal, round, and reactive to light.  Neck: Normal range of motion. Neck supple. No JVD present. No tracheal deviation present. No thyromegaly present.  Cardiovascular: Normal rate, regular rhythm, normal heart sounds and intact distal pulses.  Exam reveals no gallop and no friction rub.   No murmur heard. Pulmonary/Chest: Effort normal and breath sounds normal. No stridor. No respiratory distress. He has no wheezes. He has no rales. He exhibits no tenderness.  Abdominal: Soft. Bowel sounds are normal. He exhibits no distension and no mass. There is no tenderness. There is no rebound and no guarding.  Genitourinary: Rectum normal, prostate normal and penis normal. Guaiac negative stool. No penile tenderness.  Musculoskeletal: Normal range of motion. He exhibits no edema or tenderness.  Lymphadenopathy:    He has no cervical adenopathy.  Neurological: He is alert and oriented to person, place, and time. He has normal reflexes. No cranial nerve deficit. He exhibits normal muscle tone.  Pressure right hemiparesis  Skin: Skin is warm and dry. No rash noted. No erythema. No pallor.  Total body skin exam normal except for scar left neck from previous carotid endarterectomy  Psychiatric: He has a normal mood and affect. His behavior is normal. Judgment and thought content normal.  Nursing note and vitals reviewed.         Assessment & Plan:  Diabetes type 2 at  goal........... continue current therapy  Hypertension at goal....... 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 Continue Celexa.

## 2015-02-19 ENCOUNTER — Ambulatory Visit: Payer: Medicare Other | Admitting: Family

## 2015-02-19 ENCOUNTER — Other Ambulatory Visit (HOSPITAL_COMMUNITY): Payer: Medicare Other

## 2015-03-15 ENCOUNTER — Encounter: Payer: Self-pay | Admitting: Family

## 2015-03-18 ENCOUNTER — Ambulatory Visit (INDEPENDENT_AMBULATORY_CARE_PROVIDER_SITE_OTHER): Payer: Medicare Other | Admitting: Family

## 2015-03-18 ENCOUNTER — Ambulatory Visit (HOSPITAL_COMMUNITY)
Admission: RE | Admit: 2015-03-18 | Discharge: 2015-03-18 | Disposition: A | Discharge status: Home / Self Care | Payer: Medicare Other | Source: Ambulatory Visit | Attending: Family | Admitting: Family

## 2015-03-18 ENCOUNTER — Encounter: Payer: Self-pay | Admitting: Family

## 2015-03-18 VITALS — BP 132/80 | HR 66 | Temp 96.9°F | Resp 16 | Ht 71.0 in | Wt 209.0 lb

## 2015-03-18 DIAGNOSIS — Z48812 Encounter for surgical aftercare following surgery on the circulatory system: Secondary | ICD-10-CM | POA: Diagnosis not present

## 2015-03-18 DIAGNOSIS — I6523 Occlusion and stenosis of bilateral carotid arteries: Secondary | ICD-10-CM

## 2015-03-18 DIAGNOSIS — Z87891 Personal history of nicotine dependence: Secondary | ICD-10-CM

## 2015-03-18 DIAGNOSIS — Z9889 Other specified postprocedural states: Secondary | ICD-10-CM

## 2015-03-18 NOTE — Progress Notes (Signed)
Established Carotid Patient   History of Present Illness  Darrell [name] is a 70 y.o. male patient of Dr. Kellie Simmering who returns for continued followup regarding his carotid occlusive disease. He underwent left carotid endarterectomy in February of 2013 which acutely thrombosed a few hours postoperatively and required return to the OR with insertion of a common to internal carotid saphenous vein graft. He suffered a left brain CVA. He has had significant improvement in his neurologic status. He returns for follow up. He had a uvulectomy for OSA.  The patient denies amaurosis fugax or monocular blindness. The patient denies facial drooping. Pt. Reports right hemiplegia. The patient denies receptive or expressive aphasia. Pt. Denies right lower extremity weakness.  [name] previous neurologic deficits are Unchanged.  Pt reports New Medical or Surgical History: none  Pt Diabetic: Yes, in control, able to get off insulin Pt smoker: former smoker, quit in 2005  Pt meds include: Statin : No: , wife states he has never taken a statin,  he was in a study at [facility] about 2005, had a very low cholesterol ASA: stopped by his PCP as he was bruising a great deal, per wife Other anticoagulants/antiplatelets: no  Past Medical History  Diagnosis Date  . Diabetes mellitus   . Hypertension     dr Sherren Mocha  . Sleep apnea     cpap      sleep study > 4 yrs; no longer wearing CPAP  . Stroke     rt side weakness, 2013    Social History History  Substance Use Topics  . Smoking status: Former Smoker -- 2.50 packs/day for 30 years    Types: Cigarettes    Quit date: 12/07/2001  . Smokeless tobacco: Never Used  . Alcohol Use: No    Family History Family History  Problem Relation Age of Onset  . Heart disease Father   . Colon cancer Father     at age 34  . Esophageal cancer Neg Hx   . Rectal cancer Neg Hx   . Stomach cancer Neg Hx     Surgical History Past Surgical History   Procedure Laterality Date  . Heel spur surgery    . [place] surgery    . Endarterectomy  12/09/2011    Procedure: ENDARTERECTOMY CAROTID;  Surgeon: Tinnie Gens, MD;  Location: [facility];  Service: Vascular;  Laterality: Left;  Left Carotid endarterectomy with dacron patch angioplasty  . Carotid endarterectomy  12/09/11    Left  . Hernia repair      x2  . Ankle surgery    . Inguinal hernia repair Left 01/22/2014    Procedure: LEFT INGUINAL HERNIA REPAIR WITH MESH;  Surgeon: [name], MD;  Location: [facility];  Service: General;  Laterality: Left;  . Insertion of mesh Left 01/22/2014    Procedure: INSERTION OF MESH;  Surgeon: [name], MD;  Location: [facility];  Service: General;  Laterality: Left;  . Colonoscopy      Allergies  Allergen Reactions  . Oxycodone-Acetaminophen     REACTION: feels sick (patient reports taking acetaminophen without issue)     Current Outpatient Prescriptions  Medication Sig Dispense Refill  . citalopram (CELEXA) 10 MG tablet Take 1 tablet (10 mg total) by mouth daily. 100 tablet 3  . lisinopril (PRINIVIL,ZESTRIL) 20 MG tablet Take 1 tablet (20 mg total) by mouth daily. 90 tablet 3  . metFORMIN (GLUCOPHAGE) 1000 MG tablet take 1 tablet by mouth twice a day with  meals 200 tablet 3  . ONE TOUCH ULTRA TEST test strip TEST once daily (Patient taking differently: TEST once Weekly per pateint) 100 each PRN   No current facility-administered medications for this visit.    Review of Systems : See HPI for pertinent positives and negatives.  Physical Examination  Filed Vitals:   03/18/15 1519 03/18/15 1524  BP: 142/88 132/80  Pulse: 64 66  Temp: 96.9 F (36.1 C)   TempSrc: Oral   Resp: 16   Height: 5\' 11"  (1.803 m)   Weight: 209 lb (94.802 kg)   SpO2: 99%    Body mass index is 29.16 kg/(m^2).   General: WDWN male in NAD GAIT: using a crutch, limp Eyes: PERRLA Pulmonary: Non-labored, CTAB, Negative Rales, Negative rhonchi, & Negative  wheezing.  Cardiac: regular Rhythm, no detected murmur.  VASCULAR EXAM Carotid Bruits Left Right   Negative Negative    Radial pulses are 2+ palpable and equal.      LE Pulses LEFT RIGHT   POPLITEAL  palpable   palpable    Gastrointestinal: soft, nontender, BS WNL, no r/g,no palpable masses.  Musculoskeletal: Positive muscle atrophy/wasting in right upper extremity, no contractures. M/S 5/5 in left upper and lower extremies, 4/5 in right lower extremity, 2/5 in right UE, Extremities without ischemic changes.  Neurologic: A&O X 3; Appropriate Affect, Speech is normal CN 2-12 intact except slight tongue deviation to the right, weak right shoulder shrug, Pain and light touch intact in extremities, Motor exam as listed above.         Non-Invasive Vascular Imaging CAROTID DUPLEX 03/18/2015   CEREBROVASCULAR DUPLEX EVALUATION    INDICATION: Carotid artery disease    PREVIOUS INTERVENTION(S): Left common carotid artery to internal carotid artery bypass graft with external carotid artery ligated following a thrombosed left endarterectomy site on 12/09/2011    DUPLEX EXAM: Carotid duplex    RIGHT  LEFT  Peak Systolic Velocities (cm/s) End Diastolic Velocities (cm/s) Plaque LOCATION Peak Systolic Velocities (cm/s) End Diastolic Velocities (cm/s) Plaque  65 12 - CCA PROXIMAL 51 15 -  70 14 - CCA MID 36 11 -  60 12 HT CCA DISTAL 37 12 -  60 7 - ECA Ligated - -  44 14 HT ICA PROXIMAL 40 12 -  62 20 - ICA MID 41 13 -  52 20 - ICA DISTAL 49 15 -    .88 ICA / CCA Ratio (PSV) N/A  Antegrade Vertebral Flow Antegrade  563 Brachial Systolic Pressure (mmHg) 875  Triphasic Brachial Artery Waveforms Triphasic    Plaque Morphology:  HM = Homogeneous, HT = Heterogeneous, CP = Calcific Plaque, SP = Smooth Plaque, IP = Irregular Plaque   ADDITIONAL FINDINGS: Proximal graft 45 / 13 cms, distal graft 45/ 13 cms    IMPRESSION: 1. 1 - 49% right internal carotid artery stenosis, lower end of range 2. Patent left common carotid to internal carotid artery bypass graft with no evidence for restenosis.    Compared to the previous exam:  No change      Assessment: Darrell [name] is a 68 y.o. male who is s/p left carotid endarterectomy in February of 2013 which acutely thrombosed a few hours postoperatively and required return to the OR with insertion of a common to internal carotid saphenous vein graft. He suffered a left brain CVA. He has had significant improvement in his neurologic status. He has had no further stroke or TIA activity since 2013. Today's carotid Duplex suggests 1 -  49% right internal carotid artery stenosis, lower end of range, and patent left common carotid to internal carotid artery bypass graft with no evidence for restenosis. The  ICA stenosis is  Unchanged from previous Duplex on 02/13/14.  Plan: Follow-up in 1 year with Carotid Duplex.   I discussed in depth with the patient the nature of atherosclerosis, and emphasized the importance of maximal medical management including strict control of blood pressure, blood glucose, and lipid levels, obtaining regular exercise, and continued cessation of smoking.  The patient is aware that without maximal medical management the underlying atherosclerotic disease process will progress, limiting the benefit of any interventions. The patient was given information about stroke prevention and what symptoms should prompt the patient to seek immediate medical care. Thank you for allowing [place] to participate in this patient's care.  Clemon Chambers, RN, MSN, FNP-C [facility] Office: [facility] Physician: Trula Slade  03/18/2015 3:19 PM

## 2015-03-18 NOTE — Patient Instructions (Signed)
Stroke Prevention Some medical conditions and behaviors are associated with an increased chance of having a stroke. You may prevent a stroke by making healthy choices and managing medical conditions. HOW CAN I REDUCE MY RISK OF HAVING A STROKE?   Stay physically active. Get at least 30 minutes of activity on most or all days.  Do not smoke. It may also be helpful to avoid exposure to secondhand smoke.  Limit alcohol use. Moderate alcohol use is considered to be:  No more than 2 drinks per day for men.  No more than 1 drink per day for nonpregnant women.  Eat healthy foods. This involves:  Eating 5 or more servings of fruits and vegetables a day.  Making dietary changes that address high blood pressure (hypertension), high cholesterol, diabetes, or obesity.  Manage your cholesterol levels.  Making food choices that are high in fiber and low in saturated fat, trans fat, and cholesterol may control cholesterol levels.  Take any prescribed medicines to control cholesterol as directed by your health care provider.  Manage your diabetes.  Controlling your carbohydrate and sugar intake is recommended to manage diabetes.  Take any prescribed medicines to control diabetes as directed by your health care provider.  Control your hypertension.  Making food choices that are low in salt (sodium), saturated fat, trans fat, and cholesterol is recommended to manage hypertension.  Take any prescribed medicines to control hypertension as directed by your health care provider.  Maintain a healthy weight.  Reducing calorie intake and making food choices that are low in sodium, saturated fat, trans fat, and cholesterol are recommended to manage weight.  Stop drug abuse.  Avoid taking birth control pills.  Talk to your health care provider about the risks of taking birth control pills if you are over 35 years old, smoke, get migraines, or have ever had a blood clot.  Get evaluated for sleep  disorders (sleep apnea).  Talk to your health care provider about getting a sleep evaluation if you snore a lot or have excessive sleepiness.  Take medicines only as directed by your health care provider.  For some people, aspirin or blood thinners (anticoagulants) are helpful in reducing the risk of forming abnormal blood clots that can lead to stroke. If you have the irregular heart rhythm of atrial fibrillation, you should be on a blood thinner unless there is a good reason you cannot take them.  Understand all your medicine instructions.  Make sure that other conditions (such as anemia or atherosclerosis) are addressed. SEEK IMMEDIATE MEDICAL CARE IF:   You have sudden weakness or numbness of the face, arm, or leg, especially on one side of the body.  Your face or eyelid droops to one side.  You have sudden confusion.  You have trouble speaking (aphasia) or understanding.  You have sudden trouble seeing in one or both eyes.  You have sudden trouble walking.  You have dizziness.  You have a loss of balance or coordination.  You have a sudden, severe headache with no known cause.  You have new chest pain or an irregular heartbeat. Any of these symptoms may represent a serious problem that is an emergency. Do not wait to see if the symptoms will go away. Get medical help at once. Call your local emergency services (911 in [place] Do not drive yourself to the hospital. Document Released: 11/19/2004 Document Revised: 02/26/2014 Document Reviewed: 04/14/2013 Exit[employer] Patient Information 2015 [employer]. This information is not intended to replace advice given   to you by your health care provider. Make sure you discuss any questions you have with your health care provider.  

## 2015-03-19 NOTE — Addendum Note (Signed)
Addended by: Mena Goes on: 03/19/2015 04:12 PM   Modules accepted: Orders

## 2015-04-03 DIAGNOSIS — C44229 Squamous cell carcinoma of skin of left ear and external auricular canal: Secondary | ICD-10-CM | POA: Diagnosis not present

## 2015-04-03 DIAGNOSIS — D485 Neoplasm of uncertain behavior of skin: Secondary | ICD-10-CM | POA: Diagnosis not present

## 2015-04-03 DIAGNOSIS — L853 Xerosis cutis: Secondary | ICD-10-CM | POA: Diagnosis not present

## 2015-04-03 DIAGNOSIS — Z85828 Personal history of other malignant neoplasm of skin: Secondary | ICD-10-CM | POA: Diagnosis not present

## 2015-04-03 DIAGNOSIS — L570 Actinic keratosis: Secondary | ICD-10-CM | POA: Diagnosis not present

## 2015-04-03 DIAGNOSIS — D0421 Carcinoma in situ of skin of right ear and external auricular canal: Secondary | ICD-10-CM | POA: Diagnosis not present

## 2015-04-22 DIAGNOSIS — C44229 Squamous cell carcinoma of skin of left ear and external auricular canal: Secondary | ICD-10-CM | POA: Diagnosis not present

## 2015-04-22 DIAGNOSIS — Z85828 Personal history of other malignant neoplasm of skin: Secondary | ICD-10-CM | POA: Diagnosis not present

## 2015-05-08 DIAGNOSIS — C44311 Basal cell carcinoma of skin of nose: Secondary | ICD-10-CM | POA: Diagnosis not present

## 2015-05-08 DIAGNOSIS — Z85828 Personal history of other malignant neoplasm of skin: Secondary | ICD-10-CM | POA: Diagnosis not present

## 2015-05-08 DIAGNOSIS — L570 Actinic keratosis: Secondary | ICD-10-CM | POA: Diagnosis not present

## 2015-05-08 DIAGNOSIS — D485 Neoplasm of uncertain behavior of skin: Secondary | ICD-10-CM | POA: Diagnosis not present

## 2015-05-08 DIAGNOSIS — D0421 Carcinoma in situ of skin of right ear and external auricular canal: Secondary | ICD-10-CM | POA: Diagnosis not present

## 2015-06-17 DIAGNOSIS — C44311 Basal cell carcinoma of skin of nose: Secondary | ICD-10-CM | POA: Diagnosis not present

## 2015-06-17 DIAGNOSIS — Z85828 Personal history of other malignant neoplasm of skin: Secondary | ICD-10-CM | POA: Diagnosis not present

## 2015-07-22 DIAGNOSIS — Z23 Encounter for immunization: Secondary | ICD-10-CM | POA: Diagnosis not present

## 2015-07-26 ENCOUNTER — Other Ambulatory Visit: Payer: Self-pay | Admitting: Family Medicine

## 2015-08-06 DIAGNOSIS — D3131 Benign neoplasm of right choroid: Secondary | ICD-10-CM | POA: Diagnosis not present

## 2015-08-06 DIAGNOSIS — H2513 Age-related nuclear cataract, bilateral: Secondary | ICD-10-CM | POA: Diagnosis not present

## 2015-08-06 DIAGNOSIS — H40053 Ocular hypertension, bilateral: Secondary | ICD-10-CM | POA: Diagnosis not present

## 2015-08-06 DIAGNOSIS — E119 Type 2 diabetes mellitus without complications: Secondary | ICD-10-CM | POA: Diagnosis not present

## 2015-08-06 LAB — HM DIABETES EYE EXAM

## 2015-08-13 ENCOUNTER — Other Ambulatory Visit (INDEPENDENT_AMBULATORY_CARE_PROVIDER_SITE_OTHER): Payer: Medicare Other

## 2015-08-13 DIAGNOSIS — E139 Other specified diabetes mellitus without complications: Secondary | ICD-10-CM | POA: Diagnosis not present

## 2015-08-13 LAB — BASIC METABOLIC PANEL
BUN: 16 mg/dL (ref 6–23)
CALCIUM: 9.2 mg/dL (ref 8.4–10.5)
CO2: 32 meq/L (ref 19–32)
CREATININE: 0.9 mg/dL (ref 0.40–1.50)
Chloride: 103 mEq/L (ref 96–112)
GFR: 88.54 mL/min (ref >60.00)
Glucose, Bld: 115 mg/dL — ABNORMAL HIGH (ref 70–99)
Potassium: 3.5 mEq/L (ref 3.5–5.1)
Sodium: 139 mEq/L (ref 135–145)

## 2015-08-13 LAB — HEMOGLOBIN A1C: HEMOGLOBIN A1C: 6.6 % — AB (ref 4.6–6.5)

## 2015-08-20 ENCOUNTER — Ambulatory Visit (INDEPENDENT_AMBULATORY_CARE_PROVIDER_SITE_OTHER): Payer: Medicare Other | Admitting: Family Medicine

## 2015-08-20 ENCOUNTER — Encounter: Payer: Self-pay | Admitting: Family Medicine

## 2015-08-20 VITALS — BP 130/80 | Temp 98.5°F | Wt 209.0 lb

## 2015-08-20 DIAGNOSIS — R351 Nocturia: Secondary | ICD-10-CM | POA: Diagnosis not present

## 2015-08-20 DIAGNOSIS — N401 Enlarged prostate with lower urinary tract symptoms: Secondary | ICD-10-CM | POA: Diagnosis not present

## 2015-08-20 DIAGNOSIS — E139 Other specified diabetes mellitus without complications: Secondary | ICD-10-CM | POA: Diagnosis not present

## 2015-08-20 DIAGNOSIS — I6523 Occlusion and stenosis of bilateral carotid arteries: Secondary | ICD-10-CM

## 2015-08-20 NOTE — Patient Instructions (Signed)
Continue current medication diet and exercise  Return in 6 months for general physical exam  Fasting labs one week prior

## 2015-08-20 NOTE — Progress Notes (Signed)
   Subjective:    Patient ID: Darrell [name], male    DOB: [birth year], 69 y.o.   MRN: [id number]  HPI Darrell [name] is a 69 year old married male nonsmoker who comes in today for follow-up of diabetes type 2  He takes metformin 1000 mg twice a day blood sugars 110 range A1c 6.6%. He's also on lisinopril 20 mg daily BP 130/80.  He says overall he feels well no problems.  He still has the right hemiparesis is stable. He sees Dr. Kellie Simmering on a regular basis for carotid follow-up.   Review of Systems    review of systems negative Objective:   Physical Exam  Well-developed well-nourished male no acute distress vital signs stable he is afebrile      Assessment & Plan:  Diabetes type 2 at goal....... continue current therapy  Hypertension at goal....... continue current therapy  History of mild depression......... continue Celexa

## 2015-08-20 NOTE — Progress Notes (Signed)
Pre visit review using our clinic review tool, if applicable. No additional management support is needed unless otherwise documented below in the visit note. 

## 2015-08-21 ENCOUNTER — Encounter: Payer: Self-pay | Admitting: Family Medicine

## 2015-08-22 DIAGNOSIS — H40053 Ocular hypertension, bilateral: Secondary | ICD-10-CM | POA: Diagnosis not present

## 2015-09-12 DIAGNOSIS — H40053 Ocular hypertension, bilateral: Secondary | ICD-10-CM | POA: Diagnosis not present

## 2015-09-13 DIAGNOSIS — L570 Actinic keratosis: Secondary | ICD-10-CM | POA: Diagnosis not present

## 2015-09-13 DIAGNOSIS — Z85828 Personal history of other malignant neoplasm of skin: Secondary | ICD-10-CM | POA: Diagnosis not present

## 2015-10-01 DIAGNOSIS — H40051 Ocular hypertension, right eye: Secondary | ICD-10-CM | POA: Diagnosis not present

## 2015-10-01 DIAGNOSIS — H401122 Primary open-angle glaucoma, left eye, moderate stage: Secondary | ICD-10-CM | POA: Diagnosis not present

## 2015-10-01 DIAGNOSIS — H2513 Age-related nuclear cataract, bilateral: Secondary | ICD-10-CM | POA: Diagnosis not present

## 2015-10-12 ENCOUNTER — Emergency Department (HOSPITAL_COMMUNITY): Payer: Medicare Other

## 2015-10-12 ENCOUNTER — Inpatient Hospital Stay (HOSPITAL_COMMUNITY)
Admission: EM | Admit: 2015-10-12 | Discharge: 2015-10-14 | DRG: 065 | Disposition: A | Discharge status: Rehabilitation Facility | Payer: Medicare Other | Attending: Internal Medicine | Admitting: Internal Medicine

## 2015-10-12 ENCOUNTER — Inpatient Hospital Stay (HOSPITAL_COMMUNITY): Payer: Medicare Other

## 2015-10-12 ENCOUNTER — Encounter (HOSPITAL_COMMUNITY): Payer: Self-pay | Admitting: Emergency Medicine

## 2015-10-12 DIAGNOSIS — I739 Peripheral vascular disease, unspecified: Secondary | ICD-10-CM | POA: Diagnosis present

## 2015-10-12 DIAGNOSIS — Z87891 Personal history of nicotine dependence: Secondary | ICD-10-CM

## 2015-10-12 DIAGNOSIS — I639 Cerebral infarction, unspecified: Secondary | ICD-10-CM | POA: Diagnosis not present

## 2015-10-12 DIAGNOSIS — R9389 Abnormal findings on diagnostic imaging of other specified body structures: Secondary | ICD-10-CM | POA: Diagnosis present

## 2015-10-12 DIAGNOSIS — Z66 Do not resuscitate: Secondary | ICD-10-CM | POA: Diagnosis present

## 2015-10-12 DIAGNOSIS — R531 Weakness: Secondary | ICD-10-CM | POA: Diagnosis present

## 2015-10-12 DIAGNOSIS — R269 Unspecified abnormalities of gait and mobility: Secondary | ICD-10-CM | POA: Diagnosis present

## 2015-10-12 DIAGNOSIS — R278 Other lack of coordination: Secondary | ICD-10-CM

## 2015-10-12 DIAGNOSIS — I10 Essential (primary) hypertension: Secondary | ICD-10-CM | POA: Diagnosis not present

## 2015-10-12 DIAGNOSIS — R471 Dysarthria and anarthria: Secondary | ICD-10-CM | POA: Diagnosis present

## 2015-10-12 DIAGNOSIS — E139 Other specified diabetes mellitus without complications: Secondary | ICD-10-CM | POA: Diagnosis not present

## 2015-10-12 DIAGNOSIS — G8191 Hemiplegia, unspecified affecting right dominant side: Secondary | ICD-10-CM | POA: Diagnosis not present

## 2015-10-12 DIAGNOSIS — Z6829 Body mass index (BMI) 29.0-29.9, adult: Secondary | ICD-10-CM | POA: Diagnosis not present

## 2015-10-12 DIAGNOSIS — E119 Type 2 diabetes mellitus without complications: Secondary | ICD-10-CM | POA: Diagnosis present

## 2015-10-12 DIAGNOSIS — E669 Obesity, unspecified: Secondary | ICD-10-CM | POA: Diagnosis present

## 2015-10-12 DIAGNOSIS — I69351 Hemiplegia and hemiparesis following cerebral infarction affecting right dominant side: Secondary | ICD-10-CM

## 2015-10-12 DIAGNOSIS — Z7984 Long term (current) use of oral hypoglycemic drugs: Secondary | ICD-10-CM | POA: Diagnosis not present

## 2015-10-12 DIAGNOSIS — F329 Major depressive disorder, single episode, unspecified: Secondary | ICD-10-CM | POA: Diagnosis not present

## 2015-10-12 DIAGNOSIS — I6930 Unspecified sequelae of cerebral infarction: Secondary | ICD-10-CM | POA: Diagnosis not present

## 2015-10-12 DIAGNOSIS — G4733 Obstructive sleep apnea (adult) (pediatric): Secondary | ICD-10-CM | POA: Diagnosis present

## 2015-10-12 DIAGNOSIS — E876 Hypokalemia: Secondary | ICD-10-CM | POA: Diagnosis not present

## 2015-10-12 DIAGNOSIS — I69359 Hemiplegia and hemiparesis following cerebral infarction affecting unspecified side: Secondary | ICD-10-CM | POA: Diagnosis not present

## 2015-10-12 DIAGNOSIS — N401 Enlarged prostate with lower urinary tract symptoms: Secondary | ICD-10-CM | POA: Diagnosis not present

## 2015-10-12 DIAGNOSIS — E1142 Type 2 diabetes mellitus with diabetic polyneuropathy: Secondary | ICD-10-CM | POA: Diagnosis present

## 2015-10-12 DIAGNOSIS — E785 Hyperlipidemia, unspecified: Secondary | ICD-10-CM | POA: Diagnosis present

## 2015-10-12 DIAGNOSIS — M6289 Other specified disorders of muscle: Secondary | ICD-10-CM | POA: Diagnosis not present

## 2015-10-12 DIAGNOSIS — I6789 Other cerebrovascular disease: Secondary | ICD-10-CM | POA: Diagnosis not present

## 2015-10-12 DIAGNOSIS — Z79899 Other long term (current) drug therapy: Secondary | ICD-10-CM | POA: Diagnosis not present

## 2015-10-12 DIAGNOSIS — G8194 Hemiplegia, unspecified affecting left nondominant side: Secondary | ICD-10-CM | POA: Diagnosis present

## 2015-10-12 LAB — DIFFERENTIAL
BASOS ABS: 0 10*3/uL (ref 0.0–0.1)
BASOS PCT: 1 %
EOS ABS: 0.1 10*3/uL (ref 0.0–0.7)
Eosinophils Relative: 2 %
LYMPHS ABS: 1.4 10*3/uL (ref 0.7–4.0)
LYMPHS PCT: 19 %
Monocytes Absolute: 0.9 10*3/uL (ref 0.1–1.0)
Monocytes Relative: 12 %
NEUTROS PCT: 66 %
Neutro Abs: 4.9 10*3/uL (ref 1.7–7.7)

## 2015-10-12 LAB — URINALYSIS, ROUTINE W REFLEX MICROSCOPIC
Glucose, UA: NEGATIVE mg/dL
HGB URINE DIPSTICK: NEGATIVE
Ketones, ur: 15 mg/dL — AB
Nitrite: NEGATIVE
PH: 5.5 (ref 5.0–8.0)
Protein, ur: NEGATIVE mg/dL
SPECIFIC GRAVITY, URINE: 1.028 (ref 1.005–1.030)

## 2015-10-12 LAB — I-STAT CHEM 8, ED
BUN: 10 mg/dL (ref 6–20)
CHLORIDE: 104 mmol/L (ref 101–111)
CREATININE: 0.7 mg/dL (ref 0.61–1.24)
Calcium, Ion: 1.14 mmol/L (ref 1.13–1.30)
Glucose, Bld: 145 mg/dL — ABNORMAL HIGH (ref 65–99)
HEMATOCRIT: 47 % (ref 39.0–52.0)
Hemoglobin: 16 g/dL (ref 13.0–17.0)
POTASSIUM: 3.4 mmol/L — AB (ref 3.5–5.1)
Sodium: 142 mmol/L (ref 135–145)
TCO2: 24 mmol/L (ref 0–100)

## 2015-10-12 LAB — GLUCOSE, CAPILLARY: GLUCOSE-CAPILLARY: 195 mg/dL — AB (ref 65–99)

## 2015-10-12 LAB — URINE MICROSCOPIC-ADD ON: Bacteria, UA: NONE SEEN

## 2015-10-12 LAB — RAPID URINE DRUG SCREEN, HOSP PERFORMED
Amphetamines: NOT DETECTED
BENZODIAZEPINES: NOT DETECTED
Barbiturates: NOT DETECTED
COCAINE: NOT DETECTED
OPIATES: NOT DETECTED
Tetrahydrocannabinol: NOT DETECTED

## 2015-10-12 LAB — COMPREHENSIVE METABOLIC PANEL
ALT: 20 U/L (ref 17–63)
ANION GAP: 7 (ref 5–15)
AST: 17 U/L (ref 15–41)
Albumin: 3.3 g/dL — ABNORMAL LOW (ref 3.5–5.0)
Alkaline Phosphatase: 43 U/L (ref 38–126)
BUN: 8 mg/dL (ref 6–20)
CHLORIDE: 107 mmol/L (ref 101–111)
CO2: 25 mmol/L (ref 22–32)
CREATININE: 0.68 mg/dL (ref 0.61–1.24)
Calcium: 8.5 mg/dL — ABNORMAL LOW (ref 8.9–10.3)
Glucose, Bld: 149 mg/dL — ABNORMAL HIGH (ref 65–99)
POTASSIUM: 3.5 mmol/L (ref 3.5–5.1)
SODIUM: 139 mmol/L (ref 135–145)
Total Bilirubin: 1.2 mg/dL (ref 0.3–1.2)
Total Protein: 5.9 g/dL — ABNORMAL LOW (ref 6.5–8.1)

## 2015-10-12 LAB — CBC
HCT: 44.7 % (ref 39.0–52.0)
HEMOGLOBIN: 14.9 g/dL (ref 13.0–17.0)
MCH: 29.6 pg (ref 26.0–34.0)
MCHC: 33.3 g/dL (ref 30.0–36.0)
MCV: 88.7 fL (ref 78.0–100.0)
PLATELETS: 154 10*3/uL (ref 150–400)
RBC: 5.04 MIL/uL (ref 4.22–5.81)
RDW: 13.6 % (ref 11.5–15.5)
WBC: 7.3 10*3/uL (ref 4.0–10.5)

## 2015-10-12 LAB — I-STAT TROPONIN, ED: TROPONIN I, POC: 0.01 ng/mL (ref 0.00–0.08)

## 2015-10-12 LAB — CBG MONITORING, ED: GLUCOSE-CAPILLARY: 111 mg/dL — AB (ref 65–99)

## 2015-10-12 LAB — PROTIME-INR
INR: 1.11 (ref 0.00–1.49)
Prothrombin Time: 14.4 seconds (ref 11.6–15.2)

## 2015-10-12 LAB — ETHANOL: Alcohol, Ethyl (B): <5 mg/dL (ref <5)

## 2015-10-12 LAB — APTT: APTT: 31 s (ref 24–37)

## 2015-10-12 MED ORDER — INSULIN ASPART 100 UNIT/ML ~~LOC~~ SOLN
0.0000 [IU] | Freq: Three times a day (TID) | SUBCUTANEOUS | Status: DC
  Administered 2015-10-13 (×2): 1 [IU] via SUBCUTANEOUS
  Administered 2015-10-14: 2 [IU] via SUBCUTANEOUS
  Administered 2015-10-14: 1 [IU] via SUBCUTANEOUS

## 2015-10-12 MED ORDER — ASPIRIN 300 MG RE SUPP: 300.0000 mg | Freq: Every day | RECTAL | Status: DC

## 2015-10-12 MED ORDER — VITAMIN B-1 100 MG PO TABS
100.0000 mg | ORAL_TABLET | Freq: Every day | ORAL | Status: DC
  Administered 2015-10-12 – 2015-10-14 (×3): 100 mg via ORAL
  Filled 2015-10-12 (×3): qty 1

## 2015-10-12 MED ORDER — FOLIC ACID 1 MG PO TABS
1.0000 mg | ORAL_TABLET | Freq: Every day | ORAL | Status: DC
  Administered 2015-10-12 – 2015-10-14 (×3): 1 mg via ORAL
  Filled 2015-10-12 (×3): qty 1

## 2015-10-12 MED ORDER — LORAZEPAM 2 MG/ML IJ SOLN
1.0000 mg | INTRAMUSCULAR | Status: DC | PRN
  Administered 2015-10-12: 1 mg via INTRAVENOUS
  Filled 2015-10-12: qty 1

## 2015-10-12 MED ORDER — THIAMINE HCL 100 MG/ML IJ SOLN: 100.0000 mg | Freq: Every day | INTRAMUSCULAR | Status: DC

## 2015-10-12 MED ORDER — ENOXAPARIN SODIUM 30 MG/0.3ML ~~LOC~~ SOLN: 30.0000 mg | Freq: Two times a day (BID) | SUBCUTANEOUS | Status: DC

## 2015-10-12 MED ORDER — ENOXAPARIN SODIUM 40 MG/0.4ML ~~LOC~~ SOLN
40.0000 mg | SUBCUTANEOUS | Status: DC
  Administered 2015-10-12 – 2015-10-13 (×2): 40 mg via SUBCUTANEOUS
  Filled 2015-10-12 (×2): qty 0.4

## 2015-10-12 MED ORDER — CITALOPRAM HYDROBROMIDE 10 MG PO TABS
10.0000 mg | ORAL_TABLET | Freq: Every day | ORAL | Status: DC
  Administered 2015-10-13 – 2015-10-14 (×2): 10 mg via ORAL
  Filled 2015-10-12 (×2): qty 1

## 2015-10-12 MED ORDER — ADULT MULTIVITAMIN W/MINERALS CH
1.0000 | ORAL_TABLET | Freq: Every day | ORAL | Status: DC
  Administered 2015-10-12 – 2015-10-14 (×3): 1 via ORAL
  Filled 2015-10-12 (×3): qty 1

## 2015-10-12 MED ORDER — HYDRALAZINE HCL 20 MG/ML IJ SOLN
10.0000 mg | Freq: Four times a day (QID) | INTRAMUSCULAR | Status: DC | PRN
  Administered 2015-10-12: 10 mg via INTRAVENOUS
  Filled 2015-10-12: qty 1

## 2015-10-12 MED ORDER — LORAZEPAM 1 MG PO TABS
1.0000 mg | ORAL_TABLET | Freq: Four times a day (QID) | ORAL | Status: DC | PRN
Start: 2015-10-12 — End: 2015-10-14

## 2015-10-12 MED ORDER — STROKE: EARLY STAGES OF RECOVERY BOOK
Freq: Once | Status: AC
  Administered 2015-10-12: 23:00:00

## 2015-10-12 MED ORDER — SENNOSIDES-DOCUSATE SODIUM 8.6-50 MG PO TABS: 1.0000 | ORAL_TABLET | Freq: Every evening | ORAL | Status: DC | PRN

## 2015-10-12 MED ORDER — ASPIRIN 325 MG PO TABS
325.0000 mg | ORAL_TABLET | Freq: Every day | ORAL | Status: DC
  Administered 2015-10-12 – 2015-10-14 (×3): 325 mg via ORAL
  Filled 2015-10-12 (×3): qty 1

## 2015-10-12 MED ORDER — LORAZEPAM 2 MG/ML IJ SOLN
1.0000 mg | Freq: Four times a day (QID) | INTRAMUSCULAR | Status: DC | PRN
Start: 2015-10-12 — End: 2015-10-14

## 2015-10-12 NOTE — ED Provider Notes (Signed)
Patient seen and evaluated. Care discussed with Gloriann Loan PA-C.  Patient presents with left-sided weakness. Last time known normal 1600 p.m. yesterday, 10/11/2015.  Patient with history of left carotid endarterectomy and a operative left MCA stroke. He has chronic weakness of right upper extremity.  He and his wife were out of town yesterday. He has some difficulty walking due to weakness and had to sit. Lightheaded assist him to the car due to weakness in his left leg. Chronically uses a cane. They stopped at a restaurant and ate. He was unable to feed himself with his left arm due to loss of strength and coordination. Wife had to feed him. No dysphagia.  Return to [place]. He wanted to go home instead of to the hospital. Awakened this morning. Symptoms persist. Still having a great deal of difficulty ambulating due to weakness in his leg. Still does not feel normal with coordination of his left arm.  Is not currently taking aspirin or Plavix.  Sam shows dysmetria left upper extremity. Loss of strength to grip left upper extremity. Does not have drift of left upper arm. No facial palsies noted.  Patient out of window for acute lytic therapy. Plan will be CT and likely MRI. He is claustrophobic. Appropriate for small dose IV benzodiazepine prior to MRI.  Tanna Furry, MD 10/12/15 [phone]

## 2015-10-12 NOTE — Consult Note (Signed)
Referring Physician: Dr Jeneen Rinks, ED    Chief Complaint: left hemiparesis, new onset  HPI:                                                                                                                                         Darrell [name] is an 69 y.o. 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 At baseline patient has right HP, ambulates with a cane, and can perform the majority of his ADL without assistance.  Wife is at the bedside and tells me that yesterday afternoon her husband complained of having trouble walking. She said that they went to a restaurant and he had to be help out of the car, and while eating couldn't use the utensils properly with his left hand. She did not notice slurred speech, facial asymmetry, confusion, and he denies HA, vertigo, double vision, or visual impairment. Stated that he was advised to stop taking aspirin due to frequent skin bruising.  CT head and MRI brain were completed, personally reviewed, and there is evidence of an acute small infarct involving the right centrum semiovale.  Date last known well: 10/11/15 Time last known well: uncertain  tPA Given: no, late presentation   Past Medical History  Diagnosis Date  . Diabetes mellitus   . Hypertension     dr Sherren Mocha  . Sleep apnea     cpap      sleep study > 4 yrs; no longer wearing CPAP  . Stroke [facility])     rt side weakness, 2013    Past Surgical History  Procedure Laterality Date  . Heel spur surgery    . [place] surgery    . Endarterectomy  12/09/2011    Procedure: ENDARTERECTOMY CAROTID;  Surgeon: Tinnie Gens, MD;  Location: [facility] OR;  Service: Vascular;  Laterality: Left;  Left Carotid endarterectomy with dacron patch angioplasty  . Carotid endarterectomy  12/09/11    Left  . Hernia repair       x2  . Ankle surgery    . Inguinal hernia repair Left 01/22/2014    Procedure: LEFT INGUINAL HERNIA REPAIR WITH MESH;  Surgeon: [name], MD;  Location: [place];  Service: General;  Laterality: Left;  . Insertion of mesh Left 01/22/2014    Procedure: INSERTION OF MESH;  Surgeon: [name], MD;  Location: [place];  Service: General;  Laterality: Left;  . Colonoscopy      Family History  Problem Relation Age of Onset  . Heart disease Father   . Colon cancer Father     at age 29  . Esophageal cancer Neg Hx   . Rectal cancer Neg Hx   . Stomach  cancer Neg Hx    Social History:  reports that he quit smoking about 13 years ago. His smoking use included Cigarettes. He has a 75 pack-year smoking history. He has never used smokeless tobacco. He reports that he does not drink alcohol or use illicit drugs. Family history: no MS, epilepsy, or brain tumor Allergies:  Allergies  Allergen Reactions  . Oxycodone-Acetaminophen     REACTION: feels sick (patient reports taking acetaminophen without issue)     Medications:                                                                                                                           Scheduled:   ROS:                                                                                                                                       History obtained from wife, chart review and the patient  General ROS: negative for - chills, fatigue, fever, night sweats, weight gain or weight loss Psychological ROS: negative for - behavioral disorder, hallucinations, memory difficulties, mood swings or suicidal ideation Ophthalmic ROS: negative for - blurry vision, double vision, eye pain or loss of vision ENT ROS: negative for - epistaxis, nasal discharge, oral lesions, sore throat, tinnitus or vertigo Allergy and Immunology ROS: negative for - hives or itchy/watery eyes Hematological and Lymphatic ROS: negative for - bleeding problems, bruising or  swollen lymph nodes Endocrine ROS: negative for - galactorrhea, hair pattern changes, polydipsia/polyuria or temperature intolerance Respiratory ROS: negative for - cough, hemoptysis, shortness of breath or wheezing Cardiovascular ROS: negative for - chest pain, dyspnea on exertion, edema or irregular heartbeat Gastrointestinal ROS: negative for - abdominal pain, diarrhea, hematemesis, nausea/vomiting or stool incontinence Genito-Urinary ROS: negative for - dysuria, hematuria, incontinence or urinary frequency/urgency Musculoskeletal ROS: negative for - joint swelling Neurological ROS: as noted in HPI Dermatological ROS: negative for rash and skin lesion changes  Physical exam:  Constitutional: well developed, pleasant male in no apparent distress. Blood pressure 150/96, pulse 62, temperature 98.4 F (36.9 C), temperature source Oral, resp. rate 20, height _0  (1.778 m), weight 93.441 kg (206 lb), SpO2 93 %. Eyes: no jaundice or exophthalmos.  Head: normocephalic. Neck: supple, no bruits, no JVD. Cardiac: no murmurs. Lungs: clear. Abdomen: soft, no tender, no mass. Extremities: no edema, clubbing, or cyanosis.  Skin: no  rash  Neurologic Examination:                                                                                                      General: NAD Mental Status: Alert, oriented, thought content appropriate.  Speech fluent without evidence of aphasia.  Able to follow 3 step commands without difficulty. Cranial Nerves: II: Discs flat bilaterally; Visual fields grossly normal, pupils equal, round, reactive to light and accommodation III,IV, VI: ptosis not present, extra-ocular motions intact bilaterally V,VII: smile symmetric, facial light touch sensation normal bilaterally VIII: hearing normal bilaterally IX,X: uvula rises symmetrically XI: bilateral shoulder shrug XII: midline tongue extension without atrophy or fasciculations  Motor: Right arm weakness, old. Left  sided weakness arm>leg Tone and bulk:normal tone throughout; no atrophy noted Sensory: Pinprick and light touch intact throughout, bilaterally Deep Tendon Reflexes:  Right: Upper Extremity   Left: Upper extremity   biceps (C-5 to C-6) 2/4   biceps (C-5 to C-6) 2/4 tricep (C7) 2/4    triceps (C7) 2/4 Brachioradialis (C6) 2/4  Brachioradialis (C6) 2/4  Lower Extremity Lower Extremity  quadriceps (L-2 to L-4) 2/4   quadriceps (L-2 to L-4) 2/4 Achilles (S1) 2/4   Achilles (S1) 2/4  Plantars: Right: downgoing   Left: downgoing Cerebellar: normal finger-to-nose, heel-to-shin no tested Gait:  No tested due to multiple leads    Results for orders placed or performed during the hospital encounter of 10/12/15 (from the past 48 hour(s))  Ethanol     Status: None   Collection Time: 10/12/15 12:00 PM  Result Value Ref Range   Alcohol, Ethyl (B) <5 <5 mg/dL    Comment:        LOWEST DETECTABLE LIMIT FOR SERUM ALCOHOL IS 5 mg/dL FOR MEDICAL PURPOSES ONLY   Protime-INR     Status: None   Collection Time: 10/12/15 12:00 PM  Result Value Ref Range   Prothrombin Time 14.4 11.6 - 15.2 seconds   INR 1.11 0.00 - 1.49  APTT     Status: None   Collection Time: 10/12/15 12:00 PM  Result Value Ref Range   aPTT 31 24 - 37 seconds  CBC     Status: None   Collection Time: 10/12/15 12:00 PM  Result Value Ref Range   WBC 7.3 4.0 - 10.5 K/uL   RBC 5.04 4.22 - 5.81 MIL/uL   Hemoglobin 14.9 13.0 - 17.0 g/dL   HCT 44.7 39.0 - 52.0 %   MCV 88.7 78.0 - 100.0 fL   MCH 29.6 26.0 - 34.0 pg   MCHC 33.3 30.0 - 36.0 g/dL   RDW 13.6 11.5 - 15.5 %   Platelets 154 150 - 400 K/uL  Differential     Status: None   Collection Time: 10/12/15 12:00 PM  Result Value Ref Range   Neutrophils Relative % 66 %   Neutro Abs 4.9 1.7 - 7.7 K/uL   Lymphocytes Relative 19 %   Lymphs Abs 1.4 0.7 - 4.0 K/uL   Monocytes Relative 12 %   Monocytes Absolute 0.9 0.1 - 1.0 K/uL  Eosinophils Relative 2 %   Eosinophils  Absolute 0.1 0.0 - 0.7 K/uL   Basophils Relative 1 %   Basophils Absolute 0.0 0.0 - 0.1 K/uL  Comprehensive metabolic panel     Status: Abnormal   Collection Time: 10/12/15 12:00 PM  Result Value Ref Range   Sodium 139 135 - 145 mmol/L   Potassium 3.5 3.5 - 5.1 mmol/L   Chloride 107 101 - 111 mmol/L   CO2 25 22 - 32 mmol/L   Glucose, Bld 149 (H) 65 - 99 mg/dL   BUN 8 6 - 20 mg/dL   Creatinine, Ser 0.68 0.61 - 1.24 mg/dL   Calcium 8.5 (L) 8.9 - 10.3 mg/dL   Total Protein 5.9 (L) 6.5 - 8.1 g/dL   Albumin 3.3 (L) 3.5 - 5.0 g/dL   AST 17 15 - 41 U/L   ALT 20 17 - 63 U/L   Alkaline Phosphatase 43 38 - 126 U/L   Total Bilirubin 1.2 0.3 - 1.2 mg/dL   GFR calc non Af Amer >60 >60 mL/min   GFR calc Af Amer >60 >60 mL/min    Comment: (NOTE) The eGFR has been calculated using the CKD EPI equation. This calculation has not been validated in all clinical situations. eGFR's persistently <60 mL/min signify possible Chronic Kidney Disease.    Anion gap 7 5 - 15  I-stat troponin, ED (not at [facility])     Status: None   Collection Time: 10/12/15 12:19 PM  Result Value Ref Range   Troponin i, poc 0.01 0.00 - 0.08 ng/mL   Comment 3            Comment: Due to the release kinetics of cTnI, a negative result within the first hours of the onset of symptoms does not rule out myocardial infarction with certainty. If myocardial infarction is still suspected, repeat the test at appropriate intervals.   I-Stat Chem 8, ED  (not at [facility])     Status: Abnormal   Collection Time: 10/12/15 12:21 PM  Result Value Ref Range   Sodium 142 135 - 145 mmol/L   Potassium 3.4 (L) 3.5 - 5.1 mmol/L   Chloride 104 101 - 111 mmol/L   BUN 10 6 - 20 mg/dL   Creatinine, Ser 0.70 0.61 - 1.24 mg/dL   Glucose, Bld 145 (H) 65 - 99 mg/dL   Calcium, Ion 1.14 1.13 - 1.30 mmol/L   TCO2 24 0 - 100 mmol/L   Hemoglobin 16.0 13.0 - 17.0 g/dL   HCT 47.0 39.0 - 52.0 %  CBG monitoring, ED     Status: Abnormal    Collection Time: 10/12/15  1:24 PM  Result Value Ref Range   Glucose-Capillary 111 (H) 65 - 99 mg/dL  Urine rapid drug screen (hosp performed)not at [facility]     Status: None   Collection Time: 10/12/15  2:52 PM  Result Value Ref Range   Opiates NONE DETECTED NONE DETECTED   Cocaine NONE DETECTED NONE DETECTED   Benzodiazepines NONE DETECTED NONE DETECTED   Amphetamines NONE DETECTED NONE DETECTED   Tetrahydrocannabinol NONE DETECTED NONE DETECTED   Barbiturates NONE DETECTED NONE DETECTED    Comment:        DRUG SCREEN FOR MEDICAL PURPOSES ONLY.  IF CONFIRMATION IS NEEDED FOR ANY PURPOSE, NOTIFY LAB WITHIN 5 DAYS.        LOWEST DETECTABLE LIMITS FOR URINE DRUG SCREEN Drug Class       Cutoff (ng/mL) Amphetamine  1000 Barbiturate      200 Benzodiazepine   967 Tricyclics       591 Opiates          300 Cocaine          300 THC              50   Urinalysis, Routine w reflex microscopic (not at [facility])     Status: Abnormal   Collection Time: 10/12/15  2:52 PM  Result Value Ref Range   Color, Urine AMBER (A) YELLOW    Comment: BIOCHEMICALS MAY BE AFFECTED BY COLOR   APPearance CLEAR CLEAR   Specific Gravity, Urine 1.028 1.005 - 1.030   pH 5.5 5.0 - 8.0   Glucose, UA NEGATIVE NEGATIVE mg/dL   Hgb urine dipstick NEGATIVE NEGATIVE   Bilirubin Urine SMALL (A) NEGATIVE   Ketones, ur 15 (A) NEGATIVE mg/dL   Protein, ur NEGATIVE NEGATIVE mg/dL   Nitrite NEGATIVE NEGATIVE   Leukocytes, UA TRACE (A) NEGATIVE  Urine microscopic-add on     Status: Abnormal   Collection Time: 10/12/15  2:52 PM  Result Value Ref Range   Squamous Epithelial / LPF 0-5 (A) NONE SEEN   WBC, UA 0-5 0 - 5 WBC/hpf   RBC / HPF 0-5 0 - 5 RBC/hpf   Bacteria, UA NONE SEEN NONE SEEN   Urine-Other MUCOUS PRESENT    Dg Chest 2 View  10/12/2015  CLINICAL DATA:  Patient with weakness for 1 day. EXAM: CHEST  2 VIEW COMPARISON:  Chest radiograph 10/13/2013. FINDINGS: Stable enlarged cardiac and mediastinal contours.  Pulmonary vascular redistribution. No large area of pulmonary consolidation. No pleural effusion or pneumothorax. Probable healing posterior right eighth and ninth rib fractures. Thoracic spine degenerative changes. IMPRESSION: Cardiomegaly with pulmonary vascular redistribution. Probable healing posterior right rib fractures. Electronically Signed   By: Lovey Newcomer M.D.   On: 10/12/2015 13:19   Ct Head Wo Contrast  10/12/2015  CLINICAL DATA:  Left-sided weakness.  History of stroke. EXAM: CT HEAD WITHOUT CONTRAST TECHNIQUE: Contiguous axial images were obtained from the base of the skull through the vertex without intravenous contrast. COMPARISON:  10/12/2013 FINDINGS: Large area of chronic encephalomalacia related to prior stroke involving the left frontal and parietal lobes appears stable. Several old lacunar infarcts bilaterally or also stable involving the basal ganglia. The brain demonstrates no evidence of hemorrhage, acute infarction, edema, mass effect, extra-axial fluid collection, hydrocephalus or mass lesion. The skull is unremarkable. IMPRESSION: Stable appearance of the brain by head CT with old left-sided hemispheric infarct present as well as multiple old bilateral lacunar infarcts. No acute findings. Electronically Signed   By: Aletta Edouard M.D.   On: 10/12/2015 14:42     Assessment: 69 y.o. male with multiple risk factors for stroke, presents with left sided weakness that started yesterday. MRI brain demonstrated an acute infarction involving the right centrum semiovale, likely due to small vessel disease. Patient was not treated with iv thrombolysis due to late presentation. Admit to medicine .Complete stroke work up as delineated below. Stroke team will resume care tomorrow.  Stroke Risk Factors -age,  HTN, DM, prior stroke  Plan: 1. HgbA1c, fasting lipid panel 2. MRI, MRA  of the brain without contrast 3. Echocardiogram 4. Carotid dopplers 5. Prophylactic  therapy-aspirin 6. Risk factor modification 7. Telemetry monitoring 8. Frequent neuro checks 9. PT/OT SLP   Dorian Pod, MD [facility] [phone]  10/12/2015, 4:26 PM

## 2015-10-12 NOTE — ED Notes (Signed)
Per info given by Pt and wife  Pt has left sided weakness with previous CVA.

## 2015-10-12 NOTE — ED Notes (Signed)
CBG 111 

## 2015-10-12 NOTE — Progress Notes (Signed)
Pt is admitted to 5M01 from ED. Admission vital is stable. Pt is alerted and oriented X4

## 2015-10-12 NOTE — ED Notes (Signed)
Wife stated, he started having some leg weakness yesterday around 400pm, and today its worse. He woke up around 0900 and had trouble going to bathrroom, he hasn't eaten anything.  I had a hard time getting him to the car.

## 2015-10-12 NOTE — ED Provider Notes (Signed)
CSN: [id number]     Arrival date & time 10/12/15  1034 History   First MD Initiated Contact with Patient 10/12/15 1115     Chief Complaint  Patient presents with  . Cerebrovascular Accident     (Consider location/radiation/quality/duration/timing/severity/associated sxs/prior Treatment) Patient is a 69 y.o. male presenting with extremity weakness.  Extremity Weakness This is a new problem. The current episode started yesterday. The problem occurs constantly. The problem has been unchanged. Associated symptoms include coughing and weakness. Pertinent negatives include no abdominal pain, chest pain, fever, headaches, nausea, numbness, urinary symptoms, vertigo, visual change or vomiting. Nothing aggravates the symptoms. He has tried nothing for the symptoms.   Darrell [name] is a 69 y.o. male with PMH significant for DM, HTN, CVA (right sided weakness) who presents with left sided weakness that began approximately 4:00 PM yesterday.  Patient's wife reports he was having difficulty hold utencils last night while eating and then had difficulty walking to the car due to left sided weakness.  He normally ambulates with a cane.  Patient's wife also reports at the time he had slurred speech that has since resolved.  Denies HA, fever, SOB, dizziness, lightheadedness, N/V, abdominal pain, or urinary symptoms.  Denies any recent medication changes.  He reports recent "stomach bug" last week.  Past Medical History  Diagnosis Date  . Diabetes mellitus   . Hypertension     dr Sherren Mocha  . Sleep apnea     cpap      sleep study > 4 yrs; no longer wearing CPAP  . Stroke [facility])     rt side weakness, 2013   Past Surgical History  Procedure Laterality Date  . Heel spur surgery    . [place] surgery    . Endarterectomy  12/09/2011    Procedure: ENDARTERECTOMY CAROTID;  Surgeon: Tinnie Gens, MD;  Location: [facility] OR;  Service: Vascular;  Laterality: Left;  Left Carotid endarterectomy with dacron patch angioplasty   . Carotid endarterectomy  12/09/11    Left  . Hernia repair      x2  . Ankle surgery    . Inguinal hernia repair Left 01/22/2014    Procedure: LEFT INGUINAL HERNIA REPAIR WITH MESH;  Surgeon: [name], MD;  Location: [place];  Service: General;  Laterality: Left;  . Insertion of mesh Left 01/22/2014    Procedure: INSERTION OF MESH;  Surgeon: [name], MD;  Location: [place];  Service: General;  Laterality: Left;  . Colonoscopy     Family History  Problem Relation Age of Onset  . Heart disease Father   . Colon cancer Father     at age 60  . Esophageal cancer Neg Hx   . Rectal cancer Neg Hx   . Stomach cancer Neg Hx    Social History  Substance Use Topics  . Smoking status: Former Smoker -- 2.50 packs/day for 30 years    Types: Cigarettes    Quit date: 12/07/2001  . Smokeless tobacco: Never Used  . Alcohol Use: No    Review of Systems  Constitutional: Negative for fever.  Eyes: Negative for visual disturbance.  Respiratory: Positive for cough. Negative for shortness of breath.   Cardiovascular: Negative for chest pain.  Gastrointestinal: Negative for nausea, vomiting and abdominal pain.  Genitourinary: Negative for dysuria, frequency and hematuria.  Musculoskeletal: Positive for extremity weakness.  Neurological: Positive for speech difficulty (slurred speech) and weakness. Negative for dizziness, vertigo, facial asymmetry, light-headedness, numbness and headaches.  All other systems reviewed and are negative.     Allergies  Oxycodone-acetaminophen  Home Medications   Prior to Admission medications   Medication Sig Start Date End Date Taking? Authorizing Provider  citalopram (CELEXA) 10 MG tablet Take 1 tablet (10 mg total) by mouth daily. 02/18/15  Yes Dorena Cookey, MD  lisinopril (PRINIVIL,ZESTRIL) 20 MG tablet Take 1 tablet (20 mg total) by mouth daily. 02/18/15  Yes Dorena Cookey, MD  metFORMIN (GLUCOPHAGE) 1000 MG tablet take 1 tablet by mouth twice a day  with meals 02/18/15  Yes Dorena Cookey, MD  ONE [facility] ULTRA TEST test strip TEST once daily Patient not taking: Reported on 10/12/2015 07/26/15   Dorena Cookey, MD   BP 150/96 mmHg  Pulse 62  Temp(Src) 98.4 F (36.9 C) (Oral)  Resp 20  Ht 5\' 10"  (1.778 m)  Wt 93.441 kg  BMI 29.56 kg/m2  SpO2 93% Physical Exam  Constitutional: He is oriented to person, place, and time. He appears well-developed and well-nourished.  HENT:  Head: Normocephalic and atraumatic.  Mouth/Throat: Oropharynx is clear and moist.  Eyes: Conjunctivae are normal. Pupils are equal, round, and reactive to light.  Neck: Normal range of motion. Neck supple.  Cardiovascular: Normal rate, regular rhythm and normal heart sounds.   No murmur heard. Pulmonary/Chest: Effort normal and breath sounds normal. No accessory muscle usage or stridor. No respiratory distress. He has no wheezes. He has no rhonchi. He has no rales.  Abdominal: Soft. Bowel sounds are normal. He exhibits no distension. There is no tenderness.  Musculoskeletal: Normal range of motion.  Lymphadenopathy:    He has no cervical adenopathy.  Neurological: He is alert and oriented to person, place, and time.  Mental Status:   AOx3.  Speech clear without dysarthria. Cranial Nerves:  I-not tested  II-PERRLA  III, IV, VI-EOMs intact  V-temporal and masseter strength intact  VII-symmetrical facial movements intact, no facial droop  VIII-hearing grossly intact bilaterally  IX, X-gag intact  XI-strength of sternomastoid and trapezius muscles 5/5  XII-tongue midline Motor:   Good muscle bulk and tone  Strength 4/5 in RUE/RLE and 5/5 in LUE/LLE  Cerebellar--RAMs, finger to nose intact with mild dysmetria.  No pronator drift Sensory:  Intact in upper and lower extremities   Skin: Skin is warm and dry.  Psychiatric: He has a normal mood and affect. His behavior is normal.    ED Course  Procedures (including critical care time) Labs Review Labs  Reviewed  COMPREHENSIVE METABOLIC PANEL - Abnormal; Notable for the following:    Glucose, Bld 149 (*)    Calcium 8.5 (*)    Total Protein 5.9 (*)    Albumin 3.3 (*)    All other components within normal limits  I-STAT CHEM 8, ED - Abnormal; Notable for the following:    Potassium 3.4 (*)    Glucose, Bld 145 (*)    All other components within normal limits  CBG MONITORING, ED - Abnormal; Notable for the following:    Glucose-Capillary 111 (*)    All other components within normal limits  ETHANOL  PROTIME-INR  APTT  CBC  DIFFERENTIAL  URINE RAPID DRUG SCREEN, HOSP PERFORMED  URINALYSIS, ROUTINE W REFLEX MICROSCOPIC (NOT AT [facility])  Randolm Idol, ED    Imaging Review Dg Chest 2 View  10/12/2015  CLINICAL DATA:  Patient with weakness for 1 day. EXAM: CHEST  2 VIEW COMPARISON:  Chest radiograph 10/13/2013. FINDINGS: Stable enlarged cardiac and mediastinal  contours. Pulmonary vascular redistribution. No large area of pulmonary consolidation. No pleural effusion or pneumothorax. Probable healing posterior right eighth and ninth rib fractures. Thoracic spine degenerative changes. IMPRESSION: Cardiomegaly with pulmonary vascular redistribution. Probable healing posterior right rib fractures. Electronically Signed   By: Lovey Newcomer M.D.   On: 10/12/2015 13:19   Ct Head Wo Contrast  10/12/2015  CLINICAL DATA:  Left-sided weakness.  History of stroke. EXAM: CT HEAD WITHOUT CONTRAST TECHNIQUE: Contiguous axial images were obtained from the base of the skull through the vertex without intravenous contrast. COMPARISON:  10/12/2013 FINDINGS: Large area of chronic encephalomalacia related to prior stroke involving the left frontal and parietal lobes appears stable. Several old lacunar infarcts bilaterally or also stable involving the basal ganglia. The brain demonstrates no evidence of hemorrhage, acute infarction, edema, mass effect, extra-axial fluid collection, hydrocephalus or mass lesion. The  skull is unremarkable. IMPRESSION: Stable appearance of the brain by head CT with old left-sided hemispheric infarct present as well as multiple old bilateral lacunar infarcts. No acute findings. Electronically Signed   By: Aletta Edouard M.D.   On: 10/12/2015 14:42   I have personally reviewed and evaluated these images and lab results as part of my medical decision-making.   EKG Interpretation None      MDM   Final diagnoses:  Left-sided weakness  Dysmetria    Patient presents with left sided weakness that began yesterday approximately 4 PM.  He had difficulty ambulating and holding household objects.  Also, during that time period his wife reports an episode of slurred speech.  No CP, SOB, abdominal pain, or urinary symptoms.  VSS, NAD.  On exam, heart RRR, lungs CTAB, abdomen soft and benign.  Cranial nerves grossly intact.  Strength intact (L>R, baseline).  Sensation intact.  Mild dysmetria with finger to nose in the left hand.  No pronator drift.  Concern for TIA vs stroke vs infectious etiology.  Will initiate stroke work up.  EKG shows sinus rhythm with ventricular trigeminy, nonspecific IVCD w/ LAD and LVH.  Troponin 0.01. CXR shows cardiomegaly with pulmonary vascular redistribution.  Probable healing posterior right rib fractures. CBC unremarkable. CMP unremarkable. CT head shows stable appearance of bran by head CT with old left-sided hemispheric infarct present as well as multiple old bilateral lacunar infarcts.  No acute findings.  MR ordered and pending.  Will consult neurology.  Will admit for further stroke workup.  Case has been discussed with and seen by Dr. Jeneen Rinks who agrees with the above plan for admission.   Gloriann Loan, PA-C 10/12/15 1551  Tanna Furry, MD 10/13/15 [phone]

## 2015-10-12 NOTE — ED Notes (Signed)
Wife reports on Friday at 1600 Pt was unable to feed himself and wife had to feed the supper to PT. Pt was unable to hold a glass of water in the Lt hand. Wife reported slurred speech on Friday that resolved and wife reported facial droop that has resolved. Today wife reported Pt was unable to dress himself and Pt had increased weakness today.

## 2015-10-12 NOTE — H&P (Signed)
[facility] History and Physical  Darrell [name] [id number] DOB: 1946-08-27 DOA: 10/12/2015  Referring physician: Emergency Department PCP: Joycelyn Man, MD   CHIEF COMPLAINT:   Left sided weakness       HPI: Darrell [name] is a 69 y.o. male with a history of diabetes and CVA presenting to ED with left sided weakness. He had gait changes yesterday and also unable to feed himself at dinner last night. Restaurant staff had to help with getting him in the hospital.Wife also noticed that speech was slightly slurred last night. Weakness persisted this am, slurred speech had resolved. Patient's wife brought in to ED.         ED COURSE:           EDP called Neurology consult  Labs:   K+ 3.4 Trop 0.01 UDS negative  CXR:    Cardiomegaly with pulmonary vascular redistribution.     Head CT - old infarcts only     EKG:    Sinus rhythm Ventricular trigeminy Nonspecific IVCD with LAD Left ventricular hypertrophy  QT 428                 Medications  LORazepam (ATIVAN) injection 1 mg (not administered)    Review of Systems  Constitutional: Negative.   HENT: Negative.   Eyes: Negative.   Respiratory: Negative.   Cardiovascular: Negative.   Gastrointestinal: Negative.   Genitourinary: Negative.   Musculoskeletal: Negative.   Skin: Negative.   Neurological: Positive for focal weakness.  Endo/Heme/Allergies: Negative.   Psychiatric/Behavioral: Negative.    Past Medical History  Diagnosis Date  . Diabetes mellitus   . Hypertension     dr Sherren Mocha  . Sleep apnea     cpap      sleep study > 4 yrs; no longer wearing CPAP  . Stroke [facility])     rt side weakness, 2013   Past Surgical History  Procedure Laterality Date  . Heel spur surgery    . [place] surgery    . Endarterectomy  12/09/2011    Procedure: ENDARTERECTOMY CAROTID;  Surgeon: Tinnie Gens, MD;  Location: [facility] OR;  Service: Vascular;  Laterality: Left;  Left Carotid endarterectomy with dacron patch  angioplasty  . Carotid endarterectomy  12/09/11    Left  . Hernia repair      x2  . Ankle surgery    . Inguinal hernia repair Left 01/22/2014    Procedure: LEFT INGUINAL HERNIA REPAIR WITH MESH;  Surgeon: [name], MD;  Location: [place];  Service: General;  Laterality: Left;  . Insertion of mesh Left 01/22/2014    Procedure: INSERTION OF MESH;  Surgeon: [name], MD;  Location: [place];  Service: General;  Laterality: Left;  . Colonoscopy      SOCIAL HISTORY:  reports that he quit smoking about 13 years ago. His smoking use included Cigarettes. He has a 75 pack-year smoking history. He has never used smokeless tobacco. He reports that he does not drink alcohol or use illicit drugs. Lives: at home with   Assistive devices:   Uses cane for ambulation.   Allergies  Allergen Reactions  . Oxycodone-Acetaminophen     REACTION: feels sick (patient reports taking acetaminophen without issue)     Family History  Problem Relation Age of Onset  . Heart disease Father   . Colon cancer Father     at age 2  . Esophageal cancer Neg Hx   . Rectal cancer Neg  Hx   . Stomach cancer Neg Hx     Prior to Admission medications   Medication Sig Start Date End Date Taking? Authorizing Provider  citalopram (CELEXA) 10 MG tablet Take 1 tablet (10 mg total) by mouth daily. 02/18/15  Yes Dorena Cookey, MD  lisinopril (PRINIVIL,ZESTRIL) 20 MG tablet Take 1 tablet (20 mg total) by mouth daily. 02/18/15  Yes Dorena Cookey, MD  metFORMIN (GLUCOPHAGE) 1000 MG tablet take 1 tablet by mouth twice a day with meals 02/18/15  Yes Dorena Cookey, MD  ONE [facility] ULTRA TEST test strip TEST once daily Patient not taking: Reported on 10/12/2015 07/26/15   Dorena Cookey, MD   PHYSICAL EXAM: Filed Vitals:   10/12/15 1044 10/12/15 1048 10/12/15 1453  BP: 128/89  150/96  Pulse: 84  62  Temp: 98.4 F (36.9 C)    TempSrc: Oral    Resp: 16  20  Height: 5\' 10"  (1.778 m)    Weight: 93.441 kg (206 lb)    SpO2:  96%  93%    Wt Readings from Last 3 Encounters:  10/12/15 93.441 kg (206 lb)  08/20/15 94.802 kg (209 lb)  03/18/15 94.802 kg (209 lb)    General:  Pleasant white male. Appears calm and comfortable Eyes: PER, normal lids, irises & conjunctiva ENT: grossly normal hearing, lips & tongue Neck: no LAD, no masses Cardiovascular: RRR, no murmurs. No LE edema.  Respiratory: Respirations even and unlabored. Normal respiratory effort. Lungs CTA bilaterally, no wheezes / rales .   Abdomen: soft, non-distended, non-tender, active bowel sounds. No obvious masses.  Skin: no rash seen on limited exam Musculoskeletal: grossly normal tone BUE/BLE Psychiatric: grossly normal mood and affect, speech fluent and appropriate Neurologic:  Bilateral upper extremity weakness R> L. Chronic RUE weakness, positive right finger to nose test. Good BLE strength.         LABS ON ADMISSION:    Basic Metabolic Panel:  Recent Labs Lab 10/12/15 1200 10/12/15 1221  NA 139 142  K 3.5 3.4*  CL 107 104  CO2 25  --   GLUCOSE 149* 145*  BUN 8 10  CREATININE 0.68 0.70  CALCIUM 8.5*  --    Liver Function Tests:  Recent Labs Lab 10/12/15 1200  AST 17  ALT 20  ALKPHOS 43  BILITOT 1.2  PROT 5.9*  ALBUMIN 3.3*    CBC:  Recent Labs Lab 10/12/15 1200 10/12/15 1221  WBC 7.3  --   NEUTROABS 4.9  --   HGB 14.9 16.0  HCT 44.7 47.0  MCV 88.7  --   PLT 154  --     CBG:  Recent Labs Lab 10/12/15 1324  GLUCAP 111*    CREATININE: 0.7 (10/12/15 1221) Estimated creatinine clearance - 98.7 mL/min  Radiological Exams on Admission: Dg Chest 2 View  10/12/2015  CLINICAL DATA:  Patient with weakness for 1 day. EXAM: CHEST  2 VIEW COMPARISON:  Chest radiograph 10/13/2013. FINDINGS: Stable enlarged cardiac and mediastinal contours. Pulmonary vascular redistribution. No large area of pulmonary consolidation. No pleural effusion or pneumothorax. Probable healing posterior right eighth and ninth rib fractures.  Thoracic spine degenerative changes. IMPRESSION: Cardiomegaly with pulmonary vascular redistribution. Probable healing posterior right rib fractures. Electronically Signed   By: Lovey Newcomer M.D.   On: 10/12/2015 13:19   Ct Head Wo Contrast  10/12/2015  CLINICAL DATA:  Left-sided weakness.  History of stroke. EXAM: CT HEAD WITHOUT CONTRAST TECHNIQUE: Contiguous axial images were obtained from  the base of the skull through the vertex without intravenous contrast. COMPARISON:  10/12/2013 FINDINGS: Large area of chronic encephalomalacia related to prior stroke involving the left frontal and parietal lobes appears stable. Several old lacunar infarcts bilaterally or also stable involving the basal ganglia. The brain demonstrates no evidence of hemorrhage, acute infarction, edema, mass effect, extra-axial fluid collection, hydrocephalus or mass lesion. The skull is unremarkable. IMPRESSION: Stable appearance of the brain by head CT with old left-sided hemispheric infarct present as well as multiple old bilateral lacunar infarcts. No acute findings. Electronically Signed   By: Aletta Edouard M.D.   On: 10/12/2015 14:42   Mr Brain Wo Contrast (neuro Protocol)  10/12/2015  CLINICAL DATA:  New onset weakness yesterday. Left upper extremity weakness. EXAM: MRI HEAD WITHOUT CONTRAST TECHNIQUE: Multiplanar, multiecho pulse sequences of the brain and surrounding structures were obtained without intravenous contrast. COMPARISON:  CT head without contrast 10/12/2015. MRI brain 12/09/2011. FINDINGS: An acute nonhemorrhagic 11 mm linear infarct is present in the right centrum semiovale. No other acute infarct is present. A large remote left MCA infarct is noted. Separate from the infarct territories, periventricular T2 changes have progressed bilaterally. Multiple remote lacunar infarcts are again noted in the right basal ganglia. New left-sided basal ganglia lacunar infarcts are present since the last MRI. These are not  acute. There is ex vacuo dilation of the left lateral ventricle. White matter changes extend into the brainstem. Remote lacunar infarcts in the cerebellum bilaterally are stable. PE internal auditory canals are normal bilaterally. Flow is present in the major intracranial arteries. Globes and orbits are intact. The paranasal sinuses and mastoid air cells are clear. IMPRESSION: 1. 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 2. Expected progression describes that expected evolution of large left MCA territory nonhemorrhagic infarct. 3. Progressive remote lacunar infarcts in the basal ganglia on the left. 4. Stable remote lacunar infarcts in the basal ganglia on the right. 5. Progressive periventricular white matter changes bilaterally. These results were called by telephone at the time of interpretation on 10/12/2015 at 4:47 pm to Dr. Jeneen Rinks, who verbally acknowledged these results. Electronically Signed   By: San Morelle M.D.   On: 10/12/2015 16:48    ASSESSMENT / PLAN   CVA. MRI reveals an acute nonhemorrhagic white matter infarct in right centrum semiovale . -admit to telemetry --frequent Neuro checks -Neurology at bedside evaluating now.  -delayed presentation so doubt candidate for TPA -ASA therapy -MRA head / brain without contrast -echocardiogram, carotid doppler studies, lipids and hgb A1c -PT, OT. SLP evaluation    Diabetes 1.5, managed as type 2. -hold home Metformin -monitor CBGs and start SSI  Essential hypertension, stable.  -hold home anti-hypertensive-permissive hypertension in setting of CVA -Hydralazine prn SBP >210 and / or DBP > 110  Abnormal CXR. Cardiomegaly with pulmonary vascular redistribution. He may have some mild volume overload. No associated dyspnea.   CONSULTANTS:   Neurology - Dr. Armida Sans  Code Status: DNR DVT Prophylaxis: Lovenox  Family Communication:  Patient alert, oriented and understands  plan of care.  Disposition Plan: Discharge to home or Rehab (based on clinical course) in 3-4 days   Time spent: 60 minutes Darrell Savoy  NP [facility] Pager [phone]   Attending MD note  Patient was seen, examined,treatment plan was discussed with the  Advance Practice Provider.  I have personally reviewed the clinical findings, lab,EKG, imaging studies and management of this patient in detail.I  have also reviewed the orders written for this patient which were under my direction. I agree with the documentation, as recorded by the Advance Practice Provider.   Darrell [name] is a 69 y.o. male with diabetes, CVA  who presented with left sided weakness, gait instability, slurred speech.  MRI brain showed acute nonhemorrhagic white matter infarct in the right centrum. BP 154/104 mmHg  Pulse 82  Temp(Src) 98.4 F (36.9 C) (Oral)  Resp 20  Ht 5\' 10"  (1.778 m)  Wt 93.441 kg (206 lb)  BMI 29.56 kg/m2  SpO2 94% Labs and imagings reviewed by myself. K 3.4 Physical exam unremarkable except : left sided weakness arm>leg, old chronic right arm weakness Agree with assessment and plan, discussed in detail with Ms. Darrell [name]. MRI positive for acute CVA. Patient to be admitted for full stroke work-up, MRA, 2decho, carotid doppler, PT/OT/ST eval. Neurology consulted.   Darrell [name] M.D. [facility] 10/12/2015, 5:40 PM Pager: [phone]  If 7PM-7AM, please contact night-coverage [email or web address] Password [email or web address]

## 2015-10-13 ENCOUNTER — Inpatient Hospital Stay (HOSPITAL_COMMUNITY): Payer: Medicare Other

## 2015-10-13 DIAGNOSIS — I10 Essential (primary) hypertension: Secondary | ICD-10-CM

## 2015-10-13 DIAGNOSIS — M6289 Other specified disorders of muscle: Secondary | ICD-10-CM

## 2015-10-13 DIAGNOSIS — I639 Cerebral infarction, unspecified: Principal | ICD-10-CM

## 2015-10-13 DIAGNOSIS — I6789 Other cerebrovascular disease: Secondary | ICD-10-CM

## 2015-10-13 LAB — CBC
HEMATOCRIT: 44.4 % (ref 39.0–52.0)
Hemoglobin: 15 g/dL (ref 13.0–17.0)
MCH: 29.9 pg (ref 26.0–34.0)
MCHC: 33.8 g/dL (ref 30.0–36.0)
MCV: 88.6 fL (ref 78.0–100.0)
Platelets: 161 10*3/uL (ref 150–400)
RBC: 5.01 MIL/uL (ref 4.22–5.81)
RDW: 13.5 % (ref 11.5–15.5)
WBC: 8.6 10*3/uL (ref 4.0–10.5)

## 2015-10-13 LAB — LIPID PANEL
CHOL/HDL RATIO: 4.5 ratio
Cholesterol: 76 mg/dL (ref 0–200)
HDL: 17 mg/dL — ABNORMAL LOW (ref >40)
LDL CALC: 40 mg/dL (ref 0–99)
Triglycerides: 96 mg/dL (ref <150)
VLDL: 19 mg/dL (ref 0–40)

## 2015-10-13 LAB — GLUCOSE, CAPILLARY
GLUCOSE-CAPILLARY: 124 mg/dL — AB (ref 65–99)
Glucose-Capillary: 141 mg/dL — ABNORMAL HIGH (ref 65–99)
Glucose-Capillary: 116 mg/dL — ABNORMAL HIGH (ref 65–99)
Glucose-Capillary: 125 mg/dL — ABNORMAL HIGH (ref 65–99)

## 2015-10-13 LAB — MAGNESIUM: MAGNESIUM: 2.1 mg/dL (ref 1.7–2.4)

## 2015-10-13 LAB — BASIC METABOLIC PANEL
Anion gap: 8 (ref 5–15)
BUN: 10 mg/dL (ref 6–20)
CALCIUM: 8.7 mg/dL — AB (ref 8.9–10.3)
CO2: 24 mmol/L (ref 22–32)
CREATININE: 0.69 mg/dL (ref 0.61–1.24)
Chloride: 105 mmol/L (ref 101–111)
GFR calc Af Amer: >60 mL/min (ref >60)
GFR calc non Af Amer: >60 mL/min (ref >60)
GLUCOSE: 127 mg/dL — AB (ref 65–99)
Potassium: 3.2 mmol/L — ABNORMAL LOW (ref 3.5–5.1)
Sodium: 137 mmol/L (ref 135–145)

## 2015-10-13 MED ORDER — LATANOPROST 0.005 % OP SOLN
1.0000 [drp] | Freq: Every day | OPHTHALMIC | Status: DC
  Administered 2015-10-13: 1 [drp] via OPHTHALMIC
  Filled 2015-10-13: qty 2.5

## 2015-10-13 MED ORDER — POTASSIUM CHLORIDE CRYS ER 20 MEQ PO TBCR
40.0000 meq | EXTENDED_RELEASE_TABLET | Freq: Once | ORAL | Status: AC
  Administered 2015-10-13: 40 meq via ORAL
  Filled 2015-10-13: qty 2

## 2015-10-13 NOTE — Progress Notes (Signed)
  Echocardiogram 2D Echocardiogram has been performed.  Darrell [name] M 10/13/2015, 2:15 PM

## 2015-10-13 NOTE — Progress Notes (Signed)
PROGRESS NOTE    Darrell [name] [id number] DOB: 01-10-1946 DOA: 10/12/2015 PCP: Joycelyn Man, MD  HPI/Brief narrative 69 year old male with history of DM, CVA with residual right hemiparesis, HTN, OSA not on CPAP, presented to [facility] on 10/12/15 with left-sided weakness, gait abnormality, slurred speech and inability to feed himself. MRI brain confirmed acute stroke. Neurology consulted.   Assessment/Plan:   Acute right brain stroke - Resultant dysarthria and left hemiparesis - MRI brain: Acute nonhemorrhagic white matter infarct in the right centrum semiovale measuring 11 mm maximally. - MRA brain: Mild medium in distal small vessel disease without significant proximal stenosis, aneurysm or branch vessel occlusion - Carotid Dopplers: Pending - 2-D echo: LVEF 55-60 percent and grade 1 diastolic dysfunction. No cardiac source of emboli was identified. - LDL 40 - A1c: 6.6 on 08/13/15 - Not on aspirin for several years-had stopped taking due to bruising. Now on aspirin 325 MG daily -   PT and OT recommend CIR  - Neurology follow-up appreciated   Type II DM, controlled  - Metformin on hold. Continue SSI.    Essential hypertension  - Allow for permissive hypertension.   Hypokalemia - Replace and follow.   DVT prophylaxis: Lovenox  Code Status: DO NOT RESUSCITATE  Family Communication: discussed extensively with spouse at bedside on 12/18  Disposition Plan: possible DC 12/19, pending CIR input    Consultants:  Neurology   Procedures:  2-D echo 10/13/15: Study Conclusions  - Left ventricle: The cavity size was mildly dilated. There was mild concentric hypertrophy. Systolic function was normal. The estimated ejection fraction was in the range of 55% to 60%. Wall motion was normal; there were no regional wall motion abnormalities. Doppler parameters are consistent with abnormal left ventricular relaxation (grade 1 diastolic dysfunction). -  Ascending aorta: The ascending aorta was mildly dilated measuring 41 mm. - Mitral valve: There was no regurgitation. - Left atrium: The atrium was normal in size. - Right ventricle: Systolic function was normal. - Right atrium: The atrium was normal in size. - Tricuspid valve: There was trivial regurgitation. - Pulmonic valve: There was no regurgitation. - Pulmonary arteries: Systolic pressure was within the normal range. - Inferior vena cava: The vessel was normal in size. - Pericardium, extracardiac: There was no pericardial effusion.  Impressions:  - No cardiac source of emboli was indentified.   Antibiotics:  None  Subjective: Feels better. Speech returned to normal. Still has left-sided weakness.   Objective: Filed Vitals:   10/13/15 0000 10/13/15 0200 10/13/15 0500 10/13/15 0659  BP: 126/98 126/66 156/92 170/83  Pulse: 75     Temp: 98.8 F (37.1 C) 99.1 F (37.3 C) 98.2 F (36.8 C)   TempSrc: Oral Oral Oral   Resp: 20 20 20 20   Height:      Weight:      SpO2: 94% 95%  96%    Intake/Output Summary (Last 24 hours) at 10/13/15 0737 Last data filed at 10/12/15 2100  Gross per 24 hour  Intake      0 ml  Output    300 ml  Net   -300 ml   Filed Weights   10/12/15 1044  Weight: 93.441 kg (206 lb)     Exam:  General exam: pleasant elderly male lying comfortably in bed.  Respiratory system: Clear. No increased work of breathing. Cardiovascular system: S1 & S2 heard, RRR. No JVD, murmurs, gallops, clicks or pedal edema. telemetry: Sinus rhythm with occasional PVCs. 12 beat NSSVT Gastrointestinal  system: Abdomen is nondistended, soft and nontender. Normal bowel sounds heard. Central nervous system: Alert and oriented. No focal neurological deficits. Extremities: Right upper extremity chronic weakness-grade 3 x 5 power with contractures. Left hemiparesis, grade 4 x 5 power. Right lower extremity graded 5 x 5 power.   Data Reviewed: Basic Metabolic  Panel:  Recent Labs Lab 10/12/15 1200 10/12/15 1221 10/13/15 0108 10/13/15 0258  NA 139 142  --  137  K 3.5 3.4*  --  3.2*  CL 107 104  --  105  CO2 25  --   --  24  GLUCOSE 149* 145*  --  127*  BUN 8 10  --  10  CREATININE 0.68 0.70  --  0.69  CALCIUM 8.5*  --   --  8.7*  MG  --   --  2.1  --    Liver Function Tests:  Recent Labs Lab 10/12/15 1200  AST 17  ALT 20  ALKPHOS 43  BILITOT 1.2  PROT 5.9*  ALBUMIN 3.3*   No results for input(s): LIPASE, AMYLASE in the last 168 hours. No results for input(s): AMMONIA in the last 168 hours. CBC:  Recent Labs Lab 10/12/15 1200 10/12/15 1221 10/13/15 0258  WBC 7.3  --  8.6  NEUTROABS 4.9  --   --   HGB 14.9 16.0 15.0  HCT 44.7 47.0 44.4  MCV 88.7  --  88.6  PLT 154  --  161   Cardiac Enzymes: No results for input(s): CKTOTAL, CKMB, CKMBINDEX, TROPONINI in the last 168 hours. BNP (last 3 results) No results for input(s): PROBNP in the last 8760 hours. CBG:  Recent Labs Lab 10/12/15 1324 10/12/15 2105 10/13/15 0625  GLUCAP 111* 195* 124*    No results found for this or any previous visit (from the past 240 hour(s)).       Studies: Dg Chest 2 View  10/12/2015  CLINICAL DATA:  Patient with weakness for 1 day. EXAM: CHEST  2 VIEW COMPARISON:  Chest radiograph 10/13/2013. FINDINGS: Stable enlarged cardiac and mediastinal contours. Pulmonary vascular redistribution. No large area of pulmonary consolidation. No pleural effusion or pneumothorax. Probable healing posterior right eighth and ninth rib fractures. Thoracic spine degenerative changes. IMPRESSION: Cardiomegaly with pulmonary vascular redistribution. Probable healing posterior right rib fractures. Electronically Signed   By: Lovey Newcomer M.D.   On: 10/12/2015 13:19   Ct Head Wo Contrast  10/12/2015  CLINICAL DATA:  Left-sided weakness.  History of stroke. EXAM: CT HEAD WITHOUT CONTRAST TECHNIQUE: Contiguous axial images were obtained from the base of the  skull through the vertex without intravenous contrast. COMPARISON:  10/12/2013 FINDINGS: Large area of chronic encephalomalacia related to prior stroke involving the left frontal and parietal lobes appears stable. Several old lacunar infarcts bilaterally or also stable involving the basal ganglia. The brain demonstrates no evidence of hemorrhage, acute infarction, edema, mass effect, extra-axial fluid collection, hydrocephalus or mass lesion. The skull is unremarkable. IMPRESSION: Stable appearance of the brain by head CT with old left-sided hemispheric infarct present as well as multiple old bilateral lacunar infarcts. No acute findings. Electronically Signed   By: Aletta Edouard M.D.   On: 10/12/2015 14:42   Mr Brain Wo Contrast (neuro Protocol)  10/12/2015  CLINICAL DATA:  New onset weakness yesterday. Left upper extremity weakness. EXAM: MRI HEAD WITHOUT CONTRAST TECHNIQUE: Multiplanar, multiecho pulse sequences of the brain and surrounding structures were obtained without intravenous contrast. COMPARISON:  CT head without contrast 10/12/2015. MRI brain 12/09/2011.  FINDINGS: An acute nonhemorrhagic 11 mm linear infarct is present in the right centrum semiovale. No other acute infarct is present. A large remote left MCA infarct is noted. Separate from the infarct territories, periventricular T2 changes have progressed bilaterally. Multiple remote lacunar infarcts are again noted in the right basal ganglia. New left-sided basal ganglia lacunar infarcts are present since the last MRI. These are not acute. There is ex vacuo dilation of the left lateral ventricle. White matter changes extend into the brainstem. Remote lacunar infarcts in the cerebellum bilaterally are stable. PE internal auditory canals are normal bilaterally. Flow is present in the major intracranial arteries. Globes and orbits are intact. The paranasal sinuses and mastoid air cells are clear. IMPRESSION: 1. Acute nonhemorrhagic white matter  infarct in the right centrum semi of ally measures 11 mm maximally. 2. Expected progression describes that expected evolution of large left MCA territory nonhemorrhagic infarct. 3. Progressive remote lacunar infarcts in the basal ganglia on the left. 4. Stable remote lacunar infarcts in the basal ganglia on the right. 5. Progressive periventricular white matter changes bilaterally. These results were called by telephone at the time of interpretation on 10/12/2015 at 4:47 pm to Dr. Jeneen Rinks, who verbally acknowledged these results. Electronically Signed   By: San Morelle M.D.   On: 10/12/2015 16:48   Mr Jodene Nam Head/brain Wo Cm  10/12/2015  CLINICAL DATA:  New onset of left-sided weakness. Acute right periventricular white matter infarct. EXAM: MRA HEAD WITHOUT CONTRAST TECHNIQUE: Angiographic images of the Circle of Willis were obtained using MRA technique without intravenous contrast. COMPARISON:  MRI brain from the same day. FINDINGS: The internal carotid artery is are within normal limits shin high cervical segments through the ICA termini bilaterally. The A1 and M1 segments are normal. The MCA bifurcations are intact. There is mild attenuation of distal MCA branch vessels bilaterally. The vertebral arteries are codominant. The left PICA origin is visualized and normal. The right PICA origin is not visualized. The basilar artery is normal. Both posterior cerebral arteries originate from the basilar tip. The PCA branch vessels are intact. IMPRESSION: 1. Mild medium and distal small vessel disease without significant proximal stenosis, aneurysm, or branch vessel occlusion. Electronically Signed   By: San Morelle M.D.   On: 10/12/2015 19:06        Scheduled Meds: . aspirin  300 mg Rectal Daily   Or  . aspirin  325 mg Oral Daily  . citalopram  10 mg Oral Daily  . enoxaparin (LOVENOX) injection  40 mg Subcutaneous Q24H  . folic acid  1 mg Oral Daily  . insulin aspart  0-9 Units  Subcutaneous TID WC  . multivitamin with minerals  1 tablet Oral Daily  . thiamine  100 mg Oral Daily   Or  . thiamine  100 mg Intravenous Daily   Continuous Infusions:   Active Problems:   Diabetes 1.5, managed as type 2 ([place])   Essential hypertension   CVA (cerebral infarction)   Left-sided weakness   Abnormal CXR    Time spent: 30 minutes.    Vernell Leep, MD, FACP, FHM. [facility] Pager [phone]  If 7PM-7AM, please contact night-coverage [email or web address] Password [email or web address] 10/13/2015, 7:37 AM    LOS: 1 day

## 2015-10-13 NOTE — Progress Notes (Signed)
Occupational Therapy Evaluation Patient Details Name: Darrell [name] MRN: [id number] DOB: 1946/01/21 Today's Date: 10/13/2015    History of Present Illness Pt is a 69 y/o male who presents s/p episode of L-sided weakness. He reports gait changes and inability to feed himself dinner - restaurant staff had to assist him to the hospital. PMH includes prior stroke with residual R sided weakness. 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   Clinical Impression   PTA, pt mod I with ADL and mobility (used ?loftstrand crutch). Pt presents with significant functional decline due to new onset of  L sided weakness. Pt will benefit from rehab at CIR. Will see again this am to work on adapting utensils so increase independence with self feeding. Will follow acutely to address established goals.     Follow Up Recommendations  CIR;Supervision/Assistance - 24 hour    Equipment Recommendations  None recommended by OT    Recommendations for Other Services       Precautions / Restrictions Precautions Precautions: Fall Restrictions Weight Bearing Restrictions: No      Mobility Bed Mobility               General bed mobility comments: Pt OOB in chair  Transfers Overall transfer level: Needs assistance   Transfers: Sit to/from Stand;Stand Pivot Transfers Sit to Stand: Min assist Stand pivot transfers: Mod assist       General transfer comment: usually uses a "crutch" to ambulate.Holds with R UE. HHA with heavy lean to L. Mod A for mobility. Ataxic gait pattern    Balance Overall balance assessment: Needs assistance   Sitting balance-Leahy Scale: Fair       Standing balance-Leahy Scale: Poor                              ADL Overall ADL's : Needs assistance/impaired Eating/Feeding: Moderate assistance Eating/Feeding Details (indicate cue type and reason): able to complete hand to mouth pattern, but unable to hold utensils. Will need AE.  Grooming:  Maximal assistance   Upper Body Bathing: Maximal assistance   Lower Body Bathing: Maximal assistance   Upper Body Dressing : Maximal assistance   Lower Body Dressing: Maximal assistance   Toilet Transfer: Moderate assistance;Ambulation   Toileting- Clothing Manipulation and Hygiene: Maximal assistance       Functional mobility during ADLs: Moderate assistance;Cueing for safety;Cueing for sequencing (L bias)       Vision Vision Assessment?: No apparent visual deficits   Perception     Praxis Praxis Praxis tested?: Within functional limits    Pertinent Vitals/Pain Pain Assessment: No/denies pain     Hand Dominance Left   Extremity/Trunk Assessment Upper Extremity Assessment Upper Extremity Assessment: RUE deficits/detail;LUE deficits/detail RUE Deficits / Details: residual effects from prior CVA. able to use RUE as functional assist RUE Coordination: decreased fine motor;decreased gross motor LUE Deficits / Details: Weaker distally. Able to move out of synergy pattern. R hand gross grasp/release.Poor strength @ 3/5. Able to oppose thumb to ring and middle finger and index but poor strength. Unable to use functional pinch to pick up objects. Unable to pick up utensils or hold a cup.  No iin hand manipulation skills.  LUE Sensation: decreased light touch (appears impaired) LUE Coordination: decreased fine motor;decreased gross motor (por finger to nose; ataxic)   Lower Extremity Assessment Lower Extremity Assessment: Defer to PT evaluation   Cervical / Trunk Assessment Cervical / Trunk Assessment: Other  exceptions Cervical / Trunk Exceptions: L bias   Communication Communication Communication: No difficulties   Cognition   Behavior During Therapy: WFL for tasks assessed/performed Overall Cognitive Status: History of cognitive impairments - at baseline       Memory: Decreased short-term memory             General Comments       Exercises        Shoulder Instructions      Home Living Family/patient expects to be discharged to:: Private residence Living Arrangements: Spouse/significant other Available Help at Discharge: Family;Available 24 hours/day Type of Home: Other(Comment) (Townhome) Home Access: Stairs to enter [employer] of Steps: 1 Entrance Stairs-Rails: Right;Left;Can reach both Home Layout: Two level;Able to live on main level with bedroom/bathroom     Bathroom Shower/Tub: Occupational psychologist: Handicapped height Bathroom Accessibility: Yes How Accessible: Accessible via walker (wife thinks so) Home Equipment: Shower seat;Grab bars - tub/shower;Cane - single point;Toilet riser          Prior Functioning/Environment Level of Independence: Independent with assistive device(s)        Comments: Used cane occasionally but was independent with ADL's. (?loftstrand crutch)    OT Diagnosis: Generalized weakness;Cognitive deficits;Ataxia   OT Problem List: Decreased strength;Decreased range of motion;Decreased activity tolerance;Impaired balance (sitting and/or standing);Decreased coordination;Decreased safety awareness;Decreased knowledge of use of DME or AE;Impaired tone;Impaired UE functional use   Maurie Boettcher, OTR/L  [id number] 12-26-2016OT Treatment/Interventions: Self-care/ADL training;Therapeutic exercise;Neuromuscular education;DME and/or AE instruction;Therapeutic activities;Cognitive remediation/compensation;Patient/family education;Balance training    OT Goals(Current goals can be found in the care plan section) Acute Rehab OT Goals Patient Stated Goal: to get better OT Goal Formulation: With patient Time For Goal Achievement: 10/27/15 Potential to Achieve Goals: Good ADL Goals Pt Will Perform Eating: with set-up;with supervision;with adaptive utensils;sitting;with caregiver independent in assisting Pt Will Perform Grooming: with min assist;sitting;with adaptive equipment Pt  Will Perform Upper Body Bathing: with min assist;with adaptive equipment;sitting Pt Will Perform Lower Body Bathing: with min assist;sit to/from stand;with adaptive equipment Pt Will Transfer to Toilet: with min guard assist;bedside commode;stand pivot transfer Pt Will Perform Toileting - Clothing Manipulation and hygiene: with min assist;sitting/lateral leans;sit to/from stand Pt/caregiver will Perform Home Exercise Program: Left upper extremity;With theraputty;With Supervision;With written HEP provided (fine motor/strengthening)  OT Frequency: Min 3X/week   Barriers to D/C:            Co-evaluation              End of Session Equipment Utilized During Treatment: Gait belt Nurse Communication: Mobility status  Activity Tolerance: Patient tolerated treatment well Patient left: in chair;with call bell/phone within reach;with family/visitor present   Time: [id number] OT Time Calculation (min): 16 min Charges:  OT General Charges $OT Visit: 1 Procedure OT Evaluation $Initial OT Evaluation Tier I: 1 Procedure G-Codes:    Paizlie Klaus,HILLARY Oct 21, 2015, 1:28 PM

## 2015-10-13 NOTE — Progress Notes (Signed)
STROKE TEAM PROGRESS NOTE   HISTORY Darrell [name] is an 69 y.o. 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 At baseline patient has right HP, ambulates with a cane, and can perform the majority of his ADL without assistance.  Wife is at the bedside and tells me that yesterday afternoon her husband complained of having trouble walking. She said that they went to a restaurant and he had to be help out of the car, and while eating couldn't use the utensils properly with his left hand. She did not notice slurred speech, facial asymmetry, confusion, and he denies HA, vertigo, double vision, or visual impairment. Stated that he was advised to stop taking aspirin due to frequent skin bruising.  CT head and MRI brain were completed, personally reviewed, and there is evidence of an acute small infarct involving the right centrum semiovale.  Date last known well: 10/11/15 Time last known well: uncertain  tPA Given: no, late presentation   SUBJECTIVE (INTERVAL HISTORY) [name] wife is at the bedside. Overall he is feeling somewhat better. Dr Irish Elders discussed the MRI results.   OBJECTIVE Temp:  [98.2 F (36.8 C)-99.1 F (37.3 C)] 98.2 F (36.8 C) (12/18 0500) Pulse Rate:  [39-97] 75 (12/18 0000) Cardiac Rhythm:  [-] Normal sinus rhythm;Bundle branch block (12/17 1900) Resp:  [16-20] 20 (12/18 0659) BP: (126-170)/(63-104) 170/83 mmHg (12/18 0659) SpO2:  [90 %-99 %] 96 % (12/18 0659) Weight:  [93.441 kg (206 lb)] 93.441 kg (206 lb) (12/17 1044)  CBC:  Recent Labs Lab 10/12/15 1200 10/12/15 1221 10/13/15 0258  WBC 7.3  --  8.6  NEUTROABS 4.9  --   --   HGB 14.9 16.0 15.0  HCT 44.7 47.0 44.4  MCV 88.7  --  88.6  PLT 154  --  [id number]    Basic Metabolic Panel:  Recent  Labs Lab 10/12/15 1200 10/12/15 1221 10/13/15 0108 10/13/15 0258  NA 139 142  --  137  K 3.5 3.4*  --  3.2*  CL 107 104  --  105  CO2 25  --   --  24  GLUCOSE 149* 145*  --  127*  BUN 8 10  --  10  CREATININE 0.68 0.70  --  0.69  CALCIUM 8.5*  --   --  8.7*  MG  --   --  2.1  --     Lipid Panel:    Component Value Date/Time   CHOL 76 10/13/2015 0258   TRIG 96 10/13/2015 0258   TRIG 246* 10/05/2006 0904   HDL 17* 10/13/2015 0258   CHOLHDL 4.5 10/13/2015 0258   CHOLHDL 4.7 CALC 10/05/2006 0904   VLDL 19 10/13/2015 0258   LDLCALC 40 10/13/2015 0258   HgbA1c:  Lab Results  Component Value Date   HGBA1C 6.6* 08/13/2015   Urine Drug Screen:    Component Value Date/Time   LABOPIA NONE DETECTED 10/12/2015 1452   COCAINSCRNUR NONE DETECTED 10/12/2015 1452   LABBENZ NONE DETECTED 10/12/2015 1452   AMPHETMU NONE DETECTED 10/12/2015 1452   THCU NONE DETECTED 10/12/2015 1452   LABBARB NONE DETECTED 10/12/2015 1452      IMAGING  Dg Chest 2 View 10/12/2015   Cardiomegaly with pulmonary vascular redistribution. Probable healing posterior right rib fractures.  Ct Head Wo Contrast 10/12/2015   Stable appearance of the brain by head CT with old left-sided hemispheric infarct present as well as multiple old bilateral lacunar infarcts. No acute findings.     Mr Brain Wo Contrast (neuro Protocol) 10/12/2015   1. 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  2. Expected progression describes that expected evolution of large left MCA territory nonhemorrhagic infarct.  3. Progressive remote lacunar infarcts in the basal ganglia on the left.  4. Stable remote lacunar infarcts in the basal ganglia on the right.  5. Progressive periventricular white matter changes bilaterally.      Mr Jodene Nam Head/brain Wo Cm 10/12/2015  C 1. Mild medium and distal small vessel disease without significant proximal stenosis, aneurysm,  or branch vessel occlusion.    PHYSICAL EXAM  Mental Status: Alert, oriented, thought content appropriate. Speech fluent without evidence of aphasia. Able to follow 3 step commands. Cranial Nerves: II: Discs not visualized; Visual fields grossly normal, pupils equal, round, reactive to light and accommodation III,IV, VI: ptosis not present, extra-ocular motions intact bilaterally V,VII: smile symmetric, facial light touch sensation normal bilaterally VIII: hearing normal bilaterally IX,X: uvula rises symmetrically XI: bilateral shoulder shrug XII: midline tongue extension without atrophy or fasciculations  Motor: Motor strength 4/5 bilaterally upper and lower extremities. Tone and bulk:normal tone throughout; no atrophy noted Sensory: Light touch intact bilaterally Cerebellar: normal finger-to-nose Gait:  No tested    ASSESSMENT/PLAN [name] is a 69 y.o. 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  He did not receive IV t-PA due to late presentation.  Stroke:  Non-dominant infarct secondary to small vessel disease.  Resultant mild left hemiparesis.  MRI  as above  MRA  no significant stenosis  Carotid Doppler pending  2D Echo  EF 55-60%. No cardiac source of emboli identified.  LDL 40  HgbA1c pending  VTE prophylaxis - Lovenox  Diet heart healthy/carb modified Room service appropriate?: Yes; Fluid consistency:: Thin  No antithrombotic prior to admission, now on aspirin 325 mg daily  Patient counseled to be compliant with his antithrombotic medications  Ongoing aggressive stroke risk factor management  Therapy recommendations:  Inpatient rehabilitation recommended  Disposition: Pending  Hypertension  Stable  Permissive hypertension (OK if < 220/120) but gradually normalize in 5-7 days  Hyperlipidemia  Home  meds: No lipid lowering medications prior to admission  LDL40, goal < 70   Diabetes  HgbA1c pending, goal < 7.0  Controlled   Other Stroke Risk Factors  Advanced age  Cigarette smoker, quit smoking 13 years ago   Obesity, Body mass index is 29.56 kg/(m^2).   Hx stroke/TIA   Other Active Problems  Hypokalemia - supplemented  Hospital day # 1  Mikey Bussing [facility] Pager [phone] 10/13/2015, 4:03 PM     To contact Stroke Continuity provider, please refer to [email or web address] After hours, contact General Neurology

## 2015-10-13 NOTE — Progress Notes (Signed)
Notified by tele of 12 runs of SVT. Alert, oriented.  No c/o of SOB. Notified NP with order of lab stat. Will continue to monitor.

## 2015-10-13 NOTE — Evaluation (Signed)
Physical Therapy Evaluation Patient Details Name: Darrell [name] MRN: [id number] DOB: 1946-01-30 Today's Date: 10/13/2015   History of Present Illness  Pt is a 69 y/o male who presents s/p episode of L-sided weakness. He reports gait changes and inability to feed himself dinner - restaurant staff had to assist him to the hospital. PMH includes prior stroke with residual R sided weakness. 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  Clinical Impression  Pt admitted with above diagnosis. Pt currently with functional limitations due to the deficits listed below (see PT Problem List). At the time of PT eval pt was able to perform transfers with min assist and ambulation with mod to max assist. Would benefit from +2 for further gait training. Use of RW not feasible at this time. Pt would benefit from continued therapy at the CIR level at d/c.      Follow Up Recommendations CIR;Supervision/Assistance - 24 hour    Equipment Recommendations  Other (comment) (TBD)    Recommendations for Other Services Rehab consult     Precautions / Restrictions Precautions Precautions: Fall Restrictions Weight Bearing Restrictions: No      Mobility  Bed Mobility Overal bed mobility: Needs Assistance Bed Mobility: Rolling;Sidelying to Sit;Supine to Sit Rolling: Supervision Sidelying to sit: Min assist Supine to sit: Min assist     General bed mobility comments: Pt attempted to transition to EOB x2 different ways and required min assist with log roll and supine>sit to elevate trunk to full sitting position.   Transfers Overall transfer level: Needs assistance Equipment used: Rolling walker (2 wheeled) Transfers: Sit to/from Stand Sit to Stand: Min assist         General transfer comment: Therapist assist to position R hand on walker and maintain positioning. Pt wanted to try RW for support.   Ambulation/Gait Ambulation/Gait assistance: Mod assist;Max assist Ambulation Distance  (Feet): 100 Feet Assistive device: Rolling walker (2 wheeled);Straight cane Gait Pattern/deviations: Step-through pattern;Decreased stride length;Trunk flexed;Ataxic;Staggering left Gait velocity: Decreased Gait velocity interpretation: Below normal speed for age/gender General Gait Details: Therapist held R hand on walker and weighted the R side ot maintain 4 legs of walker on the ground. Mod to max assist at times to maintain balance and provide support during ambulation. SPC was provided to simulate home environment per pt request, and max assist was required to maintain balance. RW was again provided and therapist assisted pt back to the room with a weighted R side of walker.   Stairs            Wheelchair Mobility    Modified Rankin (Stroke Patients Only) Modified Rankin (Stroke Patients Only) Pre-Morbid Rankin Score: Moderately severe disability Modified Rankin: Severe disability     Balance Overall balance assessment: Needs assistance Sitting-balance support: Feet supported;No upper extremity supported Sitting balance-Leahy Scale: Fair     Standing balance support: No upper extremity supported;During functional activity Standing balance-Leahy Scale: Zero Standing balance comment: Dynamic balance                             Pertinent Vitals/Pain Pain Assessment: No/denies pain    Home Living Family/patient expects to be discharged to:: Private residence Living Arrangements: Spouse/significant other Available Help at Discharge: Family;Available 24 hours/day Type of Home: Other(Comment) (Townhome) Home Access: Stairs to enter Entrance Stairs-Rails: Right;Left;Can reach both Entrance Stairs-Number of Steps: 1 Home Layout: Two level;Able to live on main level with bedroom/bathroom Home Equipment: Shower seat;Grab  bars - tub/shower;Cane - single point;Toilet riser      Prior Function Level of Independence: Independent with assistive device(s)          Comments: Used cane occasionally but was independent with ADL's. (?loftstrand crutch)     Hand Dominance   Dominant Hand: Left    Extremity/Trunk Assessment   Upper Extremity Assessment: Defer to OT evaluation;RUE deficits/detail RUE Deficits / Details: residual effects from prior CVA. able to use RUE as functional assist         Lower Extremity Assessment: Generalized weakness;RLE deficits/detail;LLE deficits/detail RLE Deficits / Details: Residual weakness from prior stroke LLE Deficits / Details: Appears to have some weakness. Unsure how much is acute weakness vs. generalized weakness.   Cervical / Trunk Assessment: Other exceptions  Communication   Communication: No difficulties  Cognition Arousal/Alertness: Awake/alert Behavior During Therapy: WFL for tasks assessed/performed Overall Cognitive Status: History of cognitive impairments - at baseline       Memory: Decreased short-term memory              General Comments      Exercises        Assessment/Plan    PT Assessment Patient needs continued PT services  PT Diagnosis Difficulty walking;Hemiplegia non-dominant side;Hemiplegia dominant side   PT Problem List Decreased strength;Decreased range of motion;Decreased activity tolerance;Decreased balance;Decreased mobility;Decreased coordination;Decreased knowledge of use of DME;Decreased safety awareness;Decreased knowledge of precautions;Pain;Impaired sensation  PT Treatment Interventions DME instruction;Gait training;Stair training;Functional mobility training;Therapeutic activities;Therapeutic exercise;Neuromuscular re-education;Patient/family education   PT Goals (Current goals can be found in the Care Plan section) Acute Rehab PT Goals Patient Stated Goal: to get better PT Goal Formulation: With patient/family Time For Goal Achievement: 10/20/15 Potential to Achieve Goals: Good    Frequency Min 4X/week   Barriers to discharge         Co-evaluation               End of Session Equipment Utilized During Treatment: Gait belt Activity Tolerance: Patient tolerated treatment well Patient left: in chair;with call bell/phone within reach;with family/visitor present Nurse Communication: Mobility status         Time: 1000-1035 PT Time Calculation (min) (ACUTE ONLY): 35 min   Charges:   PT Evaluation $Initial PT Evaluation Tier I: 1 Procedure PT Treatments $Gait Training: 8-22 mins   PT G Codes:        Rolinda Roan 10/31/2015, 3:29 PM   Rolinda Roan, PT, DPT Acute Rehabilitation Services Pager: [phone]

## 2015-10-13 NOTE — Progress Notes (Signed)
Occupational Therapy Treatment Patient Details Name: Jakeith Demont MRN: [id number] DOB: Sep 21, 1946 Today's Date: 10/13/2015    History of present illness Pt is a 69 y/o male who presents s/p episode of L-sided weakness. He reports gait changes and inability to feed himself dinner - restaurant staff had to assist him to the hospital. PMH includes prior stroke with residual R sided weakness. 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   OT comments  Pt seen to work on self feeding with use of compensatory techniques and AE. Wife educated on AE and proper set up to increase pt's independence. Pt able to self feed with occasional min A with AE and proper set up. Very appreciative of help. Will continue to follow.   Follow Up Recommendations  CIR;Supervision/Assistance - 24 hour    Equipment Recommendations  None recommended by OT    Recommendations for Other Services Rehab consult    Precautions / Restrictions Precautions Precautions: Fall Restrictions Weight Bearing Restrictions: No       Mobility   Balance    ADL Overall ADL's : Needs assistance/impaired Eating/Feeding: Minimal assistance Eating/Feeding Details (indicate cue type and reason): worked with pt during lunch. Using plate guard, adapted built up spoone (curved) and "sippy cup" with lid. Educated pt on using R hand as funcitonal assist to bump cup into L hand. wife presnt for education and need to properly set pt up with AE and placement of foods. Recommending taking foods out of small narrom dessert containers and placing these on pt's plate in order to use adapted spoon and plateguard. Pt doing well with finger foods. Recommended to cut up sandwich into smaller bites to use as finger foods. Pt did well with this.                           Cognition   Behavior During Therapy: WFL for tasks assessed/performed Overall Cognitive Status: History of cognitive impairments - at baseline  Pt with  apparent cognitive deficits. Unsure if this is baseline. Slow processing. Will further assess.       Memory: Decreased short-term memory               Extremity/Trunk Assessment  Upper Extremity Assessment Upper Extremity Assessment: RUE deficits/detail;LUE deficits/detail RUE Deficits / Details: residual effects from prior CVA. able to use RUE as functional assist RUE Coordination: decreased fine motor;decreased gross motor LUE Deficits / Details: Weaker distally. Able to move out of synergy pattern. R hand gross grasp/release.Poor strength @ 3/5. Able to oppose thumb to ring and middle finger and index but poor strength. Unable to use functional pinch to pick up objects. Unable to pick up utensils or hold a cup.  No iin hand manipulation skills.  LUE Sensation: decreased light touch (appears impaired) LUE Coordination: decreased fine motor;decreased gross motor (por finger to nose; ataxic)   Lower Extremity Assessment Lower Extremity Assessment: Defer to PT evaluation   Cervical / Trunk Assessment Cervical / Trunk Assessment: Other exceptions Cervical / Trunk Exceptions: L bias    Exercises     Shoulder Instructions       General Comments      Pertinent Vitals/ Pain       Pain Assessment: No/denies pain  Home Living Family/patient expects to be discharged to:: Private residence Living Arrangements: Spouse/significant other Available Help at Discharge: Family;Available 24 hours/day Type of Home: Other(Comment) ([facility]) Home Access: Stairs to enter [employer] of Steps: 1  Entrance Stairs-Rails: Right;Left;Can reach both Home Layout: Two level;Able to live on main level with bedroom/bathroom     Bathroom Shower/Tub: Occupational psychologist: Handicapped height Bathroom Accessibility: Yes How Accessible: Accessible via walker (wife thinks so) Home Equipment: Shower seat;Grab bars - tub/shower;Cane - single point;Toilet riser           Prior Functioning/Environment Level of Independence: Independent with assistive device(s)        Comments: Used cane occasionally but was independent with ADL's. (?loftstrand crutch)   Frequency Min 3X/week     Progress Toward Goals  OT Goals(current goals can now be found in the care plan section)  Progress towards OT goals: Progressing toward goals  Acute Rehab OT Goals Patient Stated Goal: to get better OT Goal Formulation: With patient Time For Goal Achievement: 10/27/15 Potential to Achieve Goals: Good ADL Goals Pt Will Perform Eating: with set-up;with supervision;with adaptive utensils;sitting;with caregiver independent in assisting Pt Will Perform Grooming: with min assist;sitting;with adaptive equipment Pt Will Perform Upper Body Bathing: with min assist;with adaptive equipment;sitting Pt Will Perform Lower Body Bathing: with min assist;sit to/from stand;with adaptive equipment Pt Will Transfer to Toilet: with min guard assist;bedside commode;stand pivot transfer Pt Will Perform Toileting - Clothing Manipulation and hygiene: with min assist;sitting/lateral leans;sit to/from stand Pt/caregiver will Perform Home Exercise Program: Left upper extremity;With theraputty;With Supervision;With written HEP provided (fine motor/strengthening)  Plan Discharge plan remains appropriate    Co-evaluation                 End of Session Equipment Utilized During Treatment: Gait belt   Activity Tolerance Patient tolerated treatment well   Patient Left in chair;with call bell/phone within reach;with family/visitor present   Nurse Communication Other (comment) (AE for meals)        Time: [id number] OT Time Calculation (min): 20 min  Charges: OT General Charges $OT Visit: 1 Procedure  OT Treatments $Self Care/Home Management : 8-22 mins  Trinidad Ingle,HILLARY 10/13/2015, 1:37 PM  [facility], OTR/L  [phone] 10/13/2015

## 2015-10-13 NOTE — Progress Notes (Signed)
Utilization Review Completed.Darrell [name] T12/18/2016  

## 2015-10-14 ENCOUNTER — Ambulatory Visit (HOSPITAL_COMMUNITY): Payer: Medicare Other

## 2015-10-14 ENCOUNTER — Encounter (HOSPITAL_COMMUNITY): Payer: Self-pay | Admitting: Physical Medicine & Rehabilitation

## 2015-10-14 ENCOUNTER — Inpatient Hospital Stay (HOSPITAL_COMMUNITY)
Admission: RE | Admit: 2015-10-14 | Discharge: 2015-10-26 | DRG: 057 | Disposition: A | Discharge status: Home / Self Care | Payer: Medicare Other | Source: Intra-hospital | Attending: Physical Medicine & Rehabilitation | Admitting: Physical Medicine & Rehabilitation

## 2015-10-14 DIAGNOSIS — N401 Enlarged prostate with lower urinary tract symptoms: Secondary | ICD-10-CM | POA: Diagnosis present

## 2015-10-14 DIAGNOSIS — K5900 Constipation, unspecified: Secondary | ICD-10-CM | POA: Diagnosis present

## 2015-10-14 DIAGNOSIS — E785 Hyperlipidemia, unspecified: Secondary | ICD-10-CM | POA: Diagnosis present

## 2015-10-14 DIAGNOSIS — I6381 Other cerebral infarction due to occlusion or stenosis of small artery: Secondary | ICD-10-CM | POA: Diagnosis present

## 2015-10-14 DIAGNOSIS — Z87891 Personal history of nicotine dependence: Secondary | ICD-10-CM

## 2015-10-14 DIAGNOSIS — E1151 Type 2 diabetes mellitus with diabetic peripheral angiopathy without gangrene: Secondary | ICD-10-CM | POA: Diagnosis present

## 2015-10-14 DIAGNOSIS — Z79899 Other long term (current) drug therapy: Secondary | ICD-10-CM | POA: Diagnosis not present

## 2015-10-14 DIAGNOSIS — I639 Cerebral infarction, unspecified: Secondary | ICD-10-CM

## 2015-10-14 DIAGNOSIS — E8809 Other disorders of plasma-protein metabolism, not elsewhere classified: Secondary | ICD-10-CM | POA: Diagnosis present

## 2015-10-14 DIAGNOSIS — I69354 Hemiplegia and hemiparesis following cerebral infarction affecting left non-dominant side: Secondary | ICD-10-CM | POA: Diagnosis not present

## 2015-10-14 DIAGNOSIS — I10 Essential (primary) hypertension: Secondary | ICD-10-CM | POA: Diagnosis present

## 2015-10-14 DIAGNOSIS — I69359 Hemiplegia and hemiparesis following cerebral infarction affecting unspecified side: Secondary | ICD-10-CM | POA: Diagnosis not present

## 2015-10-14 DIAGNOSIS — Z7984 Long term (current) use of oral hypoglycemic drugs: Secondary | ICD-10-CM

## 2015-10-14 DIAGNOSIS — Z885 Allergy status to narcotic agent status: Secondary | ICD-10-CM

## 2015-10-14 DIAGNOSIS — E876 Hypokalemia: Secondary | ICD-10-CM

## 2015-10-14 DIAGNOSIS — G8111 Spastic hemiplegia affecting right dominant side: Secondary | ICD-10-CM | POA: Diagnosis present

## 2015-10-14 DIAGNOSIS — G4730 Sleep apnea, unspecified: Secondary | ICD-10-CM | POA: Diagnosis present

## 2015-10-14 DIAGNOSIS — R350 Frequency of micturition: Secondary | ICD-10-CM | POA: Diagnosis present

## 2015-10-14 DIAGNOSIS — F329 Major depressive disorder, single episode, unspecified: Secondary | ICD-10-CM | POA: Diagnosis present

## 2015-10-14 DIAGNOSIS — G4733 Obstructive sleep apnea (adult) (pediatric): Secondary | ICD-10-CM | POA: Diagnosis present

## 2015-10-14 DIAGNOSIS — R531 Weakness: Secondary | ICD-10-CM | POA: Diagnosis present

## 2015-10-14 DIAGNOSIS — G8191 Hemiplegia, unspecified affecting right dominant side: Secondary | ICD-10-CM

## 2015-10-14 DIAGNOSIS — E1142 Type 2 diabetes mellitus with diabetic polyneuropathy: Secondary | ICD-10-CM | POA: Diagnosis present

## 2015-10-14 DIAGNOSIS — I6930 Unspecified sequelae of cerebral infarction: Secondary | ICD-10-CM

## 2015-10-14 DIAGNOSIS — M6289 Other specified disorders of muscle: Secondary | ICD-10-CM

## 2015-10-14 DIAGNOSIS — E46 Unspecified protein-calorie malnutrition: Secondary | ICD-10-CM

## 2015-10-14 DIAGNOSIS — E119 Type 2 diabetes mellitus without complications: Secondary | ICD-10-CM | POA: Insufficient documentation

## 2015-10-14 DIAGNOSIS — E1165 Type 2 diabetes mellitus with hyperglycemia: Secondary | ICD-10-CM

## 2015-10-14 DIAGNOSIS — F32A Depression, unspecified: Secondary | ICD-10-CM | POA: Diagnosis present

## 2015-10-14 DIAGNOSIS — R351 Nocturia: Secondary | ICD-10-CM

## 2015-10-14 HISTORY — DX: Type 2 diabetes mellitus with diabetic polyneuropathy: E11.42

## 2015-10-14 HISTORY — DX: Unspecified sequelae of cerebral infarction: I69.30

## 2015-10-14 HISTORY — DX: Type 2 diabetes mellitus with diabetic polyneuropathy: E11.65

## 2015-10-14 HISTORY — DX: Type 2 diabetes mellitus with hyperglycemia: E11.65

## 2015-10-14 LAB — HEMOGLOBIN A1C
HEMOGLOBIN A1C: 6.9 % — AB (ref 4.8–5.6)
MEAN PLASMA GLUCOSE: 151 mg/dL

## 2015-10-14 LAB — BASIC METABOLIC PANEL
Anion gap: 8 (ref 5–15)
BUN: 10 mg/dL (ref 6–20)
CALCIUM: 8.5 mg/dL — AB (ref 8.9–10.3)
CHLORIDE: 103 mmol/L (ref 101–111)
CO2: 26 mmol/L (ref 22–32)
CREATININE: 0.69 mg/dL (ref 0.61–1.24)
GFR calc non Af Amer: >60 mL/min (ref >60)
Glucose, Bld: 143 mg/dL — ABNORMAL HIGH (ref 65–99)
Potassium: 3.7 mmol/L (ref 3.5–5.1)
SODIUM: 137 mmol/L (ref 135–145)

## 2015-10-14 LAB — GLUCOSE, CAPILLARY
GLUCOSE-CAPILLARY: 152 mg/dL — AB (ref 65–99)
Glucose-Capillary: 122 mg/dL — ABNORMAL HIGH (ref 65–99)
Glucose-Capillary: 107 mg/dL — ABNORMAL HIGH (ref 65–99)
Glucose-Capillary: 181 mg/dL — ABNORMAL HIGH (ref 65–99)

## 2015-10-14 LAB — CBC
HEMATOCRIT: 44.7 % (ref 39.0–52.0)
HEMOGLOBIN: 15.2 g/dL (ref 13.0–17.0)
MCH: 30 pg (ref 26.0–34.0)
MCHC: 34 g/dL (ref 30.0–36.0)
MCV: 88.3 fL (ref 78.0–100.0)
Platelets: 174 10*3/uL (ref 150–400)
RBC: 5.06 MIL/uL (ref 4.22–5.81)
RDW: 13.4 % (ref 11.5–15.5)
WBC: 8.1 10*3/uL (ref 4.0–10.5)

## 2015-10-14 LAB — CREATININE, SERUM: CREATININE: 0.73 mg/dL (ref 0.61–1.24)

## 2015-10-14 MED ORDER — ACETAMINOPHEN 325 MG PO TABS: 325.0000 mg | ORAL_TABLET | ORAL | Status: DC | PRN

## 2015-10-14 MED ORDER — ONDANSETRON HCL 4 MG/2ML IJ SOLN: 4.0000 mg | Freq: Four times a day (QID) | INTRAMUSCULAR | Status: DC | PRN

## 2015-10-14 MED ORDER — INSULIN ASPART 100 UNIT/ML ~~LOC~~ SOLN
0.0000 [IU] | Freq: Three times a day (TID) | SUBCUTANEOUS | Status: DC
  Administered 2015-10-15 – 2015-10-18 (×4): 1 [IU] via SUBCUTANEOUS
  Administered 2015-10-20: 2 [IU] via SUBCUTANEOUS
  Administered 2015-10-21 – 2015-10-25 (×4): 1 [IU] via SUBCUTANEOUS

## 2015-10-14 MED ORDER — SENNOSIDES-DOCUSATE SODIUM 8.6-50 MG PO TABS: 1.0000 | ORAL_TABLET | Freq: Every evening | ORAL | Status: DC | PRN

## 2015-10-14 MED ORDER — ASPIRIN 325 MG PO TABS
325.0000 mg | ORAL_TABLET | Freq: Every day | ORAL | Status: DC
  Administered 2015-10-15 – 2015-10-26 (×12): 325 mg via ORAL
  Filled 2015-10-14 (×12): qty 1

## 2015-10-14 MED ORDER — ENOXAPARIN SODIUM 40 MG/0.4ML ~~LOC~~ SOLN: 40.0000 mg | SUBCUTANEOUS | Status: DC

## 2015-10-14 MED ORDER — ENOXAPARIN SODIUM 40 MG/0.4ML ~~LOC~~ SOLN
40.0000 mg | SUBCUTANEOUS | Status: DC
  Administered 2015-10-14 – 2015-10-25 (×12): 40 mg via SUBCUTANEOUS
  Filled 2015-10-14 (×13): qty 0.4

## 2015-10-14 MED ORDER — ADULT MULTIVITAMIN W/MINERALS CH
1.0000 | ORAL_TABLET | Freq: Every day | ORAL | Status: DC
  Administered 2015-10-15 – 2015-10-26 (×12): 1 via ORAL
  Filled 2015-10-14 (×12): qty 1

## 2015-10-14 MED ORDER — METFORMIN HCL 500 MG PO TABS
1000.0000 mg | ORAL_TABLET | Freq: Two times a day (BID) | ORAL | Status: DC
  Administered 2015-10-14 – 2015-10-26 (×22): 1000 mg via ORAL
  Filled 2015-10-14 (×23): qty 2

## 2015-10-14 MED ORDER — ASPIRIN EC 325 MG PO TBEC: 325.0000 mg | DELAYED_RELEASE_TABLET | Freq: Every day | ORAL | Status: AC

## 2015-10-14 MED ORDER — CITALOPRAM HYDROBROMIDE 10 MG PO TABS
10.0000 mg | ORAL_TABLET | Freq: Every day | ORAL | Status: DC
  Administered 2015-10-15 – 2015-10-26 (×12): 10 mg via ORAL
  Filled 2015-10-14 (×12): qty 1

## 2015-10-14 MED ORDER — ASPIRIN 300 MG RE SUPP
300.0000 mg | Freq: Every day | RECTAL | Status: DC
Start: 2015-10-15 — End: 2015-10-26

## 2015-10-14 MED ORDER — LATANOPROST 0.005 % OP SOLN
1.0000 [drp] | Freq: Every day | OPHTHALMIC | Status: DC
  Administered 2015-10-14 – 2015-10-25 (×12): 1 [drp] via OPHTHALMIC
  Filled 2015-10-14 (×3): qty 2.5

## 2015-10-14 MED ORDER — ONDANSETRON HCL 4 MG PO TABS: 4.0000 mg | ORAL_TABLET | Freq: Four times a day (QID) | ORAL | Status: DC | PRN

## 2015-10-14 MED ORDER — SORBITOL 70 % SOLN: 30.0000 mL | Freq: Every day | Status: DC | PRN

## 2015-10-14 NOTE — Progress Notes (Signed)
Patient is discharged from room 5M01 at this time. Transferred to unit 4M03. Report given to receiving nurse Dolores Lory, RN. IV site as well as tele d/c'd. Transported via bed with wife and all belongings at side.

## 2015-10-14 NOTE — Progress Notes (Signed)
SLP Cancellation Note  Patient Details Name: Darrell [name] MRN: [id number] DOB: 11/09/1945   Cancelled treatment:       Reason Eval/Treat Not Completed: SLP screened, patient reports being at his baseline,no needs identified, will sign off.  Gunnar Fusi, M.A., CCC-SLP [phone]  [place] 10/14/2015, 2:35 PM

## 2015-10-14 NOTE — Progress Notes (Signed)
Rehab admissions - Evaluated for possible admission.  I met with patient.  He lives with his wife.  He would like inpatient rehab.  Dr. Leonie Man and PA, Waunita Schooner came into room.  Dr. Leonie Man says patient cleared to go to inpatient rehab today.  I called attending MD, Dr. Algis Liming, who also says patient okay for rehab today.  Bed available and will admit to inpatient rehab today.  Call me for questions.  #[phone]

## 2015-10-14 NOTE — Discharge Instructions (Signed)
Stroke Prevention Some medical conditions and behaviors are associated with an increased chance of having a stroke. You may prevent a stroke by making healthy choices and managing medical conditions. HOW CAN I REDUCE MY RISK OF HAVING A STROKE?   Stay physically active. Get at least 30 minutes of activity on most or all days.  Do not smoke. It may also be helpful to avoid exposure to secondhand smoke.  Limit alcohol use. Moderate alcohol use is considered to be:  No more than 2 drinks per day for men.  No more than 1 drink per day for nonpregnant women.  Eat healthy foods. This involves:  Eating 5 or more servings of fruits and vegetables a day.  Making dietary changes that address high blood pressure (hypertension), high cholesterol, diabetes, or obesity.  Manage your cholesterol levels.  Making food choices that are high in fiber and low in saturated fat, trans fat, and cholesterol may control cholesterol levels.  Take any prescribed medicines to control cholesterol as directed by your health care provider.  Manage your diabetes.  Controlling your carbohydrate and sugar intake is recommended to manage diabetes.  Take any prescribed medicines to control diabetes as directed by your health care provider.  Control your hypertension.  Making food choices that are low in salt (sodium), saturated fat, trans fat, and cholesterol is recommended to manage hypertension.  Ask your health care provider if you need treatment to lower your blood pressure. Take any prescribed medicines to control hypertension as directed by your health care provider.  If you are 18-39 years of age, have your blood pressure checked every 3-5 years. If you are 40 years of age or older, have your blood pressure checked every year.  Maintain a healthy weight.  Reducing calorie intake and making food choices that are low in sodium, saturated fat, trans fat, and cholesterol are recommended to manage  weight.  Stop drug abuse.  Avoid taking birth control pills.  Talk to your health care provider about the risks of taking birth control pills if you are over 35 years old, smoke, get migraines, or have ever had a blood clot.  Get evaluated for sleep disorders (sleep apnea).  Talk to your health care provider about getting a sleep evaluation if you snore a lot or have excessive sleepiness.  Take medicines only as directed by your health care provider.  For some people, aspirin or blood thinners (anticoagulants) are helpful in reducing the risk of forming abnormal blood clots that can lead to stroke. If you have the irregular heart rhythm of atrial fibrillation, you should be on a blood thinner unless there is a good reason you cannot take them.  Understand all your medicine instructions.  Make sure that other conditions (such as anemia or atherosclerosis) are addressed. SEEK IMMEDIATE MEDICAL CARE IF:   You have sudden weakness or numbness of the face, arm, or leg, especially on one side of the body.  Your face or eyelid droops to one side.  You have sudden confusion.  You have trouble speaking (aphasia) or understanding.  You have sudden trouble seeing in one or both eyes.  You have sudden trouble walking.  You have dizziness.  You have a loss of balance or coordination.  You have a sudden, severe headache with no known cause.  You have new chest pain or an irregular heartbeat. Any of these symptoms may represent a serious problem that is an emergency. Do not wait to see if the symptoms will   go away. Get medical help at once. Call your local emergency services (911 in [place] Do not drive yourself to the hospital.   This information is not intended to replace advice given to you by your health care provider. Make sure you discuss any questions you have with your health care provider.   Document Released: 11/19/2004 Document Revised: 11/02/2014 Document Reviewed:  04/14/2013 [employer] Interactive Patient Education 2016 [employer]  

## 2015-10-14 NOTE — PMR Pre-admission (Signed)
PMR Admission Coordinator Pre-Admission Assessment  Patient: Darrell [name] is an 69 y.o., male MRN: [id number] DOB: 07/20/1946 Height: 5\' 10"  (177.8 cm) Weight: 93.441 kg (206 lb)              Insurance Information HMO: No    PPO:       PCP:       IPA:       80/20:       OTHER:   PRIMARY: Medicare A/B      Policy#: [id number]      Subscriber: Darrell [name] CM Name:        Phone#:       Fax#:   Pre-Cert#:        Employer: Self employed Benefits:  Phone #:       Name: Checked in [place]. Date: 01/24/10     Deduct: $1288      Out of Pocket Max: none      Life Max: unlimited CIR: 100%      SNF: 100 days Outpatient: 805     Co-Pay: 20% Home Health: 100%      Co-Pay: none DME: 80%     Co-Pay: 20% Providers: patient's choice  SECONDARY: [employer] indemnity      Policy#: [id number]      Subscriber: Darrell [name] CM Name:        Phone#:       Fax#:   Pre-Cert#:        Employer: Self employed Benefits:  Phone #: [phone]     Name:   Eff. 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 Patient lives with spouse two level townhome 2 step to entry bedroom on first floor. Patient used a cane prior to admission. Patient required help with ADLs at baseline. Wife can assist as needed at discharge. Presented 10/12/2015  with left-sided weakness and slurred speech. MRI of the brain showed acute nonhemorrhagic white matter infarct in the right centrum semi-as well as progressive remote lacunar infarcts in the basal ganglia on the left stable remote lacunar infarcts basal ganglia on the right. Patient did not receive TPA. MRA of the head mild medium and distal small vessel disease without significant stenosis or occlusion. Echocardiogram with ejection fraction of [id number] grade 1 diastolic dysfunction. Carotid Dopplers with no evidence of ICA stenosis. Neurology follow-up maintain on aspirin for CVA prophylaxis subcutaneous Lovenox for DVT prophylaxis. Tolerating a regular consistency diet. Physical therapy evaluation completed 10/13/2015 with recommendations of physical medicine rehabilitation consult. Patient to be admitted for a comprehensive inpatient rehabilitation program.    Total: 1=NIH  Past Medical History  Past Medical History  Diagnosis Date  . Diabetes mellitus   . Hypertension     dr Sherren Mocha  . Sleep apnea     cpap      sleep study > 4 yrs; no longer wearing CPAP  . Stroke [facility])     rt side weakness, 2013    Family History  family history includes Colon cancer in his father; Heart disease in his father. There is no history of Esophageal cancer, Rectal cancer, or Stomach cancer.  Prior Rehab/Hospitalizations: Had outpatient therapy 3-4 yrs ago after CVA  Has the patient had major surgery during 100 days prior to admission? No  Current Medications   Current facility-administered medications:  .  aspirin suppository 300 mg, 300 mg, Rectal, Daily **OR** aspirin tablet 325 mg, 325 mg, Oral, Daily, Willia Craze, NP, 325 mg at 10/14/15 [id number] .  citalopram (CELEXA) tablet 10 mg, 10 mg, Oral, Daily, Willia Craze, NP, 10 mg at 10/14/15 [id number] .  enoxaparin (LOVENOX) injection 40 mg, 40 mg, Subcutaneous, Q24H, Willia Craze, NP, 40 mg at [id number] .  folic acid (FOLVITE) tablet 1 mg, 1 mg, Oral,  Daily, Ripudeep K Rai, MD, 1 mg at 10/14/15 [id number] .  hydrALAZINE (APRESOLINE) injection 10 mg, 10 mg, Intravenous, Q6H PRN, Melton Alar, PA-C, 10 mg at 10/12/15 1733 .  insulin aspart (novoLOG) injection 0-9 Units, 0-9 Units, Subcutaneous, TID WC, Willia Craze, NP, 2 Units at 10/14/15 1231 .  latanoprost (XALATAN) 0.005 % ophthalmic solution 1 drop, 1 drop, Both Eyes, QHS, Modena Jansky, MD, 1 drop at 10/13/15 2010 .  LORazepam (ATIVAN) injection 1 mg, 1 mg, Intravenous, Q30 min PRN, Tanna Furry, MD, 1 mg at 10/12/15 1556 .  LORazepam (ATIVAN) tablet 1 mg, 1 mg, Oral, Q6H PRN **OR** LORazepam (ATIVAN) injection 1 mg, 1 mg, Intravenous, Q6H PRN, Ripudeep K Rai, MD .  multivitamin with minerals tablet 1 tablet, 1 tablet, Oral, Daily, Ripudeep Krystal Eaton, MD, 1 tablet at 10/14/15 0921 .  senna-docusate (Senokot-S) tablet 1 tablet, 1 tablet, Oral, QHS PRN, Willia Craze, NP .  thiamine (VITAMIN B-1) tablet 100 mg, 100 mg, Oral, Daily, 100 mg at 10/14/15 [id number] **OR** [DISCONTINUED] thiamine (B-1) injection 100 mg, 100 mg, Intravenous, Daily, Ripudeep K Rai, MD  Patients Current Diet: Diet heart healthy/carb modified Room service appropriate?: Yes; Fluid consistency:: Thin  Precautions / Restrictions Precautions Precautions: Fall Restrictions Weight Bearing Restrictions: No   Has the patient had 2 or more falls or a fall with injury in the past year?No  Prior Activity Level Community (5-7x/wk): Went out daily.  Walked 1+ miles each day.  Patient no longer drives.  His wife drives.  Home Assistive Devices / Equipment Home Assistive Devices/Equipment: Eyeglasses Home Equipment: Shower seat, Grab bars - tub/shower, Cane - single point, Toilet riser  Prior Device Use: Indicate devices/aids used by the patient prior to current illness, exacerbation or injury? Straight cane, used when out of his house.  Used no device when at home.  Prior Functional Level Prior Function Level of  Independence: Independent with assistive device(s) Comments: Used cane occasionally but was independent with ADL's. (?loftstrand crutch)  Self Care: Did the patient need help bathing, dressing, using the toilet or eating?  Independent  Indoor Mobility: Did the patient need assistance with walking from room to room (with or without device)? Independent  Stairs: Did the patient need assistance with internal or external stairs (with or without device)? Independent  Functional  Cognition: Did the patient need help planning regular tasks such as shopping or remembering to take medications? Independent  Current Functional Level Cognition  Overall Cognitive Status: History of cognitive impairments - at baseline Orientation Level: Oriented X4    Extremity Assessment (includes Sensation/Coordination)  Upper Extremity Assessment: Defer to OT evaluation, RUE deficits/detail RUE Deficits / Details: residual effects from prior CVA. able to use RUE as functional assist RUE Coordination: decreased fine motor, decreased gross motor LUE Deficits / Details: Weaker distally. Able to move out of synergy pattern. R hand gross grasp/release.Poor strength @ 3/5. Able to oppose thumb to ring and middle finger and index but poor strength. Unable to use functional pinch to pick up objects. Unable to pick up utensils or hold a cup.  No iin hand manipulation skills.  LUE Sensation: decreased light touch (appears impaired) LUE Coordination: decreased fine motor, decreased gross motor (por finger to nose; ataxic)  Lower Extremity Assessment: Generalized weakness, RLE deficits/detail, LLE deficits/detail RLE Deficits / Details: Residual weakness from prior stroke LLE Deficits / Details: Appears to have some weakness. Unsure how much is acute weakness vs. generalized weakness.     ADLs  Overall ADL's : Needs assistance/impaired Eating/Feeding: Minimal assistance Eating/Feeding Details (indicate cue type and reason):  worked with pt during lunch. Using plate guard, adapted built up spoone (curved) and "sippy cup" with lid. Educated pt on using R hand as funcitonal assist to bujmpcup into L hand. wife presnt for education and need to properly set pt up with AE and placement of foods. Recommending taking foods out of small narromw dessert containers and placing these on pt's plate. Pt doing well with finger foods. REccomended to cut up sandwich into smaller bites to use as finger foods. Pt did well with this.  Grooming: Maximal assistance Upper Body Bathing: Maximal assistance Lower Body Bathing: Maximal assistance Upper Body Dressing : Maximal assistance Lower Body Dressing: Maximal assistance Toilet Transfer: Moderate assistance, Ambulation Toileting- Clothing Manipulation and Hygiene: Maximal assistance Functional mobility during ADLs: Moderate assistance, Cueing for safety, Cueing for sequencing (L bias)    Mobility  Overal bed mobility: Needs Assistance Bed Mobility: Supine to Sit Rolling: Supervision Sidelying to sit: Min assist Supine to sit: Min assist General bed mobility comments: Pt was instructed in log roll for ease of achieving EOB, however pt still scooted his LE's off EOB and sat straight up.     Transfers  Overall transfer level: Needs assistance Equipment used: Rolling walker (2 wheeled) Transfers: Sit to/from Stand Sit to Stand: Min assist, From elevated surface Stand pivot transfers: Mod assist General transfer comment: Pt able to power-up to full stand with assist for balance/support. Pt had +2 for safety available.     Ambulation / Gait / Stairs / Wheelchair Mobility  Ambulation/Gait Ambulation/Gait assistance: Mod assist, +2 physical assistance, Min assist, +2 safety/equipment Ambulation Distance (Feet): 225 Feet Assistive device: Bilateral platform walker, 2 person hand held assist Gait Pattern/deviations: Step-through pattern, Decreased stride length, Shuffle General Gait  Details: Initially pt ambulated 5 feet with +2 assist to assess level of balance assist required. Pt was given B platform walker and was able to demonstrate a smoother gait pattern. Continues to shuffle feet along but is able to improve floor clearance for a short time with cueing. Chair follow utilized but not required. At end of gait training as pt fatigued, increased L lateral lean was noted and assist was required to keep R legs of walker on the ground.  Gait velocity:  Decreased Gait velocity interpretation: Below normal speed for age/gender    Posture / Balance Balance Overall balance assessment: Needs assistance Sitting-balance support: Feet supported, No upper extremity supported Sitting balance-Leahy Scale: Fair Standing balance support: Bilateral upper extremity supported, During functional activity Standing balance-Leahy Scale: Poor Standing balance comment: Dynamic balance    Special needs/care consideration BiPAP/CPAP Used CPAP up until 2 yrs ago.  Lost weight and no longer uses CPAP CPM fNo Continuous Drip IV No Dialysis No      Life Vest No Oxygen No Special Bed No Trach Size No Wound Vac (area) No       Skin No                              Bowel mgmt: Last BM documented as 10/13/15, but patient says no BM since admission  Bladder mgmt: Voiding Diabetic mgmt Yes, on oral medications at home and insulin since in the hospital.    Previous Home Environment Living Arrangements: Spouse/significant other Available Help at Discharge: Family, Available 24 hours/day Type of Home: Other(Comment) (Townhome) Home Layout: Two level, Able to live on main level with bedroom/bathroom Home Access: Stairs to enter Entrance Stairs-Rails: Right, Left, Can reach both Entrance Stairs-Number of Steps: 1 Bathroom Shower/Tub: Multimedia programmer: Handicapped height Bathroom Accessibility: Yes How Accessible: Accessible via walker (wife thinks so) [place]:  No  Discharge Living Setting Plans for Discharge Living Setting: Patient's home, Lives with (comment), Other (Comment) (Lives with wife in a condo.) Type of Home at Discharge: Other (Comment) (Condominium) Discharge Home Layout: Two level, Able to live on main level with bedroom/bathroom Alternate Level Stairs-Number of Steps: Flight Discharge Home Access: Stairs to enter [employer] of Steps: 2 steps. Does the patient have any problems obtaining your medications?: No  Social/Family/Support Systems Patient Roles: Spouse (Has a wife.) Contact Information: Annice Pih - wife Anticipated Caregiver: wife Anticipated Caregiver's Contact Information: [name] - wife [phone] Ability/Limitations of Caregiver: Wife is retired and can assist. Careers adviser: 24/7 Discharge Plan Discussed with Primary Caregiver: Yes Is Caregiver In Agreement with Plan?: Yes Does Caregiver/Family have Issues with Lodging/Transportation while Pt is in Rehab?: No  Goals/Additional Needs Patient/Family Goal for Rehab: PT min assist, OT supervision and min assist goals Expected length of stay: 16-19 days Cultural Considerations: [place] Dietary Needs: Heart healthy, carb mod, thin liquids Equipment Needs: TBD Pt/Family Agrees to Admission and willing to participate: Yes Program Orientation Provided & Reviewed with Pt/Caregiver Including Roles  & Responsibilities: Yes  Decrease burden of Care through IP rehab admission: N/A  Possible need for SNF placement upon discharge: Not planned  Patient Condition: This patient's condition remains as documented in the consult dated 10/14/15, in which the Rehabilitation Physician determined and documented that the patient's condition is appropriate for intensive rehabilitative care in an inpatient rehabilitation facility. Will admit to inpatient rehab today.  Preadmission Screen Completed By:  Retta Diones, 10/14/2015 1:28  PM ______________________________________________________________________   Discussed status with Dr. Posey Pronto on 10/14/15 at 1328 and received telephone approval for admission today.  Admission Coordinator:  Retta Diones, time1328/Date12/19/16

## 2015-10-14 NOTE — Progress Notes (Signed)
STROKE TEAM PROGRESS NOTE   HISTORY Darrell [name] is an 69 y.o. 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 At baseline patient has right HP, ambulates with a cane, and can perform the majority of his ADL without assistance.  Wife is at the bedside and tells me that yesterday afternoon her husband complained of having trouble walking. She said that they went to a restaurant and he had to be help out of the car, and while eating couldn't use the utensils properly with his left hand. She did not notice slurred speech, facial asymmetry, confusion, and he denies HA, vertigo, double vision, or visual impairment. Stated that he was advised to stop taking aspirin due to frequent skin bruising.  CT head and MRI brain were completed, personally reviewed, and there is evidence of an acute small infarct involving the right centrum semiovale.  Date last known well: 10/11/15 Time last known well: uncertain  tPA Given: no, late presentation   SUBJECTIVE (INTERVAL HISTORY) [name] family is not at the bedside. Overall he is feeling somewhat better.    OBJECTIVE Temp:  [97.8 F (36.6 C)-98.5 F (36.9 C)] 98.4 F (36.9 C) (12/19 1328) Pulse Rate:  [70-76] 76 (12/19 1328) Cardiac Rhythm:  [-] Normal sinus rhythm (12/19 0756) Resp:  [18-20] 18 (12/19 1328) BP: (144-167)/(84-95) 144/84 mmHg (12/19 1328) SpO2:  [90 %-94 %] 92 % (12/19 1328)  CBC:   Recent Labs Lab 10/12/15 1200 10/12/15 1221 10/13/15 0258  WBC 7.3  --  8.6  NEUTROABS 4.9  --   --   HGB 14.9 16.0 15.0  HCT 44.7 47.0 44.4  MCV 88.7  --  88.6  PLT 154  --  [id number]    Basic Metabolic Panel:   Recent Labs Lab 10/13/15 0108 10/13/15 0258 10/14/15 0655  NA  --  137 137  K  --  3.2* 3.7  CL  --  105 103  CO2  --   24 26  GLUCOSE  --  127* 143*  BUN  --  10 10  CREATININE  --  0.69 0.69  CALCIUM  --  8.7* 8.5*  MG 2.1  --   --     Lipid Panel:     Component Value Date/Time   CHOL 76 10/13/2015 0258   TRIG 96 10/13/2015 0258   TRIG 246* 10/05/2006 0904   HDL 17* 10/13/2015 0258   CHOLHDL 4.5 10/13/2015 0258   CHOLHDL 4.7 CALC 10/05/2006 0904   VLDL 19 10/13/2015 0258   LDLCALC 40 10/13/2015 0258   HgbA1c:  Lab Results  Component Value Date   HGBA1C 6.9* 10/13/2015   Urine Drug Screen:     Component Value Date/Time   LABOPIA NONE DETECTED 10/12/2015 1452   COCAINSCRNUR NONE DETECTED 10/12/2015 1452   LABBENZ NONE DETECTED 10/12/2015 1452   AMPHETMU NONE DETECTED 10/12/2015 1452   THCU NONE DETECTED 10/12/2015 1452   LABBARB NONE DETECTED 10/12/2015 1452      IMAGING  Dg Chest 2 View 10/12/2015   Cardiomegaly with pulmonary vascular redistribution. Probable healing posterior right rib fractures.     Ct Head Wo Contrast 10/12/2015   Stable appearance of the brain by head CT with old left-sided hemispheric infarct present as well as multiple old bilateral lacunar infarcts. No  acute findings.     Mr Brain Wo Contrast (neuro Protocol) 10/12/2015   1. 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  2. Expected progression describes that expected evolution of large left MCA territory nonhemorrhagic infarct.  3. Progressive remote lacunar infarcts in the basal ganglia on the left.  4. Stable remote lacunar infarcts in the basal ganglia on the right.  5. Progressive periventricular white matter changes bilaterally.      Mr Jodene Nam Head/brain Wo Cm 10/12/2015  C 1. Mild medium and distal small vessel disease without significant proximal stenosis, aneurysm, or branch vessel occlusion.    PHYSICAL EXAM  Mental Status: Alert, oriented, thought content appropriate. Speech fluent without evidence of aphasia. Able to follow  3 step commands. Cranial Nerves: II: Discs not visualized; Visual fields grossly normal, pupils equal, round, reactive to light and accommodation III,IV, VI: ptosis not present, extra-ocular motions intact bilaterally V,VII: smile symmetric, facial light touch sensation normal bilaterally VIII: hearing normal bilaterally IX,X: uvula rises symmetrically XI: bilateral shoulder shrug XII: midline tongue extension without atrophy or fasciculations  Motor: Motor strength 4/5 bilaterally upper and lower extremities. Tone and bulk:normal tone throughout; no atrophy noted Sensory: Light touch intact bilaterally Cerebellar: normal finger-to-nose Gait:  No tested    ASSESSMENT/PLAN [name] is a 69 y.o. 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  He did not receive IV t-PA due to late presentation.  Stroke:  Non-dominant infarct secondary to small vessel disease.  Resultant mild left hemiparesis.  MRI  as above  MRA  no significant stenosis  Carotid Doppler pending  2D Echo  EF 55-60%. No cardiac source of emboli identified.  LDL 40  HgbA1c 6.9  VTE prophylaxis - Lovenox Diet heart healthy/carb modified Room service appropriate?: Yes; Fluid consistency:: Thin Diet - low sodium heart healthy Diet Carb Modified  No antithrombotic prior to admission, now on aspirin 325 mg daily  Patient counseled to be compliant with his antithrombotic medications  Ongoing aggressive stroke risk factor management  Therapy recommendations:  Inpatient rehabilitation recommended  Disposition: Pending  Hypertension  Stable  Permissive hypertension (OK if < 220/120) but gradually normalize in 5-7 days  Hyperlipidemia  Home meds: No lipid lowering medications prior to admission  LDL40, goal < 70   Diabetes  HgbA1c pending, goal <  7.0  Controlled   Other Stroke Risk Factors  Advanced age  Cigarette smoker, quit smoking 13 years ago   Obesity, Body mass index is 29.56 kg/(m^2).   Hx stroke/TIA   Other Active Problems  Hypokalemia - supplemented  Hospital day # 2    I have personally examined this patient, reviewed notes, independently viewed imaging studies, participated in medical decision making and plan of care. I have made any additions or clarifications directly to the above note. Agree with transfer to rehabilitation. Stroke team will sign off. Follow-up as an outpatient in stroke clinic in 2 months.  Antony Contras, MD Medical Director [facility] Pager: [phone] 10/14/2015 4:05 PM  10/14/2015, 1:02 PM     To contact Stroke Continuity provider, please refer to [email or web address] After hours, contact General Neurology

## 2015-10-14 NOTE — Progress Notes (Signed)
Retta Diones, RN Rehab Admission Coordinator Signed Physical Medicine and Rehabilitation PMR Pre-admission 10/14/2015 1:16 PM  Related encounter: ED to Hosp-Admission (Discharged) from 10/12/2015 in [place] Collapse All   PMR Admission Coordinator Pre-Admission Assessment  Patient: Darrell [name] is an 69 y.o., male MRN: [id number] DOB: 1946/07/30 Height: 5\' 10"  (177.8 cm) Weight: 93.441 kg (206 lb)  Insurance Information HMO: No PPO: PCP: IPA: 80/20: OTHER:  PRIMARY: Medicare A/B Policy#: [id number] Subscriber: [name] CM Name: Phone#: Fax#:  Pre-Cert#: Employer: Self employed Benefits: Phone #: Name: Checked in [place]. Date: 01/24/10 Deduct: $1288 Out of Pocket Max: none Life Max: unlimited CIR: 100% SNF: 100 days Outpatient: 805 Co-Pay: 20% Home Health: 100% Co-Pay: none DME: 80% Co-Pay: 20% Providers: patient's choice  SECONDARY: [employer] indemnity Policy#: [id number] Subscriber: [name] CM Name: Phone#: Fax#:  Pre-Cert#: Employer: Self employed Benefits: Phone #: [phone] Name:  Eff. 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 Patient lives with spouse two level townhome 2 step to entry bedroom on first floor. Patient used a cane prior to admission. Patient required help with ADLs at baseline. Wife can assist as needed at discharge. Presented 10/12/2015 with left-sided weakness and slurred speech. MRI of the brain showed acute nonhemorrhagic white matter infarct in the right centrum semi-as well as progressive remote lacunar infarcts in the basal ganglia on the left stable remote lacunar infarcts basal ganglia on the right. Patient did not receive TPA. MRA of the head mild medium and distal small vessel disease without significant stenosis or occlusion. Echocardiogram with ejection fraction of [id number] grade 1 diastolic dysfunction. Carotid Dopplers with no evidence of ICA stenosis. Neurology follow-up maintain on aspirin for CVA prophylaxis subcutaneous Lovenox for DVT prophylaxis. Tolerating a regular consistency diet. Physical therapy evaluation completed 10/13/2015 with recommendations of physical medicine rehabilitation consult. Patient to be admitted for a comprehensive inpatient rehabilitation program.   Total: 1=NIH  Past Medical History  Past Medical History  Diagnosis Date  . Diabetes mellitus   . Hypertension     dr Sherren Mocha  . Sleep apnea     cpap sleep study > 4 yrs; no longer wearing CPAP  . Stroke [facility])     rt side weakness, 2013    Family History  family history includes Colon cancer in his father; Heart disease in his father. There is no history of Esophageal cancer, Rectal cancer, or Stomach cancer.  Prior Rehab/Hospitalizations: Had outpatient therapy 3-4 yrs ago after CVA  Has the patient had major surgery during 100 days prior to admission? No  Current  Medications   Current facility-administered medications:  . aspirin suppository 300 mg, 300 mg, Rectal, Daily **OR** aspirin tablet 325 mg, 325 mg, Oral, Daily, Willia Craze, NP, 325 mg at 10/14/15 [id number] . citalopram (CELEXA) tablet 10 mg, 10 mg, Oral, Daily, Willia Craze,  NP, 10 mg at 10/14/15 0922 . enoxaparin (LOVENOX) injection 40 mg, 40 mg, Subcutaneous, Q24H, Willia Craze, NP, 40 mg at [id number] . folic acid (FOLVITE) tablet 1 mg, 1 mg, Oral, Daily, Ripudeep K Rai, MD, 1 mg at 10/14/15 [id number] . hydrALAZINE (APRESOLINE) injection 10 mg, 10 mg, Intravenous, Q6H PRN, Melton Alar, PA-C, 10 mg at 10/12/15 1733 . insulin aspart (novoLOG) injection 0-9 Units, 0-9 Units, Subcutaneous, TID WC, Willia Craze, NP, 2 Units at 10/14/15 1231 . latanoprost (XALATAN) 0.005 % ophthalmic solution 1 drop, 1 drop, Both Eyes, QHS, Modena Jansky, MD, 1 drop at 10/13/15 2010 . LORazepam (ATIVAN) injection 1 mg, 1 mg, Intravenous, Q30 min PRN, Tanna Furry, MD, 1 mg at 10/12/15 1556 . LORazepam (ATIVAN) tablet 1 mg, 1 mg, Oral, Q6H PRN **OR** LORazepam (ATIVAN) injection 1 mg, 1 mg, Intravenous, Q6H PRN, Ripudeep K Rai, MD . multivitamin with minerals tablet 1 tablet, 1 tablet, Oral, Daily, Ripudeep Krystal Eaton, MD, 1 tablet at 10/14/15 0921 . senna-docusate (Senokot-S) tablet 1 tablet, 1 tablet, Oral, QHS PRN, Willia Craze, NP . thiamine (VITAMIN B-1) tablet 100 mg, 100 mg, Oral, Daily, 100 mg at 10/14/15 [id number] **OR** [DISCONTINUED] thiamine (B-1) injection 100 mg, 100 mg, Intravenous, Daily, Ripudeep K Rai, MD  Patients Current Diet: Diet heart healthy/carb modified Room service appropriate?: Yes; Fluid consistency:: Thin  Precautions / Restrictions Precautions Precautions: Fall Restrictions Weight Bearing Restrictions: No   Has the patient had 2 or more falls or a fall with injury in the past year?No  Prior Activity Level Community (5-7x/wk): Went out daily. Walked 1+  miles each day. Patient no longer drives. His wife drives.  Home Assistive Devices / Equipment Home Assistive Devices/Equipment: Eyeglasses Home Equipment: Shower seat, Grab bars - tub/shower, Cane - single point, Toilet riser  Prior Device Use: Indicate devices/aids used by the patient prior to current illness, exacerbation or injury? Straight cane, used when out of his house. Used no device when at home.  Prior Functional Level Prior Function Level of Independence: Independent with assistive device(s) Comments: Used cane occasionally but was independent with ADL's. (?loftstrand crutch)  Self Care: Did the patient need help bathing, dressing, using the toilet or eating? Independent  Indoor Mobility: Did the patient need assistance with walking from room to room (with or without device)? Independent  Stairs: Did the patient need assistance with internal or external stairs (with or without device)? Independent  Functional Cognition: Did the patient need help planning regular tasks such as shopping or remembering to take medications? Independent  Current Functional Level Cognition  Overall Cognitive Status: History of cognitive impairments - at baseline Orientation Level: Oriented X4   Extremity Assessment (includes Sensation/Coordination)  Upper Extremity Assessment: Defer to OT evaluation, RUE deficits/detail RUE Deficits / Details: residual effects from prior CVA. able to use RUE as functional assist RUE Coordination: decreased fine motor, decreased gross motor LUE Deficits / Details: Weaker distally. Able to move out of synergy pattern. R hand gross grasp/release.Poor strength @ 3/5. Able to oppose thumb to ring and middle finger and index but poor strength. Unable to use functional pinch to pick up objects. Unable to pick up utensils or hold a cup. No iin hand manipulation skills.  LUE Sensation: decreased light touch (appears impaired) LUE Coordination: decreased fine  motor, decreased gross motor (por finger to nose; ataxic)  Lower Extremity Assessment: Generalized weakness, RLE deficits/detail, LLE deficits/detail RLE Deficits / Details: Residual weakness from prior stroke LLE Deficits /  Details: Appears to have some weakness. Unsure how much is acute weakness vs. generalized weakness.     ADLs  Overall ADL's : Needs assistance/impaired Eating/Feeding: Minimal assistance Eating/Feeding Details (indicate cue type and reason): worked with pt during lunch. Using plate guard, adapted built up spoone (curved) and "sippy cup" with lid. Educated pt on using R hand as funcitonal assist to bujmpcup into L hand. wife presnt for education and need to properly set pt up with AE and placement of foods. Recommending taking foods out of small narromw dessert containers and placing these on pt's plate. Pt doing well with finger foods. REccomended to cut up sandwich into smaller bites to use as finger foods. Pt did well with this.  Grooming: Maximal assistance Upper Body Bathing: Maximal assistance Lower Body Bathing: Maximal assistance Upper Body Dressing : Maximal assistance Lower Body Dressing: Maximal assistance Toilet Transfer: Moderate assistance, Ambulation Toileting- Clothing Manipulation and Hygiene: Maximal assistance Functional mobility during ADLs: Moderate assistance, Cueing for safety, Cueing for sequencing (L bias)    Mobility  Overal bed mobility: Needs Assistance Bed Mobility: Supine to Sit Rolling: Supervision Sidelying to sit: Min assist Supine to sit: Min assist General bed mobility comments: Pt was instructed in log roll for ease of achieving EOB, however pt still scooted his LE's off EOB and sat straight up.     Transfers  Overall transfer level: Needs assistance Equipment used: Rolling walker (2 wheeled) Transfers: Sit to/from Stand Sit to Stand: Min assist, From elevated surface Stand pivot transfers: Mod assist General transfer  comment: Pt able to power-up to full stand with assist for balance/support. Pt had +2 for safety available.     Ambulation / Gait / Stairs / Wheelchair Mobility  Ambulation/Gait Ambulation/Gait assistance: Mod assist, +2 physical assistance, Min assist, +2 safety/equipment Ambulation Distance (Feet): 225 Feet Assistive device: Bilateral platform walker, 2 person hand held assist Gait Pattern/deviations: Step-through pattern, Decreased stride length, Shuffle General Gait Details: Initially pt ambulated 5 feet with +2 assist to assess level of balance assist required. Pt was given B platform walker and was able to demonstrate a smoother gait pattern. Continues to shuffle feet along but is able to improve floor clearance for a short time with cueing. Chair follow utilized but not required. At end of gait training as pt fatigued, increased L lateral lean was noted and assist was required to keep R legs of walker on the ground.  Gait velocity: Decreased Gait velocity interpretation: Below normal speed for age/gender    Posture / Balance Balance Overall balance assessment: Needs assistance Sitting-balance support: Feet supported, No upper extremity supported Sitting balance-Leahy Scale: Fair Standing balance support: Bilateral upper extremity supported, During functional activity Standing balance-Leahy Scale: Poor Standing balance comment: Dynamic balance    Special needs/care consideration BiPAP/CPAP Used CPAP up until 2 yrs ago. Lost weight and no longer uses CPAP CPM fNo Continuous Drip IV No Dialysis No  Life Vest No Oxygen No Special Bed No Trach Size No Wound Vac (area) No  Skin No  Bowel mgmt: Last BM documented as 10/13/15, but patient says no BM since admission  Bladder mgmt: Voiding Diabetic mgmt Yes, on oral medications at home and insulin since in the hospital.    Previous Home Environment Living Arrangements:  Spouse/significant other Available Help at Discharge: Family, Available 24 hours/day Type of Home: Other(Comment) (Townhome) Home Layout: Two level, Able to live on main level with bedroom/bathroom Home Access: Stairs to enter Entrance Stairs-Rails: Right, Left, Can reach both  Entrance Stairs-Number of Steps: 1 Bathroom Shower/Tub: Multimedia programmer: Handicapped height Bathroom Accessibility: Yes How Accessible: Accessible via walker (wife thinks so) Home Care Services: No  Discharge Living Setting Plans for Discharge Living Setting: Patient's home, Lives with (comment), Other (Comment) (Lives with wife in a condo.) Type of Home at Discharge: Other (Comment) (Condominium) Discharge Home Layout: Two level, Able to live on main level with bedroom/bathroom Alternate Level Stairs-Number of Steps: Flight Discharge Home Access: Stairs to enter [employer] of Steps: 2 steps. Does the patient have any problems obtaining your medications?: No  Social/Family/Support Systems Patient Roles: Spouse (Has a wife.) Contact Information: Annice Pih - wife Anticipated Caregiver: wife Anticipated Caregiver's Contact Information: [name] - wife [phone] Ability/Limitations of Caregiver: Wife is retired and can assist. Careers adviser: 24/7 Discharge Plan Discussed with Primary Caregiver: Yes Is Caregiver In Agreement with Plan?: Yes Does Caregiver/Family have Issues with Lodging/Transportation while Pt is in Rehab?: No  Goals/Additional Needs Patient/Family Goal for Rehab: PT min assist, OT supervision and min assist goals Expected length of stay: 16-19 days Cultural Considerations: [employer] Dietary Needs: Heart healthy, carb mod, thin liquids Equipment Needs: TBD Pt/Family Agrees to Admission and willing to participate: Yes Program Orientation Provided & Reviewed with Pt/Caregiver Including Roles & Responsibilities: Yes  Decrease burden of Care through IP  rehab admission: N/A  Possible need for SNF placement upon discharge: Not planned  Patient Condition: This patient's condition remains as documented in the consult dated 10/14/15, in which the Rehabilitation Physician determined and documented that the patient's condition is appropriate for intensive rehabilitative care in an inpatient rehabilitation facility. Will admit to inpatient rehab today.  Preadmission Screen Completed By: Retta Diones, 10/14/2015 1:28 PM ______________________________________________________________________  Discussed status with Dr. Posey Pronto on 10/14/15 at 1328 and received telephone approval for admission today.  Admission Coordinator: Retta Diones time1328/Date12/19/16          Cosigned by: Ankit Lorie Phenix, MD at 10/14/2015 1:40 PM  Revision History     Date/Time User Provider Type Action   10/14/2015 1:40 PM Ankit Lorie Phenix, MD Physician Cosign   10/14/2015 1:29 PM Retta Diones, RN Rehab Admission Coordinator Sign

## 2015-10-14 NOTE — Progress Notes (Signed)
Physical Therapy Treatment Patient Details Name: Darrell [name] MRN: [id number] DOB: 04-23-1946 Today's Date: 10/14/2015    History of Present Illness Pt is a 69 y/o male who presents s/p episode of L-sided weakness. He reports gait changes and inability to feed himself dinner - restaurant staff had to assist him to the hospital. PMH includes prior stroke with residual R sided weakness. 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    PT Comments    Pt progressing towards physical therapy goals. Pt was able to improve gait training this session with bilateral platform walker for improved balance and ability to manage the RW. Pt initially reports he is frustrated with his current mobility status, however at end of session his mood appeared to have improved. Pt is hopeful for CIR admission and to continue therapy. Will continue to follow.   Follow Up Recommendations  CIR;Supervision/Assistance - 24 hour     Equipment Recommendations  Other (comment) (TBD)    Recommendations for Other Services Rehab consult     Precautions / Restrictions Precautions Precautions: Fall Restrictions Weight Bearing Restrictions: No    Mobility  Bed Mobility Overal bed mobility: Needs Assistance Bed Mobility: Supine to Sit           General bed mobility comments: Pt was instructed in log roll for ease of achieving EOB, however pt still scooted his LE's off EOB and sat straight up.   Transfers Overall transfer level: Needs assistance Equipment used: Rolling walker (2 wheeled) Transfers: Sit to/from Stand Sit to Stand: Min assist;From elevated surface         General transfer comment: Pt able to power-up to full stand with assist for balance/support. Pt had +2 for safety available.   Ambulation/Gait Ambulation/Gait assistance: Mod assist;+2 physical assistance;Min assist;+2 safety/equipment Ambulation Distance (Feet): 225 Feet Assistive device: Bilateral platform walker;2 person  hand held assist Gait Pattern/deviations: Step-through pattern;Decreased stride length;Shuffle Gait velocity: Decreased Gait velocity interpretation: Below normal speed for age/gender General Gait Details: Initially pt ambulated 5 feet with +2 assist to assess level of balance assist required. Pt was given B platform walker and was able to demonstrate a smoother gait pattern. Continues to shuffle feet along but is able to improve floor clearance for a short time with cueing. Chair follow utilized but not required. At end of gait training as pt fatigued, increased L lateral lean was noted and assist was required to keep R legs of walker on the ground.    Stairs            Wheelchair Mobility    Modified Rankin (Stroke Patients Only) Modified Rankin (Stroke Patients Only) Pre-Morbid Rankin Score: Moderately severe disability Modified Rankin: Severe disability     Balance Overall balance assessment: Needs assistance Sitting-balance support: Feet supported;No upper extremity supported Sitting balance-Leahy Scale: Fair     Standing balance support: Bilateral upper extremity supported;During functional activity Standing balance-Leahy Scale: Poor                      Cognition Arousal/Alertness: Awake/alert Behavior During Therapy: WFL for tasks assessed/performed Overall Cognitive Status: History of cognitive impairments - at baseline       Memory: Decreased short-term memory              Exercises      General Comments        Pertinent Vitals/Pain Pain Assessment: No/denies pain    Home Living  Prior Function            PT Goals (current goals can now be found in the care plan section) Acute Rehab PT Goals Patient Stated Goal: to get better PT Goal Formulation: With patient/family Time For Goal Achievement: 10/20/15 Potential to Achieve Goals: Good    Frequency  Min 4X/week    PT Plan Current plan remains  appropriate    Co-evaluation             End of Session Equipment Utilized During Treatment: Gait belt Activity Tolerance: Patient tolerated treatment well Patient left: in chair;with call bell/phone within reach;with family/visitor present     Time: 0836-0900 PT Time Calculation (min) (ACUTE ONLY): 24 min  Charges:  $Gait Training: 23-37 mins                    G Codes:      Rolinda Roan 2015/10/24, 12:58 PM   Rolinda Roan, PT, DPT Acute Rehabilitation Services Pager: [phone]

## 2015-10-14 NOTE — Progress Notes (Signed)
Pt transferred to Rehab from 5M01 to 4M03. Alert and orientated x4. Diagnostic appropriate information provided. Orientated to rehab schedule, expectations, etc. Pt looking forward to starting therapy in the morning. Wife at bedside during orientation.

## 2015-10-14 NOTE — Progress Notes (Signed)
Ankit Lorie Phenix, MD Physician Signed Physical Medicine and Rehabilitation Consult Note 10/14/2015 5:52 AM  Related encounter: ED to Hosp-Admission (Discharged) from 10/12/2015 in [place] Collapse [facility]        Physical Medicine and Rehabilitation Consult Reason for Consult: Acute nonhemorrhagic white matter infarct in the right centrum semi, remote lacunar infarcts basal ganglia on the left and right. Referring Physician: [facility]   HPI: Darrell [name] is a 69 y.o. 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 Patient lives with spouse to level townhome 2 step to entry bedroom on first floor. Patient used a cane prior to admission. Patient required help with ADLs at baseline. Wife can assist as needed at discharge. Presented 10/12/2015 with left-sided weakness and slurred speech. MRI of the brain showed acute nonhemorrhagic white matter infarct in the right centrum semi-as well as progressive remote lacunar infarcts in the basal ganglia on the left stable remote lacunar infarcts basal ganglia on the right. Patient did not receive TPA. MRA of the head mild medium and distal small vessel disease without significant stenosis or occlusion. Echocardiogram with ejection fraction of [id number] grade 1 diastolic dysfunction. Carotid Dopplers with no evidence of ICA stenosis. Neurology follow-up maintain on aspirin for CVA prophylaxis subcutaneous Lovenox for DVT prophylaxis. Tolerating a regular consistency diet. Physical therapy evaluation completed 10/13/2015 with recommendations of physical medicine rehabilitation consult.  Review of Systems  Constitutional: Negative for fever and chills.  HENT: Negative for hearing loss.  Eyes: Negative for blurred vision  and double vision.  Respiratory: Negative for cough and shortness of breath.  Cardiovascular: Negative for chest pain, palpitations and leg swelling.  Gastrointestinal: Positive for constipation. Negative for nausea and vomiting.  Genitourinary: Negative for dysuria and hematuria.  Musculoskeletal: Positive for joint pain.  Skin: Negative for rash.  Neurological: Positive for focal weakness (Acute left sided weakness with history of right-sided weakness residual after CVA). Negative for seizures, loss of consciousness and headaches.  Psychiatric/Behavioral: Positive for depression.  All other systems reviewed and are negative.  Past Medical History  Diagnosis Date  . Diabetes mellitus   . Hypertension     dr Sherren Mocha  . Sleep apnea     cpap sleep study > 4 yrs; no longer wearing CPAP  . Stroke [facility])     rt side weakness, 2013   Past Surgical History  Procedure Laterality Date  . Heel spur surgery    . [place] surgery    . Endarterectomy  12/09/2011    Procedure: ENDARTERECTOMY CAROTID; Surgeon: Tinnie Gens, MD; Location: [facility] OR; Service: Vascular; Laterality: Left; Left Carotid endarterectomy with dacron patch angioplasty  . Carotid endarterectomy  12/09/11    Left  . Hernia repair      x2  . Ankle surgery    . Inguinal hernia repair Left 01/22/2014    Procedure: LEFT INGUINAL HERNIA REPAIR WITH MESH; Surgeon: [name], MD; Location: [place] [facility]; Service: General; Laterality: Left;  . Insertion of mesh Left 01/22/2014    Procedure: INSERTION OF MESH; Surgeon: [name], MD; Location: [place]; Service: General; Laterality: Left;  . Colonoscopy     Family History  Problem Relation Age of Onset  . Heart disease Father   . Colon cancer Father     at age 54  .  Esophageal cancer Neg Hx   . Rectal cancer Neg Hx   . Stomach cancer Neg Hx    Social History:  reports  that he quit smoking about 13 years ago. His smoking use included Cigarettes. He has a 75 pack-year smoking history. He has never used smokeless tobacco. He reports that he does not drink alcohol or use illicit drugs. Allergies:  Allergies  Allergen Reactions  . Oxycodone-Acetaminophen     REACTION: feels sick (patient reports taking acetaminophen without issue)    Medications Prior to Admission  Medication Sig Dispense Refill  . citalopram (CELEXA) 10 MG tablet Take 1 tablet (10 mg total) by mouth daily. 100 tablet 3  . lisinopril (PRINIVIL,ZESTRIL) 20 MG tablet Take 1 tablet (20 mg total) by mouth daily. 90 tablet 3  . metFORMIN (GLUCOPHAGE) 1000 MG tablet take 1 tablet by mouth twice a day with meals 200 tablet 3  . ONE TOUCH ULTRA TEST test strip TEST once daily (Patient not taking: Reported on 10/12/2015) 100 each PRN    Home: Home Living Family/patient expects to be discharged to:: Private residence Living Arrangements: Spouse/significant other Available Help at Discharge: Family, Available 24 hours/day Type of Home: Other(Comment) (Townhome) Home Access: Stairs to enter [employer] of Steps: 1 Entrance Stairs-Rails: Right, Left, Can reach both Home Layout: Two level, Able to live on main level with bedroom/bathroom Bathroom Shower/Tub: Multimedia programmer: Handicapped height Bathroom Accessibility: Yes Home Equipment: Shower seat, Grab bars - tub/shower, Cane - single point, Toilet riser  Functional History: Prior Function Level of Independence: Independent with assistive device(s) Comments: Used cane occasionally but was independent with ADL's. (?loftstrand crutch) Functional Status:  Mobility: Bed Mobility Overal bed mobility: Needs Assistance Bed Mobility: Rolling, Sidelying to Sit, Supine to Sit Rolling: Supervision Sidelying to sit: Min assist Supine to sit: Min assist General bed mobility comments: Pt  attempted to transition to EOB x2 different ways and required min assist with log roll and supine>sit to elevate trunk to full sitting position.  Transfers Overall transfer level: Needs assistance Equipment used: Rolling walker (2 wheeled) Transfers: Sit to/from Stand Sit to Stand: Min assist Stand pivot transfers: Mod assist General transfer comment: Therapist assist to position R hand on walker and maintain positioning. Pt wanted to try RW for support.  Ambulation/Gait Ambulation/Gait assistance: Mod assist, Max assist Ambulation Distance (Feet): 100 Feet Assistive device: Rolling walker (2 wheeled), Straight cane Gait Pattern/deviations: Step-through pattern, Decreased stride length, Trunk flexed, Ataxic, Staggering left General Gait Details: Therapist held R hand on walker and weighted the R side ot maintain 4 legs of walker on the ground. Mod to max assist at times to maintain balance and provide support during ambulation. SPC was provided to simulate home environment per pt request, and max assist was required to maintain balance. RW was again provided and therapist assisted pt back to the room with a weighted R side of walker.  Gait velocity: Decreased Gait velocity interpretation: Below normal speed for age/gender    ADL: ADL Overall ADL's : Needs assistance/impaired Eating/Feeding: Minimal assistance Eating/Feeding Details (indicate cue type and reason): worked with pt during lunch. Using plate guard, adapted built up spoone (curved) and "sippy cup" with lid. Educated pt on using R hand as funcitonal assist to bujmpcup into L hand. wife presnt for education and need to properly set pt up with AE and placement of foods. Recommending taking foods out of small narromw dessert containers and placing these on pt's plate. Pt doing well  with finger foods. REccomended to cut up sandwich into smaller bites to use as finger foods. Pt did well with this.  Grooming: Maximal assistance Upper  Body Bathing: Maximal assistance Lower Body Bathing: Maximal assistance Upper Body Dressing : Maximal assistance Lower Body Dressing: Maximal assistance Toilet Transfer: Moderate assistance, Ambulation Toileting- Clothing Manipulation and Hygiene: Maximal assistance Functional mobility during ADLs: Moderate assistance, Cueing for safety, Cueing for sequencing (L bias)  Cognition: Cognition Overall Cognitive Status: History of cognitive impairments - at baseline Orientation Level: Oriented X4 Cognition Arousal/Alertness: Awake/alert Behavior During Therapy: WFL for tasks assessed/performed Overall Cognitive Status: History of cognitive impairments - at baseline Memory: Decreased short-term memory  Blood pressure 145/84, pulse 71, temperature 97.8 F (36.6 C), temperature source Oral, resp. rate 18, height 5\' 10"  (1.778 m), weight 93.441 kg (206 lb), SpO2 90 %. Physical Exam  Vitals reviewed. Constitutional: He is oriented to person, place, and time. He appears well-developed and well-nourished.  HENT:  Head: Normocephalic and atraumatic.  Eyes: Conjunctivae and EOM are normal.  Neck: Normal range of motion. Neck supple. No thyromegaly present.  Cardiovascular: Normal rate and regular rhythm.  Respiratory: Effort normal and breath sounds normal. No respiratory distress.  GI: Soft. Bowel sounds are normal. He exhibits no distension.  Musculoskeletal: He exhibits no edema or tenderness.  Neurological: He is alert and oriented to person, place, and time.  Follows commands 3+ DTRs throughout Sensation intact to light touch Motor: RUE: 4+/5 proximal to distal, flexor spasticity LUE: Shoulder abduction, elbow flexion and extension 3+/5, hand grip 3 -/5 B/L LE: 5/5  Skin: Skin is warm and dry.  Psychiatric: He has a normal mood and affect. His behavior is normal.     Lab Results Last 24 Hours    Results for orders placed or performed during the hospital encounter of 10/12/15  (from the past 24 hour(s))  Glucose, capillary Status: Abnormal   Collection Time: 10/13/15 6:25 AM  Result Value Ref Range   Glucose-Capillary 124 (H) 65 - 99 mg/dL   Comment 1 Notify RN    Comment 2 Document in Chart   Glucose, capillary Status: Abnormal   Collection Time: 10/13/15 11:36 AM  Result Value Ref Range   Glucose-Capillary 141 (H) 65 - 99 mg/dL  Glucose, capillary Status: Abnormal   Collection Time: 10/13/15 4:21 PM  Result Value Ref Range   Glucose-Capillary 116 (H) 65 - 99 mg/dL  Glucose, capillary Status: Abnormal   Collection Time: 10/13/15 9:18 PM  Result Value Ref Range   Glucose-Capillary 125 (H) 65 - 99 mg/dL   Comment 1 Notify RN    Comment 2 Document in Chart       Imaging Results (Last 48 hours)    Dg Chest 2 View  10/12/2015 CLINICAL DATA: Patient with weakness for 1 day. EXAM: CHEST 2 VIEW COMPARISON: Chest radiograph 10/13/2013. FINDINGS: Stable enlarged cardiac and mediastinal contours. Pulmonary vascular redistribution. No large area of pulmonary consolidation. No pleural effusion or pneumothorax. Probable healing posterior right eighth and ninth rib fractures. Thoracic spine degenerative changes. IMPRESSION: Cardiomegaly with pulmonary vascular redistribution. Probable healing posterior right rib fractures. Electronically Signed By: Lovey Newcomer M.D. On: 10/12/2015 13:19   Ct Head Wo Contrast  10/12/2015 CLINICAL DATA: Left-sided weakness. History of stroke. EXAM: CT HEAD WITHOUT CONTRAST TECHNIQUE: Contiguous axial images were obtained from the base of the skull through the vertex without intravenous contrast. COMPARISON: 10/12/2013 FINDINGS: Large area of chronic encephalomalacia related to prior stroke involving the left frontal and  parietal lobes appears stable. Several old lacunar infarcts bilaterally or also stable involving the basal ganglia. The brain demonstrates  no evidence of hemorrhage, acute infarction, edema, mass effect, extra-axial fluid collection, hydrocephalus or mass lesion. The skull is unremarkable. IMPRESSION: Stable appearance of the brain by head CT with old left-sided hemispheric infarct present as well as multiple old bilateral lacunar infarcts. No acute findings. Electronically Signed By: Aletta Edouard M.D. On: 10/12/2015 14:42   Mr Brain Wo Contrast (neuro Protocol)  10/12/2015 CLINICAL DATA: New onset weakness yesterday. Left upper extremity weakness. EXAM: MRI HEAD WITHOUT CONTRAST TECHNIQUE: Multiplanar, multiecho pulse sequences of the brain and surrounding structures were obtained without intravenous contrast. COMPARISON: CT head without contrast 10/12/2015. MRI brain 12/09/2011. FINDINGS: An acute nonhemorrhagic 11 mm linear infarct is present in the right centrum semiovale. No other acute infarct is present. A large remote left MCA infarct is noted. Separate from the infarct territories, periventricular T2 changes have progressed bilaterally. Multiple remote lacunar infarcts are again noted in the right basal ganglia. New left-sided basal ganglia lacunar infarcts are present since the last MRI. These are not acute. There is ex vacuo dilation of the left lateral ventricle. White matter changes extend into the brainstem. Remote lacunar infarcts in the cerebellum bilaterally are stable. PE internal auditory canals are normal bilaterally. Flow is present in the major intracranial arteries. Globes and orbits are intact. The paranasal sinuses and mastoid air cells are clear. IMPRESSION: 1. 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 2. Expected progression describes that expected evolution of large left MCA territory nonhemorrhagic infarct. 3. Progressive remote lacunar infarcts in the basal ganglia on the left. 4. Stable remote lacunar infarcts in the basal ganglia on the right. 5.  Progressive periventricular white matter changes bilaterally. These results were called by telephone at the time of interpretation on 10/12/2015 at 4:47 pm to Dr. Jeneen Rinks, who verbally acknowledged these results. Electronically Signed By: San Morelle M.D. On: 10/12/2015 16:48   Mr Jodene Nam Head/brain Wo Cm  10/12/2015 CLINICAL DATA: New onset of left-sided weakness. Acute right periventricular white matter infarct. EXAM: MRA HEAD WITHOUT CONTRAST TECHNIQUE: Angiographic images of the Circle of Willis were obtained using MRA technique without intravenous contrast. COMPARISON: MRI brain from the same day. FINDINGS: The internal carotid artery is are within normal limits shin high cervical segments through the ICA termini bilaterally. The A1 and M1 segments are normal. The MCA bifurcations are intact. There is mild attenuation of distal MCA branch vessels bilaterally. The vertebral arteries are codominant. The left PICA origin is visualized and normal. The right PICA origin is not visualized. The basilar artery is normal. Both posterior cerebral arteries originate from the basilar tip. The PCA branch vessels are intact. IMPRESSION: 1. Mild medium and distal small vessel disease without significant proximal stenosis, aneurysm, or branch vessel occlusion. Electronically Signed By: San Morelle M.D. On: 10/12/2015 19:06     Assessment/Plan: Diagnosis: Acute nonhemorrhagic white matter infarct in the right centrum semi Labs and images independently reviewed. Records reviewed and summated above. Stroke: Continue secondary stroke prophylaxis and Risk Factor Modification listed below:  Antiplatelet therapy Blood Pressure Management: Continue current medication with prn's with permisive HTN per primary team Statin Agent Diabetes management B/l hemiparesis: fit for orthotics to prevent contractures (resting hand splint for day, wrist cock up splint at night, PRAFO, etc)  Motor  recovery: Fluoxetine  1. Does the need for close, 24 hr/day medical supervision in concert  with the patient's rehab needs make it unreasonable for this patient to be served in a less intensive setting? Yes  2. Co-Morbidities requiring supervision/potential complications: HTN (monitor and provide prns in accordance with increased physical exertion and pain), diabetes mellitus and peripheral neuropathy (Monitor in accordance with exercise and adjust meds as necessary), left MCA/PCA watershed infarct 2013 with right-sided residual weakness and spasticity, OSA (monitor for signs and symptoms of daytime fatigue)  3. Due to safety, disease management, medication administration and patient education, does the patient require 24 hr/day rehab nursing? Yes 4. Does the patient require coordinated care of a physician, rehab nurse, PT (1-2 hrs/day, 5 days/week) and OT (1-2 hrs/day, 5 days/week) to address physical and functional deficits in the context of the above medical diagnosis(es)? Yes Addressing deficits in the following areas: balance, endurance, locomotion, strength, transferring, bathing, dressing, toileting and psychosocial support 5. Can the patient actively participate in an intensive therapy program of at least 3 hrs of therapy per day at least 5 days per week? Yes 6. The potential for patient to make measurable gains while on inpatient rehab is good 7. Anticipated functional outcomes upon discharge from inpatient rehab are min assist with PT, supervision and min assist with OT, n/a with SLP. 8. Estimated rehab length of stay to reach the above functional goals is: 16-19 days. 9. Does the patient have adequate social supports and living environment to accommodate these discharge functional goals? Yes 10. Anticipated D/C setting: Home 11. Anticipated post D/C treatments: HH therapy and Home excercise program 12. Overall Rehab/Functional Prognosis: good and fair  RECOMMENDATIONS: This patient's  condition is appropriate for continued rehabilitative care in the following setting: CIR Patient has agreed to participate in recommended program. Yes Note that insurance prior authorization may be required for reimbursement for recommended care.  Comment: Rehab Admissions Coordinator to follow up.  Delice Lesch, MD 10/14/2015       Revision History     Date/Time User Provider Type Action   10/14/2015 12:35 PM Ankit Lorie Phenix, MD Physician Sign   10/14/2015 10:51 AM Cathlyn Parsons, PA-C Physician Assistant Pend   View Details Report       Routing History     Date/Time From To Method   10/14/2015 12:35 PM Ankit Lorie Phenix, MD Ankit Lorie Phenix, MD In [facility]   10/14/2015 12:35 PM Ankit Lorie Phenix, MD Dorena Cookey, MD In [facility]

## 2015-10-14 NOTE — H&P (Signed)
Physical Medicine and Rehabilitation Admission H&P    Chief Complaint  Patient presents with  . Cerebrovascular Accident  : HPI: Darrell [name] is a 69 y.o. 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 Patient lives with spouse to level townhome 2 step to entry bedroom on first floor. Patient used a cane prior to admission. Patient required help with ADLs at baseline. Wife can assist as needed at discharge. Presented 10/12/2015 with left-sided weakness and slurred speech. MRI of the brain showed acute nonhemorrhagic white matter infarct in the right centrum semi-as well as progressive remote lacunar infarcts in the basal ganglia on the left stable remote lacunar infarcts basal ganglia on the right. Patient did not receive TPA. MRA of the head mild medium and distal small vessel disease without significant stenosis or occlusion. Echocardiogram with ejection fraction of 02% grade 1 diastolic dysfunction. Carotid Dopplers with no evidence of ICA stenosis. Neurology follow-up maintain on aspirin for CVA prophylaxis subcutaneous Lovenox for DVT prophylaxis. Tolerating a regular consistency diet. Physical therapy evaluation completed 10/13/2015 with recommendations of physical medicine rehabilitation consult. Patient was admitted for a comprehensive rehabilitation program  ROS Constitutional: Negative for fever and chills.  HENT: Negative for hearing loss.  Eyes: Negative for blurred vision and double vision.  Respiratory: Negative for cough and shortness of breath.  Cardiovascular: Negative for chest pain, palpitations and leg swelling.  Gastrointestinal: Positive for constipation. Negative for nausea and vomiting.  Genitourinary: Negative for dysuria and hematuria.    Musculoskeletal: Positive for joint pain.  Skin: Negative for rash.  Neurological: Positive for focal weakness (Acute left sided weakness with history of right-sided weakness residual after CVA). Negative for seizures, loss of consciousness and headaches.  Psychiatric/Behavioral: Positive for depression.  All other systems reviewed and are negative   Past Medical History  Diagnosis Date  . Diabetes mellitus   . Hypertension     dr Sherren Mocha  . Sleep apnea     cpap      sleep study > 4 yrs; no longer wearing CPAP  . Stroke [facility])     rt side weakness, 2013   Past Surgical History  Procedure Laterality Date  . Heel spur surgery    . Cole surgery    . Endarterectomy  12/09/2011    Procedure: ENDARTERECTOMY CAROTID;  Surgeon: Tinnie Gens, MD;  Location: [facility] OR;  Service: Vascular;  Laterality: Left;  Left Carotid endarterectomy with dacron patch angioplasty  . Carotid endarterectomy  12/09/11    Left  . Hernia repair      x2  . Ankle surgery    . Inguinal hernia repair Left 01/22/2014    Procedure: LEFT INGUINAL HERNIA REPAIR WITH MESH;  Surgeon: [name], MD;  Location: [place];  Service: General;  Laterality: Left;  . Insertion of mesh Left 01/22/2014    Procedure: INSERTION OF MESH;  Surgeon: [name], MD;  Location: [place];  Service: General;  Laterality: Left;  . Colonoscopy     Family History  Problem Relation Age of Onset  . Heart disease Father   . Colon cancer Father     at age 25  . Esophageal cancer Neg Hx   . Rectal cancer Neg Hx   . Stomach cancer Neg Hx    Social History:  reports that he  quit smoking about 13 years ago. His smoking use included Cigarettes. He has a 75 pack-year smoking history. He has never used smokeless tobacco. He reports that he does not drink alcohol or use illicit drugs. Allergies:  Allergies  Allergen Reactions  . Oxycodone-Acetaminophen     REACTION: feels sick (patient reports taking acetaminophen without issue)    Medications  Prior to Admission  Medication Sig Dispense Refill  . citalopram (CELEXA) 10 MG tablet Take 1 tablet (10 mg total) by mouth daily. 100 tablet 3  . lisinopril (PRINIVIL,ZESTRIL) 20 MG tablet Take 1 tablet (20 mg total) by mouth daily. 90 tablet 3  . metFORMIN (GLUCOPHAGE) 1000 MG tablet take 1 tablet by mouth twice a day with meals 200 tablet 3  . ONE TOUCH ULTRA TEST test strip TEST once daily (Patient not taking: Reported on 10/12/2015) 100 each PRN    Home: Home Living Family/patient expects to be discharged to:: Private residence Living Arrangements: Spouse/significant other Available Help at Discharge: Family, Available 24 hours/day Type of Home: Other(Comment) (Townhome) Home Access: Stairs to enter [employer] of Steps: 1 Entrance Stairs-Rails: Right, Left, Can reach both Home Layout: Two level, Able to live on main level with bedroom/bathroom Bathroom Shower/Tub: Multimedia programmer: Handicapped height Bathroom Accessibility: Yes Home Equipment: Shower seat, Grab bars - tub/shower, Cane - single point, Toilet riser   Functional History: Prior Function Level of Independence: Independent with assistive device(s) Comments: Used cane occasionally but was independent with ADL's. (?loftstrand crutch)  Functional Status:  Mobility: Bed Mobility Overal bed mobility: Needs Assistance Bed Mobility: Rolling, Sidelying to Sit, Supine to Sit Rolling: Supervision Sidelying to sit: Min assist Supine to sit: Min assist General bed mobility comments: Pt attempted to transition to EOB x2 different ways and required min assist with log roll and supine>sit to elevate trunk to full sitting position.  Transfers Overall transfer level: Needs assistance Equipment used: Rolling walker (2 wheeled) Transfers: Sit to/from Stand Sit to Stand: Min assist Stand pivot transfers: Mod assist General transfer comment: Therapist assist to position R hand on walker and maintain  positioning. Pt wanted to try RW for support.  Ambulation/Gait Ambulation/Gait assistance: Mod assist, Max assist Ambulation Distance (Feet): 100 Feet Assistive device: Rolling walker (2 wheeled), Straight cane Gait Pattern/deviations: Step-through pattern, Decreased stride length, Trunk flexed, Ataxic, Staggering left General Gait Details: Therapist held R hand on walker and weighted the R side ot maintain 4 legs of walker on the ground. Mod to max assist at times to maintain balance and provide support during ambulation. SPC was provided to simulate home environment per pt request, and max assist was required to maintain balance. RW was again provided and therapist assisted pt back to the room with a weighted R side of walker.  Gait velocity: Decreased Gait velocity interpretation: Below normal speed for age/gender    ADL: ADL Overall ADL's : Needs assistance/impaired Eating/Feeding: Minimal assistance Eating/Feeding Details (indicate cue type and reason): worked with pt during lunch. Using plate guard, adapted built up spoone (curved) and "sippy cup" with lid. Educated pt on using R hand as funcitonal assist to bujmpcup into L hand. wife presnt for education and need to properly set pt up with AE and placement of foods. Recommending taking foods out of small narromw dessert containers and placing these on pt's plate. Pt doing well with finger foods. REccomended to cut up sandwich into smaller bites to use as finger foods. Pt did well with this.  Grooming: Maximal assistance  Upper Body Bathing: Maximal assistance Lower Body Bathing: Maximal assistance Upper Body Dressing : Maximal assistance Lower Body Dressing: Maximal assistance Toilet Transfer: Moderate assistance, Ambulation Toileting- Clothing Manipulation and Hygiene: Maximal assistance Functional mobility during ADLs: Moderate assistance, Cueing for safety, Cueing for sequencing (L bias)  Cognition: Cognition Overall Cognitive  Status: History of cognitive impairments - at baseline Orientation Level: Oriented X4 Cognition Arousal/Alertness: Awake/alert Behavior During Therapy: WFL for tasks assessed/performed Overall Cognitive Status: History of cognitive impairments - at baseline Memory: Decreased short-term memory  Physical Exam: Blood pressure 145/84, pulse 71, temperature 97.8 F (36.6 C), temperature source Oral, resp. rate 18, height _0  (1.778 m), weight 93.441 kg (206 lb), SpO2 90 %. Physical Exam Constitutional: He is oriented to person, place, and time. He appears well-developed and well-nourished.  HENT:  Head: Normocephalic and atraumatic.  Eyes: Conjunctivae and EOM are normal.  Neck: Normal range of motion. Neck supple. No thyromegaly present.  Cardiovascular: Normal rate and regular rhythm.  Respiratory: Effort normal and breath sounds normal. No respiratory distress.  GI: Soft. Bowel sounds are normal. He exhibits no distension.  Musculoskeletal: He exhibits no edema or tenderness.  Neurological: He is alert and oriented to person, place, and time.  Follows commands 3+ DTRs throughout Sensation intact to light touch Motor: RUE: 4+/5 proximal to distal, flexor spasticity LUE: Shoulder abduction, elbow flexion and extension 3+/5, hand grip 3 -/5 B/L LE: 5/5  Skin: Skin is warm and dry.  Psychiatric: He has a normal mood and affect. His behavior is normal   Results for orders placed or performed during the hospital encounter of 10/12/15 (from the past 48 hour(s))  CBG monitoring, ED     Status: Abnormal   Collection Time: 10/12/15  1:24 PM  Result Value Ref Range   Glucose-Capillary 111 (H) 65 - 99 mg/dL  Urine rapid drug screen (hosp performed)not at [facility]     Status: None   Collection Time: 10/12/15  2:52 PM  Result Value Ref Range   Opiates NONE DETECTED NONE DETECTED   Cocaine NONE DETECTED NONE DETECTED   Benzodiazepines NONE DETECTED NONE DETECTED   Amphetamines NONE  DETECTED NONE DETECTED   Tetrahydrocannabinol NONE DETECTED NONE DETECTED   Barbiturates NONE DETECTED NONE DETECTED    Comment:        DRUG SCREEN FOR MEDICAL PURPOSES ONLY.  IF CONFIRMATION IS NEEDED FOR ANY PURPOSE, NOTIFY LAB WITHIN 5 DAYS.        LOWEST DETECTABLE LIMITS FOR URINE DRUG SCREEN Drug Class       Cutoff (ng/mL) Amphetamine      1000 Barbiturate      200 Benzodiazepine   195 Tricyclics       093 Opiates          300 Cocaine          300 THC              50   Urinalysis, Routine w reflex microscopic (not at [facility])     Status: Abnormal   Collection Time: 10/12/15  2:52 PM  Result Value Ref Range   Color, Urine AMBER (A) YELLOW    Comment: BIOCHEMICALS MAY BE AFFECTED BY COLOR   APPearance CLEAR CLEAR   Specific Gravity, Urine 1.028 1.005 - 1.030   pH 5.5 5.0 - 8.0   Glucose, UA NEGATIVE NEGATIVE mg/dL   Hgb urine dipstick NEGATIVE NEGATIVE   Bilirubin Urine SMALL (A) NEGATIVE   Ketones, ur 15 (A) NEGATIVE mg/dL  Protein, ur NEGATIVE NEGATIVE mg/dL   Nitrite NEGATIVE NEGATIVE   Leukocytes, UA TRACE (A) NEGATIVE  Urine microscopic-add on     Status: Abnormal   Collection Time: 10/12/15  2:52 PM  Result Value Ref Range   Squamous Epithelial / LPF 0-5 (A) NONE SEEN   WBC, UA 0-5 0 - 5 WBC/hpf   RBC / HPF 0-5 0 - 5 RBC/hpf   Bacteria, UA NONE SEEN NONE SEEN   Urine-Other MUCOUS PRESENT   Glucose, capillary     Status: Abnormal   Collection Time: 10/12/15  9:05 PM  Result Value Ref Range   Glucose-Capillary 195 (H) 65 - 99 mg/dL   Comment 1 Notify RN    Comment 2 Document in Chart   Magnesium     Status: None   Collection Time: 10/13/15  1:08 AM  Result Value Ref Range   Magnesium 2.1 1.7 - 2.4 mg/dL  Hemoglobin A1c     Status: Abnormal   Collection Time: 10/13/15  2:58 AM  Result Value Ref Range   Hgb A1c MFr Bld 6.9 (H) 4.8 - 5.6 %    Comment: (NOTE)         Pre-diabetes: 5.7 - 6.4         Diabetes: >6.4         Glycemic control for adults with  diabetes: <7.0    Mean Plasma Glucose 151 mg/dL    Comment: (NOTE) Performed At: [facility] [place], [place] [id number] Lindon Romp MD [id number]   CBC     Status: None   Collection Time: 10/13/15  2:58 AM  Result Value Ref Range   WBC 8.6 4.0 - 10.5 K/uL   RBC 5.01 4.22 - 5.81 MIL/uL   Hemoglobin 15.0 13.0 - 17.0 g/dL   HCT 44.4 39.0 - 52.0 %   MCV 88.6 78.0 - 100.0 fL   MCH 29.9 26.0 - 34.0 pg   MCHC 33.8 30.0 - 36.0 g/dL   RDW 13.5 11.5 - 15.5 %   Platelets 161 150 - 400 K/uL  Basic metabolic panel     Status: Abnormal   Collection Time: 10/13/15  2:58 AM  Result Value Ref Range   Sodium 137 135 - 145 mmol/L   Potassium 3.2 (L) 3.5 - 5.1 mmol/L   Chloride 105 101 - 111 mmol/L   CO2 24 22 - 32 mmol/L   Glucose, Bld 127 (H) 65 - 99 mg/dL   BUN 10 6 - 20 mg/dL   Creatinine, Ser 0.69 0.61 - 1.24 mg/dL   Calcium 8.7 (L) 8.9 - 10.3 mg/dL   GFR calc non Af Amer >60 >60 mL/min   GFR calc Af Amer >60 >60 mL/min    Comment: (NOTE) The eGFR has been calculated using the CKD EPI equation. This calculation has not been validated in all clinical situations. eGFR's persistently <60 mL/min signify possible Chronic Kidney Disease.    Anion gap 8 5 - 15  Lipid panel     Status: Abnormal   Collection Time: 10/13/15  2:58 AM  Result Value Ref Range   Cholesterol 76 0 - 200 mg/dL   Triglycerides 96 <150 mg/dL   HDL 17 (L) >40 mg/dL   Total CHOL/HDL Ratio 4.5 RATIO   VLDL 19 0 - 40 mg/dL   LDL Cholesterol 40 0 - 99 mg/dL    Comment:        Total Cholesterol/HDL:CHD Risk Coronary Heart Disease Risk Table  Men   Women  1/2 Average Risk   3.4   3.3  Average Risk       5.0   4.4  2 X Average Risk   9.6   7.1  3 X Average Risk  23.4   11.0        Use the calculated Patient Ratio above and the CHD Risk Table to determine the patient's CHD Risk.        ATP III CLASSIFICATION (LDL):  <100     mg/dL   Optimal  100-129  mg/dL    Near or Above                    Optimal  130-159  mg/dL   Borderline  160-189  mg/dL   High  >190     mg/dL   Very High   Glucose, capillary     Status: Abnormal   Collection Time: 10/13/15  6:25 AM  Result Value Ref Range   Glucose-Capillary 124 (H) 65 - 99 mg/dL   Comment 1 Notify RN    Comment 2 Document in Chart   Glucose, capillary     Status: Abnormal   Collection Time: 10/13/15 11:36 AM  Result Value Ref Range   Glucose-Capillary 141 (H) 65 - 99 mg/dL  Glucose, capillary     Status: Abnormal   Collection Time: 10/13/15  4:21 PM  Result Value Ref Range   Glucose-Capillary 116 (H) 65 - 99 mg/dL  Glucose, capillary     Status: Abnormal   Collection Time: 10/13/15  9:18 PM  Result Value Ref Range   Glucose-Capillary 125 (H) 65 - 99 mg/dL   Comment 1 Notify RN    Comment 2 Document in Chart   Glucose, capillary     Status: Abnormal   Collection Time: 10/14/15  6:35 AM  Result Value Ref Range   Glucose-Capillary 122 (H) 65 - 99 mg/dL   Comment 1 Notify RN    Comment 2 Document in Chart   Basic metabolic panel     Status: Abnormal   Collection Time: 10/14/15  6:55 AM  Result Value Ref Range   Sodium 137 135 - 145 mmol/L   Potassium 3.7 3.5 - 5.1 mmol/L   Chloride 103 101 - 111 mmol/L   CO2 26 22 - 32 mmol/L   Glucose, Bld 143 (H) 65 - 99 mg/dL   BUN 10 6 - 20 mg/dL   Creatinine, Ser 0.69 0.61 - 1.24 mg/dL   Calcium 8.5 (L) 8.9 - 10.3 mg/dL   GFR calc non Af Amer >60 >60 mL/min   GFR calc Af Amer >60 >60 mL/min    Comment: (NOTE) The eGFR has been calculated using the CKD EPI equation. This calculation has not been validated in all clinical situations. eGFR's persistently <60 mL/min signify possible Chronic Kidney Disease.    Anion gap 8 5 - 15   Dg Chest 2 View  10/12/2015  CLINICAL DATA:  Patient with weakness for 1 day. EXAM: CHEST  2 VIEW COMPARISON:  Chest radiograph 10/13/2013. FINDINGS: Stable enlarged cardiac and mediastinal contours. Pulmonary vascular  redistribution. No large area of pulmonary consolidation. No pleural effusion or pneumothorax. Probable healing posterior right eighth and ninth rib fractures. Thoracic spine degenerative changes. IMPRESSION: Cardiomegaly with pulmonary vascular redistribution. Probable healing posterior right rib fractures. Electronically Signed   By: Lovey Newcomer M.D.   On: 10/12/2015 13:19   Ct Head Wo Contrast  10/12/2015  CLINICAL  DATA:  Left-sided weakness.  History of stroke. EXAM: CT HEAD WITHOUT CONTRAST TECHNIQUE: Contiguous axial images were obtained from the base of the skull through the vertex without intravenous contrast. COMPARISON:  10/12/2013 FINDINGS: Large area of chronic encephalomalacia related to prior stroke involving the left frontal and parietal lobes appears stable. Several old lacunar infarcts bilaterally or also stable involving the basal ganglia. The brain demonstrates no evidence of hemorrhage, acute infarction, edema, mass effect, extra-axial fluid collection, hydrocephalus or mass lesion. The skull is unremarkable. IMPRESSION: Stable appearance of the brain by head CT with old left-sided hemispheric infarct present as well as multiple old bilateral lacunar infarcts. No acute findings. Electronically Signed   By: Aletta Edouard M.D.   On: 10/12/2015 14:42   Mr Brain Wo Contrast (neuro Protocol)  10/12/2015  CLINICAL DATA:  New onset weakness yesterday. Left upper extremity weakness. EXAM: MRI HEAD WITHOUT CONTRAST TECHNIQUE: Multiplanar, multiecho pulse sequences of the brain and surrounding structures were obtained without intravenous contrast. COMPARISON:  CT head without contrast 10/12/2015. MRI brain 12/09/2011. FINDINGS: An acute nonhemorrhagic 11 mm linear infarct is present in the right centrum semiovale. No other acute infarct is present. A large remote left MCA infarct is noted. Separate from the infarct territories, periventricular T2 changes have progressed bilaterally. Multiple  remote lacunar infarcts are again noted in the right basal ganglia. New left-sided basal ganglia lacunar infarcts are present since the last MRI. These are not acute. There is ex vacuo dilation of the left lateral ventricle. White matter changes extend into the brainstem. Remote lacunar infarcts in the cerebellum bilaterally are stable. PE internal auditory canals are normal bilaterally. Flow is present in the major intracranial arteries. Globes and orbits are intact. The paranasal sinuses and mastoid air cells are clear. IMPRESSION: 1. 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 2. Expected progression describes that expected evolution of large left MCA territory nonhemorrhagic infarct. 3. Progressive remote lacunar infarcts in the basal ganglia on the left. 4. Stable remote lacunar infarcts in the basal ganglia on the right. 5. Progressive periventricular white matter changes bilaterally. These results were called by telephone at the time of interpretation on 10/12/2015 at 4:47 pm to Dr. Jeneen Rinks, who verbally acknowledged these results. Electronically Signed   By: San Morelle M.D.   On: 10/12/2015 16:48   Mr Jodene Nam Head/brain Wo Cm  10/12/2015  CLINICAL DATA:  New onset of left-sided weakness. Acute right periventricular white matter infarct. EXAM: MRA HEAD WITHOUT CONTRAST TECHNIQUE: Angiographic images of the Circle of Willis were obtained using MRA technique without intravenous contrast. COMPARISON:  MRI brain from the same day. FINDINGS: The internal carotid artery is are within normal limits shin high cervical segments through the ICA termini bilaterally. The A1 and M1 segments are normal. The MCA bifurcations are intact. There is mild attenuation of distal MCA branch vessels bilaterally. The vertebral arteries are codominant. The left PICA origin is visualized and normal. The right PICA origin is not visualized. The basilar artery is normal.  Both posterior cerebral arteries originate from the basilar tip. The PCA branch vessels are intact. IMPRESSION: 1. Mild medium and distal small vessel disease without significant proximal stenosis, aneurysm, or branch vessel occlusion. Electronically Signed   By: San Morelle M.D.   On: 10/12/2015 19:06       Medical Problem List and Plan: 1.  Left hemiplegia/dysarthria with gait deficits secondary to acute hemorrhagic white matter infarct right centrum  semi-as well as remote lacunar infarcts basal ganglia. 2.  DVT Prophylaxis/Anticoagulation: Subcutaneous Lovenox. Monitor platelet counts for any signs of bleeding 3. Pain Management: Tylenol as needed 4. Mood: Celexa 10 mg daily. Provide emotional support 5. Neuropsych: This patient is capable of making decisions on his own behalf. 6. Skin/Wound Care: Routine skin checks 7. Fluids/Electrolytes/Nutrition: Routine I&O with follow-up chemistries 8. Hypertension. No current antihypertensive medication. Patient on lisinopril 20 mg daily prior to admission. Resume as needed 9. Diabetes mellitus with peripheral neuropathy. Hemoglobin A1c 6.9. Glucophage 1000  milligrams twice a day. Check blood sugars before meals and at bedtime. Diabetic teaching 10. Residual spasticity RUE: Will educate on stretching techniques and splinting and consider medications if necessary  Post Admission Physician Evaluation: 1. Functional deficits secondary  to acute hemorrhagic white matter infarct right centrum semi-as well as remote lacunar infarcts basal ganglia. 2. Patient is admitted to receive collaborative, interdisciplinary care between the physiatrist, rehab nursing staff, and therapy team. 3. Patient's level of medical complexity and substantial therapy needs in context of that medical necessity cannot be provided at a lesser intensity of care such as a SNF. 4. Patient has experienced substantial functional loss from his/her baseline which was documented  above under the "Functional History" and "Functional Status" headings.  Judging by the patient's diagnosis, physical exam, and functional history, the patient has potential for functional progress which will result in measurable gains while on inpatient rehab.  These gains will be of substantial and practical use upon discharge  in facilitating mobility and self-care at the household level. 5. Physiatrist will provide 24 hour management of medical needs as well as oversight of the therapy plan/treatment and provide guidance as appropriate regarding the interaction of the two. 6. 24 hour rehab nursing will assist with safety, disease management, medication administration and patient education and help integrate therapy concepts, techniques,education, etc. 7. PT will assess and treat for/with: Lower extremity strength, range of motion, stamina, balance, functional mobility, safety, adaptive techniques and equipment, woundcare, coping skills, pain control, stroke education, spasticity management.   Goals are: Min A. 8. OT will assess and treat for/with: ADL's, functional mobility, safety, upper extremity strength, adaptive techniques and equipment, wound mgt, ego support, and community reintegration.   Goals are: Supervision/Min A. Therapy may proceed with showering this patient. 9. Case Management and Social Worker will assess and treat for psychological issues and discharge planning. 10. Team conference will be held weekly to assess progress toward goals and to determine barriers to discharge. 11. Patient will receive at least 3 hours of therapy per day at least 5 days per week. 12. ELOS: 16-19 days.       13. Prognosis:  good and fair   Delice Lesch, MD 10/14/2015

## 2015-10-14 NOTE — H&P (View-Only) (Signed)
Physical Medicine and Rehabilitation Admission H&P    Chief Complaint  Patient presents with  . Cerebrovascular Accident  : HPI: Darrell [name] is a 69 y.o. 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 Patient lives with spouse to level townhome 2 step to entry bedroom on first floor. Patient used a cane prior to admission. Patient required help with ADLs at baseline. Wife can assist as needed at discharge. Presented 10/12/2015 with left-sided weakness and slurred speech. MRI of the brain showed acute nonhemorrhagic white matter infarct in the right centrum semi-as well as progressive remote lacunar infarcts in the basal ganglia on the left stable remote lacunar infarcts basal ganglia on the right. Patient did not receive TPA. MRA of the head mild medium and distal small vessel disease without significant stenosis or occlusion. Echocardiogram with ejection fraction of 02% grade 1 diastolic dysfunction. Carotid Dopplers with no evidence of ICA stenosis. Neurology follow-up maintain on aspirin for CVA prophylaxis subcutaneous Lovenox for DVT prophylaxis. Tolerating a regular consistency diet. Physical therapy evaluation completed 10/13/2015 with recommendations of physical medicine rehabilitation consult. Patient was admitted for a comprehensive rehabilitation program  ROS Constitutional: Negative for fever and chills.  HENT: Negative for hearing loss.  Eyes: Negative for blurred vision and double vision.  Respiratory: Negative for cough and shortness of breath.  Cardiovascular: Negative for chest pain, palpitations and leg swelling.  Gastrointestinal: Positive for constipation. Negative for nausea and vomiting.  Genitourinary: Negative for dysuria and hematuria.    Musculoskeletal: Positive for joint pain.  Skin: Negative for rash.  Neurological: Positive for focal weakness (Acute left sided weakness with history of right-sided weakness residual after CVA). Negative for seizures, loss of consciousness and headaches.  Psychiatric/Behavioral: Positive for depression.  All other systems reviewed and are negative   Past Medical History  Diagnosis Date  . Diabetes mellitus   . Hypertension     dr Sherren Mocha  . Sleep apnea     cpap      sleep study > 4 yrs; no longer wearing CPAP  . Stroke [facility])     rt side weakness, 2013   Past Surgical History  Procedure Laterality Date  . Heel spur surgery    . Cole surgery    . Endarterectomy  12/09/2011    Procedure: ENDARTERECTOMY CAROTID;  Surgeon: Tinnie Gens, MD;  Location: [facility] OR;  Service: Vascular;  Laterality: Left;  Left Carotid endarterectomy with dacron patch angioplasty  . Carotid endarterectomy  12/09/11    Left  . Hernia repair      x2  . Ankle surgery    . Inguinal hernia repair Left 01/22/2014    Procedure: LEFT INGUINAL HERNIA REPAIR WITH MESH;  Surgeon: [name], MD;  Location: [place];  Service: General;  Laterality: Left;  . Insertion of mesh Left 01/22/2014    Procedure: INSERTION OF MESH;  Surgeon: [name], MD;  Location: [place];  Service: General;  Laterality: Left;  . Colonoscopy     Family History  Problem Relation Age of Onset  . Heart disease Father   . Colon cancer Father     at age 25  . Esophageal cancer Neg Hx   . Rectal cancer Neg Hx   . Stomach cancer Neg Hx    Social History:  reports that he  quit smoking about 13 years ago. His smoking use included Cigarettes. He has a 75 pack-year smoking history. He has never used smokeless tobacco. He reports that he does not drink alcohol or use illicit drugs. Allergies:  Allergies  Allergen Reactions  . Oxycodone-Acetaminophen     REACTION: feels sick (patient reports taking acetaminophen without issue)    Medications  Prior to Admission  Medication Sig Dispense Refill  . citalopram (CELEXA) 10 MG tablet Take 1 tablet (10 mg total) by mouth daily. 100 tablet 3  . lisinopril (PRINIVIL,ZESTRIL) 20 MG tablet Take 1 tablet (20 mg total) by mouth daily. 90 tablet 3  . metFORMIN (GLUCOPHAGE) 1000 MG tablet take 1 tablet by mouth twice a day with meals 200 tablet 3  . ONE TOUCH ULTRA TEST test strip TEST once daily (Patient not taking: Reported on 10/12/2015) 100 each PRN    Home: Home Living Family/patient expects to be discharged to:: Private residence Living Arrangements: Spouse/significant other Available Help at Discharge: Family, Available 24 hours/day Type of Home: Other(Comment) (Townhome) Home Access: Stairs to enter [employer] of Steps: 1 Entrance Stairs-Rails: Right, Left, Can reach both Home Layout: Two level, Able to live on main level with bedroom/bathroom Bathroom Shower/Tub: Multimedia programmer: Handicapped height Bathroom Accessibility: Yes Home Equipment: Shower seat, Grab bars - tub/shower, Cane - single point, Toilet riser   Functional History: Prior Function Level of Independence: Independent with assistive device(s) Comments: Used cane occasionally but was independent with ADL's. (?loftstrand crutch)  Functional Status:  Mobility: Bed Mobility Overal bed mobility: Needs Assistance Bed Mobility: Rolling, Sidelying to Sit, Supine to Sit Rolling: Supervision Sidelying to sit: Min assist Supine to sit: Min assist General bed mobility comments: Pt attempted to transition to EOB x2 different ways and required min assist with log roll and supine>sit to elevate trunk to full sitting position.  Transfers Overall transfer level: Needs assistance Equipment used: Rolling walker (2 wheeled) Transfers: Sit to/from Stand Sit to Stand: Min assist Stand pivot transfers: Mod assist General transfer comment: Therapist assist to position R hand on walker and maintain  positioning. Pt wanted to try RW for support.  Ambulation/Gait Ambulation/Gait assistance: Mod assist, Max assist Ambulation Distance (Feet): 100 Feet Assistive device: Rolling walker (2 wheeled), Straight cane Gait Pattern/deviations: Step-through pattern, Decreased stride length, Trunk flexed, Ataxic, Staggering left General Gait Details: Therapist held R hand on walker and weighted the R side ot maintain 4 legs of walker on the ground. Mod to max assist at times to maintain balance and provide support during ambulation. SPC was provided to simulate home environment per pt request, and max assist was required to maintain balance. RW was again provided and therapist assisted pt back to the room with a weighted R side of walker.  Gait velocity: Decreased Gait velocity interpretation: Below normal speed for age/gender    ADL: ADL Overall ADL's : Needs assistance/impaired Eating/Feeding: Minimal assistance Eating/Feeding Details (indicate cue type and reason): worked with pt during lunch. Using plate guard, adapted built up spoone (curved) and "sippy cup" with lid. Educated pt on using R hand as funcitonal assist to bujmpcup into L hand. wife presnt for education and need to properly set pt up with AE and placement of foods. Recommending taking foods out of small narromw dessert containers and placing these on pt's plate. Pt doing well with finger foods. REccomended to cut up sandwich into smaller bites to use as finger foods. Pt did well with this.  Grooming: Maximal assistance  Upper Body Bathing: Maximal assistance Lower Body Bathing: Maximal assistance Upper Body Dressing : Maximal assistance Lower Body Dressing: Maximal assistance Toilet Transfer: Moderate assistance, Ambulation Toileting- Clothing Manipulation and Hygiene: Maximal assistance Functional mobility during ADLs: Moderate assistance, Cueing for safety, Cueing for sequencing (L bias)  Cognition: Cognition Overall Cognitive  Status: History of cognitive impairments - at baseline Orientation Level: Oriented X4 Cognition Arousal/Alertness: Awake/alert Behavior During Therapy: WFL for tasks assessed/performed Overall Cognitive Status: History of cognitive impairments - at baseline Memory: Decreased short-term memory  Physical Exam: Blood pressure 145/84, pulse 71, temperature 97.8 F (36.6 C), temperature source Oral, resp. rate 18, height _0  (1.778 m), weight 93.441 kg (206 lb), SpO2 90 %. Physical Exam Constitutional: He is oriented to person, place, and time. He appears well-developed and well-nourished.  HENT:  Head: Normocephalic and atraumatic.  Eyes: Conjunctivae and EOM are normal.  Neck: Normal range of motion. Neck supple. No thyromegaly present.  Cardiovascular: Normal rate and regular rhythm.  Respiratory: Effort normal and breath sounds normal. No respiratory distress.  GI: Soft. Bowel sounds are normal. He exhibits no distension.  Musculoskeletal: He exhibits no edema or tenderness.  Neurological: He is alert and oriented to person, place, and time.  Follows commands 3+ DTRs throughout Sensation intact to light touch Motor: RUE: 4+/5 proximal to distal, flexor spasticity LUE: Shoulder abduction, elbow flexion and extension 3+/5, hand grip 3 -/5 B/L LE: 5/5  Skin: Skin is warm and dry.  Psychiatric: He has a normal mood and affect. His behavior is normal   Results for orders placed or performed during the hospital encounter of 10/12/15 (from the past 48 hour(s))  CBG monitoring, ED     Status: Abnormal   Collection Time: 10/12/15  1:24 PM  Result Value Ref Range   Glucose-Capillary 111 (H) 65 - 99 mg/dL  Urine rapid drug screen (hosp performed)not at [facility]     Status: None   Collection Time: 10/12/15  2:52 PM  Result Value Ref Range   Opiates NONE DETECTED NONE DETECTED   Cocaine NONE DETECTED NONE DETECTED   Benzodiazepines NONE DETECTED NONE DETECTED   Amphetamines NONE  DETECTED NONE DETECTED   Tetrahydrocannabinol NONE DETECTED NONE DETECTED   Barbiturates NONE DETECTED NONE DETECTED    Comment:        DRUG SCREEN FOR MEDICAL PURPOSES ONLY.  IF CONFIRMATION IS NEEDED FOR ANY PURPOSE, NOTIFY LAB WITHIN 5 DAYS.        LOWEST DETECTABLE LIMITS FOR URINE DRUG SCREEN Drug Class       Cutoff (ng/mL) Amphetamine      1000 Barbiturate      200 Benzodiazepine   195 Tricyclics       093 Opiates          300 Cocaine          300 THC              50   Urinalysis, Routine w reflex microscopic (not at [facility])     Status: Abnormal   Collection Time: 10/12/15  2:52 PM  Result Value Ref Range   Color, Urine AMBER (A) YELLOW    Comment: BIOCHEMICALS MAY BE AFFECTED BY COLOR   APPearance CLEAR CLEAR   Specific Gravity, Urine 1.028 1.005 - 1.030   pH 5.5 5.0 - 8.0   Glucose, UA NEGATIVE NEGATIVE mg/dL   Hgb urine dipstick NEGATIVE NEGATIVE   Bilirubin Urine SMALL (A) NEGATIVE   Ketones, ur 15 (A) NEGATIVE mg/dL  Protein, ur NEGATIVE NEGATIVE mg/dL   Nitrite NEGATIVE NEGATIVE   Leukocytes, UA TRACE (A) NEGATIVE  Urine microscopic-add on     Status: Abnormal   Collection Time: 10/12/15  2:52 PM  Result Value Ref Range   Squamous Epithelial / LPF 0-5 (A) NONE SEEN   WBC, UA 0-5 0 - 5 WBC/hpf   RBC / HPF 0-5 0 - 5 RBC/hpf   Bacteria, UA NONE SEEN NONE SEEN   Urine-Other MUCOUS PRESENT   Glucose, capillary     Status: Abnormal   Collection Time: 10/12/15  9:05 PM  Result Value Ref Range   Glucose-Capillary 195 (H) 65 - 99 mg/dL   Comment 1 Notify RN    Comment 2 Document in Chart   Magnesium     Status: None   Collection Time: 10/13/15  1:08 AM  Result Value Ref Range   Magnesium 2.1 1.7 - 2.4 mg/dL  Hemoglobin A1c     Status: Abnormal   Collection Time: 10/13/15  2:58 AM  Result Value Ref Range   Hgb A1c MFr Bld 6.9 (H) 4.8 - 5.6 %    Comment: (NOTE)         Pre-diabetes: 5.7 - 6.4         Diabetes: >6.4         Glycemic control for adults with  diabetes: <7.0    Mean Plasma Glucose 151 mg/dL    Comment: (NOTE) Performed At: [facility] [place], [place] [id number] Lindon Romp MD [id number]   CBC     Status: None   Collection Time: 10/13/15  2:58 AM  Result Value Ref Range   WBC 8.6 4.0 - 10.5 K/uL   RBC 5.01 4.22 - 5.81 MIL/uL   Hemoglobin 15.0 13.0 - 17.0 g/dL   HCT 44.4 39.0 - 52.0 %   MCV 88.6 78.0 - 100.0 fL   MCH 29.9 26.0 - 34.0 pg   MCHC 33.8 30.0 - 36.0 g/dL   RDW 13.5 11.5 - 15.5 %   Platelets 161 150 - 400 K/uL  Basic metabolic panel     Status: Abnormal   Collection Time: 10/13/15  2:58 AM  Result Value Ref Range   Sodium 137 135 - 145 mmol/L   Potassium 3.2 (L) 3.5 - 5.1 mmol/L   Chloride 105 101 - 111 mmol/L   CO2 24 22 - 32 mmol/L   Glucose, Bld 127 (H) 65 - 99 mg/dL   BUN 10 6 - 20 mg/dL   Creatinine, Ser 0.69 0.61 - 1.24 mg/dL   Calcium 8.7 (L) 8.9 - 10.3 mg/dL   GFR calc non Af Amer >60 >60 mL/min   GFR calc Af Amer >60 >60 mL/min    Comment: (NOTE) The eGFR has been calculated using the CKD EPI equation. This calculation has not been validated in all clinical situations. eGFR's persistently <60 mL/min signify possible Chronic Kidney Disease.    Anion gap 8 5 - 15  Lipid panel     Status: Abnormal   Collection Time: 10/13/15  2:58 AM  Result Value Ref Range   Cholesterol 76 0 - 200 mg/dL   Triglycerides 96 <150 mg/dL   HDL 17 (L) >40 mg/dL   Total CHOL/HDL Ratio 4.5 RATIO   VLDL 19 0 - 40 mg/dL   LDL Cholesterol 40 0 - 99 mg/dL    Comment:        Total Cholesterol/HDL:CHD Risk Coronary Heart Disease Risk Table  Men   Women  1/2 Average Risk   3.4   3.3  Average Risk       5.0   4.4  2 X Average Risk   9.6   7.1  3 X Average Risk  23.4   11.0        Use the calculated Patient Ratio above and the CHD Risk Table to determine the patient's CHD Risk.        ATP III CLASSIFICATION (LDL):  <100     mg/dL   Optimal  100-129  mg/dL    Near or Above                    Optimal  130-159  mg/dL   Borderline  160-189  mg/dL   High  >190     mg/dL   Very High   Glucose, capillary     Status: Abnormal   Collection Time: 10/13/15  6:25 AM  Result Value Ref Range   Glucose-Capillary 124 (H) 65 - 99 mg/dL   Comment 1 Notify RN    Comment 2 Document in Chart   Glucose, capillary     Status: Abnormal   Collection Time: 10/13/15 11:36 AM  Result Value Ref Range   Glucose-Capillary 141 (H) 65 - 99 mg/dL  Glucose, capillary     Status: Abnormal   Collection Time: 10/13/15  4:21 PM  Result Value Ref Range   Glucose-Capillary 116 (H) 65 - 99 mg/dL  Glucose, capillary     Status: Abnormal   Collection Time: 10/13/15  9:18 PM  Result Value Ref Range   Glucose-Capillary 125 (H) 65 - 99 mg/dL   Comment 1 Notify RN    Comment 2 Document in Chart   Glucose, capillary     Status: Abnormal   Collection Time: 10/14/15  6:35 AM  Result Value Ref Range   Glucose-Capillary 122 (H) 65 - 99 mg/dL   Comment 1 Notify RN    Comment 2 Document in Chart   Basic metabolic panel     Status: Abnormal   Collection Time: 10/14/15  6:55 AM  Result Value Ref Range   Sodium 137 135 - 145 mmol/L   Potassium 3.7 3.5 - 5.1 mmol/L   Chloride 103 101 - 111 mmol/L   CO2 26 22 - 32 mmol/L   Glucose, Bld 143 (H) 65 - 99 mg/dL   BUN 10 6 - 20 mg/dL   Creatinine, Ser 0.69 0.61 - 1.24 mg/dL   Calcium 8.5 (L) 8.9 - 10.3 mg/dL   GFR calc non Af Amer >60 >60 mL/min   GFR calc Af Amer >60 >60 mL/min    Comment: (NOTE) The eGFR has been calculated using the CKD EPI equation. This calculation has not been validated in all clinical situations. eGFR's persistently <60 mL/min signify possible Chronic Kidney Disease.    Anion gap 8 5 - 15   Dg Chest 2 View  10/12/2015  CLINICAL DATA:  Patient with weakness for 1 day. EXAM: CHEST  2 VIEW COMPARISON:  Chest radiograph 10/13/2013. FINDINGS: Stable enlarged cardiac and mediastinal contours. Pulmonary vascular  redistribution. No large area of pulmonary consolidation. No pleural effusion or pneumothorax. Probable healing posterior right eighth and ninth rib fractures. Thoracic spine degenerative changes. IMPRESSION: Cardiomegaly with pulmonary vascular redistribution. Probable healing posterior right rib fractures. Electronically Signed   By: Lovey Newcomer M.D.   On: 10/12/2015 13:19   Ct Head Wo Contrast  10/12/2015  CLINICAL  DATA:  Left-sided weakness.  History of stroke. EXAM: CT HEAD WITHOUT CONTRAST TECHNIQUE: Contiguous axial images were obtained from the base of the skull through the vertex without intravenous contrast. COMPARISON:  10/12/2013 FINDINGS: Large area of chronic encephalomalacia related to prior stroke involving the left frontal and parietal lobes appears stable. Several old lacunar infarcts bilaterally or also stable involving the basal ganglia. The brain demonstrates no evidence of hemorrhage, acute infarction, edema, mass effect, extra-axial fluid collection, hydrocephalus or mass lesion. The skull is unremarkable. IMPRESSION: Stable appearance of the brain by head CT with old left-sided hemispheric infarct present as well as multiple old bilateral lacunar infarcts. No acute findings. Electronically Signed   By: Aletta Edouard M.D.   On: 10/12/2015 14:42   Mr Brain Wo Contrast (neuro Protocol)  10/12/2015  CLINICAL DATA:  New onset weakness yesterday. Left upper extremity weakness. EXAM: MRI HEAD WITHOUT CONTRAST TECHNIQUE: Multiplanar, multiecho pulse sequences of the brain and surrounding structures were obtained without intravenous contrast. COMPARISON:  CT head without contrast 10/12/2015. MRI brain 12/09/2011. FINDINGS: An acute nonhemorrhagic 11 mm linear infarct is present in the right centrum semiovale. No other acute infarct is present. A large remote left MCA infarct is noted. Separate from the infarct territories, periventricular T2 changes have progressed bilaterally. Multiple  remote lacunar infarcts are again noted in the right basal ganglia. New left-sided basal ganglia lacunar infarcts are present since the last MRI. These are not acute. There is ex vacuo dilation of the left lateral ventricle. White matter changes extend into the brainstem. Remote lacunar infarcts in the cerebellum bilaterally are stable. PE internal auditory canals are normal bilaterally. Flow is present in the major intracranial arteries. Globes and orbits are intact. The paranasal sinuses and mastoid air cells are clear. IMPRESSION: 1. 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 2. Expected progression describes that expected evolution of large left MCA territory nonhemorrhagic infarct. 3. Progressive remote lacunar infarcts in the basal ganglia on the left. 4. Stable remote lacunar infarcts in the basal ganglia on the right. 5. Progressive periventricular white matter changes bilaterally. These results were called by telephone at the time of interpretation on 10/12/2015 at 4:47 pm to Dr. Jeneen Rinks, who verbally acknowledged these results. Electronically Signed   By: San Morelle M.D.   On: 10/12/2015 16:48   Mr Jodene Nam Head/brain Wo Cm  10/12/2015  CLINICAL DATA:  New onset of left-sided weakness. Acute right periventricular white matter infarct. EXAM: MRA HEAD WITHOUT CONTRAST TECHNIQUE: Angiographic images of the Circle of Willis were obtained using MRA technique without intravenous contrast. COMPARISON:  MRI brain from the same day. FINDINGS: The internal carotid artery is are within normal limits shin high cervical segments through the ICA termini bilaterally. The A1 and M1 segments are normal. The MCA bifurcations are intact. There is mild attenuation of distal MCA branch vessels bilaterally. The vertebral arteries are codominant. The left PICA origin is visualized and normal. The right PICA origin is not visualized. The basilar artery is normal.  Both posterior cerebral arteries originate from the basilar tip. The PCA branch vessels are intact. IMPRESSION: 1. Mild medium and distal small vessel disease without significant proximal stenosis, aneurysm, or branch vessel occlusion. Electronically Signed   By: San Morelle M.D.   On: 10/12/2015 19:06       Medical Problem List and Plan: 1.  Left hemiplegia/dysarthria with gait deficits secondary to acute hemorrhagic white matter infarct right centrum  semi-as well as remote lacunar infarcts basal ganglia. 2.  DVT Prophylaxis/Anticoagulation: Subcutaneous Lovenox. Monitor platelet counts for any signs of bleeding 3. Pain Management: Tylenol as needed 4. Mood: Celexa 10 mg daily. Provide emotional support 5. Neuropsych: This patient is capable of making decisions on his own behalf. 6. Skin/Wound Care: Routine skin checks 7. Fluids/Electrolytes/Nutrition: Routine I&O with follow-up chemistries 8. Hypertension. No current antihypertensive medication. Patient on lisinopril 20 mg daily prior to admission. Resume as needed 9. Diabetes mellitus with peripheral neuropathy. Hemoglobin A1c 6.9. Glucophage 1000  milligrams twice a day. Check blood sugars before meals and at bedtime. Diabetic teaching 10. Residual spasticity RUE: Will educate on stretching techniques and splinting and consider medications if necessary  Post Admission Physician Evaluation: 1. Functional deficits secondary  to acute hemorrhagic white matter infarct right centrum semi-as well as remote lacunar infarcts basal ganglia. 2. Patient is admitted to receive collaborative, interdisciplinary care between the physiatrist, rehab nursing staff, and therapy team. 3. Patient's level of medical complexity and substantial therapy needs in context of that medical necessity cannot be provided at a lesser intensity of care such as a SNF. 4. Patient has experienced substantial functional loss from his/her baseline which was documented  above under the "Functional History" and "Functional Status" headings.  Judging by the patient's diagnosis, physical exam, and functional history, the patient has potential for functional progress which will result in measurable gains while on inpatient rehab.  These gains will be of substantial and practical use upon discharge  in facilitating mobility and self-care at the household level. 5. Physiatrist will provide 24 hour management of medical needs as well as oversight of the therapy plan/treatment and provide guidance as appropriate regarding the interaction of the two. 6. 24 hour rehab nursing will assist with safety, disease management, medication administration and patient education and help integrate therapy concepts, techniques,education, etc. 7. PT will assess and treat for/with: Lower extremity strength, range of motion, stamina, balance, functional mobility, safety, adaptive techniques and equipment, woundcare, coping skills, pain control, stroke education, spasticity management.   Goals are: Min A. 8. OT will assess and treat for/with: ADL's, functional mobility, safety, upper extremity strength, adaptive techniques and equipment, wound mgt, ego support, and community reintegration.   Goals are: Supervision/Min A. Therapy may proceed with showering this patient. 9. Case Management and Social Worker will assess and treat for psychological issues and discharge planning. 10. Team conference will be held weekly to assess progress toward goals and to determine barriers to discharge. 11. Patient will receive at least 3 hours of therapy per day at least 5 days per week. 12. ELOS: 16-19 days.       13. Prognosis:  good and fair   Delice Lesch, MD 10/14/2015

## 2015-10-14 NOTE — Progress Notes (Signed)
Preliminary results by tech - Carotid Duplex Completed. Mild plaque visualized in both carotid arteries with no evidence of stenosis noted. Left CCA/ICA BPG is patent with no evidence of stenosis.  Bilateral vertebral arteries are patent with antegrade flow. Oda Cogan, BS, RDMS, RVT

## 2015-10-14 NOTE — Consult Note (Signed)
Physical Medicine and Rehabilitation Consult Reason for Consult: Acute nonhemorrhagic white matter infarct in the right centrum semi, remote lacunar infarcts basal ganglia on the left and right. Referring Physician: [facility]   HPI: Darrell [name] is a 69 y.o. 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 Patient lives with spouse to level townhome 2 step to entry bedroom on first floor. Patient used a cane prior to admission. Patient required help with ADLs at baseline. Wife can assist as needed at discharge. Presented 10/12/2015 with left-sided weakness and slurred speech. MRI of the brain showed acute nonhemorrhagic white matter infarct in the right centrum semi-as well as progressive remote lacunar infarcts in the basal ganglia on the left stable remote lacunar infarcts basal ganglia on the right. Patient did not receive TPA. MRA of the head mild medium and distal small vessel disease without significant stenosis or occlusion. Echocardiogram with ejection fraction of [id number] grade 1 diastolic dysfunction. Carotid Dopplers with no evidence of ICA stenosis. Neurology follow-up maintain on aspirin for CVA prophylaxis subcutaneous Lovenox for DVT prophylaxis. Tolerating a regular consistency diet. Physical therapy evaluation completed 10/13/2015 with recommendations of physical medicine rehabilitation consult.  Review of Systems  Constitutional: Negative for fever and chills.  HENT: Negative for hearing loss.   Eyes: Negative for blurred vision and double vision.  Respiratory: Negative for cough and shortness of breath.   Cardiovascular: Negative for chest pain, palpitations and leg swelling.  Gastrointestinal: Positive for constipation. Negative for nausea and vomiting.  Genitourinary: Negative  for dysuria and hematuria.  Musculoskeletal: Positive for joint pain.  Skin: Negative for rash.  Neurological: Positive for focal weakness (Acute left sided weakness with history of right-sided weakness residual after CVA). Negative for seizures, loss of consciousness and headaches.  Psychiatric/Behavioral: Positive for depression.  All other systems reviewed and are negative.  Past Medical History  Diagnosis Date  . Diabetes mellitus   . Hypertension     dr Sherren Mocha  . Sleep apnea     cpap      sleep study > 4 yrs; no longer wearing CPAP  . Stroke [facility])     rt side weakness, 2013   Past Surgical History  Procedure Laterality Date  . Heel spur surgery    . [facility] surgery    . Endarterectomy  12/09/2011    Procedure: ENDARTERECTOMY CAROTID;  Surgeon: Tinnie Gens, MD;  Location: [facility] OR;  Service: Vascular;  Laterality: Left;  Left Carotid endarterectomy with dacron patch angioplasty  . Carotid endarterectomy  12/09/11    Left  . Hernia repair      x2  . Ankle surgery    . Inguinal hernia repair Left 01/22/2014    Procedure: LEFT INGUINAL HERNIA REPAIR WITH MESH;  Surgeon: [name], MD;  Location: [place];  Service: General;  Laterality: Left;  . Insertion of mesh Left 01/22/2014    Procedure: INSERTION OF MESH;  Surgeon: [name], MD;  Location: [employer];  Service: General;  Laterality: Left;  . Colonoscopy     Family History  Problem Relation Age of Onset  . Heart disease Father   . Colon cancer Father     at age 81  . Esophageal cancer Neg Hx   . Rectal cancer Neg Hx   . Stomach cancer Neg Hx  Social History:  reports that he quit smoking about 13 years ago. His smoking use included Cigarettes. He has a 75 pack-year smoking history. He has never used smokeless tobacco. He reports that he does not drink alcohol or use illicit drugs. Allergies:  Allergies  Allergen Reactions  . Oxycodone-Acetaminophen     REACTION: feels sick (patient reports taking acetaminophen without  issue)    Medications Prior to Admission  Medication Sig Dispense Refill  . citalopram (CELEXA) 10 MG tablet Take 1 tablet (10 mg total) by mouth daily. 100 tablet 3  . lisinopril (PRINIVIL,ZESTRIL) 20 MG tablet Take 1 tablet (20 mg total) by mouth daily. 90 tablet 3  . metFORMIN (GLUCOPHAGE) 1000 MG tablet take 1 tablet by mouth twice a day with meals 200 tablet 3  . ONE TOUCH ULTRA TEST test strip TEST once daily (Patient not taking: Reported on 10/12/2015) 100 each PRN    Home: Home Living Family/patient expects to be discharged to:: Private residence Living Arrangements: Spouse/significant other Available Help at Discharge: Family, Available 24 hours/day Type of Home: Other(Comment) (Townhome) Home Access: Stairs to enter [employer] of Steps: 1 Entrance Stairs-Rails: Right, Left, Can reach both Home Layout: Two level, Able to live on main level with bedroom/bathroom Bathroom Shower/Tub: Multimedia programmer: Handicapped height Bathroom Accessibility: Yes Home Equipment: Shower seat, Grab bars - tub/shower, Cane - single point, Toilet riser  Functional History: Prior Function Level of Independence: Independent with assistive device(s) Comments: Used cane occasionally but was independent with ADL's. (?loftstrand crutch) Functional Status:  Mobility: Bed Mobility Overal bed mobility: Needs Assistance Bed Mobility: Rolling, Sidelying to Sit, Supine to Sit Rolling: Supervision Sidelying to sit: Min assist Supine to sit: Min assist General bed mobility comments: Pt attempted to transition to EOB x2 different ways and required min assist with log roll and supine>sit to elevate trunk to full sitting position.  Transfers Overall transfer level: Needs assistance Equipment used: Rolling walker (2 wheeled) Transfers: Sit to/from Stand Sit to Stand: Min assist Stand pivot transfers: Mod assist General transfer comment: Therapist assist to position R hand on  walker and maintain positioning. Pt wanted to try RW for support.  Ambulation/Gait Ambulation/Gait assistance: Mod assist, Max assist Ambulation Distance (Feet): 100 Feet Assistive device: Rolling walker (2 wheeled), Straight cane Gait Pattern/deviations: Step-through pattern, Decreased stride length, Trunk flexed, Ataxic, Staggering left General Gait Details: Therapist held R hand on walker and weighted the R side ot maintain 4 legs of walker on the ground. Mod to max assist at times to maintain balance and provide support during ambulation. SPC was provided to simulate home environment per pt request, and max assist was required to maintain balance. RW was again provided and therapist assisted pt back to the room with a weighted R side of walker.  Gait velocity: Decreased Gait velocity interpretation: Below normal speed for age/gender    ADL: ADL Overall ADL's : Needs assistance/impaired Eating/Feeding: Minimal assistance Eating/Feeding Details (indicate cue type and reason): worked with pt during lunch. Using plate guard, adapted built up spoone (curved) and "sippy cup" with lid. Educated pt on using R hand as funcitonal assist to bujmpcup into L hand. wife presnt for education and need to properly set pt up with AE and placement of foods. Recommending taking foods out of small narromw dessert containers and placing these on pt's plate. Pt doing well with finger foods. REccomended to cut up sandwich into smaller bites to use as finger foods. Pt did well with this.  Grooming: Maximal assistance Upper Body Bathing: Maximal assistance Lower Body Bathing: Maximal assistance Upper Body Dressing : Maximal assistance Lower Body Dressing: Maximal assistance Toilet Transfer: Moderate assistance, Ambulation Toileting- Clothing Manipulation and Hygiene: Maximal assistance Functional mobility during ADLs: Moderate assistance, Cueing for safety, Cueing for sequencing (L  bias)  Cognition: Cognition Overall Cognitive Status: History of cognitive impairments - at baseline Orientation Level: Oriented X4 Cognition Arousal/Alertness: Awake/alert Behavior During Therapy: WFL for tasks assessed/performed Overall Cognitive Status: History of cognitive impairments - at baseline Memory: Decreased short-term memory  Blood pressure 145/84, pulse 71, temperature 97.8 F (36.6 C), temperature source Oral, resp. rate 18, height 5\' 10"  (1.778 m), weight 93.441 kg (206 lb), SpO2 90 %. Physical Exam  Vitals reviewed. Constitutional: He is oriented to person, place, and time. He appears well-developed and well-nourished.  HENT:  Head: Normocephalic and atraumatic.  Eyes: Conjunctivae and EOM are normal.  Neck: Normal range of motion. Neck supple. No thyromegaly present.  Cardiovascular: Normal rate and regular rhythm.   Respiratory: Effort normal and breath sounds normal. No respiratory distress.  GI: Soft. Bowel sounds are normal. He exhibits no distension.  Musculoskeletal: He exhibits no edema or tenderness.  Neurological: He is alert and oriented to person, place, and time.  Follows commands 3+ DTRs throughout Sensation intact to light touch Motor: RUE: 4+/5 proximal to distal, flexor spasticity LUE: Shoulder abduction, elbow flexion and extension 3+/5, hand grip 3 -/5 B/L LE: 5/5  Skin: Skin is warm and dry.  Psychiatric: He has a normal mood and affect. His behavior is normal.    Results for orders placed or performed during the hospital encounter of 10/12/15 (from the past 24 hour(s))  Glucose, capillary     Status: Abnormal   Collection Time: 10/13/15  6:25 AM  Result Value Ref Range   Glucose-Capillary 124 (H) 65 - 99 mg/dL   Comment 1 Notify RN    Comment 2 Document in Chart   Glucose, capillary     Status: Abnormal   Collection Time: 10/13/15 11:36 AM  Result Value Ref Range   Glucose-Capillary 141 (H) 65 - 99 mg/dL  Glucose, capillary      Status: Abnormal   Collection Time: 10/13/15  4:21 PM  Result Value Ref Range   Glucose-Capillary 116 (H) 65 - 99 mg/dL  Glucose, capillary     Status: Abnormal   Collection Time: 10/13/15  9:18 PM  Result Value Ref Range   Glucose-Capillary 125 (H) 65 - 99 mg/dL   Comment 1 Notify RN    Comment 2 Document in Chart    Dg Chest 2 View  10/12/2015  CLINICAL DATA:  Patient with weakness for 1 day. EXAM: CHEST  2 VIEW COMPARISON:  Chest radiograph 10/13/2013. FINDINGS: Stable enlarged cardiac and mediastinal contours. Pulmonary vascular redistribution. No large area of pulmonary consolidation. No pleural effusion or pneumothorax. Probable healing posterior right eighth and ninth rib fractures. Thoracic spine degenerative changes. IMPRESSION: Cardiomegaly with pulmonary vascular redistribution. Probable healing posterior right rib fractures. Electronically Signed   By: Lovey Newcomer M.D.   On: 10/12/2015 13:19   Ct Head Wo Contrast  10/12/2015  CLINICAL DATA:  Left-sided weakness.  History of stroke. EXAM: CT HEAD WITHOUT CONTRAST TECHNIQUE: Contiguous axial images were obtained from the base of the skull through the vertex without intravenous contrast. COMPARISON:  10/12/2013 FINDINGS: Large area of chronic encephalomalacia related to prior stroke involving the left frontal and parietal lobes appears stable. Several old lacunar infarcts bilaterally  or also stable involving the basal ganglia. The brain demonstrates no evidence of hemorrhage, acute infarction, edema, mass effect, extra-axial fluid collection, hydrocephalus or mass lesion. The skull is unremarkable. IMPRESSION: Stable appearance of the brain by head CT with old left-sided hemispheric infarct present as well as multiple old bilateral lacunar infarcts. No acute findings. Electronically Signed   By: Aletta Edouard M.D.   On: 10/12/2015 14:42   Mr Brain Wo Contrast (neuro Protocol)  10/12/2015  CLINICAL DATA:  New onset weakness yesterday.  Left upper extremity weakness. EXAM: MRI HEAD WITHOUT CONTRAST TECHNIQUE: Multiplanar, multiecho pulse sequences of the brain and surrounding structures were obtained without intravenous contrast. COMPARISON:  CT head without contrast 10/12/2015. MRI brain 12/09/2011. FINDINGS: An acute nonhemorrhagic 11 mm linear infarct is present in the right centrum semiovale. No other acute infarct is present. A large remote left MCA infarct is noted. Separate from the infarct territories, periventricular T2 changes have progressed bilaterally. Multiple remote lacunar infarcts are again noted in the right basal ganglia. New left-sided basal ganglia lacunar infarcts are present since the last MRI. These are not acute. There is ex vacuo dilation of the left lateral ventricle. White matter changes extend into the brainstem. Remote lacunar infarcts in the cerebellum bilaterally are stable. PE internal auditory canals are normal bilaterally. Flow is present in the major intracranial arteries. Globes and orbits are intact. The paranasal sinuses and mastoid air cells are clear. IMPRESSION: 1. 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 2. Expected progression describes that expected evolution of large left MCA territory nonhemorrhagic infarct. 3. Progressive remote lacunar infarcts in the basal ganglia on the left. 4. Stable remote lacunar infarcts in the basal ganglia on the right. 5. Progressive periventricular white matter changes bilaterally. These results were called by telephone at the time of interpretation on 10/12/2015 at 4:47 pm to Dr. Jeneen Rinks, who verbally acknowledged these results. Electronically Signed   By: San Morelle M.D.   On: 10/12/2015 16:48   Mr Jodene Nam Head/brain Wo Cm  10/12/2015  CLINICAL DATA:  New onset of left-sided weakness. Acute right periventricular white matter infarct. EXAM: MRA HEAD WITHOUT CONTRAST TECHNIQUE: Angiographic images of the  Circle of Willis were obtained using MRA technique without intravenous contrast. COMPARISON:  MRI brain from the same day. FINDINGS: The internal carotid artery is are within normal limits shin high cervical segments through the ICA termini bilaterally. The A1 and M1 segments are normal. The MCA bifurcations are intact. There is mild attenuation of distal MCA branch vessels bilaterally. The vertebral arteries are codominant. The left PICA origin is visualized and normal. The right PICA origin is not visualized. The basilar artery is normal. Both posterior cerebral arteries originate from the basilar tip. The PCA branch vessels are intact. IMPRESSION: 1. Mild medium and distal small vessel disease without significant proximal stenosis, aneurysm, or branch vessel occlusion. Electronically Signed   By: San Morelle M.D.   On: 10/12/2015 19:06    Assessment/Plan: Diagnosis: Acute nonhemorrhagic white matter infarct in the right centrum semi Labs and images independently reviewed.  Records reviewed and summated above. Stroke: Continue secondary stroke prophylaxis and Risk Factor Modification listed below:   Antiplatelet therapy Blood Pressure Management:  Continue current medication with prn's with permisive HTN per primary team Statin Agent Diabetes management B/l hemiparesis: fit for orthotics to prevent contractures (resting hand splint for day, wrist cock up splint at night, PRAFO, etc)  Motor recovery: Fluoxetine  1. Does the need for close, 24 hr/day medical supervision in concert with the patient's rehab needs make it unreasonable for this patient to be served in a less intensive setting? Yes  2. Co-Morbidities requiring supervision/potential complications: HTN (monitor and provide prns in accordance with increased physical exertion and pain), diabetes mellitus and peripheral neuropathy (Monitor in accordance with exercise and adjust meds as necessary), left MCA/PCA watershed infarct 2013  with right-sided residual weakness and spasticity, OSA (monitor for signs and symptoms of daytime fatigue)  3. Due to safety, disease management, medication administration and patient education, does the patient require 24 hr/day rehab nursing? Yes 4. Does the patient require coordinated care of a physician, rehab nurse, PT (1-2 hrs/day, 5 days/week) and OT (1-2 hrs/day, 5 days/week) to address physical and functional deficits in the context of the above medical diagnosis(es)? Yes Addressing deficits in the following areas: balance, endurance, locomotion, strength, transferring, bathing, dressing, toileting and psychosocial support 5. Can the patient actively participate in an intensive therapy program of at least 3 hrs of therapy per day at least 5 days per week? Yes 6. The potential for patient to make measurable gains while on inpatient rehab is good 7. Anticipated functional outcomes upon discharge from inpatient rehab are min assist  with PT, supervision and min assist with OT, n/a with SLP. 8. Estimated rehab length of stay to reach the above functional goals is: 16-19 days. 9. Does the patient have adequate social supports and living environment to accommodate these discharge functional goals? Yes 10. Anticipated D/C setting: Home 11. Anticipated post D/C treatments: HH therapy and Home excercise program 12. Overall Rehab/Functional Prognosis: good and fair  RECOMMENDATIONS: This patient's condition is appropriate for continued rehabilitative care in the following setting: CIR Patient has agreed to participate in recommended program. Yes Note that insurance prior authorization may be required for reimbursement for recommended care.  Comment: Rehab Admissions Coordinator to follow up.  Delice Lesch, MD 10/14/2015

## 2015-10-14 NOTE — Interval H&P Note (Signed)
Darrell [name] was admitted today to Inpatient Rehabilitation with the diagnosis of acute hemorrhagic white matter infarct right centrum semi-as well as remote lacunar infarcts basal ganglia.  [name] history has been reviewed, patient examined, and there is no change in status.  Patient continues to be appropriate for intensive inpatient rehabilitation.  I have reviewed the patient's chart and labs.  Questions were answered to the patient's satisfaction. The PAPE has been reviewed and assessment remains appropriate.  Darrell [name] 10/14/2015, 4:10 PM

## 2015-10-14 NOTE — Discharge Summary (Signed)
Physician Discharge Summary  Darrell [name] [id number] DOB: [birth year] DOA: 10/12/2015  PCP: Joycelyn Man, MD  Admit date: 10/12/2015 Discharge date: 10/14/2015  Time spent: Greater than 30 minutes  Recommendations for Outpatient Follow-up:  1. [facility] 2. Dr. Stevie Kern, PCP upon discharge from CIR 3. Dr. Antony Contras, Neurology in 2 months.  Discharge Diagnoses:  Active Problems:   Diabetes 1.5, managed as type 2 (Darrell [name])   Essential hypertension   CVA (cerebral infarction)   Left-sided weakness   Abnormal CXR   Discharge Condition: Improved & Stable  Diet recommendation: Heart healthy and diabetic diet.  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 MRI brain confirmed acute stroke. Neurology consulted.   Hospital Course:   Acute right brain stroke/nondominant infarct secondary to small vessel disease - Resultant dysarthria and left hemiparesis. Dysarthria resolved. - MRI brain: Acute nonhemorrhagic white matter infarct in the right centrum semiovale measuring 11 mm maximally. - MRA brain: Mild medium in distal small vessel disease without significant proximal stenosis, aneurysm or branch vessel occlusion - Carotid Dopplers: No evidence of stenosis per preliminary report. - 2-D echo: LVEF 55-60 percent and grade 1 diastolic dysfunction. No cardiac source of emboli was identified. - LDL 40 - A1c: 6.9 - Not on aspirin for several years-had stopped taking due to bruising. Now on aspirin 325 MG daily - PT and OT recommend CIR  - Neurology follow-up appreciated   Type II DM, controlled   - Hemoglobin A1c 6.9. Continue metformin at discharge-was held in the hospital and was placed on SSI.  Essential hypertension  - Lisinopril held in the hospital to allow permissive hypertension. Resume lisinopril at discharge.  Hypokalemia - Replaced  DO NOT RESUSCITATE  Family Communication: discussed extensively with spouse at bedside on 12/19     Consultants:  Neurology  Rehabilitation M.D.  Procedures:  2-D echo 10/13/15: Study Conclusions  - Left ventricle: The cavity size was mildly dilated. There was mild concentric hypertrophy. Systolic function was normal. The estimated ejection fraction was in the range of 55% to 60%. Wall motion was normal; there were no regional wall motion abnormalities. Doppler parameters are consistent with abnormal left ventricular relaxation (grade 1 diastolic dysfunction). - Ascending aorta: The ascending aorta was mildly dilated measuring 41 mm. - Mitral valve: There was no regurgitation. - Left atrium: The atrium was normal in size. - Right ventricle: Systolic function was normal. - Right atrium: The atrium was normal in size. - Tricuspid valve: There was trivial regurgitation. - Pulmonic valve: There was no regurgitation. - Pulmonary arteries: Systolic pressure was within the normal range. - Inferior vena cava: The vessel was normal in size. - Pericardium, extracardiac: There was no pericardial effusion.  Impressions:  - No cardiac source of emboli was indentified.   Carotid Dopplers 10/14/15: Carotid Duplex Completed. Mild plaque visualized in both carotid arteries with no evidence of stenosis noted. Left CCA/ICA BPG is patent with no evidence of stenosis. Bilateral vertebral arteries are patent with antegrade flow.  Antibiotics:  None   Discharge Exam:  Complaints: Speech has normalized. Persisting left-sided weakness  Filed Vitals:   10/13/15 2116 10/14/15 0136 10/14/15 0543  10/14/15 1328  BP:  159/93 167/93 145/84 144/84  Pulse: 70 72 71 76  Temp: 98.4 F (36.9 C) 98 F (36.7 C) 97.8 F (36.6 C) 98.4 F (36.9 C)  TempSrc: Oral Oral Oral Oral  Resp: 18 18 18 18   Height:      Weight:      SpO2: 92% 94% 90% 92%    General exam: pleasant elderly male sitting up comfortably in chair this morning Respiratory system: Clear. No increased work of breathing. Cardiovascular system: S1 & S2 heard, RRR. No JVD, murmurs, gallops, clicks or pedal edema. Telemetry: SR, occasional PVC's and trigemini. No further episodes of NSSVT. Gastrointestinal system: Abdomen is nondistended, soft and nontender. Normal bowel sounds heard. Central nervous system: Alert and oriented. No focal neurological deficits. Extremities: Right upper extremity chronic weakness-grade 3 x 5 power with contractures. Left hemiparesis, grade 4 x 5 power. Right lower extremity graded 5 x 5 power.  Discharge Instructions      Discharge Instructions    Ambulatory referral to Neurology    Complete by:  As directed   An appointment is requested in approximately: 8 weeks     Call MD for:  difficulty breathing, headache or visual disturbances    Complete by:  As directed      Call MD for:  extreme fatigue    Complete by:  As directed      Call MD for:  persistant dizziness or light-headedness    Complete by:  As directed      Call MD for:  persistant nausea and vomiting    Complete by:  As directed      Call MD for:  severe uncontrolled pain    Complete by:  As directed      Call MD for:  temperature >100.4    Complete by:  As directed      Call MD for:    Complete by:  As directed   New strokelike symptoms.  New strokelike symptoms.     Diet - low sodium heart healthy    Complete by:  As directed      Diet Carb Modified    Complete by:  As directed      Increase activity slowly    Complete by:  As directed             Medication List    TAKE these medications        aspirin EC 325 MG tablet  Take 1  tablet (325 mg total) by mouth daily.     citalopram 10 MG tablet  Commonly known as:  CELEXA  Take 1 tablet (10 mg total) by mouth daily.     lisinopril 20 MG tablet  Commonly known as:  PRINIVIL,ZESTRIL  Take 1 tablet (20 mg total) by mouth daily.     metFORMIN 1000 MG tablet  Commonly known as:  GLUCOPHAGE  take 1 tablet by mouth twice a day with meals     ONE TOUCH ULTRA TEST test strip  Generic drug:  glucose blood  TEST once daily       Follow-up Information    Schedule an appointment as soon as possible for a visit with TODD,JEFFREY Zenia Resides, MD.   Specialty:  Family Medicine   Why:  Upon discharge from inpatient rehabilitation.   Contact information:   [place] [address] [phone]       Follow up with Antony Contras, MD. Schedule an appointment as soon as  possible for a visit in 2 months.   Specialties:  Neurology, Radiology   Contact information:   [address] [place] [address] [phone]        The results of significant diagnostics from this hospitalization (including imaging, microbiology, ancillary and laboratory) are listed below for reference.    Significant Diagnostic Studies: Dg Chest 2 View  10/12/2015  CLINICAL DATA:  Patient with weakness for 1 day. EXAM: CHEST  2 VIEW COMPARISON:  Chest radiograph 10/13/2013. FINDINGS: Stable enlarged cardiac and mediastinal contours. Pulmonary vascular redistribution. No large area of pulmonary consolidation. No pleural effusion or pneumothorax. Probable healing posterior right eighth and ninth rib fractures. Thoracic spine degenerative changes. IMPRESSION: Cardiomegaly with pulmonary vascular redistribution. Probable healing posterior right rib fractures. Electronically Signed   By: Lovey Newcomer M.D.   On: 10/12/2015 13:19   Ct Head Wo Contrast  10/12/2015  CLINICAL DATA:  Left-sided weakness.  History of stroke. EXAM: CT HEAD WITHOUT CONTRAST TECHNIQUE: Contiguous  axial images were obtained from the base of the skull through the vertex without intravenous contrast. COMPARISON:  10/12/2013 FINDINGS: Large area of chronic encephalomalacia related to prior stroke involving the left frontal and parietal lobes appears stable. Several old lacunar infarcts bilaterally or also stable involving the basal ganglia. The brain demonstrates no evidence of hemorrhage, acute infarction, edema, mass effect, extra-axial fluid collection, hydrocephalus or mass lesion. The skull is unremarkable. IMPRESSION: Stable appearance of the brain by head CT with old left-sided hemispheric infarct present as well as multiple old bilateral lacunar infarcts. No acute findings. Electronically Signed   By: Aletta Edouard M.D.   On: 10/12/2015 14:42   Mr Brain Wo Contrast (neuro Protocol)  10/12/2015  CLINICAL DATA:  New onset weakness yesterday. Left upper extremity weakness. EXAM: MRI HEAD WITHOUT CONTRAST TECHNIQUE: Multiplanar, multiecho pulse sequences of the brain and surrounding structures were obtained without intravenous contrast. COMPARISON:  CT head without contrast 10/12/2015. MRI brain 12/09/2011. FINDINGS: An acute nonhemorrhagic 11 mm linear infarct is present in the right centrum semiovale. No other acute infarct is present. A large remote left MCA infarct is noted. Separate from the infarct territories, periventricular T2 changes have progressed bilaterally. Multiple remote lacunar infarcts are again noted in the right basal ganglia. New left-sided basal ganglia lacunar infarcts are present since the last MRI. These are not acute. There is ex vacuo dilation of the left lateral ventricle. White matter changes extend into the brainstem. Remote lacunar infarcts in the cerebellum bilaterally are stable. PE internal auditory canals are normal bilaterally. Flow is present in the major intracranial arteries. Globes and orbits are intact. The paranasal sinuses and mastoid air cells are clear.  IMPRESSION: 1. 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 2. Expected progression describes that expected evolution of large left MCA territory nonhemorrhagic infarct. 3. Progressive remote lacunar infarcts in the basal ganglia on the left. 4. Stable remote lacunar infarcts in the basal ganglia on the right. 5. Progressive periventricular white matter changes bilaterally. These results were called by telephone at the time of interpretation on 10/12/2015 at 4:47 pm to Dr. Jeneen Rinks, who verbally acknowledged these results. Electronically Signed   By: San Morelle M.D.   On: 10/12/2015 16:48   Mr Jodene Nam Head/brain Wo Cm  10/12/2015  CLINICAL DATA:  New onset of left-sided weakness. Acute right periventricular white matter infarct. EXAM: MRA HEAD WITHOUT CONTRAST TECHNIQUE: Angiographic images of the Circle of Willis  were obtained using MRA technique without intravenous contrast. COMPARISON:  MRI brain from the same day. FINDINGS: The internal carotid artery is are within normal limits shin high cervical segments through the ICA termini bilaterally. The A1 and M1 segments are normal. The MCA bifurcations are intact. There is mild attenuation of distal MCA branch vessels bilaterally. The vertebral arteries are codominant. The left PICA origin is visualized and normal. The right PICA origin is not visualized. The basilar artery is normal. Both posterior cerebral arteries originate from the basilar tip. The PCA branch vessels are intact. IMPRESSION: 1. Mild medium and distal small vessel disease without significant proximal stenosis, aneurysm, or branch vessel occlusion. Electronically Signed   By: San Morelle M.D.   On: 10/12/2015 19:06    Microbiology: No results found for this or any previous visit (from the past 240 hour(s)).   Labs: Basic Metabolic Panel:  Recent Labs Lab 10/12/15 1200 10/12/15 1221 10/13/15 0108 10/13/15 0258  10/14/15 0655  NA 139 142  --  137 137  K 3.5 3.4*  --  3.2* 3.7  CL 107 104  --  105 103  CO2 25  --   --  24 26  GLUCOSE 149* 145*  --  127* 143*  BUN 8 10  --  10 10  CREATININE 0.68 0.70  --  0.69 0.69  CALCIUM 8.5*  --   --  8.7* 8.5*  MG  --   --  2.1  --   --    Liver Function Tests:  Recent Labs Lab 10/12/15 1200  AST 17  ALT 20  ALKPHOS 43  BILITOT 1.2  PROT 5.9*  ALBUMIN 3.3*   No results for input(s): LIPASE, AMYLASE in the last 168 hours. No results for input(s): AMMONIA in the last 168 hours. CBC:  Recent Labs Lab 10/12/15 1200 10/12/15 1221 10/13/15 0258  WBC 7.3  --  8.6  NEUTROABS 4.9  --   --   HGB 14.9 16.0 15.0  HCT 44.7 47.0 44.4  MCV 88.7  --  88.6  PLT 154  --  161   Cardiac Enzymes: No results for input(s): CKTOTAL, CKMB, CKMBINDEX, TROPONINI in the last 168 hours. BNP: BNP (last 3 results) No results for input(s): BNP in the last 8760 hours.  ProBNP (last 3 results) No results for input(s): PROBNP in the last 8760 hours.  CBG:  Recent Labs Lab 10/13/15 1136 10/13/15 1621 10/13/15 2118 10/14/15 0635 10/14/15 1135  GLUCAP 141* 116* 125* 122* 152*        Signed:  Vernell Leep, MD, FACP, FHM. [facility] Pager [phone]  If 7PM-7AM, please contact night-coverage [email or web address] Password [email or web address] 10/14/2015, 1:40 PM

## 2015-10-15 ENCOUNTER — Inpatient Hospital Stay (HOSPITAL_COMMUNITY): Payer: Medicare Other | Admitting: Occupational Therapy

## 2015-10-15 ENCOUNTER — Inpatient Hospital Stay (HOSPITAL_COMMUNITY): Payer: Medicare Other

## 2015-10-15 DIAGNOSIS — E46 Unspecified protein-calorie malnutrition: Secondary | ICD-10-CM

## 2015-10-15 DIAGNOSIS — E8809 Other disorders of plasma-protein metabolism, not elsewhere classified: Secondary | ICD-10-CM

## 2015-10-15 LAB — COMPREHENSIVE METABOLIC PANEL
ALT: 37 U/L (ref 17–63)
AST: 28 U/L (ref 15–41)
Albumin: 3.2 g/dL — ABNORMAL LOW (ref 3.5–5.0)
Alkaline Phosphatase: 44 U/L (ref 38–126)
Anion gap: 7 (ref 5–15)
BILIRUBIN TOTAL: 0.9 mg/dL (ref 0.3–1.2)
BUN: 12 mg/dL (ref 6–20)
CHLORIDE: 105 mmol/L (ref 101–111)
CO2: 26 mmol/L (ref 22–32)
CREATININE: 0.76 mg/dL (ref 0.61–1.24)
Calcium: 8.9 mg/dL (ref 8.9–10.3)
Glucose, Bld: 133 mg/dL — ABNORMAL HIGH (ref 65–99)
Potassium: 3.9 mmol/L (ref 3.5–5.1)
Sodium: 138 mmol/L (ref 135–145)
TOTAL PROTEIN: 6 g/dL — AB (ref 6.5–8.1)

## 2015-10-15 LAB — CBC WITH DIFFERENTIAL/PLATELET
BASOS PCT: 1 %
Basophils Absolute: 0.1 10*3/uL (ref 0.0–0.1)
EOS PCT: 4 %
Eosinophils Absolute: 0.3 10*3/uL (ref 0.0–0.7)
HEMATOCRIT: 45.1 % (ref 39.0–52.0)
Hemoglobin: 15.1 g/dL (ref 13.0–17.0)
LYMPHS ABS: 2.1 10*3/uL (ref 0.7–4.0)
Lymphocytes Relative: 24 %
MCH: 29.5 pg (ref 26.0–34.0)
MCHC: 33.5 g/dL (ref 30.0–36.0)
MCV: 88.3 fL (ref 78.0–100.0)
MONO ABS: 0.7 10*3/uL (ref 0.1–1.0)
Monocytes Relative: 8 %
Neutro Abs: 5.4 10*3/uL (ref 1.7–7.7)
Neutrophils Relative %: 63 %
PLATELETS: 166 10*3/uL (ref 150–400)
RBC: 5.11 MIL/uL (ref 4.22–5.81)
RDW: 13.3 % (ref 11.5–15.5)
WBC: 8.6 10*3/uL (ref 4.0–10.5)

## 2015-10-15 LAB — GLUCOSE, CAPILLARY
GLUCOSE-CAPILLARY: 122 mg/dL — AB (ref 65–99)
Glucose-Capillary: 138 mg/dL — ABNORMAL HIGH (ref 65–99)
Glucose-Capillary: 127 mg/dL — ABNORMAL HIGH (ref 65–99)
Glucose-Capillary: 117 mg/dL — ABNORMAL HIGH (ref 65–99)

## 2015-10-15 MED ORDER — BENEPROTEIN PO POWD
1.0000 | Freq: Three times a day (TID) | ORAL | Status: DC
  Administered 2015-10-15 – 2015-10-26 (×23): 6 g via ORAL
  Filled 2015-10-15: qty 227

## 2015-10-15 MED ORDER — HYDRALAZINE HCL 10 MG PO TABS
10.0000 mg | ORAL_TABLET | ORAL | Status: DC | PRN
Start: 2015-10-15 — End: 2015-10-26

## 2015-10-15 NOTE — IPOC Note (Addendum)
Overall Plan of Care [facility]) Patient Details Name: Darrell [name] MRN: [id number] DOB: Oct 27, 1945  Admitting Diagnosis: RT CVA  Hospital Problems: Principal Problem:   Hemiparesis affecting dominant side as late effect of stroke ([place]) Active Problems:   Hyperlipidemia   Obstructive sleep apnea   Essential hypertension   Hemiplegia affecting right dominant side (HCC)   Right spastic hemiparesis (HCC)   BPH associated with nocturia   Left-sided weakness   Poorly controlled type 2 diabetes mellitus with peripheral neuropathy ([place])   History of CVA with residual deficit   Depression   Basal ganglia infarction ([place])   Hypoalbuminemia due to protein-calorie malnutrition ([place])     Functional Problem List: Nursing Pain, Safety, Bladder, Bowel  PT Balance, Endurance, Motor, Safety  OT Balance, Cognition, Edema, Endurance, Motor, Pain, Safety, Sensory, Skin Integrity  SLP    TR         Basic ADL's: OT Eating, Grooming, Bathing, Dressing, Toileting     Advanced  ADL's: OT       Transfers: PT Bed Mobility, Bed to Chair, Car, Manufacturing systems engineer, Metallurgist: PT Ambulation, Stairs     Additional Impairments: OT Fuctional Use of Upper Extremity  SLP        TR      Anticipated Outcomes Item Anticipated Outcome  Self Feeding supervision  Swallowing      Basic self-care  supervision  Toileting  supervision   Bathroom Transfers supervision  Bowel/Bladder  Mod I  Transfers  supervision  Locomotion  supervision gait;  min assist stairs  Communication     Cognition     Pain  <3  Safety/Judgment  Supervision   Therapy Plan: PT Intensity: Minimum of 1-2 x/day ,45 to 90 minutes PT Frequency: 5 out of 7 days PT Duration Estimated Length of Stay: 14 days OT Intensity: Minimum of 1-2 x/day, 45 to 90 minutes OT Frequency: 5 out of 7 days OT Duration/Estimated Length of Stay: 10-12 days         Team Interventions: Nursing Interventions  Disease Management/Prevention, Patient/Family Education  PT interventions Ambulation/gait training, Training and development officer, Disease management/prevention, Community reintegration, Cognitive remediation/compensation, Discharge planning, DME/adaptive equipment instruction, Functional mobility training, Functional electrical stimulation, Neuromuscular re-education, Pain management, Patient/family education, Psychosocial support, Skin care/wound management, Stair training, Therapeutic Exercise, Therapeutic Activities, Splinting/orthotics, UE/LE Strength taining/ROM, UE/LE Coordination activities, Wheelchair propulsion/positioning  OT Interventions Training and development officer, Cognitive remediation/compensation, Community reintegration, Discharge planning, Functional mobility training, Functional electrical stimulation, Splinting/orthotics, Patient/family education, UE/LE Coordination activities, Therapeutic Activities, Psychosocial support, Pain management, Skin care/wound managment, UE/LE Strength taining/ROM, DME/adaptive equipment instruction, Disease mangement/prevention, Neuromuscular re-education, Self Care/advanced ADL retraining, Therapeutic Exercise, Wheelchair propulsion/positioning  SLP Interventions    TR Interventions    SW/CM Interventions Discharge Planning, Psychosocial Support, Patient/Family Education    Team Discharge Planning: Destination: PT-Home ,OT- Home , SLP-  Projected Follow-up: PT-24 hour supervision/assistance (HHPT v OPPT), OT-  Outpatient OT, SLP-  Projected Equipment Needs: PT-To be determined, OT- To be determined, SLP-  Equipment Details: PT-Pt has cane at home, OT-  Patient/family involved in discharge planning: PT- Patient,  OT-Patient, Family member/caregiver, SLP-   MD ELOS: 10-13 days. Sounds familiar Medical Rehab Prognosis:  Good Assessment:  69 y.o. right handed male with history of remote tobacco abuse, hypertension, diabetes mellitus and peripheral  neuropathy, left MCA/PCA watershed infarct 2013 with mild right-sided residual weakness, status post left CEA received inpatient rehabilitation services February 2013. Patient used a cane prior to  admission. Patient required help with ADLs at baseline. Wife can assist as needed at discharge. Presented 10/12/2015 with left-sided weakness and slurred speech. MRI of the brain showed acute nonhemorrhagic white matter infarct in the right centrum semi-as well as progressive remote lacunar infarcts in the basal ganglia on the left stable remote lacunar infarcts basal ganglia on the right. MRA of the head mild medium and distal small vessel disease without significant stenosis or occlusion. Echocardiogram with ejection fraction of [id number] grade 1 diastolic dysfunction. Neurology follow-up maintain on aspirin for CVA prophylaxis subcutaneous Lovenox for DVT prophylaxis.   See Team Conference Notes for weekly updates to the plan of care

## 2015-10-15 NOTE — Care Management Note (Signed)
Inpatient [facility] Individual Statement of Services  Patient Name:  Darrell [name]  Date:  10/15/2015  Welcome to the [facility].  Our goal is to provide you with an individualized program based on your diagnosis and situation, designed to meet your specific needs.  With this comprehensive rehabilitation program, you will be expected to participate in at least 3 hours of rehabilitation therapies Monday-Friday, with modified therapy programming on the weekends.  Your rehabilitation program will include the following services:  Physical Therapy (PT), Occupational Therapy (OT), 24 hour per day rehabilitation nursing, Therapeutic Recreaction (TR), Case Management (Social Worker), Rehabilitation Medicine, Nutrition Services and Pharmacy Services  Weekly team conferences will be held on Wednesday to discuss your progress.  Your Social Worker will talk with you frequently to get your input and to update you on team discussions.  Team conferences with you and your family in attendance may also be held.  Expected length of stay: 12-14 days  Overall anticipated outcome: supervision with set up/cueing  Depending on your progress and recovery, your program may change. Your Social Worker will coordinate services and will keep you informed of any changes. Your Social Worker's name and contact numbers are listed  below.  The following services may also be recommended but are not provided by the [facility]:    [place] will be made to provide these services after discharge if needed.  Arrangements include referral to agencies that provide these services.  Your insurance has been verified to be:  Medicare & Aetna Your primary doctor is:  Stevie Kern  Pertinent information will be shared with your doctor and your insurance company.  Social Worker:  Ovidio Kin, [place] or [phone]  Information discussed with and copy given to patient by: Elease Hashimoto, 10/15/2015, 9:33 AM

## 2015-10-15 NOTE — Progress Notes (Signed)
Patient information reviewed and entered into eRehab system by Damir Leung, RN, CRRN, PPS Coordinator.  Information including medical coding and functional independence measure will be reviewed and updated through discharge.     Per nursing patient was given "Data Collection Information Summary for Patients in Inpatient Rehabilitation Facilities with attached "Privacy Act Statement-Health Care Records" upon admission.  

## 2015-10-15 NOTE — Plan of Care (Signed)
Problem: RH PAIN MANAGEMENT Goal: RH STG PAIN MANAGED AT OR BELOW PT'S PAIN GOAL <3 Outcome: Progressing No c/o pain     

## 2015-10-15 NOTE — Evaluation (Signed)
Occupational Therapy Assessment and Plan  Patient Details  Name: Darrell [name] MRN: [id number] Date of Birth: October 09, 1946  OT Diagnosis: hemiplegia affecting dominant side Rehab Potential: Rehab Potential (ACUTE ONLY): Good ELOS:   10-12 days  Today's Date: 10/15/2015 OT Individual Time: 1045-1200 OT Individual Time Calculation (min): 75 min     Problem List:  Patient Active Problem List   Diagnosis Date Noted  . Hypoalbuminemia due to protein-calorie malnutrition ([place]) 10/15/2015  . Poorly controlled type 2 diabetes mellitus with peripheral neuropathy ([place]) 10/14/2015  . History of CVA with residual deficit 10/14/2015  . Hemiparesis affecting dominant side as late effect of stroke ([place]) 10/14/2015  . Depression 10/14/2015  . Basal ganglia infarction ([place]) 10/14/2015  . Acute ischemic stroke ([place])   . Left-sided weakness 10/12/2015  . Abnormal CXR 10/12/2015  . BPH associated with nocturia 08/20/2015  . Aftercare following surgery of the circulatory system, [place] 02/13/2014  . Postop check 02/06/2014  . Left inguinal hernia 12/28/2013  . Rib fractures 10/13/2013  . Fall 10/13/2013  . Dysplastic nevus of trunk 02/14/2013  . Bicipital tendinitis of right shoulder 07/12/2012  . Subacromial bursitis, right 03/28/2012  . Right spastic hemiparesis ([place]) 02/02/2012  . Occlusion and stenosis of carotid artery with cerebral infarction 01/26/2012  . Hemiplegia affecting right dominant side ([place]) 12/24/2011  . CVA (cerebral infarction) 12/15/2011  . Occlusion and stenosis of carotid artery without mention of cerebral infarction 12/08/2011  . Carotid artery plaque 12/04/2011  . Left carotid bruit 11/30/2011  . AUTONOMIC NEUROPATHY, DIABETIC 11/25/2010  . Hyperlipidemia 03/15/2008  . Obstructive sleep apnea 07/27/2007  . Diabetes 1.5, managed as type 2 ([place]) 06/15/2007  . Essential hypertension 06/15/2007    Past Medical History:  Past Medical History  Diagnosis Date  .  Diabetes mellitus   . Hypertension     dr Sherren Mocha  . Sleep apnea     cpap      sleep study > 4 yrs; no longer wearing CPAP  . Stroke [facility])     rt side weakness, 2013  . Poorly controlled type 2 diabetes mellitus with peripheral neuropathy ([place]) 10/14/2015  . History of CVA with residual deficit 10/14/2015   Past Surgical History:  Past Surgical History  Procedure Laterality Date  . Heel spur surgery    . [place] surgery    . Endarterectomy  12/09/2011    Procedure: ENDARTERECTOMY CAROTID;  Surgeon: Tinnie Gens, MD;  Location: [facility] OR;  Service: Vascular;  Laterality: Left;  Left Carotid endarterectomy with dacron patch angioplasty  . Carotid endarterectomy  12/09/11    Left  . Hernia repair      x2  . Ankle surgery    . Inguinal hernia repair Left 01/22/2014    Procedure: LEFT INGUINAL HERNIA REPAIR WITH MESH;  Surgeon: [name], MD;  Location: [place];  Service: General;  Laterality: Left;  . Insertion of mesh Left 01/22/2014    Procedure: INSERTION OF MESH;  Surgeon: [name], MD;  Location: [place];  Service: General;  Laterality: Left;  . Colonoscopy      Assessment & Plan Clinical Impression: Patient is a 69 y.o. year old male right handed male with history of remote tobacco abuse, hypertension, diabetes mellitus and peripheral neuropathy, left MCA/PCA watershed infarct 2013 with mild right-sided residual weakness, status post left CEA received inpatient rehabilitation services February 2013. Patient lives with spouse to level townhome 2 step to entry bedroom on first floor. Patient used a  cane prior to admission. Patient required help with ADLs at baseline. Wife can assist as needed at discharge. Presented 10/12/2015 with left-sided weakness and slurred speech. MRI of the brain showed acute nonhemorrhagic white matter infarct in the right centrum semi-as well as progressive remote lacunar infarcts in the basal ganglia on the left stable remote lacunar infarcts basal ganglia on the  right. Patient did not receive TPA. MRA of the head mild medium and distal small vessel disease without significant stenosis or occlusion. Echocardiogram with ejection fraction of 54% grade 1 diastolic dysfunction. Carotid Dopplers with no evidence of ICA stenosis. Neurology follow-up maintain on aspirin for CVA prophylaxis subcutaneous Lovenox for DVT prophylaxis. Tolerating a regular consistency diet.  Patient transferred to CIR on 10/14/2015 .    Patient currently requires mod to max A  with basic self-care skills and min to mod A for basic mobility  secondary to muscle weakness, decreased cardiorespiratoy endurance, impaired timing and sequencing, abnormal tone, unbalanced muscle activation, decreased coordination and decreased motor planning, decreased safety awareness and decreased memory and decreased standing balance, decreased postural control, hemiplegia and decreased balance strategies.  Prior to hospitalization, patient could complete ADL with supervision.  Patient will benefit from skilled intervention to decrease level of assist with basic self-care skills and increase independence with basic self-care skills prior to discharge home with care partner.  Anticipate patient will require 24 hour supervision and follow up outpatient.  OT - End of Session Endurance Deficit: Yes OT Assessment Rehab Potential (ACUTE ONLY): Good OT Patient demonstrates impairments in the following area(s): Balance;Cognition;Edema;Endurance;Motor;Pain;Safety;Sensory;Skin Integrity OT Basic ADL's Functional Problem(s): Eating;Grooming;Bathing;Dressing;Toileting OT Transfers Functional Problem(s): Toilet;Tub/Shower OT Additional Impairment(s): Fuctional Use of Upper Extremity OT Plan OT Intensity: Minimum of 1-2 x/day, 45 to 90 minutes OT Frequency: 5 out of 7 days OT Treatment/Interventions: Balance/vestibular training;Cognitive remediation/compensation;Community reintegration;Discharge planning;Functional  mobility training;Functional electrical stimulation;Splinting/orthotics;Patient/family education;UE/LE Coordination activities;Therapeutic Activities;Psychosocial support;Pain management;Skin care/wound managment;UE/LE Strength taining/ROM;DME/adaptive equipment instruction;Disease mangement/prevention;Neuromuscular re-education;Self Care/advanced ADL retraining;Therapeutic Exercise;Wheelchair propulsion/positioning OT Self Feeding Anticipated Outcome(s): supervision OT Basic Self-Care Anticipated Outcome(s): supervision OT Toileting Anticipated Outcome(s): supervision OT Bathroom Transfers Anticipated Outcome(s): supervision OT Recommendation Patient destination: Home Follow Up Recommendations: Outpatient OT Equipment Recommended: To be determined   Skilled Therapeutic Intervention 1:1 OT eval initiated with OT purpose, role and goals discussed with pt and pt's wife. Self care retraining at shower level with focus on normal patterns of movement of left UE (now dominant use), left hand grasp,  sit to stands, standing balance, dynamic balance during functional tasks, activity tolerance. Perform functional ambulation in the hallway using the rail as a cane for support; able to ambulate with rail with min A with extra time; mod A without rail.   2nd session 13:00-13:45 (98mn) 1:1 focus on NMR of left UE with focus on normal patterns of movement, pincher grasp, functional reach grasp and release with [facility]activity on tap top. Pt with abnormal pattern of shoulder abduction and elevation to obtain pegs- focus on keep elbow adducted towards trunk focusing on distal coordination.  Also review theraputty exercises to practice in his room during down time.   OT Evaluation Precautions/Restrictions  Precautions Precautions: Fall Precaution Comments: old R hemiparesis; new L weakness Restrictions Weight Bearing Restrictions: No General Chart Reviewed: Yes Family/Caregiver Present: Yes (wife ([name] at  the begining of the sessin)   Pain Pain Assessment Pain Assessment: No/denies pain Home Living/Prior Functioning Home Living Living Arrangements: Spouse/significant other Available Help at Discharge: Family, Available 24 hours/day Type of Home:  (  Townhouse) Home Access: Stairs to enter [employer]wer of Steps: 1+1 Home Layout: Two level, Able to live on main level with bedroom/bathroom  Lives With: Spouse Prior Function Level of Independence: Independent with basic ADLs, Independent with gait, Independent with transfers (used SPC during outdoor mobility)  Able to Take Stairs?: Yes Driving: No 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   Vision/Perception  Vision- History Baseline Vision/History: Wears glasses Vision- Assessment Vision Assessment?: No apparent visual deficits  Cognition Overall Cognitive Status: No family/caregiver present to determine baseline cognitive functioning Arousal/Alertness: Awake/alert Orientation Level: Person;Situation;Place Person: Oriented Place: Oriented Situation: Oriented Year: 2016 Month: December Day of Week: Correct Memory: Appears intact Immediate Memory Recall: Sock;Bed;Blue Memory Recall: Blue;Bed (unable to recall) Memory Recall Blue: Without Cue Memory Recall Bed: Without Cue Attention: Sustained Sustained Attention: Appears intact Awareness: Appears intact Problem Solving: Appears intact Safety/Judgment: Appears intact Comments: decreased awareness of deficits/balance during functional mobility at times Sensation Sensation Light Touch: Appears Intact Proprioception: Appears Intact Coordination Gross Motor Movements are Fluid and Coordinated: No Fine Motor Movements are Fluid and Coordinated: No Heel Shin Test: difficulty noted R and L Motor  Motor Motor: Hemiplegia;Abnormal postural alignment and control;Abnormal tone Motor - Skilled Clinical Observations: LUE  spastic hemiparesis residual from previous CVA Mobility  Transfers Transfers: Sit to Stand;Stand to Sit Sit to Stand: 4: Min assist Stand to Sit: 4: Min assist  Trunk/Postural Assessment  Cervical Assessment Cervical Assessment:  (forward head) Thoracic Assessment Thoracic Assessment:  (flexed posture) Lumbar Assessment Lumbar Assessment:  (posterior pelvic tilt) Postural Control Postural Control: Deficits on evaluation Trunk Control: posterior lean; decreased trunk control noted seated EOB when attempting to lift up LE's; tendency for posterior LOB during standing balance  Balance Balance Balance Assessed: Yes Standardized Balance Assessment Standardized Balance Assessment: Timed Up and Go Test Timed Up and Go Test TUG: Normal TUG Normal TUG (seconds): 26 (Trial 1: 30; Trial 2: 27; ) Static Sitting Balance Static Sitting - Level of Assistance: 5: Stand by assistance;4: Min assist Dynamic Sitting Balance Dynamic Sitting - Level of Assistance: 4: Min assist Static Standing Balance Static Standing - Level of Assistance: 3: Mod assist;4: Min assist Dynamic Standing Balance Dynamic Standing - Level of Assistance: 4: Min assist;3: Mod assist;2: Max assist Extremity/Trunk Assessment RUE Assessment RUE Assessment: Exceptions to [facility] RUE Strength RUE Overall Strength Comments: Brunstrom III (lmited grip in hand); presents with rigditty RUE Tone RUE Tone: Modified Ashworth Modified Ashworth Scale for Grading Hypertonia RUE: More marked increase in muscle tone through most of the ROM, but affected part(s) easily moved RUE Tone Comments: old CVA affected right side LUE Assessment LUE Assessment: Exceptions to [facility] LUE Strength LUE Overall Strength Comments: Brunstrom IV in UE; limited grasp LUE Tone LUE Tone: Mild LUE Tone Comments: limited grip strength- uses wrist extension for a grasp.    See Function Navigator for Current Functional Status.   Refer to Care Plan for Long  Term Goals  Recommendations for other services: None  Discharge Criteria: Patient will be discharged from OT if patient refuses treatment 3 consecutive times without medical reason, if treatment goals not met, if there is a change in medical status, if patient makes no progress towards goals or if patient is discharged from hospital.  The above assessment, treatment plan, treatment alternatives and goals were discussed and mutually agreed upon: by patient and by family  Nicoletta Ba 10/15/2015, 11:44 AM

## 2015-10-15 NOTE — Evaluation (Signed)
Physical Therapy Assessment and Plan  Patient Details  Name: Manual Navarra MRN: [id number] Date of Birth: 10/31/1945  PT Diagnosis: Difficulty walking, Hemiparesis (old R sided; new L sided weakness), Hypertonia, Impaired cognition and Muscle weakness Rehab Potential: Good ELOS: 14 days   Today's Date: 10/15/2015 PT Individual Time: 0800-0930 PT Individual Time Calculation (min): 90 min    Problem List:  Patient Active Problem List   Diagnosis Date Noted  . Hypoalbuminemia due to protein-calorie malnutrition ([place]) 10/15/2015  . Poorly controlled type 2 diabetes mellitus with peripheral neuropathy ([place]) 10/14/2015  . History of CVA with residual deficit 10/14/2015  . Hemiparesis affecting dominant side as late effect of stroke ([place]) 10/14/2015  . Depression 10/14/2015  . Basal ganglia infarction ([place]) 10/14/2015  . Acute ischemic stroke ([place])   . Left-sided weakness 10/12/2015  . Abnormal CXR 10/12/2015  . BPH associated with nocturia 08/20/2015  . Aftercare following surgery of the circulatory system, [place] 02/13/2014  . Postop check 02/06/2014  . Left inguinal hernia 12/28/2013  . Rib fractures 10/13/2013  . Fall 10/13/2013  . Dysplastic nevus of trunk 02/14/2013  . Bicipital tendinitis of right shoulder 07/12/2012  . Subacromial bursitis, right 03/28/2012  . Right spastic hemiparesis ([place]) 02/02/2012  . Occlusion and stenosis of carotid artery with cerebral infarction 01/26/2012  . Hemiplegia affecting right dominant side ([place]) 12/24/2011  . CVA (cerebral infarction) 12/15/2011  . Occlusion and stenosis of carotid artery without mention of cerebral infarction 12/08/2011  . Carotid artery plaque 12/04/2011  . Left carotid bruit 11/30/2011  . AUTONOMIC NEUROPATHY, DIABETIC 11/25/2010  . Hyperlipidemia 03/15/2008  . Obstructive sleep apnea 07/27/2007  . Diabetes 1.5, managed as type 2 ([place]) 06/15/2007  . Essential hypertension 06/15/2007    Past Medical History:   Past Medical History  Diagnosis Date  . Diabetes mellitus   . Hypertension     dr Sherren Mocha  . Sleep apnea     cpap      sleep study > 4 yrs; no longer wearing CPAP  . Stroke [facility])     rt side weakness, 2013  . Poorly controlled type 2 diabetes mellitus with peripheral neuropathy ([place]) 10/14/2015  . History of CVA with residual deficit 10/14/2015   Past Surgical History:  Past Surgical History  Procedure Laterality Date  . Heel spur surgery    . [place] surgery    . Endarterectomy  12/09/2011    Procedure: ENDARTERECTOMY CAROTID;  Surgeon: Tinnie Gens, MD;  Location: [facility] OR;  Service: Vascular;  Laterality: Left;  Left Carotid endarterectomy with dacron patch angioplasty  . Carotid endarterectomy  12/09/11    Left  . Hernia repair      x2  . Ankle surgery    . Inguinal hernia repair Left 01/22/2014    Procedure: LEFT INGUINAL HERNIA REPAIR WITH MESH;  Surgeon: [name], MD;  Location: [place];  Service: General;  Laterality: Left;  . Insertion of mesh Left 01/22/2014    Procedure: INSERTION OF MESH;  Surgeon: [name], MD;  Location: [place];  Service: General;  Laterality: Left;  . Colonoscopy      Assessment & Plan Clinical Impression: Patient is a 69 y.o. year old male with recent admission to the hospital with history of hypertension, diabetes mellitus and peripheral neuropathy, left MCA/PCA watershed infarct 2013 with mild right-sided residual weakness, status post left CEA received inpatient rehabilitation services February 2013. Patient lives with spouse to level townhome 2 step to entry bedroom  on first floor. Patient used a cane prior to admission. Patient required help with ADLs at baseline. Wife can assist as needed at discharge. Presented 10/12/2015 with left-sided weakness and slurred speech. MRI of the brain showed acute nonhemorrhagic white matter infarct in the right centrum semi-as well as progressive remote lacunar infarcts in the basal ganglia on the left stable remote  lacunar infarcts basal ganglia on the right. Patient did not receive TPA. MRA of the head mild medium and distal small vessel disease without significant stenosis or occlusion. Echocardiogram with ejection fraction of 60% grade 1 diastolic dysfunction. Carotid Dopplers with no evidence of ICA stenosis. Neurology follow-up maintain on aspirin for CVA prophylaxis subcutaneous Lovenox for DVT prophylaxis. Tolerating a regular consistency diet. Physical therapy evaluation completed 10/13/2015 with recommendations of physical medicine rehabilitation consult..  Patient transferred to CIR on 10/14/2015 .   Patient currently requires min to moderate assist with mobility secondary to muscle weakness, muscle joint tightness and muscle paralysis, decreased cardiorespiratoy endurance, impaired timing and sequencing, abnormal tone and unbalanced muscle activation, decreased awareness and delayed processing and decreased sitting balance, decreased standing balance, decreased postural control, hemiplegia and decreased balance strategies.  Prior to hospitalization, patient was modified independent  with mobility and lived with Spouse in a  (Townhouse) home.  Home access is 1+1Stairs to enter.  Patient will benefit from skilled PT intervention to maximize safe functional mobility, minimize fall risk and decrease caregiver burden for planned discharge home with 24 hour supervision.  Anticipate patient will HHPT v OPPT pending progress at discharge.  PT - End of Session Activity Tolerance: Decreased this session Endurance Deficit: Yes PT Assessment Rehab Potential (ACUTE/IP ONLY): Good PT Patient demonstrates impairments in the following area(s): Balance;Endurance;Motor;Safety PT Transfers Functional Problem(s): Bed Mobility;Bed to Chair;Car;Furniture PT Locomotion Functional Problem(s): Ambulation;Stairs PT Plan PT Intensity: Minimum of 1-2 x/day ,45 to 90 minutes PT Frequency: 5 out of 7 days PT Duration Estimated  Length of Stay: 14 days PT Treatment/Interventions: Ambulation/gait training;Balance/vestibular training;Disease management/prevention;Community reintegration;Cognitive remediation/compensation;Discharge planning;DME/adaptive equipment instruction;Functional mobility training;Functional electrical stimulation;Neuromuscular re-education;Pain management;Patient/family education;Psychosocial support;Skin care/wound management;Stair training;Therapeutic Exercise;Therapeutic Activities;Splinting/orthotics;UE/LE Strength taining/ROM;UE/LE Coordination activities;Wheelchair propulsion/positioning PT Transfers Anticipated Outcome(s): supervision PT Locomotion Anticipated Outcome(s): supervision gait;  min assist stairs PT Recommendation Follow Up Recommendations: 24 hour supervision/assistance (HHPT v OPPT) Patient destination: Home Equipment Recommended: To be determined Equipment Details: Pt has cane at home  Skilled Therapeutic Intervention Individual treatment initiated with focus on orientation to CIR/PT POC/goals, neuro re-ed to address balance impairments and balance reactions, standardized testing for TUG to assess fall risk, basic transfer training including simulated car transfers, stair negotiation training, and functional gait training without AD to challenge balance. Pt overall min to moderate assist with mod assist needed when LOB occurred during mobility (especially with turns).   PT Evaluation Precautions/Restrictions Precautions Precautions: Fall Precaution Comments: old R hemiparesis; new L weakness Restrictions Weight Bearing Restrictions: No Pain Pain Assessment Pain Assessment: No/denies pain Home Living/Prior Functioning Home Living Living Arrangements: Spouse/significant other Available Help at Discharge: Family;Available 24 hours/day Type of Home:  (Townhouse) Home Access: Stairs to enter [employer] of Steps: 1+1 Home Layout: Two level;Able to live on main  level with bedroom/bathroom  Lives With: Spouse Prior Function Level of Independence: Independent with basic ADLs;Independent with gait;Independent with transfers (used SPC during outdoor mobility)  Able to Take Stairs?: Yes Driving: No 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 Overall Cognitive Status: No family/caregiver present to determine baseline cognitive functioning  Arousal/Alertness: Awake/alert Orientation Level: Oriented X4 Attention: Sustained Sustained Attention: Appears intact Memory: Appears intact Awareness: Appears intact Problem Solving: Appears intact Safety/Judgment: Appears intact Comments: decreased awareness of deficits/balance during functional mobility at times Sensation Sensation Light Touch: Appears Intact (BLE) Proprioception: Appears Intact Coordination Gross Motor Movements are Fluid and Coordinated: No Fine Motor Movements are Fluid and Coordinated: No Heel Shin Test: difficulty noted R and L Motor  Motor Motor: Hemiplegia;Abnormal postural alignment and control;Abnormal tone Motor - Skilled Clinical Observations: LUE spastic hemiparesis residual from previous CVA   Trunk/Postural Assessment  Cervical Assessment Cervical Assessment:  (forward head) Thoracic Assessment Thoracic Assessment:  (flexed posture) Lumbar Assessment Lumbar Assessment:  (posterior pelvic tilt) Postural Control Postural Control: Deficits on evaluation Trunk Control: posterior lean; decreased trunk control noted seated EOB when attempting to lift up LE's; tendency for posterior LOB during standing balance  Balance Balance Balance Assessed: Yes Standardized Balance Assessment Standardized Balance Assessment: Timed Up and Go Test Timed Up and Go Test TUG: Normal TUG Normal TUG (seconds): 26 (Trial 1: 30; Trial 2: 27; ) Static Sitting Balance Static Sitting - Level of Assistance: 5: Stand by assistance;4: Min  assist Dynamic Sitting Balance Dynamic Sitting - Level of Assistance: 4: Min assist Static Standing Balance Static Standing - Level of Assistance: 3: Mod assist;4: Min assist Dynamic Standing Balance Dynamic Standing - Level of Assistance: 4: Min assist;3: Mod assist;2: Max assist Extremity Assessment  RUE Assessment RUE Assessment: Exceptions to [facility] RUE Strength RUE Overall Strength Comments: Brunstrom III (lmited grip in hand); presents with rigditty RUE Tone RUE Tone: Modified Ashworth Modified Ashworth Scale for Grading Hypertonia RUE: More marked increase in muscle tone through most of the ROM, but affected part(s) easily moved RUE Tone Comments: old CVA affected right side LUE Assessment LUE Assessment: Exceptions to [facility] LUE Strength LUE Overall Strength Comments: Brunstrom IV in UE; limited grasp LUE Tone LUE Tone: Mild LUE Tone Comments: limited grip strength- uses wrist extension for a grasp.  RLE Assessment RLE Assessment: Exceptions to [facility] RLE Strength RLE Overall Strength Comments: grossly 4+/5 LLE Assessment LLE Assessment: Exceptions to [facility] LLE Strength LLE Overall Strength Comments: grossly 4/5   See Function Navigator for Current Functional Status.   Refer to Care Plan for Long Term Goals  Recommendations for other services: None  Discharge Criteria: Patient will be discharged from PT if patient refuses treatment 3 consecutive times without medical reason, if treatment goals not met, if there is a change in medical status, if patient makes no progress towards goals or if patient is discharged from hospital.  The above assessment, treatment plan, treatment alternatives and goals were discussed and mutually agreed upon: by patient  Juanna Cao, PT, DPT  10/15/2015, 12:26 PM

## 2015-10-15 NOTE — Progress Notes (Signed)
Social Work  Social Work Assessment and Plan  Patient Details  Name: Darrell [name] MRN: [id number] Date of Birth: November 25, 1945  Today's Date: 10/15/2015  Problem List:  Patient Active Problem List   Diagnosis Date Noted  . Hypoalbuminemia due to protein-calorie malnutrition ([place]) 10/15/2015  . Poorly controlled type 2 diabetes mellitus with peripheral neuropathy ([place]) 10/14/2015  . History of CVA with residual deficit 10/14/2015  . Hemiparesis affecting dominant side as late effect of stroke ([place]) 10/14/2015  . Depression 10/14/2015  . Basal ganglia infarction ([place]) 10/14/2015  . Acute ischemic stroke ([place])   . Left-sided weakness 10/12/2015  . Abnormal CXR 10/12/2015  . BPH associated with nocturia 08/20/2015  . Aftercare following surgery of the circulatory system, [place] 02/13/2014  . Postop check 02/06/2014  . Left inguinal hernia 12/28/2013  . Rib fractures 10/13/2013  . Fall 10/13/2013  . Dysplastic nevus of trunk 02/14/2013  . Bicipital tendinitis of right shoulder 07/12/2012  . Subacromial bursitis, right 03/28/2012  . Right spastic hemiparesis ([place]) 02/02/2012  . Occlusion and stenosis of carotid artery with cerebral infarction 01/26/2012  . Hemiplegia affecting right dominant side ([place]) 12/24/2011  . CVA (cerebral infarction) 12/15/2011  . Occlusion and stenosis of carotid artery without mention of cerebral infarction 12/08/2011  . Carotid artery plaque 12/04/2011  . Left carotid bruit 11/30/2011  . AUTONOMIC NEUROPATHY, DIABETIC 11/25/2010  . Hyperlipidemia 03/15/2008  . Obstructive sleep apnea 07/27/2007  . Diabetes 1.5, managed as type 2 ([place]) 06/15/2007  . Essential hypertension 06/15/2007   Past Medical History:  Past Medical History  Diagnosis Date  . Diabetes mellitus   . Hypertension     dr Sherren Mocha  . Sleep apnea     cpap      sleep study > 4 yrs; no longer wearing CPAP  . Stroke [facility])     rt side weakness, 2013  . Poorly controlled type 2 diabetes  mellitus with peripheral neuropathy ([place]) 10/14/2015  . History of CVA with residual deficit 10/14/2015   Past Surgical History:  Past Surgical History  Procedure Laterality Date  . Heel spur surgery    . [place] surgery    . Endarterectomy  12/09/2011    Procedure: ENDARTERECTOMY CAROTID;  Surgeon: Tinnie Gens, MD;  Location: [facility] OR;  Service: Vascular;  Laterality: Left;  Left Carotid endarterectomy with dacron patch angioplasty  . Carotid endarterectomy  12/09/11    Left  . Hernia repair      x2  . Ankle surgery    . Inguinal hernia repair Left 01/22/2014    Procedure: LEFT INGUINAL HERNIA REPAIR WITH MESH;  Surgeon: [name], MD;  Location: [place];  Service: General;  Laterality: Left;  . Insertion of mesh Left 01/22/2014    Procedure: INSERTION OF MESH;  Surgeon: [name], MD;  Location: [place];  Service: General;  Laterality: Left;  . Colonoscopy     Social History:  reports that he quit smoking about 13 years ago. His smoking use included Cigarettes. He has a 75 pack-year smoking history. He has never used smokeless tobacco. He reports that he does not drink alcohol or use illicit drugs.  Family / Support Systems Marital Status: Married Patient Roles: Spouse, Parent, Volunteer Spouse/Significant Other: Jane [id number] Children: Two daughter's Other Supports: friends and church members Anticipated Caregiver: Wife Ability/Limitations of Caregiver: Wife is in good health and can assist-not much physical care though Caregiver Availability: 24/7 Family Dynamics: Close knit family they have two daughter's  who are involved and supportive. Pt was here on CIR in 2013 and transferred to [facility] and did very well there and went home. He is hopeful he will do well here.  Social History Preferred language: English Religion: Presbyterian Cultural Background: No issues Education: [employer] Educated Read: Yes Write: Yes Employment Status: Retired Freight forwarder  Issues: No issues Guardian/Conservator: None-according to MD pt is capable of making any decisions needed while here   Abuse/Neglect Physical Abuse: Denies Verbal Abuse: Denies Sexual Abuse: Denies Exploitation of patient/patient's resources: Denies Self-Neglect: Denies  Emotional Status Pt's affect, behavior adn adjustment status: Pt is hopeful he will do well here and return home. He doesn't want to be a burden on his wife and realizes there is only so much she can do for him. He will do his best and see how well he progresses here. Recent Psychosocial Issues: other health issues-past CVA in 2013, he did do well and went home from [place] History: History of depression from last stroke, but has not had any problems since then. Will monitor his coping and have neuro-psych see if needed. Substance Abuse History: No issues  Patient / Family Perceptions, Expectations & Goals Pt/Family understanding of illness & functional limitations: Pt and wife can explain his stroke and deficits. They have spoken with MD and feel their questions and concerns are being addressed. He is able to express himself this time where last time he had speech deficits and could not communicate his needs. Premorbid pt/family roles/activities: Husband, Father, grandfather, retiree, church member, friend, etc Anticipated changes in roles/activities/participation: resume Pt/family expectations/goals: Pt states: " I want to get back to where I was moving around on my own."  Wife states: " I am hopeful he will do well here. This stroke is much less than before."  [employer]: Other (Comment) (Was in [place] 2013) Premorbid Home Care/DME Agencies: Other (Comment) (Gone to OP rehab in 2013) Transportation available at discharge: Wife Resource referrals recommended: Neuropsychology, Support group (specify)  Discharge Planning Living Arrangements: Spouse/significant  other Support Systems: Spouse/significant other, Children, Other relatives, Friends/neighbors, Church/faith community Type of Residence: Private residence Insurance Resources: [employer], Multimedia programmer (specify) Scientist, clinical (histocompatibility and immunogenetics)) Financial Resources: Fish farm manager, Family Support Financial Screen Referred: No Living Expenses: Lives with family Money Management: Spouse, Patient Does the patient have any problems obtaining your medications?: No Home Management: Wife , pt helps her Patient/Family Preliminary Plans: Return home with his wife and she will assist if needed. He is doing much better than with his last stroke in 2013, when he was here. Familiar with the rehab program and glad to be here. Will await team's evalutions. Social Work Anticipated Follow Up Needs: HH/OP, Support Group  Clinical Impression Pleasant gentleman who is familiar to this worker due to had him in 2013. Supportive wife who will assist at discharge and participate in his care while here. He is doing much better than wit his last stroke, it appears his deficits are mild. He recovered from his last stroke and is hopeful he will again with this one. Await team's evaluations and work on a safe discharge plan.  Elease Hashimoto 10/15/2015, 9:50 AM

## 2015-10-15 NOTE — Progress Notes (Signed)
[place] PHYSICAL MEDICINE & REHABILITATION     PROGRESS NOTE    Subjective/Complaints:  Pt seen alert this AM.  He did not sleep well overnight because his bed was not working, but he is ready to start his day of therapies.   ROS: Denies CP, SOB, n/v/d.   Objective: Vital Signs: Blood pressure 169/96, pulse 72, temperature 98 F (36.7 C), temperature source Oral, resp. rate 17, height 5\' 10"  (A999333 m), weight 93.895 kg (207 lb), SpO2 91 %. No results found.  Recent Labs  10/14/15 1801 10/15/15 0529  WBC 8.1 8.6  HGB 15.2 15.1  HCT 44.7 45.1  PLT 174 166    Recent Labs  10/14/15 0655 10/14/15 1801 10/15/15 0529  NA 137  --  138  K 3.7  --  3.9  CL 103  --  105  GLUCOSE 143*  --  133*  BUN 10  --  12  CREATININE 0.69 0.73 0.76  CALCIUM 8.5*  --  8.9   CBG (last 3)   Recent Labs  10/14/15 1650 10/14/15 2120 10/15/15 0651  GLUCAP 107* 181* 138*    Wt Readings from Last 3 Encounters:  10/14/15 93.895 kg (207 lb)  10/12/15 93.441 kg (206 lb)  08/20/15 94.802 kg (209 lb)    Physical Exam:  BP 169/96 mmHg  Pulse 72  Temp(Src) 98 F (36.7 C) (Oral)  Resp 17  Ht 5\' 10"  (1.778 m)  Wt 93.895 kg (207 lb)  BMI 29.70 kg/m2  SpO2 91% Constitutional: He appears well-developed and well-nourished. Vital signs reviewed.  HENT: Normocephalic and atraumatic.  Eyes: Conjunctivae and EOM are normal.  Neck: Normal range of motion. No thyromegaly present.  Cardiovascular: Normal rate and regular rhythm.  Respiratory: Effort normal and breath sounds normal. No respiratory distress.  GI: Soft. Bowel sounds are normal. He exhibits no distension.  Musculoskeletal: He exhibits no edema or tenderness.  Neurological: He is alert and oriented.  Follows commands Sensation intact to light touch Motor: RUE: 4+/5 proximal to distal, flexor spasticity LUE: Shoulder abduction, elbow flexion and extension 3+/5, hand grip 3-/5 B/L LE: 5/5  Skin: Skin is warm and dry.   Psychiatric: He has a normal mood and affect. His behavior is normal  Assessment/Plan: 1. 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 Physiatrist is providing close team supervision and 24 hour management of active medical problems listed below. Physiatrist and rehab team continue to assess barriers to discharge/monitor patient progress toward functional and medical goals.  Function:  Bathing Bathing position      Bathing parts      Bathing assist        Upper Body Dressing/Undressing Upper body dressing                    Upper body assist        Lower Body Dressing/Undressing Lower body dressing                                  Lower body assist        Toileting Toileting          Toileting assist     Transfers Chair/bed transfer             Locomotion  Ambulation           Wheelchair          Cognition Comprehension Comprehension assist level: Understands complex 90% of the time/cues 10% of the time  Expression Expression assist level: Expresses complex 90% of the time/cues < 10% of the time  Social Interaction Social Interaction assist level: Interacts appropriately with others with medication or extra time (anti-anxiety, antidepressant).  Problem Solving Problem solving assist level: Solves basic problems with no assist  Memory Memory assist level: More than reasonable amount of time    Medical Problem List and Plan: 1. 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  Begin CIR 2. DVT Prophylaxis/Anticoagulation: Subcutaneous Lovenox. Monitor platelet counts for any signs of bleeding 3. Pain Management: Tylenol  as needed 4. Mood: Celexa 10 mg daily. Provide emotional support 5. Neuropsych: This patient is capable of making decisions on his own behalf. 6. Skin/Wound Care: Routine skin checks 7. Fluids/Electrolytes/Nutrition: Routine I&O with follow-up chemistries 8. Hypertension. No current antihypertensive medication. Patient on lisinopril 20 mg daily prior to admission. Will consider resuming  BP elevated this AM, will cont to monitor in light of recent stroke.  Will add PRN BP med for the time being and consider restarting home med in the future 9. Diabetes mellitus with peripheral neuropathy. Hemoglobin A1c 6.9. Glucophage 1000 milligrams twice a day. Check blood sugars before meals and at bedtime. Diabetic teaching 10. Residual spasticity RUE: Will educate on stretching techniques and splinting and consider medications if necessary 11. Hypoalbuminemia: Will start protein supplementation and periodically monitor  LOS (Days) 1 A FACE TO FACE EVALUATION WAS PERFORMED  Samule Life Lorie Phenix 10/15/2015 8:24 AM

## 2015-10-16 ENCOUNTER — Inpatient Hospital Stay (HOSPITAL_COMMUNITY): Payer: Medicare Other

## 2015-10-16 ENCOUNTER — Inpatient Hospital Stay (HOSPITAL_COMMUNITY): Payer: Medicare Other | Admitting: Occupational Therapy

## 2015-10-16 LAB — URINE MICROSCOPIC-ADD ON

## 2015-10-16 LAB — URINALYSIS, ROUTINE W REFLEX MICROSCOPIC
GLUCOSE, UA: NEGATIVE mg/dL
Hgb urine dipstick: NEGATIVE
KETONES UR: 15 mg/dL — AB
Nitrite: NEGATIVE
PH: 5.5 (ref 5.0–8.0)
Protein, ur: NEGATIVE mg/dL
Specific Gravity, Urine: 1.029 (ref 1.005–1.030)

## 2015-10-16 LAB — GLUCOSE, CAPILLARY
GLUCOSE-CAPILLARY: 103 mg/dL — AB (ref 65–99)
Glucose-Capillary: 128 mg/dL — ABNORMAL HIGH (ref 65–99)
Glucose-Capillary: 103 mg/dL — ABNORMAL HIGH (ref 65–99)
Glucose-Capillary: 165 mg/dL — ABNORMAL HIGH (ref 65–99)

## 2015-10-16 NOTE — Progress Notes (Signed)
Social Work Patient ID: [name], male   DOB: January 31, 1946, 69 y.o.   MRN: [id number] Spoke with pt and wife via telephone to discuss team conference goals of supervision level for safety and target discharge 1/3.  Both pleased with the plan. Wife plans to be here and observe pt in therapies tomorrow and will see his progress. Will work on discharge plans.

## 2015-10-16 NOTE — Progress Notes (Signed)
Occupational Therapy Session Note  Patient Details  Name: Darrell [name] MRN: [id number] Date of Birth: 03-Feb-1946  Today's Date: 10/16/2015 OT Individual Time: 1000-1100 OT Individual Time Calculation (min): 60 min    Short Term Goals: Week 1:  OT Short Term Goal 1 (Week 1): Pt will transfer into shower stall with seat with min A  OT Short Term Goal 2 (Week 1): Pt will perform toileting steps with mod A  OT Short Term Goal 3 (Week 1): Pt will don shirt with setup  OT Short Term Goal 4 (Week 1): Pt will perform 2 grooming tasks in standing with supervision  OT Short Term Goal 5 (Week 1): Pt will perform bed mobility with supervision  Skilled Therapeutic Interventions/Progress Updates:    1:1 self care retraining at shower level with focus on functional ambulation with HHA, sit to stands, standing standing, functional use of right hand as stabilizer and left hand with encouragement and extra time, standing endurance while performing grooming tasks at home, etc. Pt with increased ability to use left hand with encouragement and extra time. Pt's first reaction is "I can't; but with extra time pt able to perform tasks.  Issued new elastic shoe laces (old ones too stretched out for good support). Also socks donned on top of TEDS today for better fit of shoes.  Pt noted with cognitive deficits (difficulty with processing information and recall of daily activities).  Pt looks to wife for answers at times. ?? Baseline cognitive deficits.   Therapy Documentation Precautions:  Precautions Precautions: Fall Precaution Comments: old R hemiparesis; new L weakness Restrictions Weight Bearing Restrictions: No Pain: Pain Assessment Pain Assessment: No/denies pain  See Function Navigator for Current Functional Status.   Therapy/Group: Individual Therapy  Willeen Cass [facility] 10/16/2015, 3:32 PM

## 2015-10-16 NOTE — Progress Notes (Signed)
Physical Therapy Session Note  Patient Details  Name: Darrell [name] MRN: [id number] Date of Birth: 04/28/1946  Today's Date: 10/16/2015 PT Individual Time: 0900-1000; [id number] PT Individual Time Calculation (min): 60 min , 78 min  Short Term Goals: Week 1:  PT Short Term Goal 1 (Week 1): Pt will be able to gait with overall min assist x 150' PT Short Term Goal 2 (Week 1): Pt will be able to demonstrate dynamic standing balance with min assist consistently PT Short Term Goal 3 (Week 1): Pt will be able to perform basic transfers with supervision  Skilled Therapeutic Interventions/Progress Updates:  tx 1: neuromuscular re-education via demo, VCS, manual cues, wt bearing ,visual feedback for: W/c propulsion using bil LEs to propel; pelvic dissociation in sitting with and without LE support, RUE lateral leans onto wedge, trunk shortening/lenthening,rotating while reaching within BOS R/L.   Therapeutic activity sit><stand without UEs, focusing on forward wt shifting and core activation via ball between knees,  blocked practice.    Gait without AD with VCs for upright posture, forward gaze,  to return to room; transferred to w/c to rest.  Wife present.  tx 2:  Gait without AD to/from gym, min guard assist.  Cues to stop and rest in standing after 100' due to pt increasing speed and RLe motor control decreasing, with narrow BOS and slightly scissoring RLE.    neuromuscular re-education as above for NuStep at level 4, x 10 minutes, using bil LEs and LUE, focusing on neutral R hip rotation.  Pt unable to tolerate R hand on grip with splint, c/o' ing of R MCP 4-5. Terminal hip extension in tall kneeling with LUE support, x 10.  Pt limited by R hamstring muscle spasm. Sitting -self stretching L/R hamstrings and heel cord, x 30 seconds x 2. Standing on wedge for prolonged heel cord and hamstring stretch, x 3 minutes, with frequent LOB backwards with slight L ankle strategy noted.  With manual  and VCS, slight hip strategy elicited.    Patient demonstrates increased fall risk as noted by score of 32 /56 on Berg Balance Scale.  (<36= high risk for falls, close to 100%; 37-45 significant >80%; 46-51 moderate >50%; 52-55 lower >25%). Falls risk discussed with pt.  Pt's cognition/memory for family facts is poor.  This AM, he reported that both of his daughters are child psychologists; this afternoon, he stated he has a daughter who is an Arboriculturist ( has MS, runs marathons).   When returned to room, pt requested getting in bed to rest; min guard assist.  Bed alarm set and all needs placed within reach.  Therapy Documentation Precautions:  Precautions Precautions: Fall Precaution Comments: old R hemiparesis; new L weakness Restrictions Weight Bearing Restrictions: No Pain: Pain Assessment Pain Assessment: No/denies pain    Balance: Standardized Balance Assessment Standardized Balance Assessment: Berg Balance Test Berg Balance Test Sit to Stand: Able to stand without using hands and stabilize independently Standing Unsupported: Able to stand 2 minutes with supervision Sitting with Back Unsupported but Feet Supported on Floor or Stool: Able to sit safely and securely 2 minutes Stand to Sit: Sits safely with minimal use of hands Transfers: Able to transfer with verbal cueing and /or supervision Standing Unsupported with Eyes Closed: Able to stand 10 seconds with supervision Standing Ubsupported with Feet Together: Able to place feet together independently and stand for 1 minute with supervision From Standing, Reach Forward with Outstretched Arm: Reaches forward but needs supervision From Standing Position, Pick  up Object from Floor: Able to pick up shoe, needs supervision From Standing Position, Turn to Look Behind Over each Shoulder: Needs supervision when turning Turn 360 Degrees: Needs close supervision or verbal cueing Standing Unsupported, Alternately Place Feet on Step/Stool:  Needs assistance to keep from falling or unable to try Standing Unsupported, One Foot in Front: Able to take small step independently and hold 30 seconds Standing on One Leg: Tries to lift leg/unable to hold 3 seconds but remains standing independently Total Score: 32      See Function Navigator for Current Functional Status.   Therapy/Group: Individual Therapy  Alaysiah Browder 10/16/2015, 2:20 PM

## 2015-10-16 NOTE — Progress Notes (Signed)
[place] PHYSICAL MEDICINE & REHABILITATION     PROGRESS NOTE    Subjective/Complaints:  Pt seen this AM resting in bed.  Pt had a good day of therapies yesterday and is ready to being again today.   ROS: Denies CP, SOB, n/v/d.   Objective: Vital Signs: Blood pressure 150/82, pulse 79, temperature 98.5 F (36.9 C), temperature source Oral, resp. rate 18, height 5\' 10"  (1.778 m), weight 93.895 kg (207 lb), SpO2 92 %. No results found.  Recent Labs  10/14/15 1801 10/15/15 0529  WBC 8.1 8.6  HGB 15.2 15.1  HCT 44.7 45.1  PLT 174 166    Recent Labs  10/14/15 0655 10/14/15 1801 10/15/15 0529  NA 137  --  138  K 3.7  --  3.9  CL 103  --  105  GLUCOSE 143*  --  133*  BUN 10  --  12  CREATININE 0.69 0.73 0.76  CALCIUM 8.5*  --  8.9   CBG (last 3)   Recent Labs  10/15/15 1634 10/15/15 2141 10/16/15 0712  GLUCAP 117* 122* 128*    Wt Readings from Last 3 Encounters:  10/14/15 93.895 kg (207 lb)  10/12/15 93.441 kg (206 lb)  08/20/15 94.802 kg (209 lb)    Physical Exam:  BP 150/82 mmHg  Pulse 79  Temp(Src) 98.5 F (36.9 C) (Oral)  Resp 18  Ht 5\' 10"  (1.778 m)  Wt 93.895 kg (207 lb)  BMI 29.70 kg/m2  SpO2 92% Constitutional: He appears well-developed and well-nourished. Vital signs reviewed.  HENT: Normocephalic and atraumatic.  Eyes: Conjunctivae and EOM are normal.  Neck: Normal range of motion. No thyromegaly present.  Cardiovascular: Normal rate and regular rhythm.  Respiratory: Effort normal and breath sounds normal. No respiratory distress.  GI: Soft. Bowel sounds are normal. He exhibits no distension.  Musculoskeletal: He exhibits no edema or tenderness.  Neurological: He is alert and oriented.  Follows commands Sensation intact to light touch Motor: RUE: 4+/5 proximal to distal, flexor spasticity LUE: Shoulder abduction, elbow flexion and extension 4/5, hand grip 2+/5 B/L LE: 5/5  Skin: Skin is warm and dry.  Psychiatric: He has a  normal mood and affect. His behavior is normal  Assessment/Plan: 1. 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 Physiatrist is providing close team supervision and 24 hour management of active medical problems listed below. Physiatrist and rehab team continue to assess barriers to discharge/monitor patient progress toward functional and medical goals.  Function:  Bathing Bathing position   Position: Shower  Bathing parts Body parts bathed by patient: Right arm, Chest, Abdomen, Front perineal area, Right upper leg, Left upper leg Body parts bathed by helper: Right lower leg, Left lower leg, Back, Buttocks, Left arm  Bathing assist Assist Level: Touching or steadying assistance(Pt > 75%)      Upper Body Dressing/Undressing Upper body dressing   What is the patient wearing?: Pull over shirt/dress     Pull over shirt/dress - Perfomed by patient: Thread/unthread left sleeve, Put head through opening Pull over shirt/dress - Perfomed by helper: Thread/unthread right sleeve, Pull shirt over trunk        Upper body assist Assist Level: Touching or steadying assistance(Pt > 75%)      Lower Body Dressing/Undressing Lower body dressing   What is the patient wearing?: Underwear, Pants, Socks,  Shoes, [employer]  - Performed by patient: Thread/unthread right underwear leg, Thread/unthread left underwear leg Underwear - Performed by helper: Pull underwear up/down Pants- Performed by patient: Thread/unthread right pants leg, Thread/unthread left pants leg Pants- Performed by helper: Pull pants up/down       Socks - Performed by helper: Don/doff right sock, Don/doff left sock (n/a fastening (already has elastic shoe laces) Shoes - Performed by patient: Don/doff right shoe, Don/doff left  shoe         TED Hose - Performed by helper: Don/doff right TED hose, Don/doff left TED hose  Lower body assist Assist for lower body dressing: Touching or steadying assistance (Pt > 75%)      Toileting Toileting     Toileting steps completed by helper: Adjust clothing prior to toileting, Performs perineal hygiene, Adjust clothing after toileting    Toileting assist Assist level: Touching or steadying assistance (Pt.75%)   Transfers Chair/bed transfer     Chair/bed transfer assist level: Touching or steadying assistance (Pt > 75%) Chair/bed transfer assistive device: Armrests     Locomotion Ambulation     Max distance: 120     Wheelchair     Max wheelchair distance: 150 (gait to be primary means) Assist Level: Dependent (Pt equals 0%)  Cognition Comprehension Comprehension assist level: Understands complex 90% of the time/cues 10% of the time  Expression Expression assist level: Expresses complex 90% of the time/cues < 10% of the time  Social Interaction Social Interaction assist level: Interacts appropriately with others with medication or extra time (anti-anxiety, antidepressant).  Problem Solving Problem solving assist level: Solves complex 90% of the time/cues < 10% of the time  Memory Memory assist level: More than reasonable amount of time    Medical Problem List and Plan: 1. 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  Cont CIR 2. DVT Prophylaxis/Anticoagulation: Subcutaneous Lovenox. Monitor platelet counts for any signs of bleeding 3. Pain Management: Tylenol as needed 4. Mood: Celexa 10 mg daily. Provide emotional support 5. Neuropsych: This patient is capable of making decisions on his own behalf. 6. Skin/Wound Care: Routine skin checks 7. Fluids/Electrolytes/Nutrition: Routine I&O with follow-up chemistries 8. Hypertension. No  current antihypertensive medication. Patient on lisinopril 20 mg daily prior to admission. Will consider resuming  Bps labile, will cont to follow and add medications if persistently elevated in light of stroke 9. Diabetes mellitus with peripheral neuropathy. Hemoglobin A1c 6.9. Glucophage 1000 milligrams twice a day. Check blood sugars before meals and at bedtime. Diabetic teaching 10. Residual spasticity RUE: Will educate on stretching techniques and splinting and consider medications if necessary 11. Hypoalbuminemia: Will start protein supplementation and periodically monitor  LOS (Days) 2 A FACE TO FACE EVALUATION WAS PERFORMED  Folasade Mooty Lorie Phenix 10/16/2015 8:54 AM

## 2015-10-16 NOTE — Patient Care Conference (Signed)
Inpatient RehabilitationTeam Conference and Plan of Care Update Date: 10/16/2015   Time: 1:25 PM    Patient Name: Darrell [name]      Medical Record Number: [id number]  Date of Birth: 1946-02-06 Sex: Male         Room/Bed: 4M03C/4M03C-01 Payor Info: Payor: MEDICARE / Plan: MEDICARE PART A AND B / Product Type: *No Product type* /    Admitting Diagnosis: RT CVA  Admit Date/Time:  10/14/2015  4:06 PM Admission Comments: No comment available   Primary Diagnosis:  Hemiparesis affecting dominant side as late effect of stroke ([place]) Principal Problem: Hemiparesis affecting dominant side as late effect of stroke [facility])  Patient Active Problem List   Diagnosis Date Noted  . Hypoalbuminemia due to protein-calorie malnutrition ([place]) 10/15/2015  . Poorly controlled type 2 diabetes mellitus with peripheral neuropathy ([place]) 10/14/2015  . History of CVA with residual deficit 10/14/2015  . Hemiparesis affecting dominant side as late effect of stroke ([place]) 10/14/2015  . Depression 10/14/2015  . Basal ganglia infarction ([place]) 10/14/2015  . Acute ischemic stroke ([place])   . Left-sided weakness 10/12/2015  . Abnormal CXR 10/12/2015  . BPH associated with nocturia 08/20/2015  . Aftercare following surgery of the circulatory system, [place] 02/13/2014  . Postop check 02/06/2014  . Left inguinal hernia 12/28/2013  . Rib fractures 10/13/2013  . Fall 10/13/2013  . Dysplastic nevus of trunk 02/14/2013  . Bicipital tendinitis of right shoulder 07/12/2012  . Subacromial bursitis, right 03/28/2012  . Right spastic hemiparesis ([place]) 02/02/2012  . Occlusion and stenosis of carotid artery with cerebral infarction 01/26/2012  . Hemiplegia affecting right dominant side ([place]) 12/24/2011  . CVA (cerebral infarction) 12/15/2011  . Occlusion and stenosis of carotid artery without mention of cerebral infarction 12/08/2011  . Carotid artery plaque 12/04/2011  . Left carotid bruit 11/30/2011  . AUTONOMIC  NEUROPATHY, DIABETIC 11/25/2010  . Hyperlipidemia 03/15/2008  . Obstructive sleep apnea 07/27/2007  . Diabetes 1.5, managed as type 2 ([place]) 06/15/2007  . Essential hypertension 06/15/2007    Expected Discharge Date: Expected Discharge Date: 10/29/15  Team Members Present: Physician leading conference: Dr. Delice Lesch Social Worker Present: Ovidio Kin, LCSW Nurse Present: Dorien Chihuahua, RN PT Present: Georjean Mode, PT OT Present: Willeen Cass, OT SLP Present: Windell Moulding, SLP PPS Coordinator present : Daiva Nakayama, RN, CRRN     Current Status/Progress Goal Weekly Team Focus  Medical   Left hemiplegia/dysarthria with gait deficits secondary to acute hemorrhagic right CVA as well as remote infarcts   Stabalize BP, educate with adaptive techniques, family education  see above   Bowel/Bladder   Continent of bowel and bladder. LBM 10/13/15  Pt to remain continent of bowel and bladder  Monitor   Swallow/Nutrition/ Hydration     na        ADL's   min A for UB B/D; mod A for LB B/D; Brunstrom IV for left UE, min A functional ambulation and transfers  supervision overall  transfers, activity tolerance, functional ambiulation, standing tolerance and balance, functional use/ NMR of left UE   Mobility   min assist transfers and gait x 150' and 8 low steps  S overall; min stairs  neuro re-ed, balance, pt and family ed, gait, activity tolerance   Communication     na        Safety/Cognition/ Behavioral Observations    no unsafe behaviors        Pain   No c/o pain  <3  Monitor  for nonverbal cues of pain   Skin   Buttocks, pink, blanchable  No additional skin breakdow  Assist to turn q 2hrs      *See Care Plan and progress notes for long and short-term goals.  Barriers to Discharge: labile BP, safety awareness, family edu    Possible Resolutions to Barriers:  optimize meds, consider medical for spasticity, pt and family edu    Discharge Planning/Teaching Needs:  Home with  wife who has provided care for pt before after last stroke. Both familiar with this program since has been here before.      Team Discussion:  Goals supervision le le for safety-some cognitive issues-delayed processing. No Speech seeing. Min assist with lower body bathing and dressing. Doing well in therapies. Elbow contracture-MD addressing  Revisions to Treatment Plan:  None   Continued Need for Acute Rehabilitation Level of Care: The patient requires daily medical management by a physician with specialized training in physical medicine and rehabilitation for the following conditions: Daily direction of a multidisciplinary physical rehabilitation program to ensure safe treatment while eliciting the highest outcome that is of practical value to the patient.: Yes Daily medical management of patient stability for increased activity during participation in an intensive rehabilitation regime.: Yes Daily analysis of laboratory values and/or radiology reports with any subsequent need for medication adjustment of medical intervention for : Neurological problems  Viviann Broyles, Gardiner Rhyme 10/17/2015, 8:06 AM

## 2015-10-17 ENCOUNTER — Inpatient Hospital Stay (HOSPITAL_COMMUNITY): Payer: Medicare Other

## 2015-10-17 ENCOUNTER — Inpatient Hospital Stay (HOSPITAL_COMMUNITY): Payer: Medicare Other | Admitting: Occupational Therapy

## 2015-10-17 DIAGNOSIS — I639 Cerebral infarction, unspecified: Secondary | ICD-10-CM

## 2015-10-17 LAB — URINE CULTURE: Culture: 9000

## 2015-10-17 LAB — GLUCOSE, CAPILLARY
GLUCOSE-CAPILLARY: 114 mg/dL — AB (ref 65–99)
GLUCOSE-CAPILLARY: 101 mg/dL — AB (ref 65–99)
Glucose-Capillary: 105 mg/dL — ABNORMAL HIGH (ref 65–99)
Glucose-Capillary: 113 mg/dL — ABNORMAL HIGH (ref 65–99)

## 2015-10-17 MED ORDER — BACLOFEN 10 MG PO TABS
5.0000 mg | ORAL_TABLET | Freq: Three times a day (TID) | ORAL | Status: DC
  Filled 2015-10-17 (×2): qty 0.5

## 2015-10-17 MED ORDER — BACLOFEN 5 MG HALF TABLET
5.0000 mg | ORAL_TABLET | Freq: Three times a day (TID) | ORAL | Status: DC
  Administered 2015-10-17 – 2015-10-18 (×3): 5 mg via ORAL
  Filled 2015-10-17 (×2): qty 1

## 2015-10-17 MED ORDER — BACLOFEN 10 MG PO TABS: 5.0000 mg | ORAL_TABLET | Freq: Three times a day (TID) | ORAL | Status: DC

## 2015-10-17 NOTE — Progress Notes (Signed)
Occupational Therapy Session Note  Patient Details  Name: Darrell [name] MRN: [id number] Date of Birth: 1946/04/21  Today's Date: 10/17/2015 OT Individual Time: 0930-1030 OT Individual Time Calculation (min): 60 min    Short Term Goals: Week 1:  OT Short Term Goal 1 (Week 1): Pt will transfer into shower stall with seat with min A  OT Short Term Goal 2 (Week 1): Pt will perform toileting steps with mod A  OT Short Term Goal 3 (Week 1): Pt will don shirt with setup  OT Short Term Goal 4 (Week 1): Pt will perform 2 grooming tasks in standing with supervision  OT Short Term Goal 5 (Week 1): Pt will perform bed mobility with supervision  Skilled Therapeutic Interventions/Progress Updates:    1:1 self care retraining at shower level with focus on functional ambulation without AD, standing balance, left UE strength and coordination for manipulating self  Care tools and clothing, activity tolerance. Pt dressed sitting EOB with focus on activity tolerance, promotion of trunk rotation, frustration tolerance, and left UE coordination and use of right hand as stabilizer. Pt easily frustrated with left hand today; wanting to give in and accept help however able to continue to encourage patient to complete tasks with slight assistance (especially for socks). Pt requires max cuing to use right hand as stabilize to open to containers and assist with manipulating clothing.   Focused on functional ambulation without AD from room to therapy gym with steadying A with mod verbal and tactile cues for increased left hip flexion; so not to drag left foot. In gym, continued to focus on trunk rotation and hip flexibility. Performed exercises in supine promoting hip rotation and stretch on hamstrings and IT band to increased ability to don socks in ADL performance. Transitioned in sitting and continued to work on elongating left side while shorten right side with removing cards with left hand that were placed  underneath right hip.  Pt required min to mod manual facilitation to achieve. Return to room and into w/c.  Therapy Documentation Precautions:  Precautions Precautions: Fall Precaution Comments: old R hemiparesis; new L weakness Restrictions Weight Bearing Restrictions: No Pain:  no c/o pain in session   See Function Navigator for Current Functional Status.   Therapy/Group: Individual Therapy  Willeen Cass [facility] 10/17/2015, 10:28 AM

## 2015-10-17 NOTE — Plan of Care (Signed)
Problem: RH PAIN MANAGEMENT Goal: RH STG PAIN MANAGED AT OR BELOW PT'S PAIN GOAL <3 Outcome: Progressing No c/o pain     

## 2015-10-17 NOTE — Progress Notes (Signed)
Physical Therapy Session Note  Patient Details  Name: Darrell [name] MRN: [id number] Date of Birth: 1946/04/21  Today's Date: 10/17/2015 PT Individual Time: 1400-1515 PT Individual Time Calculation (min): 75 min  PT concurrent time: 1515-1545, 30 min  Short Term Goals: Week 1:  PT Short Term Goal 1 (Week 1): Pt will be able to gait with overall min assist x 150' PT Short Term Goal 2 (Week 1): Pt will be able to demonstrate dynamic standing balance with min assist consistently PT Short Term Goal 3 (Week 1): Pt will be able to perform basic transfers with supervision  Skilled Therapeutic Interventions/Progress Updates:  Individual tx: gait in room to toilet and sink with close supervision; gait in hallway x 150' with close supervision>min guard assist, mod VCS to slow down due to scissoring RLE as he fatigues. 10 mWT 2.73'/second. Advanced gait using floor agility ladder, x 3 forward, L/R laterally. RLE jerky motions noted as pt fatigued.  neuromuscular re-education via forced use, demo, VCS for self stretching bil hamstrings and heel cords, x 30 seconds x 3 in sitting and in standing on wedge x 3 minutes with extended LEs and x 3 minutes in slight squat; seated bil hip adduction against resistance with feet wide apart to activate core; [place] A exs with 3# LLE, 2#RLE for seated long arc quad knee ext and standing hip abduction, no wts for hamstring curls or mini squats, heel and toe raises, x 8-10 each.  R lateral step- ups onto 4" high step with LUE support, focusing on full knee ext and hip extension. Pt's R knee fully extends, but hip stays flexed due to tight HS.    Pt with no memory of set-up from yesterday for self stretching LE muscles.   Concurrent tx: Sustained R hip flexor stretch in lying supine with RLE suspended hanging. R unilateral bridging without use of UEs focusing on R hip extension past neutral.   Bed mobility on flat firm mat sitting up on R side, as per home  situation, per pt, requires assistance to bring wt forward onto UEs and elevate trunk. Pt needed max cues for sequencing.Gait to return to room, as above.  Pt exhausted and requested getting into bed.  Close supervision and max cues for optimal placement of hips on bed. Bed alarm set and all needs within reach.    Therapy Documentation Precautions:  Precautions Precautions: Fall Precaution Comments: old R hemiparesis; new L weakness Restrictions Weight Bearing Restrictions: No Pain: Pain Assessment Pain Assessment: No/denies pain Locomotion : Gait Gait velocity: 10MWT = 2.73'/second       See Function Navigator for Current Functional Status.   Therapy/Group: Individual Therapy  [name] 10/17/2015, 4:36 PM

## 2015-10-17 NOTE — Progress Notes (Signed)
Occupational Therapy Note  Patient Details  Name: Darrell [name] MRN: [id number] Date of Birth: 1946-03-30  Today's Date: 10/17/2015 OT Individual Time: 1130-1200 OT Individual Time Calculation (min): 30 min   Pt denied pain Individual Therapy  Pt amb with HHA (steady A) from room to therapy gym and sat EOM to engage in LUE [facility] tasks (removing beads from theraputty, stacking tokens, placing and removing small pegs from peg board). Pt completed all tasks with extra time.  Pt noted with limited pronation/supination.  Encouraged patient to continue similar activities in room to increase function with LUE for BADLs. Pt returned to room and requested use of bathroom.  Pt remained on toilet with instructions to call staff when ready.  Staff notified.    Leotis Shames [facility] 10/17/2015, 12:08 PM

## 2015-10-17 NOTE — Progress Notes (Signed)
Social Work Elease Hashimoto, LCSW Social Worker Signed  Patient Care Conference 10/16/2015  3:03 PM    Expand All Collapse All   Inpatient RehabilitationTeam Conference and Plan of Care Update Date: 10/16/2015   Time: 1:25 PM     Patient Name: Darrell [name]      Medical Record Number: [id number]  Date of Birth: [birth year] Sex: Male         Room/Bed: 4M03C/4M03C-01 Payor Info: Payor: MEDICARE / Plan: MEDICARE PART A AND B / Product Type: *No Product type* /    Admitting Diagnosis: RT CVA   Admit Date/Time:  10/14/2015  4:06 PM Admission Comments: No comment available   Primary Diagnosis:  Hemiparesis affecting dominant side as late effect of stroke ([place]) Principal Problem: Hemiparesis affecting dominant side as late effect of stroke [facility])    Patient Active Problem List     Diagnosis  Date Noted   .  Hypoalbuminemia due to protein-calorie malnutrition ([place])  10/15/2015   .  Poorly controlled type 2 diabetes mellitus with peripheral neuropathy ([place])  10/14/2015   .  History of CVA with residual deficit  10/14/2015   .  Hemiparesis affecting dominant side as late effect of stroke ([place])  10/14/2015   .  Depression  10/14/2015   .  Basal ganglia infarction ([place])  10/14/2015   .  Acute ischemic stroke ([place])     .  Left-sided weakness  10/12/2015   .  Abnormal CXR  10/12/2015   .  BPH associated with nocturia  08/20/2015   .  Aftercare following surgery of the circulatory system, [place]  02/13/2014   .  Postop check  02/06/2014   .  Left inguinal hernia  12/28/2013   .  Rib fractures  10/13/2013   .  Fall  10/13/2013   .  Dysplastic nevus of trunk  02/14/2013   .  Bicipital tendinitis of right shoulder  07/12/2012   .  Subacromial bursitis, right  03/28/2012   .  Right spastic hemiparesis ([place])  02/02/2012   .  Occlusion and stenosis of carotid artery with cerebral infarction  01/26/2012   .  Hemiplegia affecting right dominant side ([place])  12/24/2011   .  CVA (cerebral  infarction)  12/15/2011   .  Occlusion and stenosis of carotid artery without mention of cerebral infarction  12/08/2011   .  Carotid artery plaque  12/04/2011   .  Left carotid bruit  11/30/2011   .  AUTONOMIC NEUROPATHY, DIABETIC  11/25/2010   .  Hyperlipidemia  03/15/2008   .  Obstructive sleep apnea  07/27/2007   .  Diabetes 1.5, managed as type 2 ([place])  06/15/2007   .  Essential hypertension  06/15/2007     Expected Discharge Date: Expected Discharge Date: 10/29/15  Team Members Present: Physician leading conference: Dr. Delice Lesch Social Worker Present: Ovidio Kin, LCSW Nurse Present: Dorien Chihuahua, RN PT Present: Georjean Mode, PT OT Present: Willeen Cass, OT SLP Present: Windell Moulding, SLP PPS Coordinator present : Daiva Nakayama, RN, CRRN        Current Status/Progress  Goal  Weekly Team Focus   Medical     Left hemiplegia/dysarthria with gait deficits secondary to acute hemorrhagic right CVA as well as remote infarcts   Stabalize BP, educate with adaptive techniques, family education   see above   Bowel/Bladder     Continent of bowel and bladder. LBM 10/13/15   Pt to remain continent of bowel  and bladder   Monitor   Swallow/Nutrition/ Hydration       na         ADL's     min A for UB B/D; mod A for LB B/D; Brunstrom IV for left UE, min A functional ambulation and transfers  supervision overall  transfers, activity tolerance, functional ambiulation, standing tolerance and balance, functional use/ NMR of left UE    Mobility     min assist transfers and gait x 150' and 8 low steps   S overall; min stairs  neuro re-ed, balance, pt and family ed, gait, activity tolerance    Communication       na         Safety/Cognition/ Behavioral Observations      no unsafe behaviors         Pain     No c/o pain  <3  Monitor for nonverbal cues of pain    Skin     Buttocks, pink, blanchable  No additional skin breakdow  Assist to turn q 2hrs      *See Care Plan and progress  notes for long and short-term goals.    Barriers to Discharge:  labile BP, safety awareness, family edu      Possible Resolutions to Barriers:   optimize meds, consider medical for spasticity, pt and family edu     Discharge Planning/Teaching Needs:   Home with wife who has provided care for pt before after last stroke. Both familiar with this program since has been here before.        Team Discussion:    Goals supervision le le for safety-some cognitive issues-delayed processing. No Speech seeing. Min assist with lower body bathing and dressing. Doing well in therapies. Elbow contracture-MD addressing   Revisions to Treatment Plan:    None    Continued Need for Acute Rehabilitation Level of Care: The patient requires daily medical management by a physician with specialized training in physical medicine and rehabilitation for the following conditions: Daily direction of a multidisciplinary physical rehabilitation program to ensure safe treatment while eliciting the highest outcome that is of practical value to the patient.: Yes Daily medical management of patient stability for increased activity during participation in an intensive rehabilitation regime.: Yes Daily analysis of laboratory values and/or radiology reports with any subsequent need for medication adjustment of medical intervention for : Neurological problems  Elease Hashimoto 10/17/2015, 8:06 AM                  Patient ID: Darrell [name], male   DOB: 08-15-1946, 69 y.o.   MRN: [id number]

## 2015-10-17 NOTE — Progress Notes (Signed)
[name] PHYSICAL MEDICINE & REHABILITATION     PROGRESS NOTE    Subjective/Complaints:  Pt seen this AM resting in bed.  Pt is enjoying therapies and is looking forward to starting again.    ROS: Denies CP, SOB, n/v/d.   Objective: Vital Signs: Blood pressure 124/69, pulse 78, temperature 98.4 F (36.9 C), temperature source Oral, resp. rate 16, height 5\' 10"  (1.778 m), weight 94.1 kg (207 lb 7.3 oz), SpO2 97 %. No results found.  Recent Labs  10/14/15 1801 10/15/15 0529  WBC 8.1 8.6  HGB 15.2 15.1  HCT 44.7 45.1  PLT 174 166    Recent Labs  10/14/15 1801 10/15/15 0529  NA  --  138  K  --  3.9  CL  --  105  GLUCOSE  --  133*  BUN  --  12  CREATININE 0.73 0.76  CALCIUM  --  8.9   CBG (last 3)   Recent Labs  10/16/15 1717 10/16/15 2112 10/17/15 0647  GLUCAP 103* 165* 114*    Wt Readings from Last 3 Encounters:  10/16/15 94.1 kg (207 lb 7.3 oz)  10/12/15 93.441 kg (206 lb)  08/20/15 94.802 kg (209 lb)    Physical Exam:  BP 124/69 mmHg  Pulse 78  Temp(Src) 98.4 F (36.9 C) (Oral)  Resp 16  Ht 5\' 10"  (1.778 m)  Wt 94.1 kg (207 lb 7.3 oz)  BMI 29.77 kg/m2  SpO2 97% Constitutional: He appears well-developed and well-nourished. Vital signs reviewed.  HENT: Normocephalic and atraumatic.  Eyes: Conjunctivae and EOM are normal.  Neck: Normal range of motion. No thyromegaly present.  Cardiovascular: Normal rate and regular rhythm.  Respiratory: Effort normal and breath sounds normal. No respiratory distress.  GI: Soft. Bowel sounds are normal. He exhibits no distension.  Musculoskeletal: He exhibits no edema or tenderness.  Flexion contracture right elbow ~75 deg. Neurological: He is alert and oriented.  Follows commands Sensation intact to light touch Motor: RUE: 4+/5 proximal to distal, flexor spasticity LUE: Shoulder abduction, elbow flexion and extension 4/5, hand grip 2+/5 B/L LE: 4/5  Skin: Skin is warm and dry.  Psychiatric: He has  a normal mood and affect. His behavior is normal  Assessment/Plan: 1. 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 Physiatrist is providing close team supervision and 24 hour management of active medical problems listed below. Physiatrist and rehab team continue to assess barriers to discharge/monitor patient progress toward functional and medical goals.  Function:  Bathing Bathing position   Position: Shower  Bathing parts Body parts bathed by patient: Right arm, Chest, Abdomen, Front perineal area, Right upper leg, Left upper leg, Left arm, Buttocks, Right lower leg, Left lower leg Body parts bathed by helper: Back  Bathing assist Assist Level: Touching or steadying assistance(Pt > 75%)      Upper Body Dressing/Undressing Upper body dressing   What is the patient wearing?: Pull over shirt/dress     Pull over shirt/dress - Perfomed by patient: Thread/unthread left sleeve, Put head through opening, Thread/unthread right sleeve Pull over shirt/dress - Perfomed by helper: Pull shirt over trunk        Upper body assist Assist Level: Touching or steadying assistance(Pt > 75%)      Lower Body Dressing/Undressing Lower body dressing   What is the patient wearing?: Underwear, Pants, Socks,  Shoes, [employer]  - Performed by patient: Thread/unthread right underwear leg, Thread/unthread left underwear leg Underwear - Performed by helper: Pull underwear up/down Pants- Performed by patient: Thread/unthread right pants leg, Thread/unthread left pants leg, Pull pants up/down Pants- Performed by helper: Pull pants up/down       Socks - Performed by helper: Don/doff right sock, Don/doff left sock Shoes - Performed by patient: Don/doff right shoe, Don/doff left shoe         TED Hose -  Performed by helper: Don/doff right TED hose, Don/doff left TED hose  Lower body assist Assist for lower body dressing: Touching or steadying assistance (Pt > 75%)      Toileting Toileting   Toileting steps completed by patient: Adjust clothing prior to toileting, Adjust clothing after toileting Toileting steps completed by helper: Adjust clothing prior to toileting, Performs perineal hygiene, Adjust clothing after toileting Toileting Assistive Devices: Grab bar or rail  Toileting assist Assist level: Touching or steadying assistance (Pt.75%)   Transfers Chair/bed transfer     Chair/bed transfer assist level: Touching or steadying assistance (Pt > 75%) Chair/bed transfer assistive device: Armrests     Locomotion Ambulation     Max distance: 120     Wheelchair     Max wheelchair distance: 120 Assist Level: No help, No cues, assistive device, takes more than reasonable amount of time  Cognition Comprehension Comprehension assist level: Follows basic conversation/direction with extra time/assistive device  Expression Expression assist level: Expresses basic 90% of the time/requires cueing < 10% of the time.  Social Interaction Social Interaction assist level: Interacts appropriately with others with medication or extra time (anti-anxiety, antidepressant).  Problem Solving Problem solving assist level: Solves basic 90% of the time/requires cueing < 10% of the time  Memory Memory assist level: Recognizes or recalls 75 - 89% of the time/requires cueing 10 - 24% of the time    Medical Problem List and Plan: 1. 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  Cont CIR 2. DVT Prophylaxis/Anticoagulation: Subcutaneous Lovenox. Monitor platelet counts for any signs of bleeding 3. Pain Management: Tylenol as needed 4. Mood: Celexa 10 mg daily. Provide emotional  support 5. Neuropsych: This patient is capable of making decisions on his own behalf. 6. Skin/Wound Care: Routine skin checks 7. Fluids/Electrolytes/Nutrition: Routine I&O with follow-up chemistries 8. Hypertension. No current antihypertensive medication. Patient on lisinopril 20 mg daily prior to admission.   BPs labile, will cont to follow and add medications if persistently elevated in light of stroke 9. Diabetes mellitus with peripheral neuropathy. Hemoglobin A1c 6.9. Glucophage 1000 milligrams twice a day. Check blood sugars before meals and at bedtime. Diabetic teaching  Within acceptable range 10. Residual spasticity RUE: Will educate on stretching techniques and splinting and consider medications if necessary  Pt states he has tried botox without relief.  Will start pt on baclofen (has never tried) and monitor 11. Hypoalbuminemia: Will start protein supplementation and periodically monitor  LOS (Days) 3 A FACE TO FACE EVALUATION WAS PERFORMED  Ankit Lorie Phenix 10/17/2015 8:37 AM

## 2015-10-18 ENCOUNTER — Inpatient Hospital Stay (HOSPITAL_COMMUNITY): Payer: Medicare Other | Admitting: Occupational Therapy

## 2015-10-18 ENCOUNTER — Inpatient Hospital Stay (HOSPITAL_COMMUNITY): Payer: Medicare Other | Admitting: Physical Therapy

## 2015-10-18 DIAGNOSIS — E119 Type 2 diabetes mellitus without complications: Secondary | ICD-10-CM | POA: Insufficient documentation

## 2015-10-18 LAB — GLUCOSE, CAPILLARY
GLUCOSE-CAPILLARY: 122 mg/dL — AB (ref 65–99)
GLUCOSE-CAPILLARY: 86 mg/dL (ref 65–99)
Glucose-Capillary: 107 mg/dL — ABNORMAL HIGH (ref 65–99)
Glucose-Capillary: 128 mg/dL — ABNORMAL HIGH (ref 65–99)

## 2015-10-18 MED ORDER — BACLOFEN 10 MG PO TABS
5.0000 mg | ORAL_TABLET | Freq: Three times a day (TID) | ORAL | Status: DC
  Administered 2015-10-18 – 2015-10-20 (×7): 5 mg via ORAL
  Filled 2015-10-18: qty 0.5
  Filled 2015-10-18: qty 1
  Filled 2015-10-18 (×6): qty 0.5
  Filled 2015-10-18 (×3): qty 1
  Filled 2015-10-18 (×2): qty 0.5
  Filled 2015-10-18: qty 1
  Filled 2015-10-18 (×2): qty 0.5

## 2015-10-18 NOTE — Progress Notes (Addendum)
[place] [facility] PHYSICAL MEDICINE & REHABILITATION     PROGRESS NOTE    Subjective/Complaints:  Patient seen this morning sitting up in bed. He said he is exhausted from therapy yesterday, but will be ready to go again today.  He feels he is getting stronger.  ROS: Denies CP, SOB, n/v/d.   Objective: Vital Signs: Blood pressure 126/75, pulse 71, temperature 98.1 F (36.7 C), temperature source Oral, resp. rate 20, height 5\' 10"  (1.778 m), weight 94.1 kg (207 lb 7.3 oz), SpO2 93 %. No results found. No results for input(s): WBC, HGB, HCT, PLT in the last 72 hours. No results for input(s): NA, K, CL, GLUCOSE, BUN, CREATININE, CALCIUM in the last 72 hours.  Invalid input(s): CO CBG (last 3)   Recent Labs  10/17/15 1608 10/17/15 2127 10/18/15 0640  GLUCAP 113* 101* 107*    Wt Readings from Last 3 Encounters:  10/16/15 94.1 kg (207 lb 7.3 oz)  10/12/15 93.441 kg (206 lb)  08/20/15 94.802 kg (209 lb)    Physical Exam:  BP 126/75 mmHg  Pulse 71  Temp(Src) 98.1 F (36.7 C) (Oral)  Resp 20  Ht 5\' 10"  (1.778 m)  Wt 94.1 kg (207 lb 7.3 oz)  BMI 29.77 kg/m2  SpO2 93% Constitutional: He appears well-developed and well-nourished. Vital signs reviewed.  HENT: Normocephalic and atraumatic.  Eyes: Conjunctivae and EOM are normal.  Neck: Normal range of motion. No thyromegaly present.  Cardiovascular: Normal rate and regular rhythm.  Respiratory: Effort normal and breath sounds normal. No respiratory distress.  GI: Soft. Bowel sounds are normal. He exhibits no distension.  Musculoskeletal: He exhibits no edema or tenderness.  Flexion contracture right elbow ~75 deg. Neurological: He is alert and oriented.  Follows commands Sensation intact to light touch Motor: RUE: 4+/5 proximal to distal, flexor spasticity LUE: Shoulder abduction, elbow flexion and extension 4/5, hand grip 3+/5 B/L LE: 4/5  Skin: Skin is warm and dry.  Psychiatric: He has a normal mood and affect. His  behavior is normal  Assessment/Plan: 1. 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 Physiatrist is providing close team supervision and 24 hour management of active medical problems listed below. Physiatrist and rehab team continue to assess barriers to discharge/monitor patient progress toward functional and medical goals.  Function:  Bathing Bathing position   Position: Shower  Bathing parts Body parts bathed by patient: Right arm, Chest, Abdomen, Front perineal area, Right upper leg, Left upper leg, Left arm, Buttocks, Right lower leg, Left lower leg Body parts bathed by helper: Back  Bathing assist Assist Level: Touching or steadying assistance(Pt > 75%)      Upper Body Dressing/Undressing Upper body dressing   What is the patient wearing?: Pull over shirt/dress     Pull over shirt/dress - Perfomed by patient: Thread/unthread left sleeve, Put head through opening, Thread/unthread right sleeve Pull over shirt/dress - Perfomed by helper: Pull shirt over trunk        Upper body assist Assist Level: Touching or steadying assistance(Pt > 75%)      Lower Body Dressing/Undressing Lower body dressing   What is the patient wearing?: Underwear, Pants, Socks, Shoes, [employer]  - Performed by patient: Thread/unthread right underwear leg, Thread/unthread left underwear leg Underwear - Performed by helper: Pull underwear up/down Pants- Performed by patient: Thread/unthread right pants leg, Thread/unthread  left pants leg, Pull pants up/down Pants- Performed by helper: Pull pants up/down       Socks - Performed by helper: Don/doff right sock, Don/doff left sock Shoes - Performed by patient: Don/doff right shoe, Don/doff left shoe         TED Hose - Performed by helper: Don/doff  right TED hose, Don/doff left TED hose  Lower body assist Assist for lower body dressing: Touching or steadying assistance (Pt > 75%)      Toileting Toileting   Toileting steps completed by patient: Adjust clothing prior to toileting, Adjust clothing after toileting Toileting steps completed by helper: Adjust clothing prior to toileting, Performs perineal hygiene, Adjust clothing after toileting Toileting Assistive Devices: Grab bar or rail  Toileting assist Assist level: Touching or steadying assistance (Pt.75%)   Transfers Chair/bed transfer     Chair/bed transfer assist level: Supervision or verbal cues Chair/bed transfer assistive device: Armrests     Locomotion Ambulation     Max distance: 150 Assist level: Touching or steadying assistance (Pt > 75%)   Wheelchair     Max wheelchair distance: 120 Assist Level: No help, No cues, assistive device, takes more than reasonable amount of time  Cognition Comprehension Comprehension assist level: Follows basic conversation/direction with extra time/assistive device  Expression Expression assist level: Expresses basic 90% of the time/requires cueing < 10% of the time.  Social Interaction Social Interaction assist level: Interacts appropriately with others with medication or extra time (anti-anxiety, antidepressant).  Problem Solving Problem solving assist level: Solves basic 50 - 74% of the time/requires cueing 25 - 49% of the time  Memory Memory assist level: Recognizes or recalls less than 25% of the time/requires cueing greater than 75% of the time    Medical Problem List and Plan: 1. 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  Cont CIR 2. DVT Prophylaxis/Anticoagulation: Subcutaneous Lovenox. Monitor platelet counts for any signs of bleeding 3. Pain Management: Tylenol as needed 4. Mood: Celexa 10 mg  daily. Provide emotional support 5. Neuropsych: This patient is capable of making decisions on his own behalf. 6. Skin/Wound Care: Routine skin checks 7. Fluids/Electrolytes/Nutrition: Routine I&O with follow-up chemistries 8. Hypertension. No current antihypertensive medication. Patient on lisinopril 20 mg daily prior to admission.   Stable over the last 24 hours 9. Diabetes mellitus with peripheral neuropathy. Hemoglobin A1c 6.9. Glucophage 1000 milligrams twice a day. Check blood sugars before meals and at bedtime. Diabetic teaching  Within acceptable range over the last 24 hours, fasting CBG 107 this AM 10. Residual spasticity RUE: Will educate on stretching techniques and splinting and consider medications if necessary  Pt states he has tried botox without relief.  Started on baclofen on 12/22. Will consider increase on 12/26. 11. Hypoalbuminemia: Will start protein supplementation and periodically monitor  LOS (Days) 4 A FACE TO FACE EVALUATION WAS PERFORMED  Sherril Shipman Lorie Phenix 10/18/2015 9:28 AM

## 2015-10-18 NOTE — Progress Notes (Addendum)
Physical Therapy Note  Patient Details  Name: Darrell [name] MRN: [id number] Date of Birth: [birth year] Today's Date: 10/18/2015    Time: 728-823 55 minutes  1:1 no c/o pain. Pt min A with gait in room without AD, min A for balance with clothing negotiation after toileting.  Pt gait in controlled environment multiple reps with min A with focus on decreasing R LE scissoring, R foot clearance, increased gait speed and efficiency. Pt improved with multiple reps over tile and carpet with min A throughout.  Gait sideways and backwards with focus on balance reactions, pt requires min A.  Bed mobility on firm flat surface with pt getting out on Rt side of bed. Pt initially mod A, improved to min A with repetition, continues to require max cuing for sequencing. Pt states he feels more fatigued this session, requires frequent rest breaks.   Time 2: 1400-1450 50 minutes  1:1 No c/o pain. Gait much improved this afternoon, close supervision in controlled environment, min A with gait in home environment and when fatigued.  Furniture transfers with min A to rise from low, soft couch.  Educated pt on choosing higher, firm surfaces for sitting at home.  OTAG[place] A exercises performed with 2# R LE, 3# LLE 10x bilat. Pt's wife given handout and she states she understands HEP.  Standing lateral and forward step ups with R LE for strengthening, stair negotiation with cues for safety and technique.   Darrell [name] 10/18/2015, 8:23 AM

## 2015-10-18 NOTE — Progress Notes (Signed)
Occupational Therapy Session Note  Patient Details  Name: Darrell [name] MRN: [id number] Date of Birth: 1946/09/22  Today's Date: 10/18/2015 OT Individual Time:  - 1000-1100  (60 min)  1st session                                                Short Term Goals: Week 1:  OT Short Term Goal 1 (Week 1): Pt will transfer into shower stall with seat with min A  OT Short Term Goal 2 (Week 1): Pt will perform toileting steps with mod A  OT Short Term Goal 3 (Week 1): Pt will don shirt with setup  OT Short Term Goal 4 (Week 1): Pt will perform 2 grooming tasks in standing with supervision  OT Short Term Goal 5 (Week 1): Pt will perform bed mobility with supervision     Skilled Therapeutic Interventions/Progress Updates:   1st session:   Pt lying in bed agreed to shower.  LUE moving nicely , but RUE 2012 CVA.  Ambulated to shower and showered with min assist for balance.  Pt. Ambulated out to dress at wc level.  Needed cues for upright posture and min assist for dynamic balance.  Left in wc with all needs in place  2nd session:  Time:1300-1330   (30 min) Pain: none Individual session Pt ambulated from room to family room with max cues for heel toe strategy   Pt needed max cues for technique.  Prepared cup of coffee and ambulated with coffee to table.  He did better holding the cup and better balance with walking than he did without the cup.  He demonstrated better heel toe movements.  Ambulated back to room and left with all needs in reach.    Therapy Documentation Precautions:  Precautions Precautions: Fall Precaution Comments: old R hemiparesis; new L weakness Restrictions Weight Bearing Restrictions: No    Pain: Pain Assessment Pain Assessment: No/denies pain      See Function Navigator for Current Functional Status.   Therapy/Group: Individual Therapy  Lisa Roca 10/18/2015, 10:15 AM

## 2015-10-19 ENCOUNTER — Inpatient Hospital Stay (HOSPITAL_COMMUNITY): Payer: Medicare Other | Admitting: Physical Therapy

## 2015-10-19 ENCOUNTER — Inpatient Hospital Stay (HOSPITAL_COMMUNITY): Payer: Medicare Other | Admitting: Occupational Therapy

## 2015-10-19 LAB — GLUCOSE, CAPILLARY
Glucose-Capillary: 109 mg/dL — ABNORMAL HIGH (ref 65–99)
Glucose-Capillary: 108 mg/dL — ABNORMAL HIGH (ref 65–99)
Glucose-Capillary: 118 mg/dL — ABNORMAL HIGH (ref 65–99)
Glucose-Capillary: 97 mg/dL (ref 65–99)

## 2015-10-19 MED ORDER — LISINOPRIL 5 MG PO TABS
5.0000 mg | ORAL_TABLET | Freq: Every day | ORAL | Status: DC
  Administered 2015-10-19 – 2015-10-21 (×3): 5 mg via ORAL
  Filled 2015-10-19 (×2): qty 1

## 2015-10-19 NOTE — Progress Notes (Signed)
Physical Therapy Session Note  Patient Details  Name: Darrell [name] MRN: [id number] Date of Birth: 04-06-1946  Today's Date: 10/19/2015 PT Individual Time: 1300-1400 PT Individual Time Calculation (min): 60 min   Short Term Goals: Week 1:  PT Short Term Goal 1 (Week 1): Pt will be able to gait with overall min assist x 150' PT Short Term Goal 2 (Week 1): Pt will be able to demonstrate dynamic standing balance with min assist consistently PT Short Term Goal 3 (Week 1): Pt will be able to perform basic transfers with supervision  Skilled Therapeutic Interventions/Progress Updates:    Pt received up in w/c, agreeable to PT session. Neuromuscular Reeducation - PT administers Functional Gait Assessment and pt scores 15/30 points (see below for details). Pt demonstrates good gross motor grip in L UE, but reports difficulty with fine motor control. PT instructs pt in L UE fine motor activity of removing beads from yellow theraputty with only L hand for practice of within hand manipulation. PT uses fine motor box and has pt practice unimanual (L hand) un-buttoning & buttoning of large & small buttons with PT stabilizing box req increased time. Pt demonstrates ability to unbutton all large and small buttons, but only able to button 2 large buttons with increased time and effort before pt becomes frustrated and needs to stop. PT gives pt a final fine motor activity of beading a few wooden beads onto elastic shoelace with PT assist of holding shoelace. When ambulating back to room, pt has one stumble req min A to prevent fall. Pt ended up in room in w/c with all needs in reach and visitors present. Continue per PT POC.   Therapy Documentation Precautions:  Precautions Precautions: Fall Precaution Comments: old R hemiparesis; new L weakness Restrictions Weight Bearing Restrictions: No  Pain: Pain Assessment Pain Assessment: No/denies pain  Balance:   Functional Gait Assessment  (FGA) Requirements: A marked 6-m (20-ft) walkway that is marked with a 30.48-cm (12-in) width.  _1_ 1. GAIT LEVEL SURFACE: 7.29 sec Instructions: Walk at your normal speed from here to the next mark (6 m[20 ft]). Grading: [place] the highest category that applies. (3) Normal-Walks 6 m (20 ft) in less than 5.5 seconds, no assistive devices, good speed, no evidence for imbalance, normal gait pattern, deviates no more than 15.24 cm (6 in) outside of the 30.48-cm (12-in) walkway width. (2) Mild impairment-Walks 6 m (20 ft) in less than 7 seconds but greater than 5.5 seconds, uses assistive device, slower speed, mild gait deviations, or deviates 15.24-25.4 cm (6-10 in) outside of the 30.48-cm (12-in) walkway width. (1) Moderate impairment-Walks 6 m (20 ft), slow speed, abnormal gait pattern, evidence for imbalance, or deviates 25.4-38.1 cm (10-15 in) outside of the 30.48-cm (12-in) walkway width. Requires more than 7 seconds to ambulate 6 m (20 ft). (0) Severe impairment-Cannot walk 6 m (20 ft) without assistance,severe gait deviations or imbalance, deviates greater than 38.1 cm (15 in) outside of the 30.48-cm (12-in) walkway width or reaches and touches the wall.  _2_ 2. CHANGE IN GAIT SPEED Instructions: Begin walking at your normal pace (for 1.5 m [5 ft]). When I tell you "go," walk as fast as you can (for 1.5 m [5 ft]). When I tell you "slow," walk as slowly as you can (for 1.5 m [5 ft]). Grading: [place] the highest category that applies. (3) Normal-Able to smoothly change walking speed without loss of balance or gait deviation. Shows a significant difference in walking speeds between  normal, fast, and slow speeds. Deviates no more than 15.24 cm (6 in) outside of the 30.48-cm (12-in) walkway width. (2) Mild impairment-Is able to change speed but demonstrates mild gait deviations, deviates 15.24-25.4 cm (6-10 in) outside of the 30.48-cm (12-in) walkway width, or no gait deviations but unable to achieve a  significant change in velocity, or uses an assistive device. (1) Moderate impairment-Makes only minor adjustments to walking speed, or accomplishes a change in speed with significant gait deviations, deviates 25.4-38.1 cm (10-15 in) outside the 30.48-cm (12-in) walkway width, or changes speed but loses balance but is able to recover and continue walking. (0) Severe impairment-Cannot change speeds, deviates greater than 38.1 cm (15 in) outside 30.48-cm (12-in) walkway width, or loses balance and has to reach for wall or be caught.  _2_ 3. GAIT WITH HORIZONTAL HEAD TURNS - one mild stumble that pt self corrects Instructions: Walk from here to the next mark 6 m (20 ft) away. Begin walking at your normal pace. Keep walking straight; after 3 steps, turn your head to the right and keep walking straight while looking to the right. After 3 more steps, turn your head to the left and keep walking straight while looking left. Continue alternating looking right and left every 3 steps until you have completed 2 repetitions in each direction. Grading: [place] the highest category that applies. (3) Normal-Performs head turns smoothly with no change in gait. Deviates no more than 15.24 cm (6 in) outside 30.48-cm (12-in) walkway width. (2) Mild impairment-Performs head turns smoothly with slight change in gait velocity (eg, minor disruption to smooth gait path), deviates 15.24-25.4 cm (6-10 in) outside 30.48-cm (12-in) walkway width, or uses an assistive device.  (1) Moderate impairment-Performs head turns with moderate change in gait velocity, slows down, deviates 25.4-38.1 cm (10-15 in) outside 30.48-cm (12-in) walkway width but recovers, can continue to walk. (0) Severe impairment-Performs task with severe disruption of gait (eg, staggers 38.1 cm [15 in] outside 30.48-cm (12-in) walkway width, loses balance, stops, or reaches for wall).  2__ 4. GAIT WITH VERTICAL HEAD TURNS Instructions: Walk from here to the next mark  (6 m [20 ft]). Begin walking at your normal pace. Keep walking straight; after 3 steps, tip your head up and keep walking straight while looking up. After 3 more steps, tip your head down, keep walking straight while looking down. Continue alternating looking up and down every 3 steps until you have completed 2 repetitions in each direction. Grading: [place] the highest category that applies. (3) Normal-Performs head turns with no change in gait. Deviates no more than 15.24 cm (6 in) outside 30.48-cm (12-in) walkway width. (2) Mild impairment-Performs task with slight change in gait velocity (eg, minor disruption to smooth gait path), deviates 15.24-25.4 cm (6-10 in) outside 30.48-cm (12-in) walkway width or uses assistive device. (1) Moderate impairment-Performs task with moderate change in gait velocity, slows down, deviates 25.4-38.1 cm (10-15 in) outside 30.48-cm (12-in) walkway width but recovers, can continue to walk. (0) Severe impairment-Performs task with severe disruption of gait (eg, staggers 38.1 cm [15 in] outside 30.48-cm (12-in) walkway width, loses balance, stops, reaches for wall).  _2_ 5. GAIT AND PIVOT TURN Instructions: Begin with walking at your normal pace. When I tell you, "turn and stop," turn as quickly as you can to face the opposite direction and stop. Grading: [place] the highest category that applies. (3) Normal-Pivot turns safely within 3 seconds and stops quickly with no loss of balance. (2) Mild impairment-Pivot turns safely  in _3 seconds and stops with no loss of balance, or pivot turns safely within 3 seconds and stops with mild imbalance, requires small steps to catch balance. (1) Moderate impairment-Turns slowly, requires verbal cueing, or requires several small steps to catch balance following turn and stop. (0) Severe impairment-Cannot turn safely, requires assistance to turn and stop.  _2_ 6. STEP OVER OBSTACLE Instructions: Begin walking at your normal speed. When  you come to the shoe box, step over it, not around it, and keep walking. Grading: [place] the highest category that applies. (3) Normal-Is able to step over 2 stacked shoe boxes taped together (22.86 cm [9 in] total height) without changing gait speed; no evidence of imbalance. (2) Mild impairment-Is able to step over one shoe box (11.43 cm [4.5 in] total height) without changing gait speed; no evidence of imbalance. (1) Moderate impairment-Is able to step over one shoe box (11.43 cm [4.5 in] total height) but must slow down and adjust steps to clear box safely. May require verbal cueing. (0) Severe impairment-Cannot perform without assistance.  _0_ 7. GAIT WITH NARROW BASE OF SUPPORT: min A Instructions: Walk on the floor with arms folded across the chest, feet aligned heel to toe in tandem for a distance of 3.6 m [12 ft]. The number of steps taken in a straight line are counted for a maximum of 10 steps. Grading: [place] the highest category that applies. (3) Normal-Is able to ambulate for 10 steps heel to toe with no staggering. (2) Mild impairment-Ambulates 7-9 steps. (1) Moderate impairment-Ambulates 4-7 steps. (0) Severe impairment-Ambulates less than 4 steps heel to toe or cannot perform without assistance.  _1_ 8. GAIT WITH EYES CLOSED: 10.64 sec Instructions: Walk at your normal speed from here to the next mark (6 m [20 ft]) with your eyes closed. Grading: [place] the highest category that applies. (3) Normal-Walks 6 m (20 ft), no assistive devices, good speed, no evidence of imbalance, normal gait pattern, deviates no more than 15.24 cm (6 in) outside 30.48-cm (12-in) walkway width. Ambulates 6 m (20 ft) in less than 7 seconds. (2) Mild impairment-Walks 6 m (20 ft), uses assistive device, slower speed, mild gait deviations, deviates 15.24-25.4 cm (6-10 in) outside 30.48-cm (12-in) walkway width. Ambulates 6 m (20 ft) in less than 9 seconds but greater than 7 seconds. (1) Moderate  impairment-Walks 6 m (20 ft), slow speed, abnormal gait pattern, evidence for imbalance, deviates 25.4-38.1 cm (10-15 in) outside 30.48-cm (12-in) walkway width. Requires more than 9 seconds to ambulate 6 m (20 ft). (0) Severe impairment-Cannot walk 6 m (20 ft) without assistance, severe gait deviations or imbalance, deviates greater than 38.1 cm (15 in) outside 30.48-cm (12-in) walkway width or will not attempt task.  _2_ 9. AMBULATING BACKWARDS Instructions: Walk backwards until I tell you to stop. Grading: [place] the highest category that applies. (3) Normal-Walks 6 m (20 ft), no assistive devices, good speed, no evidence for imbalance, normal gait pattern, deviates no more than 15.24 cm (6 in) outside 30.48-cm (12-in) walkway width. (2) Mild impairment-Walks 6 m (20 ft), uses assistive device, slower speed, mild gait deviations, deviates 15.24-25.4 cm (6-10 in) outside 30.48-cm (12-in) walkway width. (1) Moderate impairment-Walks 6 m (20 ft), slow speed, abnormal gait pattern, evidence for imbalance, deviates 25.4-38.1 cm (10-15 in) outside 30.48-cm (12-in) walkway width. (0) Severe impairment-Cannot walk 6 m (20 ft) without assistance, severe gait deviations or imbalance, deviates greater than 38.1 cm (15 in) outside 30.48-cm (12-in) walkway width or will not  attempt task.  _1_ 10. STEPS Instructions: Walk up these stairs as you would at home (ie, using the rail if necessary). At the top turn around and walk down. Grading: [place] the highest category that applies. (3) Normal-Alternating feet, no rail. (2) Mild impairment-Alternating feet, must use rail. (1) Moderate impairment-Two feet to a stair; must use rail. (0) Severe impairment-Cannot do safely.  TOTAL SCORE: ___15___ /30 (MAXIMUM SCORE=30)  Scores of ? 22/30 on the FGA were found to be effective in predicting falls, Sensitivity 85%, Specificity 86% Scores of ? 20/30 on the FGA were optimal to predict older adults residing in community  dwellings who would sustain unexplained falls in the next 6 months, Sensitivity 100%, Specificity 76% ([place], 2010; aged 55 to 67, Older Adults) [place]: 4.2 points for CVA Augustin Coupe et al, 2010) MCID: 8 points for Balance and Vestibular Disorders Marjorie Smolder and Augustin Coupe, 2014)    See Function Navigator for Current Functional Status.   Therapy/Group: Individual Therapy  Clary Meeker M 10/19/2015, 1:04 PM

## 2015-10-19 NOTE — Progress Notes (Signed)
Occupational Therapy Session Note  Patient Details  Name: Darrell [name] MRN: [id number] Date of Birth: [birth year]  Today's Date: 10/19/2015 OT Individual Time:  -  [id number]  (75 min)      Short Term Goals: Week 1:  OT Short Term Goal 1 (Week 1): Pt will transfer into shower stall with seat with min A  OT Short Term Goal 2 (Week 1): Pt will perform toileting steps with mod A  OT Short Term Goal 3 (Week 1): Pt will don shirt with setup  OT Short Term Goal 4 (Week 1): Pt will perform 2 grooming tasks in standing with supervision  OT Short Term Goal 5 (Week 1): Pt will perform bed mobility with supervision    Skilled Therapeutic Interventions/Progress Updates:    Pt engaged in therapeutic bathing and dressing at shower level.  Informed pt to get all his needs ready before going to shower.  Wife had laid out clothes.  Cued him to get all his needs for shower.  He remembered all but towels and washcloth.  Ambulate out from bathroom after shower.  He needed cues for walking with heel toe strides and going slow.  He picked up clothes that he had thrown on the floor and ambulated through tight spaces plus stepped over dirty towels with close supervision.  Left pt in wc with safety belt on and call bell,phone within reach. Ambulated to nursing station and back to room with SBA.   Therapy Documentation Precautions:  Precautions Precautions: Fall Precaution Comments: old R hemiparesis; new L weakness Restrictions Weight Bearing Restrictions: No General:   :  Pain: Pain Assessment Pain Assessment: No/denies pain       :    See Function Navigator for Current Functional Status.   Therapy/Group: Individual Therapy  Lisa Roca 10/19/2015, 3:57 PM

## 2015-10-19 NOTE — Progress Notes (Signed)
Physical Therapy Session Note  Patient Details  Name: Darrell [name] MRN: [id number] Date of Birth: 1946-03-17  Today's Date: 10/19/2015 PT Group Time: 0900-1030 PT Group Time Calculation (min): 90 min  Short Term Goals: Week 1:  PT Short Term Goal 1 (Week 1): Pt will be able to gait with overall min assist x 150' PT Short Term Goal 2 (Week 1): Pt will be able to demonstrate dynamic standing balance with min assist consistently PT Short Term Goal 3 (Week 1): Pt will be able to perform basic transfers with supervision   Therapy Documentation Precautions:  Precautions Precautions: Fall Precaution Comments: old R hemiparesis; new L weakness Restrictions Weight Bearing Restrictions: No  Patient participated in group therapy session with emphasis placed on endurance, functional mobility and dynamic balance.   Transfers: Sit to and from stand transfer with close supervision; verbal cues even weight distribution and controlled descent.    Ambulation: Patient ambulated 175 feetx3without assistive device close supervision. Patient ambulated with a step through gait pattern. Verbal cues for increased step length on right. Gait improved with increased cadence. Patient limited by fatigue.  Seated there ex: B Long arc quads 3x10 B ankle pumps 3x10 B hip flexion 3x10  NU step workload 5: 10 minutes utilizing both UE and LE endurance and strengthening. Patient required maximal verbal cues for attention to RUE grip.     Stair negotiation: Patient negotiated 12 steps with left handrail and close supervision and one episode of min assist. Min assist required with right knee buckle when attempting step over step pattern. Patient performed stairs with a step to pattern. Patient educated on proper sequence and technique and was able to return demonstration with minimal verbal cues  Standing ther ex: Heel raises 30x close supervision  Mini squats 30x.  Cone taps 2 sets alternating for 3  minutes between left and right lower extremity with minimal to moderate excursions. Close supervision with multiple LOB to the right requiring min assist to recover. Education providing on posture, positioning in midline, and stepping strategies.   Performed item retrieval from floor and able to set cone cone properly supervision and verbal cues for proper technique.   Patient performed backwards walking 25 feet in order to promote increased hip extension min assist.   Patient tolerated treatment well. Vitals monitored and remained stable throughout session responding appropriately to activity. Patient was without pain during session. Patient tolerated session with rest breaks throughout. Patient returned to room at end of session with all needs met resting comfortably in chair at bedside.  Call bell within reach and patient educated not to be up without assistance. Patient verbalized understanding.     See Function Navigator for Current Functional Status.   Therapy/Group: Group Therapy  Retta Diones 10/19/2015, 12:30 PM

## 2015-10-19 NOTE — Progress Notes (Signed)
[place] PHYSICAL MEDICINE & REHABILITATION     PROGRESS NOTE    Subjective/Complaints:   Patient feels well. Has no complaints. He has already eaten and is sitting up in wheelchair reading the paper.  Objective: Vital Signs: Blood pressure 166/104, pulse 72, temperature 98.5 F (36.9 C), temperature source Oral, resp. rate 18, height 5\' 10"  (1.778 m), weight 207 lb 7.3 oz (94.1 kg), SpO2 92 %. CBG (last 3)   Recent Labs  10/18/15 1644 10/18/15 2051 10/19/15 0710  GLUCAP 122* 86 109*    Physical Exam:  BP 166/104 mmHg  Pulse 72  Temp(Src) 98.5 F (36.9 C) (Oral)  Resp 18  Ht 5\' 10"  (1.778 m)  Wt 207 lb 7.3 oz (94.1 kg)  BMI 29.77 kg/m2  SpO2 92%  No acute distress. HEENT exam atraumatic, normocephalic Echo is supple without lymphadenopathy Chest is clear to auscultation without increased work of breathing. Cardiac exam S1 and S2 are regular Abdominal exam overweight, active bowel sounds, soft Extremities no edema. Assessment/Plan: 1. 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  Cont CIR 2. DVT Prophylaxis/Anticoagulation: Subcutaneous Lovenox. Monitor platelet counts for any signs of bleeding 3. Pain Management: Tylenol as needed 4. Mood: Celexa 10 mg daily. Provide emotional support 5. Neuropsych: This patient is capable of making decisions on his own behalf. 6. Skin/Wound Care: Routine skin checks 7. Fluids/Electrolytes/Nutrition: Basic Metabolic Panel:    Component Value Date/Time   NA 138 10/15/2015 0529   K 3.9 10/15/2015 0529   CL 105 10/15/2015 0529   CO2 26 10/15/2015 0529   BUN 12 10/15/2015 0529   CREATININE 0.76 10/15/2015 0529   GLUCOSE 133*  10/15/2015 0529   GLUCOSE 141* 10/05/2006 0904   CALCIUM 8.9 10/15/2015 0529    8. Hypertension. No current antihypertensive medication. Patient on lisinopril 20 mg daily prior to admission.   147/95-166/104 Will start low-dose lisinopril. 9. Diabetes mellitus with peripheral neuropathy. Hemoglobin A1c 6.9. Currently well controlled. 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 11. Hypoalbuminemia: Will start protein supplementation and periodically monitor Lab Results  Component Value Date   LABPROT 14.4 10/12/2015     LOS (Days) 5 A FACE TO FACE EVALUATION WAS PERFORMED  SWORDS,BRUCE HENRY 10/19/2015 8:33 AM

## 2015-10-20 LAB — GLUCOSE, CAPILLARY
GLUCOSE-CAPILLARY: 112 mg/dL — AB (ref 65–99)
GLUCOSE-CAPILLARY: 153 mg/dL — AB (ref 65–99)
Glucose-Capillary: 99 mg/dL (ref 65–99)
Glucose-Capillary: 135 mg/dL — ABNORMAL HIGH (ref 65–99)

## 2015-10-20 NOTE — Progress Notes (Signed)
[place] PHYSICAL MEDICINE & REHABILITATION     PROGRESS NOTE    Subjective/Complaints:   He is sitting up in a chair this morning. He has already had breakfast. He feels well. He enjoys therapy.  Objective: Vital Signs: Blood pressure 114/68, pulse 63, temperature 97.5 F (36.4 C), temperature source Oral, resp. rate 16, height 5\' 10"  (1.778 m), weight 207 lb 7.3 oz (94.1 kg), SpO2 90 %. CBG (last 3)   Recent Labs  10/19/15 1628 10/19/15 2114 10/20/15 0650  GLUCAP 118* 97 112*    Physical Exam:  BP 114/68 mmHg  Pulse 63  Temp(Src) 97.5 F (36.4 C) (Oral)  Resp 16  Ht 5\' 10"  (1.778 m)  Wt 207 lb 7.3 oz (94.1 kg)  BMI 29.77 kg/m2  SpO2 90%  No acute distress. HEENT exam atraumatic, normocephalic Echo is supple without lymphadenopathy Chest is clear to auscultation without increased work of breathing. Cardiac exam S1 and S2 are regular Abdominal exam overweight, active bowel sounds, soft Extremities no edema. Assessment/Plan: 1. 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  Cont CIR 2. DVT Prophylaxis/Anticoagulation: Subcutaneous Lovenox. Monitor platelet counts for any signs of bleeding 3. Pain Management: Tylenol as needed 4. Mood: Celexa 10 mg daily.  5. Neuropsych: This patient is capable of making decisions on his own behalf. 6. Skin/Wound Care: Routine skin checks 7. Fluids/Electrolytes/Nutrition: Basic Metabolic Panel:    Component Value Date/Time   NA 138 10/15/2015 0529   K 3.9 10/15/2015 0529   CL 105 10/15/2015 0529   CO2 26 10/15/2015 0529   BUN 12 10/15/2015 0529   CREATININE 0.76 10/15/2015 0529   GLUCOSE 133* 10/15/2015 0529   GLUCOSE 141*  10/05/2006 0904   CALCIUM 8.9 10/15/2015 0529    8. Hypertension. No current antihypertensive medication. Patient on lisinopril 20 mg daily prior to admission.    Will start low-dose lisinopril. on 10/19/2015.   today blood pressure ranges 114-132/68-89.  9. Diabetes mellitus with peripheral neuropathy. Hemoglobin A1c 6.9. Currently well controlled. 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 11. Hypoalbuminemia: Will start protein supplementation and periodically monitor Lab Results  Component Value Date   LABPROT 14.4 10/12/2015     LOS (Days) 6 A FACE TO FACE EVALUATION WAS PERFORMED  SWORDS,BRUCE HENRY 10/20/2015 8:59 AM

## 2015-10-21 ENCOUNTER — Inpatient Hospital Stay (HOSPITAL_COMMUNITY): Payer: Medicare Other | Admitting: Physical Therapy

## 2015-10-21 ENCOUNTER — Inpatient Hospital Stay (HOSPITAL_COMMUNITY): Payer: Medicare Other | Admitting: Occupational Therapy

## 2015-10-21 ENCOUNTER — Inpatient Hospital Stay (HOSPITAL_COMMUNITY): Payer: Medicare Other

## 2015-10-21 LAB — BASIC METABOLIC PANEL
ANION GAP: 8 (ref 5–15)
BUN: 13 mg/dL (ref 6–20)
CO2: 26 mmol/L (ref 22–32)
Calcium: 9.1 mg/dL (ref 8.9–10.3)
Chloride: 102 mmol/L (ref 101–111)
Creatinine, Ser: 0.75 mg/dL (ref 0.61–1.24)
GFR calc Af Amer: >60 mL/min (ref >60)
GFR calc non Af Amer: >60 mL/min (ref >60)
GLUCOSE: 152 mg/dL — AB (ref 65–99)
POTASSIUM: 4.3 mmol/L (ref 3.5–5.1)
Sodium: 136 mmol/L (ref 135–145)

## 2015-10-21 LAB — CREATININE, SERUM
Creatinine, Ser: 0.94 mg/dL (ref 0.61–1.24)
GFR calc Af Amer: >60 mL/min (ref >60)
GFR calc non Af Amer: >60 mL/min (ref >60)

## 2015-10-21 LAB — GLUCOSE, CAPILLARY
GLUCOSE-CAPILLARY: 113 mg/dL — AB (ref 65–99)
Glucose-Capillary: 135 mg/dL — ABNORMAL HIGH (ref 65–99)
Glucose-Capillary: 111 mg/dL — ABNORMAL HIGH (ref 65–99)
Glucose-Capillary: 116 mg/dL — ABNORMAL HIGH (ref 65–99)

## 2015-10-21 LAB — ALBUMIN: ALBUMIN: 3.6 g/dL (ref 3.5–5.0)

## 2015-10-21 MED ORDER — BACLOFEN 10 MG PO TABS
10.0000 mg | ORAL_TABLET | Freq: Three times a day (TID) | ORAL | Status: DC
  Administered 2015-10-21 – 2015-10-26 (×14): 10 mg via ORAL
  Filled 2015-10-21 (×14): qty 1

## 2015-10-21 NOTE — Progress Notes (Signed)
Physical Therapy Weekly Progress Note  Patient Details  Name: Darrell [name] MRN: [id number] Date of Birth: 17-Aug-1946  Beginning of progress report period: 10/15/15 End of progress report period: 10/21/15  Today's Date: 10/21/2015 PT Individual Time: 1330-1500 PT Individual Time Calculation (min): 90 min   Patient has met 3 of 3 short term goals.    Patient continues to demonstrate the following deficits: balance, coordination, cognition, motor control, and therefore will continue to benefit from skilled PT intervention to enhance overall performance with balance, postural control, ability to compensate for deficits, functional use of  right upper extremity, right lower extremity, left upper extremity and left lower extremity, attention, awareness and coordination.  Patient progressing toward long term goals..  Continue plan of care.  PT Short Term Goals Week 1:  PT Short Term Goal 1 (Week 1): Pt will be able to gait with overall min assist x 150' PT Short Term Goal 1 - Progress (Week 1): Met PT Short Term Goal 2 (Week 1): Pt will be able to demonstrate dynamic standing balance with min assist consistently PT Short Term Goal 2 - Progress (Week 1): Met PT Short Term Goal 3 (Week 1): Pt will be able to perform basic transfers with supervision PT Short Term Goal 3 - Progress (Week 1): Met  STGs week 2= LTGs due to LOS  Skilled Therapeutic Interventions/Progress Updates:  pt stated he did not need to use toilet before leaving room.   Gait using SPC to/from gym on level tile, cues for safe use of SPC. Pt tends to hold [facility] with hand grip backwards. He stated his cane is different, and plans to ask wife to bring it in.    neuromuscular re-education via forced use, visual cues for bil LEs and LUE at level 4 x 5 minutes, and bil LEs only at level 5 x 5 minutes, focusing on R knee neutral hip rotation and full knee extension. Pt requested using toilet; he stated he awakens 5-6x/night to  use toilet, for years.  Falls risks discussed with pt. Gait to return to room; toilet transfer.   Mat> floor via 2 stages of large step stools due to pt's inability to accept sufficient wt on bil UEs to lower onto knees.  Once on floor, pt unable to problem solve how to get back up; once on L side for pushing up onto bil Knees, pt able to initiate but unable to tolerate.  +3 needed for pt to arise all the way to mat, even with step stool for LUE support when briefly on knees. Pt stated he would call 911 if he fell.  He again asked to use toilet; gait to return to room.  After toilet transfer, about 3 minutes later, pt said he would "call Narda Rutherford if I fall outside".  neuromuscular re-education via forced use, VCs for R/L hamstring and heel cord stretching in sitting, x 30 sec x 3 each; lateral step ups r/l x 8 each with LUE support, focusing on hip flexion to allow foot clearance. Kicking in standing with R foot focusing on hip and knee flexion; 1 LOB in 8 trials.   Velocity: 14 seconds for 10MWT; 2.34'/sec, with less scissoring RLE than last week . Gait to return to room with [facility].  Pt again asked to use toilet; pt had LOB x 2 during toilet transfer due to fatigue.  Max assist needed to prevent fall.  Pt left sitting on toilet with NT informed, just outside of room.  PT  informed RN that pt had urinated 3x in 40 minutes; no c/o burning.     Therapy Documentation Precautions:  Precautions Precautions: Fall Precaution Comments: old R hemiparesis; new L weakness Restrictions Weight Bearing Restrictions: No   Pain: Pain Assessment Pain Assessment: No/denies pain   See Function Navigator for Current Functional Status.  Therapy/Group: Individual Therapy  Naithen Rivenburg 10/21/2015, 4:36 PM

## 2015-10-21 NOTE — Progress Notes (Signed)
Physical Therapy Note  Patient Details  Name: Avir Wunsch MRN: [id number] Date of Birth: 1946-09-20 Today's Date: 10/21/2015    Time: [id number] minutes  1:1 No c/o pain.  Session focused on gait and pregait training.  Gait without AD with close supervision in controlled environment, pt still with noted scissoring when fatigued, narrow BOS.  Gait with pt cued to put feet on either side of green line to widen BOS, some improvement. Pre gait activities with focus on hip abduction and flexion to improve stability, min A needed for balance. Side stepping, backward gait and gait ladder all with min A, cuing for coordination.  Pt then states he would like to try [facility] as that is what he used at home.  Pt close supervision in controlled environment with SPC, pt needs cues to use SPC and not just carry with during turns and obstacle negotiation. Pt continues to fluctuate between supervision and min A for gait depending on fatigue.  Halea Lieb 10/21/2015, 8:43 AM

## 2015-10-21 NOTE — Progress Notes (Signed)
Occupational Therapy Session Note  Patient Details  Name: Darrell [name] MRN: [id number] Date of Birth: 03/17/1946  Today's Date: 10/21/2015 OT Individual Time:  -       Short Term Goals: Week 1:  OT Short Term Goal 1 (Week 1): Pt will transfer into shower stall with seat with min A  OT Short Term Goal 2 (Week 1): Pt will perform toileting steps with mod A  OT Short Term Goal 3 (Week 1): Pt will don shirt with setup  OT Short Term Goal 4 (Week 1): Pt will perform 2 grooming tasks in standing with supervision  OT Short Term Goal 5 (Week 1): Pt will perform bed mobility with supervision Week 2:     Skilled Therapeutic Interventions/Progress Updates:    Skilled Therapeutic Interventions/Progress Updates:    Pt engaged in therapeutic bathing and dressing at shower level. Informed pt to get all his needs ready before going to shower.Pt.proceded to undress at recliner level with doffing pants, socks and shirt.  No clothes had been laid out.  Pt reported when asked what he needed for shower, he stated washcloth, towels and soap.  Asked what he needed after shower; he stated he needed to go to bathroom right now.  .  Had [place] but walked to toilet without it and SBA.    He reported he only needed cane for outside.   Pt toileted with SBA.  Asled pt again what he needed after shower.  He reported he needed clothes.  He proceded to ambulate to chest of drawers and get clothes out.  He put on the bed.  Continued to provide questioning cues for pt to organize and anticipate his needs.  Pt. Dressed in bathroom and finished in recliner.  Left pt in recliner with call bell,phone within reach.      Therapy Documentation Precautions:  Precautions Precautions: Fall Precaution Comments: old R hemiparesis; new L weakness Restrictions Weight Bearing Restrictions: No    Vital Signs: Therapy Vitals Temp: 98 F (36.7 C) Temp Source: Oral Pulse Rate: 62 Resp: 18 BP: 124/80 mmHg Patient Position  (if appropriate): Lying Oxygen Therapy SpO2: 90 % O2 Device: Not Delivered Pain:  none            See Function Navigator for Current Functional Status.   Therapy/Group: Individual Therapy  Lisa Roca 10/21/2015, 7:51 AM

## 2015-10-21 NOTE — Progress Notes (Signed)
[place] PHYSICAL MEDICINE & REHABILITATION     PROGRESS NOTE    Subjective/Complaints:  Pt seen morning lying in bed. He states he did not sleep well last night because he had to use the restroom several times. He denies dysuria urgency or any other symptoms.   ROS: Denies  urinary urgency, dysuria,CP, SOB, n/v/d.   Objective: Vital Signs: Blood pressure 124/80, pulse 62, temperature 98 F (36.7 C), temperature source Oral, resp. rate 18, height 5\' 10"  (1.778 m), weight 94.1 kg (207 lb 7.3 oz), SpO2 90 %. No results found. No results for input(s): WBC, HGB, HCT, PLT in the last 72 hours.  Recent Labs  10/21/15 0620  CREATININE 0.94   CBG (last 3)   Recent Labs  10/20/15 1638 10/20/15 2055 10/21/15 0642  GLUCAP 153* 135* 135*    Wt Readings from Last 3 Encounters:  10/16/15 94.1 kg (207 lb 7.3 oz)  10/12/15 93.441 kg (206 lb)  08/20/15 94.802 kg (209 lb)    Physical Exam:  BP 124/80 mmHg  Pulse 62  Temp(Src) 98 F (36.7 C) (Oral)  Resp 18  Ht 5\' 10"  (1.778 m)  Wt 94.1 kg (207 lb 7.3 oz)  BMI 29.77 kg/m2  SpO2 90% Constitutional: He appears well-developed and well-nourished. Vital signs reviewed.  HENT: Normocephalic and atraumatic.  Eyes: Conjunctivae and EOM are normal.  Neck: Normal range of motion. No thyromegaly present.  Cardiovascular: Normal rate and regular rhythm.  Respiratory: Effort normal and breath sounds normal. No respiratory distress.  GI: Soft. Bowel sounds are normal. He exhibits no distension.  Musculoskeletal: He exhibits no edema or tenderness.  Flexion contracture right elbow ~75 deg. Neurological: He is alert and oriented.  Follows commands Sensation intact to light touch Motor: RUE: 4+/5 proximal to distal, flexor spasticity at elbow, extensor tone in 2nd digit LUE: Shoulder abduction, elbow flexion and extension 4+/5, hand grip 3+/5 B/L LE: 4/5  Skin: Skin is warm and dry.  Psychiatric: He has a normal mood and affect.  His behavior is normal  Assessment/Plan: 1. 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 Physiatrist is providing close team supervision and 24 hour management of active medical problems listed below. Physiatrist and rehab team continue to assess barriers to discharge/monitor patient progress toward functional and medical goals.  Function:  Bathing Bathing position   Position: Shower  Bathing parts Body parts bathed by patient: Right arm, Chest, Abdomen, Front perineal area, Right upper leg, Left upper leg, Left arm, Buttocks, Right lower leg, Left lower leg Body parts bathed by helper: Back  Bathing assist Assist Level: Touching or steadying assistance(Pt > 75%)      Upper Body Dressing/Undressing Upper body dressing   What is the patient wearing?: Pull over shirt/dress     Pull over shirt/dress - Perfomed by patient: Thread/unthread left sleeve, Put head through opening, Thread/unthread right sleeve Pull over shirt/dress - Perfomed by helper: Pull shirt over trunk        Upper body assist Assist Level: Touching or steadying assistance(Pt > 75%)      Lower Body Dressing/Undressing Lower body dressing   What is the patient wearing?: Underwear, Pants, Socks, Shoes, [employer]  - Performed by patient: Thread/unthread right underwear leg, Thread/unthread left underwear leg, Pull underwear up/down Underwear - Performed by helper: Pull underwear up/down Pants- Performed by  patient: Thread/unthread right pants leg, Thread/unthread left pants leg, Pull pants up/down Pants- Performed by helper: Pull pants up/down       Socks - Performed by helper: Don/doff right sock, Don/doff left sock Shoes - Performed by patient: Don/doff right shoe Shoes - Performed by helper: Don/doff left  shoe       TED Hose - Performed by helper: Don/doff right TED hose, Don/doff left TED hose  Lower body assist Assist for lower body dressing: Touching or steadying assistance (Pt > 75%)      Toileting Toileting   Toileting steps completed by patient: Adjust clothing prior to toileting, Performs perineal hygiene, Adjust clothing after toileting Toileting steps completed by helper: Adjust clothing prior to toileting, Adjust clothing after toileting Toileting Assistive Devices: Grab bar or rail  Toileting assist Assist level: Touching or steadying assistance (Pt.75%)   Transfers Chair/bed transfer   Chair/bed transfer method: Ambulatory Chair/bed transfer assist level: Supervision or verbal cues Chair/bed transfer assistive device: Armrests     Locomotion Ambulation     Max distance: 200' Assist level: Supervision or verbal cues   Wheelchair     Max wheelchair distance: 120 Assist Level: No help, No cues, assistive device, takes more than reasonable amount of time  Cognition Comprehension Comprehension assist level: Follows basic conversation/direction with extra time/assistive device  Expression Expression assist level: Expresses basic 90% of the time/requires cueing < 10% of the time.  Social Interaction Social Interaction assist level: Interacts appropriately with others with medication or extra time (anti-anxiety, antidepressant).  Problem Solving Problem solving assist level: Solves basic 90% of the time/requires cueing < 10% of the time  Memory Memory assist level: Recognizes or recalls 90% of the time/requires cueing < 10% of the time    Medical Problem List and Plan: 1. 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  Cont CIR 2. DVT Prophylaxis/Anticoagulation: Subcutaneous Lovenox. Monitor platelet counts for any signs of bleeding 3. Pain  Management: Tylenol as needed 4. Mood: Celexa 10 mg daily. Provide emotional support 5. Neuropsych: This patient is capable of making decisions on his own behalf. 6. Skin/Wound Care: Routine skin checks 7. Fluids/Electrolytes/Nutrition: Routine I&O   Will order labs for tomorrow  8. Hypertension. No current antihypertensive medication. Patient on lisinopril 20 mg daily prior to admission.   lisinopril started on 12/24 9. Diabetes mellitus with peripheral neuropathy. Hemoglobin A1c 6.9. Glucophage 1000 milligrams twice a day. Check blood sugars before meals and at bedtime. Diabetic teaching  Overall, fairly well controlled, fasting CBG 135 this AM 10. Residual spasticity RUE: Will educate on stretching techniques and splinting and consider medications if necessary  Pt states he has tried botox without relief.  Started on baclofen on 12/22. Will increase today  11. Hypoalbuminemia: Will start protein supplementation and periodically monitor  will order labs for tomorrow  LOS (Days) 7 A FACE TO FACE EVALUATION WAS PERFORMED  Derry Kassel Lorie Phenix 10/21/2015 8:12 AM

## 2015-10-22 ENCOUNTER — Inpatient Hospital Stay (HOSPITAL_COMMUNITY): Payer: Medicare Other | Admitting: Occupational Therapy

## 2015-10-22 ENCOUNTER — Inpatient Hospital Stay (HOSPITAL_COMMUNITY): Payer: Medicare Other | Admitting: Physical Therapy

## 2015-10-22 ENCOUNTER — Inpatient Hospital Stay (HOSPITAL_COMMUNITY): Payer: Medicare Other

## 2015-10-22 LAB — CBC WITH DIFFERENTIAL/PLATELET
BASOS PCT: 0 %
Basophils Absolute: 0 10*3/uL (ref 0.0–0.1)
EOS ABS: 0.2 10*3/uL (ref 0.0–0.7)
EOS PCT: 3 %
HCT: 45.4 % (ref 39.0–52.0)
HEMOGLOBIN: 14.6 g/dL (ref 13.0–17.0)
Lymphocytes Relative: 21 %
Lymphs Abs: 1.7 10*3/uL (ref 0.7–4.0)
MCH: 29.1 pg (ref 26.0–34.0)
MCHC: 32.2 g/dL (ref 30.0–36.0)
MCV: 90.4 fL (ref 78.0–100.0)
MONO ABS: 0.7 10*3/uL (ref 0.1–1.0)
MONOS PCT: 9 %
NEUTROS PCT: 67 %
Neutro Abs: 5.3 10*3/uL (ref 1.7–7.7)
PLATELETS: 190 10*3/uL (ref 150–400)
RBC: 5.02 MIL/uL (ref 4.22–5.81)
RDW: 13.8 % (ref 11.5–15.5)
WBC: 8 10*3/uL (ref 4.0–10.5)

## 2015-10-22 LAB — GLUCOSE, CAPILLARY
GLUCOSE-CAPILLARY: 126 mg/dL — AB (ref 65–99)
GLUCOSE-CAPILLARY: 103 mg/dL — AB (ref 65–99)
Glucose-Capillary: 117 mg/dL — ABNORMAL HIGH (ref 65–99)
Glucose-Capillary: 127 mg/dL — ABNORMAL HIGH (ref 65–99)

## 2015-10-22 MED ORDER — LISINOPRIL 10 MG PO TABS
10.0000 mg | ORAL_TABLET | Freq: Every day | ORAL | Status: DC
  Administered 2015-10-22 – 2015-10-26 (×5): 10 mg via ORAL
  Filled 2015-10-22 (×5): qty 1

## 2015-10-22 NOTE — Progress Notes (Signed)
Physical Therapy Note  Patient Details  Name: Darrell [name] MRN: [id number] Date of Birth: December 13, 1945 Today's Date: 10/22/2015    Time: [phone] 56 minutes  1:1 No c/o pain. Pt had his cane from home which is a single lofstrand crutch.  Pt able to gait with improved safety and stability with lofstrand, supervision for gait >250' throughout unit on various surfaces. Pt requires close supervision when fatigued, multiple LOB that pt was able to self correct with use of lofstrand. Pt performed furniture transfers from a variety of surfaces in solarium with cuing for UE placement and occasional assist to hold crutch while pt stood, supervision for tranfers.  Passive stretching to bilat hamstrings and hips with pt only able to get to neutral hip IR, SLR limited to 40 degrees due to hamstring tightness.  Nustep for LE strength and endurance level 4 x 8 minutes.    Joley Utecht 10/22/2015, 10:26 AM

## 2015-10-22 NOTE — Progress Notes (Signed)
[place] PHYSICAL MEDICINE & REHABILITATION     PROGRESS NOTE    Subjective/Complaints:  C/o bladder issues, has freq but no dysuria, not needing ICP  ROS: Denies  urinary urgency, dysuria,CP, SOB, n/v/d.   Objective: Vital Signs: Blood pressure 154/98, pulse 68, temperature 97.8 F (36.6 C), temperature source Oral, resp. rate 16, height 5\' 10"  (1.778 m), weight 94.1 kg (207 lb 7.3 oz), SpO2 90 %. No results found.  Recent Labs  10/22/15 0532  WBC 8.0  HGB 14.6  HCT 45.4  PLT 190    Recent Labs  10/21/15 0620 10/21/15 0955  NA  --  136  K  --  4.3  CL  --  102  GLUCOSE  --  152*  BUN  --  13  CREATININE 0.94 0.75  CALCIUM  --  9.1   CBG (last 3)   Recent Labs  10/21/15 1647 10/21/15 2051 10/22/15 0658  GLUCAP 116* 113* 126*    Wt Readings from Last 3 Encounters:  10/16/15 94.1 kg (207 lb 7.3 oz)  10/12/15 93.441 kg (206 lb)  08/20/15 94.802 kg (209 lb)    Physical Exam:  BP 154/98 mmHg  Pulse 68  Temp(Src) 97.8 F (36.6 C) (Oral)  Resp 16  Ht 5\' 10"  (1.778 m)  Wt 94.1 kg (207 lb 7.3 oz)  BMI 29.77 kg/m2  SpO2 90% Constitutional: He appears well-developed and well-nourished. Vital signs reviewed.  HENT: Normocephalic and atraumatic.  Eyes: Conjunctivae and EOM are normal.  Neck: Normal range of motion. No thyromegaly present.  Cardiovascular: Normal rate and regular rhythm.  Respiratory: Effort normal and breath sounds normal. No respiratory distress.  GI: Soft. Bowel sounds are normal. He exhibits no distension.  Musculoskeletal: He exhibits no edema or tenderness.  Flexion contracture right elbow ~75 deg. Neurological: He is alert and oriented.  Follows commands Sensation intact to light touch Motor: RUE: 4+/5 proximal to distal, flexor spasticity at elbow, extensor tone in 2nd digit, decreased fine motor in RIght hand LUE: Shoulder abduction, elbow flexion and extension 4+/5, hand grip 3+/5 B/L LE: 4/5  Skin: Skin is warm and  dry.  Psychiatric: He has a normal mood and affect. His behavior is normal  Assessment/Plan: 1. 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 Physiatrist is providing close team supervision and 24 hour management of active medical problems listed below. Physiatrist and rehab team continue to assess barriers to discharge/monitor patient progress toward functional and medical goals.  Function:  Bathing Bathing position   Position: Shower  Bathing parts Body parts bathed by patient: Right arm, Chest, Abdomen, Front perineal area, Right upper leg, Left upper leg, Left arm, Buttocks, Right lower leg, Left lower leg Body parts bathed by helper: Back  Bathing assist Assist Level: Touching or steadying assistance(Pt > 75%)      Upper Body Dressing/Undressing Upper body dressing   What is the patient wearing?: Pull over shirt/dress     Pull over shirt/dress - Perfomed by patient: Thread/unthread left sleeve, Put head through opening, Thread/unthread right sleeve, Pull shirt over trunk Pull over shirt/dress - Perfomed by helper: Pull shirt over trunk        Upper body assist Assist Level: Touching or steadying assistance(Pt > 75%)      Lower Body Dressing/Undressing Lower body dressing   What is the patient  wearing?: Underwear, Pants, Socks, Shoes, [employer]  - Performed by patient: Thread/unthread right underwear leg, Thread/unthread left underwear leg, Pull underwear up/down Underwear - Performed by helper: Pull underwear up/down Pants- Performed by patient: Thread/unthread right pants leg, Thread/unthread left pants leg, Pull pants up/down Pants- Performed by helper: Pull pants up/down       Socks - Performed by helper: Don/doff right sock, Don/doff left sock Shoes - Performed by  patient: Don/doff right shoe Shoes - Performed by helper: Don/doff left shoe       TED Hose - Performed by helper: Don/doff right TED hose, Don/doff left TED hose  Lower body assist Assist for lower body dressing: Touching or steadying assistance (Pt > 75%)      Toileting Toileting   Toileting steps completed by patient: Adjust clothing prior to toileting, Performs perineal hygiene, Adjust clothing after toileting Toileting steps completed by helper: Adjust clothing prior to toileting, Adjust clothing after toileting Toileting Assistive Devices: Grab bar or rail  Toileting assist Assist level: Touching or steadying assistance (Pt.75%)   Transfers Chair/bed transfer   Chair/bed transfer method: Ambulatory Chair/bed transfer assist level: Supervision or verbal cues Chair/bed transfer assistive device: Librarian, academic     Max distance: 150 Assist level: Supervision or verbal cues   Wheelchair     Max wheelchair distance: 120 Assist Level: No help, No cues, assistive device, takes more than reasonable amount of time  Cognition Comprehension Comprehension assist level: Follows basic conversation/direction with extra time/assistive device  Expression Expression assist level: Expresses basic 90% of the time/requires cueing < 10% of the time.  Social Interaction Social Interaction assist level: Interacts appropriately with others with medication or extra time (anti-anxiety, antidepressant).  Problem Solving Problem solving assist level: Solves basic 25 - 49% of the time - needs direction more than half the time to initiate, plan or complete simple activities  Memory Memory assist level: Recognizes or recalls 75 - 89% of the time/requires cueing 10 - 24% of the time    Medical Problem List and Plan: 1. 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  Cont CIR 2. DVT Prophylaxis/Anticoagulation: Subcutaneous Lovenox. Monitor platelet counts for any signs of bleeding 3. Pain Management: Tylenol as needed 4. Mood: Celexa 10 mg daily. Provide emotional support 5. Neuropsych: This patient is capable of making decisions on his own behalf. 6. Skin/Wound Care: Routine skin checks 7. Fluids/Electrolytes/Nutrition: Routine I&O  BMET normal 12/26 8. Hypertension. uncontrolled. Patient on lisinopril 20 mg daily prior to admission.   lisinopril started on 12/24- BUN and Creat normal, may need to adjust doswe or add second agent 9. Diabetes mellitus with peripheral neuropathy. Hemoglobin A1c 6.9. Glucophage 1000 milligrams twice a day. Check blood sugars before meals and at bedtime. Diabetic teaching  Overall, fairly well controlled, fasting CBG 126 this AM 10. Residual spasticity RUE: Will educate on stretching techniques and splinting and consider medications if necessary  Pt states he has tried botox without relief.  Started on baclofen on 12/22. No severe spasticity noted this am 11. Hypoalbuminemia: Will start protein supplementation and periodically monitor, enc po   LOS (Days) 8 A FACE TO FACE EVALUATION WAS PERFORMED  Alysia Penna E 10/22/2015 7:15 AM

## 2015-10-22 NOTE — Progress Notes (Signed)
Occupational Therapy Note  Patient Details  Name: Darrell [name] MRN: [id number] Date of Birth: [birth year]  Today's Date: 10/22/2015 OT Individual Time: 1300-1330 OT Individual Time Calculation (min): 30 min   Pt denied pain Individual therapy  Pt resting in w/c upon arrival.  Pt amb with left lofstrand crutch to therapy gym.  Pt initially engaged in dynamic standing tasks, sit<>stand tasks, and functional amb without AD to transport items in LUE.  Pt transitioned to Left hand dexterity/manipulation tasks while seated.  Pt returned to room and remained in recliner with all needs within reach.    Leotis Shames [facility] 10/22/2015, 2:40 PM

## 2015-10-22 NOTE — Progress Notes (Signed)
Occupational Therapy Session Note  Patient Details  Name: Darrell [name] MRN: [id number] Date of Birth: Jul 28, 1946  Today's Date: 10/22/2015 OT Individual Time: 0735-0905 OT Individual Time Calculation (min): 90 min    Short Term Goals: Week 2:     Skilled Therapeutic Interventions/Progress Updates: Patient participated in skilled OT to address batheing & dressing shower in room with focus on cognitive processing for sequencing steps for preparation before self care, balance for functional mobility, right & left upper extremity coordination and use.  Patient tended not to complete bilateral UE use when encouraged to do so, rather he stated, "That side does not work well at all."  He did concur to practice right UE use and bilateral UE use.   Patient tended to walk with bent knees and neck forward and stated, "I have to be careful not to hit my head on anything in this shower."    Patient participated in R UE coordination and strengthening as well - still progressing on digital coordination and motor planning.  Patient resting in bed with call bell & bed alarm & phone within patient's reach place at end of session.      Therapy Documentation Precautions:  Precautions Precautions: Fall Precaution Comments: old R hemiparesis; new L weakness Restrictions Weight Bearing Restrictions: No     Pain:denied   Therapy/Group: Individual Therapy  Alfredia Ferguson [facility] 10/22/2015, 7:57 AM

## 2015-10-22 NOTE — Progress Notes (Deleted)
Occupational Therapy Session Note  Patient Details  Name: Darrell [name] MRN: [id number] Date of Birth: 09-07-1946  Today's Date: 10/22/2015 OT Individual Time: [id number] Total Time=90 minutes    Skilled Therapeutic Interventions/Progress Updates: Patient participated in skilled OT with focus on both left UE use and bilateral use, incorporating  Right UE  (old CVA).   Patient used compensatory strategies to use L UE only and hesitated incorporating R UE.   Initially, he stated, "My hand won't do anything," but he went ahead and completed bilateral activities incorporating right UE for assist and more.   Otherwise, he was able to complete functional mobility, though he walked with neck flexion and was reminded to try to extend/hold up his neck.  He was able to complete functional mobility for room to toilet to shower and back to room - with close supervision.     Therapy Documentation Precautions:  Precautions Precautions: Fall Precaution Comments: old R hemiparesis; new L weakness Restrictions Weight Bearing Restrictions: No    Pain: denied   See Function Navigator for Current Functional Status.   Therapy/Group: Individual Therapy  Alfredia Ferguson [facility] 10/22/2015, 11:58 AM

## 2015-10-22 NOTE — Progress Notes (Signed)
Pt up to void 7 times throughout the night with 3 incont episodes. Pt said this started this week, and denies any pain or discomfort while voiding. Pt does complain of lower back pain 3/10. PVR this am for 159ml. Will continue to monitor. Kennieth Francois, RN

## 2015-10-22 NOTE — Progress Notes (Signed)
Occupational Therapy Note  Patient Details  Name: Darrell [name] MRN: [id number] Date of Birth: [birth year]  Today's Date: 10/22/2015 OT Individual Time: 1100-1130 OT Individual Time Calculation (min): 30 min   Pt denied pain Individual therapy  Pt resting in bed upon arrival and agreeable to therapy.  Pt sat EOB with steady A to engage in LUE therapeutic activities including grasping clothes pins from container and placing them on a vertical dowel. Pt removed clothes pins and placed them on a horizontal dowel.  Pt required rest breaks X 4 secondary to muscle fatigue. Focus on increased functional use of LUE to increase independence with BADLs.   Darrell [name] [facility] 10/22/2015, 11:53 AM

## 2015-10-23 ENCOUNTER — Inpatient Hospital Stay (HOSPITAL_COMMUNITY): Payer: Medicare Other

## 2015-10-23 ENCOUNTER — Inpatient Hospital Stay (HOSPITAL_COMMUNITY): Payer: Medicare Other | Admitting: Occupational Therapy

## 2015-10-23 LAB — URINALYSIS, ROUTINE W REFLEX MICROSCOPIC
Bilirubin Urine: NEGATIVE
GLUCOSE, UA: NEGATIVE mg/dL
HGB URINE DIPSTICK: NEGATIVE
Ketones, ur: NEGATIVE mg/dL
Nitrite: NEGATIVE
Protein, ur: NEGATIVE mg/dL
SPECIFIC GRAVITY, URINE: 1.019 (ref 1.005–1.030)
pH: 5 (ref 5.0–8.0)

## 2015-10-23 LAB — GLUCOSE, CAPILLARY
GLUCOSE-CAPILLARY: 117 mg/dL — AB (ref 65–99)
Glucose-Capillary: 125 mg/dL — ABNORMAL HIGH (ref 65–99)
Glucose-Capillary: 100 mg/dL — ABNORMAL HIGH (ref 65–99)
Glucose-Capillary: 105 mg/dL — ABNORMAL HIGH (ref 65–99)

## 2015-10-23 LAB — URINE MICROSCOPIC-ADD ON

## 2015-10-23 NOTE — Progress Notes (Signed)
Occupational Therapy Session Note  Patient Details  Name: Darrell [name] MRN: [id number] Date of Birth: 1946/03/10  Today's Date: 10/23/2015 OT Individual Time: 0938-1829 OT Individual Time Calculation (min): 56 min    Short Term Goals: Week 1:  OT Short Term Goal 1 (Week 1): Pt will transfer into shower stall with seat with min A  OT Short Term Goal 1 - Progress (Week 1): Met OT Short Term Goal 2 (Week 1): Pt will perform toileting steps with mod A  OT Short Term Goal 2 - Progress (Week 1): Met OT Short Term Goal 3 (Week 1): Pt will don shirt with setup  OT Short Term Goal 3 - Progress (Week 1): Met OT Short Term Goal 4 (Week 1): Pt will perform 2 grooming tasks in standing with supervision  OT Short Term Goal 4 - Progress (Week 1): Met OT Short Term Goal 5 (Week 1): Pt will perform bed mobility with supervision OT Short Term Goal 5 - Progress (Week 1): Met  Skilled Therapeutic Interventions/Progress Updates:    Pt ambulated to the therapy gym with min guard assist using a lofstrand crutch on the left side.  Mod instructional cueing to still use the LUE to push up and reach back for the surface during sit to stand and stand pivot transfers.  Worked on RUE and LUE coordination and use during session.  Utilized PVC pipe puzzle for BUE integration while standing.  He needs max instructional cueing to integrate the RUE into the task.  Even though pt demonstrates movement at a gross assist level from previous CVA, he still does not attempt functional use.  Moderate difficulty with visual and cognitive processing when attempting simple puzzle so did not transition to moderately difficult ones.  Transitioned to FM coordination on the LUE in sitting.  Pt was able to complete 9 hole peg test in 1 min 13 seconds first attempt, and 44 seconds on second attempt.  Had pt work on reaching up and forward to place 1 inch pegs in board with the LUE.  Increased difficulty when attempting to perform in  hand manipulation and pick up and place more than 1 peg.  Pt unable to recall directions after multiple cues to pick up one peg at a time, and then manipulate them in his hand to place them in the grid.  Pt easily frustrated during performance of tasks.  Transitioned back to the room for completion of session.  Pt left in bedside chair with soft touch call button in place.    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   Therapy/Group: Individual Therapy  Thuan Tippett OTR/L 10/23/2015, 12:21 PM

## 2015-10-23 NOTE — Progress Notes (Signed)
Occupational Therapy Weekly Progress Note  Patient Details  Name: Darrell [name] MRN: [id number] Date of Birth: 17-Jun-1946  Beginning of progress report period: October 15, 2015 End of progress report period: October 23, 2015  Patient has met 5 of 5 short term goals.  Pt has made steady progress with BADLs during this admission.  Pt continues to require min verbal cues for safety awareness with transitional movements and functional amb with lofstrand crutch. Pt continues to require assist with donning shoes and bathing feet without use of AE.  Pt completes toileting tasks with steady A.  Pt stands at sink for grooming tasks with supervision. Patient continues to demonstrate the following deficits: decreased balance, decreased BUE coordination and functional use, decreased memory,  and therefore will continue to benefit from skilled OT intervention to enhance overall performance with BADL.  Patient progressing toward long term goals..  Continue plan of care.  OT Short Term Goals Week 1:  OT Short Term Goal 1 (Week 1): Pt will transfer into shower stall with seat with min A  OT Short Term Goal 1 - Progress (Week 1): Met OT Short Term Goal 2 (Week 1): Pt will perform toileting steps with mod A  OT Short Term Goal 2 - Progress (Week 1): Met OT Short Term Goal 3 (Week 1): Pt will don shirt with setup  OT Short Term Goal 3 - Progress (Week 1): Met OT Short Term Goal 4 (Week 1): Pt will perform 2 grooming tasks in standing with supervision  OT Short Term Goal 4 - Progress (Week 1): Met OT Short Term Goal 5 (Week 1): Pt will perform bed mobility with supervision OT Short Term Goal 5 - Progress (Week 1): Met Week 2:  OT Short Term Goal 1 (Week 2): STG=LTG secondary to ELOS    Therapy Documentation Precautions:  Precautions Precautions: Fall Precaution Comments: old R hemiparesis; new L weakness Restrictions Weight Bearing Restrictions: No  See Function Navigator for Current  Functional Status.    Darrell [name] [facility] 10/23/2015, 11:29 AM   Clyda Greener, OTR/L 10/23/2015

## 2015-10-23 NOTE — Progress Notes (Signed)
Darrell [name] PHYSICAL MEDICINE & REHABILITATION     PROGRESS NOTE    Subjective/Complaints:  C/o bladder issues, has freq but no dysuria, not needing ICP  ROS: Denies  urinary urgency, dysuria,CP, SOB, n/v/d.   Objective: Vital Signs: Blood pressure 133/82, pulse 66, temperature 97.6 F (36.4 C), temperature source Oral, resp. rate 18, height 5\' 10"  (1.778 m), weight 94.1 kg (207 lb 7.3 oz), SpO2 96 %. No results found.  Recent Labs  10/22/15 0532  WBC 8.0  HGB 14.6  HCT 45.4  PLT 190    Recent Labs  10/21/15 0620 10/21/15 0955  NA  --  136  K  --  4.3  CL  --  102  GLUCOSE  --  152*  BUN  --  13  CREATININE 0.94 0.75  CALCIUM  --  9.1   CBG (last 3)   Recent Labs  10/22/15 1624 10/22/15 2147 10/23/15 0642  GLUCAP 117* 127* 125*    Wt Readings from Last 3 Encounters:  10/16/15 94.1 kg (207 lb 7.3 oz)  10/12/15 93.441 kg (206 lb)  08/20/15 94.802 kg (209 lb)    Physical Exam:  BP 133/82 mmHg  Pulse 66  Temp(Src) 97.6 F (36.4 C) (Oral)  Resp 18  Ht 5\' 10"  (1.778 m)  Wt 94.1 kg (207 lb 7.3 oz)  BMI 29.77 kg/m2  SpO2 96% Constitutional: He appears well-developed and well-nourished. Vital signs reviewed.  HENT: Normocephalic and atraumatic.  Eyes: Conjunctivae and EOM are normal.  Neck: Normal range of motion. No thyromegaly present.  Cardiovascular: Normal rate and regular rhythm.  Respiratory: Effort normal and breath sounds normal. No respiratory distress.  GI: Soft. Bowel sounds are normal. He exhibits no distension.  Musculoskeletal: He exhibits no edema or tenderness.  Flexion contracture right elbow ~75 deg. Neurological: He is alert and oriented.  Follows commands Sensation intact to light touch Motor: RUE: 4+/5 proximal to distal, flexor spasticity at elbow, extensor tone in 2nd digit, decreased fine motor in RIght hand LUE: Shoulder abduction, elbow flexion and extension 4+/5, hand grip 3+/5 B/L LE: 4/5  Skin: Skin is warm and  dry.  Psychiatric: He has a normal mood and affect. His behavior is normal  Assessment/Plan: 1. 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 Physiatrist is providing close team supervision and 24 hour management of active medical problems listed below. Physiatrist and rehab team continue to assess barriers to discharge/monitor patient progress toward functional and medical goals.  Function:  Bathing Bathing position   Position: Shower  Bathing parts Body parts bathed by patient: Right arm, Left arm, Chest, Abdomen, Front perineal area, Right upper leg, Left upper leg, Buttocks Body parts bathed by helper: Right lower leg, Left lower leg, Back  Bathing assist Assist Level: Touching or steadying assistance(Pt > 75%)      Upper Body Dressing/Undressing Upper body dressing   What is the patient wearing?: Pull over shirt/dress     Pull over shirt/dress - Perfomed by patient: Thread/unthread left sleeve, Put head through opening Pull over shirt/dress - Perfomed by helper: Thread/unthread right sleeve, Pull shirt over trunk        Upper body assist Assist Level: Touching or steadying assistance(Pt > 75%)      Lower Body Dressing/Undressing Lower body dressing   What is the patient wearing?:  Underwear, Pants, Socks, Shoes, [employer]  - Performed by patient:  (wore brief) Underwear - Performed by helper: Pull underwear up/down Pants- Performed by patient: Thread/unthread right pants leg, Thread/unthread left pants leg, Pull pants up/down, Fasten/unfasten pants Pants- Performed by helper: Pull pants up/down       Socks - Performed by helper: Don/doff right sock, Don/doff left sock Shoes - Performed by patient: Don/doff right shoe Shoes - Performed by helper: Don/doff  left shoe       TED Hose - Performed by helper: Don/doff right TED hose, Don/doff left TED hose  Lower body assist Assist for lower body dressing: Touching or steadying assistance (Pt > 75%)      Toileting Toileting   Toileting steps completed by patient: Adjust clothing prior to toileting, Performs perineal hygiene, Adjust clothing after toileting Toileting steps completed by helper: Adjust clothing prior to toileting, Adjust clothing after toileting Toileting Assistive Devices: Grab bar or rail  Toileting assist Assist level: Touching or steadying assistance (Pt.75%)   Transfers Chair/bed transfer   Chair/bed transfer method: Ambulatory Chair/bed transfer assist level: Supervision or verbal cues Chair/bed transfer assistive device: Librarian, academic     Max distance: 150 Assist level: Supervision or verbal cues   Wheelchair     Max wheelchair distance: 120 Assist Level: No help, No cues, assistive device, takes more than reasonable amount of time  Cognition Comprehension Comprehension assist level: Understands basic 90% of the time/cues < 10% of the time  Expression Expression assist level: Expresses basic 90% of the time/requires cueing < 10% of the time.  Social Interaction Social Interaction assist level: Interacts appropriately with others with medication or extra time (anti-anxiety, antidepressant).  Problem Solving Problem solving assist level: Solves basic 50 - 74% of the time/requires cueing 25 - 49% of the time  Memory Memory assist level: Recognizes or recalls 50 - 74% of the time/requires cueing 25 - 49% of the time    Medical Problem List and Plan: 1. 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  Team conference today please see physician documentation under team conference tab, met with team face-to-face to  discuss problems,progress, and goals. Formulized individual treatment plan based on medical history, underlying problem and comorbidities. 2. DVT Prophylaxis/Anticoagulation: Subcutaneous Lovenox. Monitor platelet counts for any signs of bleeding 3. Pain Management: Tylenol as needed 4. Mood: Celexa 10 mg daily. Provide emotional support 5. Neuropsych: This patient is capable of making decisions on his own behalf. 6. Skin/Wound Care: Routine skin checks 7. Fluids/Electrolytes/Nutrition: Routine I&O  BMET normal 12/26 8. Hypertension. uncontrolled. Patient on lisinopril 20 mg daily prior to admission. 133/82 today  lisinopril started on 12/24- BUN and Creat normal, may need to adjust dose or add second agent 9. Diabetes mellitus with peripheral neuropathy. Hemoglobin A1c 6.9. Glucophage 1000 milligrams twice a day. Check blood sugars before meals and at bedtime. Diabetic teaching  Overall, fairly well controlled, fasting CBG 125 this AM 10. Residual spasticity RUE: Will educate on stretching techniques and splinting and consider medications if necessary  Pt states he has tried botox without relief.  Started on baclofen on 12/22. No severe spasticity noted this am 11. Hypoalbuminemia: Will start protein supplementation and periodically monitor, enc po   LOS (Days) 9 A FACE TO FACE EVALUATION WAS PERFORMED  Scottie Stanish E 10/23/2015 7:28 AM

## 2015-10-23 NOTE — Patient Care Conference (Signed)
Inpatient RehabilitationTeam Conference and Plan of Care Update Date: 10/23/2015   Time: 10:50 AM    Patient Name: Darrell [name]      Medical Record Number: [id number]  Date of Birth: 05-13-1946 Sex: Male         Room/Bed: 4M03C/4M03C-01 Payor Info: Payor: MEDICARE / Plan: MEDICARE PART A AND B / Product Type: *No Product type* /    Admitting Diagnosis: RT CVA  Admit Date/Time:  10/14/2015  4:06 PM Admission Comments: No comment available   Primary Diagnosis:  Hemiparesis affecting dominant side as late effect of stroke ([place]) Principal Problem: Hemiparesis affecting dominant side as late effect of stroke [facility])  Patient Active Problem List   Diagnosis Date Noted  . Diabetes mellitus type 2 in nonobese (HCC)   . Hypoalbuminemia due to protein-calorie malnutrition ([place]) 10/15/2015  . Poorly controlled type 2 diabetes mellitus with peripheral neuropathy ([place]) 10/14/2015  . History of CVA with residual deficit 10/14/2015  . Hemiparesis affecting dominant side as late effect of stroke ([place]) 10/14/2015  . Depression 10/14/2015  . Basal ganglia infarction ([place]) 10/14/2015  . Acute ischemic stroke ([place])   . Left-sided weakness 10/12/2015  . Abnormal CXR 10/12/2015  . BPH associated with nocturia 08/20/2015  . Aftercare following surgery of the circulatory system, [place] 02/13/2014  . Postop check 02/06/2014  . Left inguinal hernia 12/28/2013  . Rib fractures 10/13/2013  . Fall 10/13/2013  . Dysplastic nevus of trunk 02/14/2013  . Bicipital tendinitis of right shoulder 07/12/2012  . Subacromial bursitis, right 03/28/2012  . Right spastic hemiparesis ([place]) 02/02/2012  . Occlusion and stenosis of carotid artery with cerebral infarction 01/26/2012  . Hemiplegia affecting right dominant side ([place]) 12/24/2011  . CVA (cerebral infarction) 12/15/2011  . Occlusion and stenosis of carotid artery without mention of cerebral infarction 12/08/2011  . Carotid artery plaque 12/04/2011  . Left  carotid bruit 11/30/2011  . AUTONOMIC NEUROPATHY, DIABETIC 11/25/2010  . Hyperlipidemia 03/15/2008  . Obstructive sleep apnea 07/27/2007  . Diabetes 1.5, managed as type 2 ([place]) 06/15/2007  . Essential hypertension 06/15/2007    Expected Discharge Date: Expected Discharge Date: 10/26/15  Team Members Present: Physician leading conference: Dr. Alysia Penna Social Worker Present: Ovidio Kin, LCSW Nurse Present: Dorien Chihuahua, RN PT Present: Georjean Mode, PT OT Present: Clyda Greener, OT SLP Present: Windell Moulding, SLP PPS Coordinator present : Daiva Nakayama, RN, CRRN     Current Status/Progress Goal Weekly Team Focus  Medical   Right hamstring spasm, chronic memory issues  home at sup level  D/C planning   Bowel/Bladder   Continent of bowel and bladder. LBM 10/21/15  Pt to remain continent of bowel and bladder  Monitor   Swallow/Nutrition/ Hydration     na        ADL's   bathing-steady A, UB dressing-supervision, LB dressing-min A, functional amb with AD-close supervision  supervision overall  transfers, activity tolerance, functional amb, standing tolerance and balance, functional use/NMR LUE   Mobility   at goal level  S overall; min stairs  family ed, HEP, safety using Lofstrand, balance   Communication     na        Safety/Cognition/ Behavioral Observations    no unsafe behaviors        Pain   No c/o pain  <3  Monitor   Skin   CDI  No additional skin breakdown  Encourage to turn q 2hrs      *See Care Plan and progress notes  for long and short-term goals.  Barriers to Discharge: Work on recovery from falls    Possible Resolutions to Barriers:  PT, OT will work on above, wife to come in for training    Discharge Planning/Teaching Needs:  Wife has been in and observed pt in therapies, preparing for discharge next week, will go to OP therapies.      Team Discussion:  Reaching goals sooner than target discharge date-overall supervision level. Memory and  carryover issues from past CVA. R-hamstring tight-MD to check and see if candidate for botox.   Revisions to Treatment Plan:  Moved up discharge date to 12/31   Continued Need for Acute Rehabilitation Level of Care: The patient requires daily medical management by a physician with specialized training in physical medicine and rehabilitation for the following conditions: Daily direction of a multidisciplinary physical rehabilitation program to ensure safe treatment while eliciting the highest outcome that is of practical value to the patient.: Yes Daily medical management of patient stability for increased activity during participation in an intensive rehabilitation regime.: Yes Daily analysis of laboratory values and/or radiology reports with any subsequent need for medication adjustment of medical intervention for : Neurological problems;Other  Abdulai Blaylock, Gardiner Rhyme 10/24/2015, 12:22 PM

## 2015-10-23 NOTE — Progress Notes (Signed)
Physical Therapy Session Note  Patient Details  Name: Darrell [name] MRN: [id number] Date of Birth: 05-Sep-1946  Today's Date: 10/23/2015 PT Individual Time: 1005-1035; [id number] PT Individual Time Calculation (min): 30 min , 42 min  Short Term Goals: Week 2:= LTGs due to LOS     Skilled Therapeutic Interventions/Progress Updates:  tx 1:  gait in room and toilet transfer using L Lofstrand crutch . VCs for safe use of crutch; pt tends to put hand in it and stand up with crutch far out in front of him.  Demo for correct use of L hand for sit><stand with crutch, followed by 4 trials ; pt remembered hand placement for sit>stand 4/4, and stand>sit 2/4 trials. Gait up/down 12 corner steps, L rail with supervision for step through method for strengthening; no knee buckling noted.  neuromuscular re-education via forced use, demo, VCs for [place] in standing: R/L hip abduction with 2# L ankle x 8, calf raises/toe raises x 8 ; external perturbations to elicit ankle and hip strategies demonstrate slow R ankle strategy, slow bil hip strategy.  Pt improved with practice. Returned to room and left resting in recliner, all needs within reach.  tx 2:  neuro re-ed: [place] A exs in standing at sink,  10 x 1 hamstring curls, R/L, and hamstring and heel cord stretches R/L in standing x 30 seconds x 2 each.  Gait on outdoor terrain on level concrete, sloping concrete with close supervision, and on mulch x 10' x 5 with min guard fading to supervision.  R foot with fair clearance on mulch and concrete slope, but without tripping or stumbling.  Up/down 4 outdoor steps L rail x 3 with safe use of Lofstrand crutch hanging from forearm cuff as he used L hand on railing, supervision.  neuromuscular re-education via VCs for [place] in sitting at resistance 40 cm/sec x 5 minutes, focusing on R knee and hip extension to press foot plate to floor. PT returned pt to room for resting inw/c with all needs left within  reach.    D/c moved to 12/31, Sat.  Wife coming in for family ed tomorrow. Therapy Documentation Precautions:  Precautions Precautions: Fall Precaution Comments: old R hemiparesis; new L weakness Restrictions Weight Bearing Restrictions: No   Pain: Pain Assessment Pain Assessment: No/denies pain   :  See Function Navigator for Current Functional Status.   Therapy/Group: Individual Therapy  Dayan Desa 10/23/2015, 11:14 AM

## 2015-10-23 NOTE — Progress Notes (Signed)
Social Work Patient ID: Darrell [name], male   DOB: [birth year], 69 y.o.   MRN: [id number] Met with pt and spoke with wife via telephone to discuss team conference and reaching his goals sooner than expected discharge date of 1/3.  Have moved up date to 12/31. Wife to come in tomorrow and attend therapies with pt to see his levels. Have arranged OP therapies upon discharge and he has all equipment.

## 2015-10-23 NOTE — Progress Notes (Signed)
Occupational Therapy Session Note  Patient Details  Name: Darrell [name] MRN: [id number] Date of Birth: 05-01-1946  Today's Date: 10/23/2015 OT Individual Time: [id number] OT Individual Time Calculation (min): 57 min    Short Term Goals: Week 1:  OT Short Term Goal 1 (Week 1): Pt will transfer into shower stall with seat with min A  OT Short Term Goal 2 (Week 1): Pt will perform toileting steps with mod A  OT Short Term Goal 3 (Week 1): Pt will don shirt with setup  OT Short Term Goal 4 (Week 1): Pt will perform 2 grooming tasks in standing with supervision  OT Short Term Goal 5 (Week 1): Pt will perform bed mobility with supervision  Skilled Therapeutic Interventions/Progress Updates:    Pt engaged in BADL retraining including bathing, dressing, toilet transfers, shower transfers, and toileting.  Pt amb without AD from toilet to shower requiring steady A.  Pt completed bathing at shower level, performing sit<>stand to bathe buttocks. Pt required assistance bathing feet without AE. Pt returned to room and sat EOB to select clothing.  Pt completed dressing tasks with sit<>stand from EOB, requiring assistance donning shoes.  Pt amb to sink and stood at sink to brush teeth.  Pt amb to and from therapy gym and ADL with lofstrand cane with close supervision.  Focus on activity tolerance, sit<>stand, standing balance, toileting, functional transfers, functional amb with AD, and safety awareness to increase independence with BADLs.  Therapy Documentation Precautions:  Precautions Precautions: Fall Precaution Comments: old R hemiparesis; new L weakness Restrictions Weight Bearing Restrictions: No Pain: Pain Assessment Pain Assessment: No/denies pain  See Function Navigator for Current Functional Status.   Therapy/Group: Individual Therapy  Leroy Libman 10/23/2015, 8:59 AM

## 2015-10-23 NOTE — Progress Notes (Signed)
Social Work Patient ID: Darrell [name], male   DOB: January 06, 1946, 69 y.o.   MRN: [id number] Spoke with wife to arrange family education for tomorrow, she can be here for his 9:00 and 10:45 am therapy sessions. Will let team know.

## 2015-10-24 ENCOUNTER — Inpatient Hospital Stay (HOSPITAL_COMMUNITY): Payer: Medicare Other | Admitting: Occupational Therapy

## 2015-10-24 ENCOUNTER — Inpatient Hospital Stay (HOSPITAL_COMMUNITY): Payer: Medicare Other

## 2015-10-24 LAB — GLUCOSE, CAPILLARY
GLUCOSE-CAPILLARY: 111 mg/dL — AB (ref 65–99)
Glucose-Capillary: 113 mg/dL — ABNORMAL HIGH (ref 65–99)
Glucose-Capillary: 129 mg/dL — ABNORMAL HIGH (ref 65–99)

## 2015-10-24 MED ORDER — MIRABEGRON ER 25 MG PO TB24
25.0000 mg | ORAL_TABLET | Freq: Every day | ORAL | Status: DC
  Administered 2015-10-24 – 2015-10-26 (×3): 25 mg via ORAL
  Filled 2015-10-24 (×3): qty 1

## 2015-10-24 NOTE — Progress Notes (Signed)
Social Work Patient ID: Darrell [name], male   DOB: 1945-10-27, 69 y.o.   MRN: [id number] Met with pt an wife who was here for family education both feel good about discharge Sat and feel ready. Discussed follow up OP therapies and they have been there before. Aware appointment is 1/5.  Wife can provide supervision level and no concerns or questions regarding pt's care. Ready for discharge on Sat.

## 2015-10-24 NOTE — Progress Notes (Signed)
Occupational Therapy Session Note  Patient Details  Name: Darrell [name] MRN: [id number] Date of Birth: 12/23/1945  Today's Date: 10/24/2015 OT Individual Time: 1117-1202 OT Individual Time Calculation (min): 45 min    Short Term Goals: Week 2:  OT Short Term Goal 1 (Week 2): STG=LTG secondary to ELOS  Skilled Therapeutic Interventions/Progress Updates:    Session 1:  Pt completed bathing and dressing with wife present for education.  Supervision level for transfer into and out of the shower using his Lofstrand crutch.  Mod instructional cueing to integrate the RUE into bathing as he has learned to not use it over the past few years even though he is at a gross assist/stabilizer level.  He did incorporate it with supervision when washing the left arm and with mod facilitation from therapist for washing under the left arm.  Increased motor planning difficulty noted when attempting to manipulate hand held shower to rinse off his left underarm or buttocks.  Pt quick to say he cannot perform something instead of trying it first.  Educated wife on getting a hand held shower for home as they currently do not have one.  Also discussed rubber mat to help reduce risk of fall getting into the shower and also for standing in the shower as well.  Pt and spouse also report having grab bars already installed too.  He was able to complete most of dressing with supervision level except for donning TEDs.  Pt easily frustrated with donning long sleeve pullover shirt and decided instead to donn a short sleeve one.  Wife was able to voice understanding of safe assist at pt's current supervision level.    Session 2:  (14:02-15:31)  Began session with ambulation to the ADL apartment for practice with walk-in shower transfers and bed transfers using regular bed.  Pt able to complete transfers to both with supervision and min instructional cueing for shower transfer as it pertains to sequencing which foot to step over  the edge with first.  Educated pt on bed rail that could also be purchased to make supine to sit transitions easier as well.  Pt voiced liking the rail when he tried it.  Discussed this type of rail with pt's spouse earlier in the morning.  Transitioned to the therapy gym with focus on BUE functional reach and coordination activities.  Had pt use the RUE to place cards on vertical grid with mod assist, supervision when using the LUE.  Also perform UE ergonometer for a total of 9 mins using just the LUE.  Performed 3, 3 minute intervals with resistance level on 1 and then on 5 for final 2 sets.  Attempted to use the RUE as well however pt with increased glenohumeral pain, so did not proceed.  Performed card sorting task in sitting after completion of UE ergonometer.  Mod assist for use of the RUE to flip single cards.  Pt unable to efficiently oppose thumb to first and second digit without facilitation.  Finished session with return back to room with supervision and completion of toileting task at supervision level as well.    Therapy Documentation Precautions:  Precautions Precautions: Fall Precaution Comments: old R hemiparesis; new L weakness, cognitive impairment Restrictions Weight Bearing Restrictions: No  Pain:  No report of pain  ADL: See Function Navigator for Current Functional Status.   Therapy/Group: Individual Therapy  Aquil Duhe OTR/L 10/24/2015, 3:46 PM

## 2015-10-24 NOTE — Progress Notes (Signed)
[facility]     PROGRESS NOTE    Subjective/Complaints:  Urinary freq but not dysuria Pt aware of revised D/C date  ROS: Denies  urinary urgency, dysuria,CP, SOB, n/v/d.   Objective: Vital Signs: Blood pressure 128/78, pulse 69, temperature 98.5 F (36.9 C), temperature source Oral, resp. rate 18, height '5\' 10"'$  (1.778 m), weight 88.678 kg (195 lb 8 oz), SpO2 92 %. No results found.  Recent Labs  10/22/15 0532  WBC 8.0  HGB 14.6  HCT 45.4  PLT 190    Recent Labs  10/21/15 0955  NA 136  K 4.3  CL 102  GLUCOSE 152*  BUN 13  CREATININE 0.75  CALCIUM 9.1   CBG (last 3)   Recent Labs  10/23/15 1630 10/23/15 2126 10/24/15 0642  GLUCAP 100* 105* 113*    Wt Readings from Last 3 Encounters:  10/23/15 88.678 kg (195 lb 8 oz)  10/12/15 93.441 kg (206 lb)  08/20/15 94.802 kg (209 lb)    Physical Exam:  BP 128/78 mmHg  Pulse 69  Temp(Src) 98.5 F (36.9 C) (Oral)  Resp 18  Ht '5\' 10"'$  (1.778 m)  Wt 88.678 kg (195 lb 8 oz)  BMI 28.05 kg/m2  SpO2 92% Constitutional: He appears well-developed and well-nourished. Vital signs reviewed.  HENT: Normocephalic and atraumatic.  Eyes: Conjunctivae and EOM are normal.  Neck: Normal range of motion. No thyromegaly present.  Cardiovascular: Normal rate and regular rhythm.  Respiratory: Effort normal and breath sounds normal. No respiratory distress.  GI: Soft. Bowel sounds are normal. He exhibits no distension.  Musculoskeletal: He exhibits no edema or tenderness.  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  Follows commands Sensation intact to light touch Motor: RUE: 4+/5 proximal to distal, flexor spasticity at elbow, extensor tone in 2nd digit, decreased fine motor in RIght hand LUE: Shoulder abduction, elbow flexion and extension 4+/5, hand grip 3+/5 B/L LE: 4/5  Skin: Skin is warm and dry.  Psychiatric: He has a  normal mood and affect. His behavior is normal  Assessment/Plan: 1. 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 Physiatrist is providing close team supervision and 24 hour management of active medical problems listed below. Physiatrist and rehab team continue to assess barriers to discharge/monitor patient progress toward functional and medical goals.  Function:  Bathing Bathing position   Position: Shower  Bathing parts Body parts bathed by patient: Right arm, Left arm, Chest, Abdomen, Front perineal area, Right upper leg, Left upper leg, Buttocks Body parts bathed by helper: Right lower leg, Left lower leg, Back  Bathing assist Assist Level: Touching or steadying assistance(Pt > 75%)      Upper Body Dressing/Undressing Upper body dressing   What is the patient wearing?: Pull over shirt/dress     Pull over shirt/dress - Perfomed by patient: Thread/unthread left sleeve, Put head through opening, Thread/unthread right sleeve, Pull shirt over trunk Pull over shirt/dress - Perfomed by helper: Thread/unthread right sleeve, Pull shirt over trunk        Upper body assist Assist Level: Touching or steadying assistance(Pt > 75%)      Lower Body Dressing/Undressing Lower body dressing   What is the patient wearing?: Underwear, Pants, [employer], Shoes Underwear - Performed by patient: Thread/unthread  right underwear leg, Thread/unthread left underwear leg, Pull underwear up/down Underwear - Performed by helper: Pull underwear up/down Pants- Performed by patient: Thread/unthread right pants leg, Thread/unthread left pants leg, Pull pants up/down, Fasten/unfasten pants Pants- Performed by helper: Pull pants up/down       Socks - Performed by helper: Don/doff right sock, Don/doff left  sock Shoes - Performed by patient: Don/doff left shoe Shoes - Performed by helper: Don/doff right shoe       TED Hose - Performed by helper: Don/doff right TED hose, Don/doff left TED hose  Lower body assist Assist for lower body dressing: Touching or steadying assistance (Pt > 75%)      Toileting Toileting   Toileting steps completed by patient: Adjust clothing prior to toileting, Performs perineal hygiene Toileting steps completed by helper: Adjust clothing prior to toileting, Adjust clothing after toileting Toileting Assistive Devices: Grab bar or rail  Toileting assist Assist level: Touching or steadying assistance (Pt.75%)   Transfers Chair/bed transfer   Chair/bed transfer method: Ambulatory Chair/bed transfer assist level: Supervision or verbal cues Chair/bed transfer assistive device: Librarian, academic     Max distance: 100 Assist level: Supervision or verbal cues   Wheelchair     Max wheelchair distance: 120 Assist Level: No help, No cues, assistive device, takes more than reasonable amount of time  Cognition Comprehension Comprehension assist level: Understands basic 90% of the time/cues < 10% of the time  Expression Expression assist level: Expresses basic 90% of the time/requires cueing < 10% of the time.  Social Interaction Social Interaction assist level: Interacts appropriately with others with medication or extra time (anti-anxiety, antidepressant).  Problem Solving Problem solving assist level: Solves basic 50 - 74% of the time/requires cueing 25 - 49% of the time  Memory Memory assist level: Recognizes or recalls 50 - 74% of the time/requires cueing 25 - 49% of the time    Medical Problem List and Plan: 1. 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  Team conference today please see physician  documentation under team conference tab, met with team face-to-face to discuss problems,progress, and goals. Formulized individual treatment plan based on medical history, underlying problem and comorbidities. 2. DVT Prophylaxis/Anticoagulation: Subcutaneous Lovenox. Monitor platelet counts for any signs of bleeding 3. Pain Management: Tylenol as needed 4. Mood: Celexa 10 mg daily. Provide emotional support 5. Neuropsych: This patient is capable of making decisions on his own behalf. 6. Skin/Wound Care: Routine skin checks 7. Fluids/Electrolytes/Nutrition: Routine I&O  BMET normal 12/26 8. Hypertension. Controlled on lisinopril and hydralazine   9. Diabetes mellitus with peripheral neuropathy. Hemoglobin A1c 6.9. Glucophage 1000 milligrams twice a day. Check blood sugars before meals and at bedtime. Diabetic teaching  Overall, fairly well controlled, fasting CBG 113 this AM 10. Residual spasticity RUE: Will educate on stretching techniques and splinting and consider medications if necessary  Pt states he has tried botox without relief.has underlying contracture  Started on baclofen on 12/22. No side effects 11. Hypoalbuminemia: Will start protein supplementation and periodically monitor, enc po 12.  Urinary freq without evidence of infection will check PVR, trial Myrbetriq for spastic bladder  LOS (Days) 10 A FACE TO FACE EVALUATION WAS PERFORMED  Alysia Penna E 10/24/2015 7:40 AM

## 2015-10-24 NOTE — Plan of Care (Signed)
Problem: RH BLADDER ELIMINATION Goal: RH STG MANAGE BLADDER WITH ASSISTANCE STG Manage Bladder With Assistance. Mod I  Outcome: Not Progressing Patient had an incontinent episode during this shift. Staff did the total bed linen change.

## 2015-10-24 NOTE — Progress Notes (Signed)
Social Work Elease Hashimoto, LCSW Social Worker Signed  Patient Care Conference 10/23/2015 12:26 PM    Expand All Collapse All   Inpatient RehabilitationTeam Conference and Plan of Care Update Date: 10/23/2015   Time: 10:50 AM     Patient Name: Darrell [name] Record Number: [id number]  Date of Birth: 1946/01/21 Sex: Male         Room/Bed: 4M03C/4M03C-01 Payor Info: Payor: MEDICARE / Plan: MEDICARE PART A AND B / Product Type: *No Product type* /    Admitting Diagnosis: RT CVA   Admit Date/Time:  10/14/2015  4:06 PM Admission Comments: No comment available   Primary Diagnosis:  Hemiparesis affecting dominant side as late effect of stroke ([place]) Principal Problem: Hemiparesis affecting dominant side as late effect of stroke [facility])    Patient Active Problem List     Diagnosis  Date Noted   .  Diabetes mellitus type 2 in nonobese (HCC)     .  Hypoalbuminemia due to protein-calorie malnutrition ([place])  10/15/2015   .  Poorly controlled type 2 diabetes mellitus with peripheral neuropathy ([place])  10/14/2015   .  History of CVA with residual deficit  10/14/2015   .  Hemiparesis affecting dominant side as late effect of stroke ([place])  10/14/2015   .  Depression  10/14/2015   .  Basal ganglia infarction ([place])  10/14/2015   .  Acute ischemic stroke ([place])     .  Left-sided weakness  10/12/2015   .  Abnormal CXR  10/12/2015   .  BPH associated with nocturia  08/20/2015   .  Aftercare following surgery of the circulatory system, [place]  02/13/2014   .  Postop check  02/06/2014   .  Left inguinal hernia  12/28/2013   .  Rib fractures  10/13/2013   .  Fall  10/13/2013   .  Dysplastic nevus of trunk  02/14/2013   .  Bicipital tendinitis of right shoulder  07/12/2012   .  Subacromial bursitis, right  03/28/2012   .  Right spastic hemiparesis ([place])  02/02/2012   .  Occlusion and stenosis of carotid artery with cerebral infarction  01/26/2012   .  Hemiplegia affecting right  dominant side ([place])  12/24/2011   .  CVA (cerebral infarction)  12/15/2011   .  Occlusion and stenosis of carotid artery without mention of cerebral infarction  12/08/2011   .  Carotid artery plaque  12/04/2011   .  Left carotid bruit  11/30/2011   .  AUTONOMIC NEUROPATHY, DIABETIC  11/25/2010   .  Hyperlipidemia  03/15/2008   .  Obstructive sleep apnea  07/27/2007   .  Diabetes 1.5, managed as type 2 ([place])  06/15/2007   .  Essential hypertension  06/15/2007     Expected Discharge Date: Expected Discharge Date: 10/26/15  Team Members Present: Physician leading conference: Dr. Alysia Penna Social Worker Present: Ovidio Kin, LCSW Nurse Present: Dorien Chihuahua, RN PT Present: Georjean Mode, PT OT Present: Clyda Greener, OT SLP Present: Windell Moulding, SLP PPS Coordinator present : Daiva Nakayama, RN, CRRN        Current Status/Progress  Goal  Weekly Team Focus   Medical     Right hamstring spasm, chronic memory issues   home at sup level  D/C planning   Bowel/Bladder     Continent of bowel and bladder. LBM 10/21/15   Pt to remain continent of bowel and bladder  Monitor   Swallow/Nutrition/ Hydration       na         ADL's     bathing-steady A, UB dressing-supervision, LB dressing-min A, functional amb with AD-close supervision  supervision overall  transfers, activity tolerance, functional amb, standing tolerance and balance, functional use/NMR LUE   Mobility     at goal level  S overall; min stairs  family ed, HEP, safety using Lofstrand, balance    Communication       na         Safety/Cognition/ Behavioral Observations      no unsafe behaviors         Pain     No c/o pain  <3  Monitor   Skin     CDI  No additional skin breakdown  Encourage to turn q 2hrs      *See Care Plan and progress notes for long and short-term goals.    Barriers to Discharge:  Work on recovery from falls     Possible Resolutions to Barriers:   PT, OT will work on above, wife to come in  for training      Discharge Planning/Teaching Needs:   Wife has been in and observed pt in therapies, preparing for discharge next week, will go to OP therapies.        Team Discussion:    Reaching goals sooner than target discharge date-overall supervision level. Memory and carryover issues from past CVA. R-hamstring tight-MD to check and see if candidate for botox.    Revisions to Treatment Plan:    Moved up discharge date to 12/31    Continued Need for Acute Rehabilitation Level of Care: The patient requires daily medical management by a physician with specialized training in physical medicine and rehabilitation for the following conditions: Daily direction of a multidisciplinary physical rehabilitation program to ensure safe treatment while eliciting the highest outcome that is of practical value to the patient.: Yes Daily medical management of patient stability for increased activity during participation in an intensive rehabilitation regime.: Yes Daily analysis of laboratory values and/or radiology reports with any subsequent need for medication adjustment of medical intervention for : Neurological problems;Other  Elease Hashimoto 10/24/2015, 12:22 PM                 Elease Hashimoto, LCSW Social Worker Signed  Patient Care Conference 10/16/2015  3:03 PM    Expand All Collapse All   Inpatient RehabilitationTeam Conference and Plan of Care Update Date: 10/16/2015   Time: 1:25 PM     Patient Name: Darrell [name]      Medical Record Number: [id number]  Date of Birth: July 14, 1946 Sex: Male         Room/Bed: 4M03C/4M03C-01 Payor Info: Payor: MEDICARE / Plan: MEDICARE PART A AND B / Product Type: *No Product type* /    Admitting Diagnosis: RT CVA   Admit Date/Time:  10/14/2015  4:06 PM Admission Comments: No comment available   Primary Diagnosis:  Hemiparesis affecting dominant side as late effect of stroke ([place]) Principal Problem: Hemiparesis affecting dominant side as  late effect of stroke [facility])    Patient Active Problem List     Diagnosis  Date Noted   .  Hypoalbuminemia due to protein-calorie malnutrition ([place])  10/15/2015   .  Poorly controlled type 2 diabetes mellitus with peripheral neuropathy ([place])  10/14/2015   .  History of CVA with residual deficit  10/14/2015   .  Hemiparesis affecting dominant side as late effect of stroke ([place])  10/14/2015   .  Depression  10/14/2015   .  Basal ganglia infarction ([place])  10/14/2015   .  Acute ischemic stroke ([place])     .  Left-sided weakness  10/12/2015   .  Abnormal CXR  10/12/2015   .  BPH associated with nocturia  08/20/2015   .  Aftercare following surgery of the circulatory system, [place]  02/13/2014   .  Postop check  02/06/2014   .  Left inguinal hernia  12/28/2013   .  Rib fractures  10/13/2013   .  Fall  10/13/2013   .  Dysplastic nevus of trunk  02/14/2013   .  Bicipital tendinitis of right shoulder  07/12/2012   .  Subacromial bursitis, right  03/28/2012   .  Right spastic hemiparesis ([place])  02/02/2012   .  Occlusion and stenosis of carotid artery with cerebral infarction  01/26/2012   .  Hemiplegia affecting right dominant side ([place])  12/24/2011   .  CVA (cerebral infarction)  12/15/2011   .  Occlusion and stenosis of carotid artery without mention of cerebral infarction  12/08/2011   .  Carotid artery plaque  12/04/2011   .  Left carotid bruit  11/30/2011   .  AUTONOMIC NEUROPATHY, DIABETIC  11/25/2010   .  Hyperlipidemia  03/15/2008   .  Obstructive sleep apnea  07/27/2007   .  Diabetes 1.5, managed as type 2 ([place])  06/15/2007   .  Essential hypertension  06/15/2007     Expected Discharge Date: Expected Discharge Date: 10/29/15  Team Members Present: Physician leading conference: Dr. Delice Lesch Social Worker Present: Ovidio Kin, LCSW Nurse Present: Dorien Chihuahua, RN PT Present: Georjean Mode, PT OT Present: Willeen Cass, OT SLP Present: Windell Moulding, SLP PPS Coordinator present  : Daiva Nakayama, RN, CRRN        Current Status/Progress  Goal  Weekly Team Focus   Medical     Left hemiplegia/dysarthria with gait deficits secondary to acute hemorrhagic right CVA as well as remote infarcts   Stabalize BP, educate with adaptive techniques, family education   see above   Bowel/Bladder     Continent of bowel and bladder. LBM 10/13/15   Pt to remain continent of bowel and bladder   Monitor   Swallow/Nutrition/ Hydration       na         ADL's     min A for UB B/D; mod A for LB B/D; Brunstrom IV for left UE, min A functional ambulation and transfers  supervision overall  transfers, activity tolerance, functional ambiulation, standing tolerance and balance, functional use/ NMR of left UE    Mobility     min assist transfers and gait x 150' and 8 low steps   S overall; min stairs  neuro re-ed, balance, pt and family ed, gait, activity tolerance    Communication       na         Safety/Cognition/ Behavioral Observations      no unsafe behaviors         Pain     No c/o pain  <3  Monitor for nonverbal cues of pain    Skin     Buttocks, pink, blanchable  No additional skin breakdow  Assist to turn q 2hrs      *See Care Plan and progress notes for long and short-term goals.    Barriers  to Discharge:  labile BP, safety awareness, family edu      Possible Resolutions to Barriers:   optimize meds, consider medical for spasticity, pt and family edu     Discharge Planning/Teaching Needs:   Home with wife who has provided care for pt before after last stroke. Both familiar with this program since has been here before.        Team Discussion:    Goals supervision le le for safety-some cognitive issues-delayed processing. No Speech seeing. Min assist with lower body bathing and dressing. Doing well in therapies. Elbow contracture-MD addressing   Revisions to Treatment Plan:    None    Continued Need for Acute Rehabilitation Level of Care: The patient requires daily  medical management by a physician with specialized training in physical medicine and rehabilitation for the following conditions: Daily direction of a multidisciplinary physical rehabilitation program to ensure safe treatment while eliciting the highest outcome that is of practical value to the patient.: Yes Daily medical management of patient stability for increased activity during participation in an intensive rehabilitation regime.: Yes Daily analysis of laboratory values and/or radiology reports with any subsequent need for medication adjustment of medical intervention for : Neurological problems  Elease Hashimoto 10/17/2015, 8:06 AM                  Patient ID: [name], male   DOB: Aug 27, 1946, 69 y.o.   MRN: [id number]

## 2015-10-24 NOTE — Progress Notes (Signed)
Social Work  Discharge Note  The overall goal for the admission was met for:   Discharge location: Yes-HOME WITH WIFE WHO CAN PROVIDE 24 HR SUPERVISION  Length of Stay: Yes-12 DAYS  Discharge activity level: Yes-SUPERVISION LEVEL  Home/community participation: Yes  Services provided included: MD, RD, PT, OT, SLP, RN, CM, TR, Pharmacy and SW  Financial Services: Medicare and Private Insurance: [place]  Follow-up services arranged: Outpatient: [facility]- PT & OT 1/5 9:15-11:00  Comments (or additional information):WIFE WAS IN FOR TRAINING THURSDAY AND COMFORTABLE WITH PT'S CARE NEEDS. WAS PROVIDING CARE AFTER LAST CVA LAST YEAR HAS ALL EQUIPMENT FROM PREVIOUS ADMITS  Patient/Family verbalized understanding of follow-up arrangements: Yes  Individual responsible for coordination of the follow-up plan: JANE-WIFE  Confirmed correct DME delivered: Elease Hashimoto 10/24/2015    Elease Hashimoto

## 2015-10-24 NOTE — Progress Notes (Signed)
Physical Therapy Session Note  Patient Details  Name: Pryor Revilla MRN: [id number] Date of Birth: 08-04-1946  Today's Date: 10/24/2015 PT Individual Time: 0905-1005 PT Individual Time Calculation (min): 60 min   Short Term Goals: Week 2: LTGs due to LOS      Skilled Therapeutic Interventions/Progress Updates:family ed with wife for simulated car transfer to [employer] [place] exs and standing hamstring and heel cord stretches, basic and toilet transfers with use of Lofstrand, gait on level tile and up/down curb/step and ramp. Bed> toilet and sink with wife guarding. Wife safely repeat demonstrated all items above.   Gait throughout unit with supervision.  Pt continues to placed hand on grip of Lofstrand of place in far forward during sit>< stand.  Wife realizes this is somewhat of a risk factor, but pt has great difficulty learning new techniques.  Pt insists this is the way he was taught to use it by PTs in [facility]. During stretching in standing, at end of session, pt had LOB moving from sink to recliner x 2', saying he was really tired.  Wife observed, and PT recommended pt limit prolonged activity at home until they both understand his limits. Pt left resting in recliner with all needs within reach. Wife present.    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   Therapy/Group: Individual Therapy  Shreyansh Tiffany 10/24/2015, 11:17 AM

## 2015-10-25 ENCOUNTER — Inpatient Hospital Stay (HOSPITAL_COMMUNITY): Payer: Medicare Other

## 2015-10-25 ENCOUNTER — Inpatient Hospital Stay (HOSPITAL_COMMUNITY): Payer: Medicare Other | Admitting: Occupational Therapy

## 2015-10-25 LAB — GLUCOSE, CAPILLARY
GLUCOSE-CAPILLARY: 122 mg/dL — AB (ref 65–99)
GLUCOSE-CAPILLARY: 110 mg/dL — AB (ref 65–99)
GLUCOSE-CAPILLARY: 93 mg/dL (ref 65–99)
Glucose-Capillary: 121 mg/dL — ABNORMAL HIGH (ref 65–99)
Glucose-Capillary: 101 mg/dL — ABNORMAL HIGH (ref 65–99)

## 2015-10-25 MED ORDER — ADULT MULTIVITAMIN W/MINERALS CH: 1.0000 | ORAL_TABLET | Freq: Every day | ORAL | Status: DC

## 2015-10-25 MED ORDER — BACLOFEN 10 MG PO TABS: 10.0000 mg | ORAL_TABLET | Freq: Three times a day (TID) | ORAL | Status: DC

## 2015-10-25 MED ORDER — MIRABEGRON ER 25 MG PO TB24: 25.0000 mg | ORAL_TABLET | Freq: Every day | ORAL | Status: DC

## 2015-10-25 MED ORDER — LISINOPRIL 20 MG PO TABS: 10.0000 mg | ORAL_TABLET | Freq: Every day | ORAL | Status: DC

## 2015-10-25 MED ORDER — LATANOPROST 0.005 % OP SOLN: 1.0000 [drp] | Freq: Every day | OPHTHALMIC | Status: AC

## 2015-10-25 NOTE — Progress Notes (Signed)
Occupational Therapy Session Note  Patient Details  Name: Darrell [name] MRN: [id number] Date of Birth: [birth year]  Today's Date: 10/25/2015 OT Individual Time: [id number] OT Individual Time Calculation (min): 75 min    Short Term Goals: Week 2:  OT Short Term Goal 1 (Week 2): STG=LTG secondary to ELOS  Skilled Therapeutic Interventions/Progress Updates:    Pt worked on bathing, grooming, and dressing during session.  Had him gather his clothing prior to starting with mod questioning cues.  Pt had gathered 2 shirts instead of 1 pair of pants and a shirt, but was unaware of it.  Ambulated to the shower with use of the Lofstrand crutch and supervision.  He was able to complete showering with overall supervision but needs min instructional cueing to remember to wash all parts as well as to dry off as much as possible before attempting to stand and walk out to the wheelchair.  Pt still with very little attempted use of the RUE but used the LUE throughout session without difficulty during grooming and selfcare tasks.  He was able to complete all dressing with supervision except for donning TEDs, which therapist assisted him with.  Once finished had him ambulate to the therapy gym for further work on dynamic balance.  Had him work in standing using foam surface while reaching and placing clothespins with the LUE.  Min assist to transition from squat to stand after picking up the clothespins.  Worked on stepping on and off of the foam surface as well, with pt needing min assist with stepping posteriorly off of the surface.  Also worked on stepping over a 5 inch wide barrier to simulate shower transfer.  He was able to complete with supervision.  Finished session with return to the room and work on cleaning up towels and dirty laundry from previous shower.  Pt did utilize the RUE as a stabilizer for holding clothing while he walked with the crutch.  Min instructional cueing to problem solve this however.   Pt left in bedside chair with call button and phone within reach.    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   Therapy/Group: Individual Therapy  Ryo Klang OTR/L 10/25/2015, 12:29 PM

## 2015-10-25 NOTE — Progress Notes (Signed)
Occupational Therapy Discharge Summary  Patient Details  Name: Darrell [name] MRN: [id number] Date of Birth: 02-03-1946  Today's Date: 10/25/2015 OT Individual Time: 1300-1400 OT Individual Time Calculation (min): 60 min   Session note:  Pt worked on functional mobility, toileting, FM coordination bilaterally during session.  Pt still needed constant reminders during FM tasks to pick up with his digits and translate to palm.  He tends to try and pick up multiple items at one time without translating to palm from fingertips.  Pt given FM coordination handout for home as well.    Patient has met 11 of 11 long term goals due to improved balance, ability to compensate for deficits, functional use of  LEFT upper and LEFT lower extremity and improved coordination.  Patient to discharge at overall Supervision level.  Patient's care partner is independent to provide the necessary physical and cognitive assistance at discharge.    Reasons goals not met: NA  Recommendation:  Patient will benefit from ongoing skilled OT services in outpatient setting to continue to advance functional skills in the area of BADL.  Pt still demonstrates increased risk of fall with functional selfcare tasks and mobility.  In addition, LUE coordination is still slightly impaired as well as selective attention and memory.  Feel he will need ongoing outpatient level OT to further progress to a more independent level.  Pt's spouse educated on safe assist with selfcare tasks and functional transfers.    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   Cognition Overall Cognitive Status: Impaired/Different from baseline Arousal/Alertness: Awake/alert Orientation Level: Oriented X4 Attention: Sustained Sustained Attention: Appears intact Memory: Impaired Memory Impairment: Decreased recall of new information;Decreased short term memory;Decreased long term memory Decreased Long Term Memory: Functional basic Decreased Short Term Memory: Functional basic Awareness: Appears intact Problem Solving: Impaired Problem Solving Impairment: Functional basic;Functional complex Behaviors: Verbal agitation (Pt at times becomes frustrated with difficult tasks if he initially is unsuccessful) Safety/Judgment: Impaired Sensation Sensation Light Touch: Appears Intact Stereognosis: Impaired Detail Stereognosis Impaired Details: Impaired RUE Proprioception: Impaired Detail Proprioception Impaired Details: Impaired RUE Coordination Gross Motor Movements are Fluid and Coordinated: No Fine Motor Movements are Fluid and Coordinated: No Coordination and Movement Description: 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  Slight decreased FM and gross motor function in the LUE but uses functionally for selfcare tasks and self feeding with increased time.  Finger Nose Finger Test: Slight dysmetria noted in the LUE for finger to nose testing.  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  Stand to Sit: 6: Modified independent (Device/Increase time)  Trunk/Postural Assessment  Cervical Assessment Cervical Assessment: Exceptions to [facility] (cervical protraction) Thoracic Assessment Thoracic Assessment: Exceptions to [facility] (Thoracic kyphosis) Lumbar Assessment Lumbar Assessment: Exceptions to [facility] (Pt sits in posterior pelvic tilt with increased lumbar flexion) Postural Control Postural Control: Deficits on evaluation (delayed and inadequate ankle/hip and stepping strategies; R.>) Trunk Control: posterior lean impr0ved  Balance Balance Balance Assessed: Yes Static Sitting Balance Static Sitting - Level of Assistance: 6: Modified independent (Device/Increase time) Dynamic Sitting Balance Dynamic Sitting - Level of Assistance: 6: Modified independent (Device/Increase time) Static Standing Balance Static Standing - Level of Assistance: 6: Modified independent (Device/Increase time) Dynamic Standing Balance Dynamic Standing - Level of Assistance: 5: Stand by assistance Extremity/Trunk Assessment RUE Assessment RUE Assessment: Exceptions to [facility] RUE Strength RUE Overall Strength Comments: Brunnstrum stage IV movement in the arm and hand from previous CVA.  Pt with increased flexor tone noted in the elbow and digits.  Decreased attempted use with most functional tasks even when cued to try.  Limited AAROM shoulder flexion to 70 degrees with decreased scapula upward rotation and increased pain at glenohumeral joint.   RUE  Tone RUE Tone: Modified Ashworth Modified Ashworth Scale for Grading Hypertonia RUE: More marked increase in muscle tone through most of the ROM, but affected part(s) easily moved LUE Assessment LUE Assessment: Exceptions to [facility] LUE Strength LUE Overall Strength Comments: AROM WFLS for all joints, slight dysmetria noted with finger to nose tesing as well as decreased FM capabilities with in-hand manipulation such as translation from palm to fingertips and back.     See Function Navigator for Current Functional Status.  Fredrick Dray OTR/L 10/25/2015, 1:45 PM

## 2015-10-25 NOTE — Progress Notes (Addendum)
Physical Therapy Discharge Summary  Patient Details  Name: Darrell [name] MRN: [id number] Date of Birth: 04-Nov-1945  Today's Date: 10/25/2015 PT Individual Time: 0800-0905 PT Individual Time Calculation (min): 65 min    Patient has met 8 of 8 long term goals due to improved activity tolerance, improved balance, improved postural control, increased strength, ability to compensate for deficits, functional use of  right upper extremity, right lower extremity, left upper extremity and left lower extremity and improved awareness.  Patient to discharge at an ambulatory level Supervision.   Patient's care partner is independent to provide the necessary cognitive assistance at discharge.  Reasons goals not met: n/a  Recommendation:  Patient will benefit from ongoing skilled PT services in outpatient setting to continue to advance safe functional mobility, address ongoing impairments in balance, activity tolerance, hypertonus RLE, awareness, and minimize fall risk.  Equipment: No equipment provided  Reasons for discharge: treatment goals met and discharge from hospital  Patient/family agrees with progress made and goals achieved: Yes   tx today: Patient demonstrates increased fall risk as noted by score of 39  /56 on Berg Balance Scale.  (<36= high risk for falls, close to 100%; 37-45 significant >80%; 46-51 moderate >50%; 52-55 lower >25%), and Normal Tug 19.9.  Falls risk discussed again with pt.  Gait on level tile, ramp, curb, 12 steps, simulated car transfer, toilet transfer.  Pt returned to room and left resting in w/c with all needs in place. pt returned to room and left resting with all needs within reach.   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; new L weakness, cognitive impairment Restrictions Weight Bearing Restrictions: No Pain Pain Assessment Pain Assessment: No/denies pain Vision/Perception - no  change; see OT repoet     Cognition Arousal/Alertness: Awake/alert Orientation Level: Oriented X4 Memory: Impaired Memory Impairment: Decreased recall of new information;Decreased long term memory;Decreased short term memory Safety/Judgment: Impaired Sensation Sensation Light Touch: Appears Intact (BLE) Proprioception: Appears Intact Coordination Gross Motor Movements are Fluid and Coordinated: No Heel Shin Test: difficulty noted R and L Motor  Motor Motor: Hemiplegia;Abnormal postural alignment and control;Abnormal tone Motor - Skilled Clinical Observations: 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 Motor - Discharge Observations: 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  Mobility Bed Mobility Bed Mobility:  (modified independent for all) Transfers Transfers: Yes Sit to Stand: 6: Modified independent (Device/Increase time) Stand to Sit: 6: Modified independent (Device/Increase time) Stand step transfers with L Lofstrand: pt continues to be inconsistent with L hand placement during transfers due to poor memory for new information, but he is safe Locomotion  Ambulation Ambulation: Yes Ambulation/Gait Assistance: 5: Supervision Ambulation Distance (Feet): 150 Feet Assistive device: Lofstrands (L) Ambulation/Gait Assistance Details: Verbal cues for safe use of DME/AE Gait Gait: Yes Gait Pattern: Impaired Gait Pattern: Decreased trunk rotation;Narrow base of support;Trunk flexed;Poor foot clearance - right;Decreased dorsiflexion - right;Decreased hip/knee flexion - right.  As he fatigues, R spasticity increases and R hip adducts more during swing phase= narrow BOS Gait velocity: 10 MWT 2.2'/sec, neighborhood ambulation High Level Ambulation High Level Ambulation: Backwards walking;Side stepping Side Stepping: close S Backwards Walking: close S Stairs / Additional Locomotion Stairs: Yes Stairs Assistance: 5: Supervision Stair Management  Technique: One rail Left Number of Stairs: 12 Height of Stairs: 6 (and 3") Ramp: 5: Supervision Curb: 5: Supervision Wheelchair Mobility Wheelchair Mobility: No  Trunk/Postural Assessment  Cervical Assessment Cervical Assessment:  (forward head) Thoracic Assessment Thoracic Assessment:  (  flexed posture) Lumbar Assessment Lumbar Assessment:  (posterior pelvic tilt) Postural Control Postural Control: Deficits on evaluation (delayed and inadequate ankle/hip and stepping strategies; R.>) Trunk Control: posterior lean impr0ved  Balance Standardized Balance Assessment Standardized Balance Assessment: Berg Balance Test;Timed Up and Go Test Berg Balance Test Sit to Stand: Able to stand without using hands and stabilize independently Standing Unsupported: Able to stand safely 2 minutes Sitting with Back Unsupported but Feet Supported on Floor or Stool: Able to sit safely and securely 2 minutes Stand to Sit: Sits safely with minimal use of hands Transfers: Able to transfer safely, minor use of hands Standing Unsupported with Eyes Closed: Able to stand 10 seconds with supervision Standing Ubsupported with Feet Together: Able to place feet together independently and stand for 1 minute with supervision From Standing, Reach Forward with Outstretched Arm: Can reach forward >5 cm safely (2") From Standing Position, Pick up Object from Floor: Able to pick up shoe, needs supervision From Standing Position, Turn to Look Behind Over each Shoulder: Looks behind one side only/other side shows less weight shift Turn 360 Degrees: Needs close supervision or verbal cueing Standing Unsupported, Alternately Place Feet on Step/Stool: Able to complete >2 steps/needs minimal assist Standing Unsupported, One Foot in Front: Able to take small step independently and hold 30 seconds Standing on One Leg: Tries to lift leg/unable to hold 3 seconds but remains standing independently Total Score: 39 Timed Up and Go  Test TUG: Normal TUG Normal TUG (seconds): 19.9 (24,18,17, using L Lofstrand) Static Sitting Balance Static Sitting - Level of Assistance: 6: Modified independent (Device/Increase time) Dynamic Sitting Balance Dynamic Sitting - Level of Assistance: 6: Modified independent (Device/Increase time) Static Standing Balance Static Standing - Level of Assistance: 6: Modified independent (Device/Increase time) Dynamic Standing Balance Dynamic Standing - Level of Assistance: 5: Stand by assistance Extremity Assessment      RLE Assessment RLE Assessment: Exceptions to [facility] RLE Strength RLE Overall Strength Comments: grossly in sitting 4+/5 hip and knee 4/5 ankle DF RLE Tone RLE Tone Comments: hypertonus noted hamsttrings; spasticiity noted during gait LLE Assessment LLE Assessment: Exceptions to [facility] LLE Strength LLE Overall Strength Comments: grossly in sitting: hip fles, adduction, abduction 4+/5, knee ext/flex 5/5; ankle DF 4/5   See Function Navigator for Current Functional Status.  Muad Noga 10/25/2015, 10:48 AM

## 2015-10-25 NOTE — Progress Notes (Signed)
[place] PHYSICAL MEDICINE & REHABILITATION     PROGRESS NOTE    Subjective/Complaints:  Slept well, 4 recorded voids yesterday  ROS: Denies  urinary urgency, dysuria,CP, SOB, n/v/d.   Objective: Vital Signs: Blood pressure 142/77, pulse 61, temperature 98.3 F (36.8 C), temperature source Oral, resp. rate 18, height '5\' 10"'$  (1.778 m), weight 88.678 kg (195 lb 8 oz), SpO2 95 %. No results found. No results for input(s): WBC, HGB, HCT, PLT in the last 72 hours. No results for input(s): NA, K, CL, GLUCOSE, BUN, CREATININE, CALCIUM in the last 72 hours.  Invalid input(s): CO CBG (last 3)   Recent Labs  10/24/15 1631 10/24/15 2103 10/25/15 0637  GLUCAP 111* 129* 121*    Wt Readings from Last 3 Encounters:  10/23/15 88.678 kg (195 lb 8 oz)  10/12/15 93.441 kg (206 lb)  08/20/15 94.802 kg (209 lb)    Physical Exam:  BP 142/77 mmHg  Pulse 61  Temp(Src) 98.3 F (36.8 C) (Oral)  Resp 18  Ht '5\' 10"'$  (1.778 m)  Wt 88.678 kg (195 lb 8 oz)  BMI 28.05 kg/m2  SpO2 95% Constitutional: He appears well-developed and well-nourished. Vital signs reviewed.  HENT: Normocephalic and atraumatic.  Eyes: Conjunctivae and EOM are normal.  Neck: Normal range of motion. No thyromegaly present.  Cardiovascular: Normal rate and regular rhythm.  Respiratory: Effort normal and breath sounds normal. No respiratory distress.  GI: Soft. Bowel sounds are normal. He exhibits no distension.  Musculoskeletal: He exhibits no edema or tenderness.  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  Follows commands Sensation intact to light touch Motor: RUE: 4+/5 proximal to distal, flexor spasticity at elbow, extensor tone in 2nd digit, decreased fine motor in RIght hand LUE: Shoulder abduction, elbow flexion and extension 4+/5, hand grip 3+/5 B/L LE: 4/5  Skin: Skin is warm and dry.  Psychiatric: He has a normal mood and affect. His  behavior is normal  Assessment/Plan: 1. 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 Physiatrist is providing close team supervision and 24 hour management of active medical problems listed below. Physiatrist and rehab team continue to assess barriers to discharge/monitor patient progress toward functional and medical goals. Plan d/c in am  Function:  Bathing Bathing position   Position: Shower  Bathing parts Body parts bathed by patient: Right arm, Left arm, Chest, Abdomen, Front perineal area, Right upper leg, Left upper leg, Buttocks, Right lower leg, Left lower leg Body parts bathed by helper: Back  Bathing assist Assist Level: Touching or steadying assistance(Pt > 75%)      Upper Body Dressing/Undressing Upper body dressing   What is the patient wearing?: Pull over shirt/dress     Pull over shirt/dress - Perfomed by patient: Thread/unthread left sleeve, Put head through opening, Thread/unthread right sleeve, Pull shirt over trunk Pull over shirt/dress - Perfomed by helper: Thread/unthread right sleeve, Pull shirt over trunk        Upper body assist Assist Level: Touching or steadying assistance(Pt > 75%)      Lower Body Dressing/Undressing Lower body dressing   What is the patient wearing?: Underwear, Pants, [employer], Shoes Underwear - Performed by patient: Thread/unthread right underwear leg, Thread/unthread left underwear leg, Pull underwear up/down Underwear - Performed by helper: Pull underwear  up/down Pants- Performed by patient: Thread/unthread right pants leg, Thread/unthread left pants leg, Pull pants up/down, Fasten/unfasten pants Pants- Performed by helper: Pull pants up/down       Socks - Performed by helper: Don/doff right sock, Don/doff left sock Shoes  - Performed by patient: Don/doff right shoe, Don/doff left shoe Shoes - Performed by helper: Don/doff right shoe       TED Hose - Performed by helper: Don/doff right TED hose, Don/doff left TED hose  Lower body assist Assist for lower body dressing: Supervision or verbal cues      Toileting Toileting   Toileting steps completed by patient: Performs perineal hygiene, Adjust clothing prior to toileting, Adjust clothing after toileting Toileting steps completed by helper: Adjust clothing after toileting Toileting Assistive Devices: Grab bar or rail  Toileting assist Assist level: Supervision or verbal cues   Transfers Chair/bed transfer   Chair/bed transfer method: Stand pivot Chair/bed transfer assist level: Supervision or verbal cues Chair/bed transfer assistive device: Librarian, academic     Max distance: 150 Assist level: Supervision or verbal cues   Wheelchair     Max wheelchair distance: 120 Assist Level: No help, No cues, assistive device, takes more than reasonable amount of time  Cognition Comprehension Comprehension assist level: Understands basic 90% of the time/cues < 10% of the time  Expression Expression assist level: Expresses basic 90% of the time/requires cueing < 10% of the time.  Social Interaction Social Interaction assist level: Interacts appropriately with others with medication or extra time (anti-anxiety, antidepressant).  Problem Solving Problem solving assist level: Solves basic 50 - 74% of the time/requires cueing 25 - 49% of the time  Memory Memory assist level: Recognizes or recalls 25 - 49% of the time/requires cueing 50 - 75% of the time    Medical Problem List and Plan: 1. 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  Team conference today please see physician documentation under team conference  tab, met with team face-to-face to discuss problems,progress, and goals. Formulized individual treatment plan based on medical history, underlying problem and comorbidities. 2. DVT Prophylaxis/Anticoagulation: Subcutaneous Lovenox. Monitor platelet counts for any signs of bleeding 3. Pain Management: Tylenol as needed 4. Mood: Celexa 10 mg daily. Provide emotional support 5. Neuropsych: This patient is capable of making decisions on his own behalf. 6. Skin/Wound Care: Routine skin checks 7. Fluids/Electrolytes/Nutrition: Routine I&O  BMET normal 12/26 8. Hypertension. Controlled on lisinopril and hydralazine   9. Diabetes mellitus with peripheral neuropathy. Hemoglobin A1c 6.9. Glucophage 1000 milligrams twice a day. Check blood sugars before meals and at bedtime. Diabetic teaching  Overall, fairly well controlled, fasting CBG 113 this AM 10. Residual spasticity RUE: Will educate on stretching techniques and splinting and consider medications if necessary  Pt states he has tried botox without relief.has underlying contracture  Started on baclofen on 12/22. Not sure this has had a sig effect will make prn 11. Hypoalbuminemia: Will start protein supplementation and periodically monitor, enc po 12.  Urinary freq without evidence of infection will check PVR, trial Myrbetriq for spastic bladder  LOS (Days) 11 A FACE TO FACE EVALUATION WAS PERFORMED  Nyeema Want E 10/25/2015 7:04 AM

## 2015-10-25 NOTE — Discharge Instructions (Signed)
Inpatient Rehab Discharge Instructions  Tameem Hockenbury Discharge date and time: 10/25/15   Activities/Precautions/ Functional Status: Activity: activity as tolerated Diet: diabetic diet Wound Care: none needed   Functional status:  ___ No restrictions     ___ Walk up steps independently _X__ 24/7 supervision/assistance   ___ Walk up steps with assistance ___ Intermittent supervision/assistance  ___ Bathe/dress independently ___ Walk with walker     ___ Bathe/dress with assistance ___ Walk Independently    ___ Shower independently _X__ Walk with assistance    _X__ Shower with assistance _X__ No alcohol     ___ Return to work/school ________  Special Instructions: 1. Check blood sugars twice a day before meals.  2. No driving   COMMUNITY REFERRALS UPON DISCHARGE:    Outpatient: PT & OT  Agency:[facility] Phone:[phone]   Date of Last Service:10/26/2015  Appointment Date/Time:JANUARY 5-Thursday  9:15-11:00 AM  Medical Equipment/Items Ordered:HAS ALL NEEDED EQUIPMENT FROM PREVIOUS ADMITS     GENERAL COMMUNITY RESOURCES FOR PATIENT/FAMILY: Support Groups:[facility] SUPPORT GROUP  STROKE/TIA DISCHARGE INSTRUCTIONS SMOKING Cigarette smoking nearly doubles your risk of having a stroke & is the single most alterable risk factor  If you smoke or have smoked in the last 12 months, you are advised to quit smoking for your health.  Most of the excess cardiovascular risk related to smoking disappears within a year of stopping.  Ask you doctor about anti-smoking medications  [place] Quit Line: 1-800-QUIT NOW  Free Smoking Cessation Classes [phone]  CHOLESTEROL Know your levels; limit fat & cholesterol in your diet  Lipid Panel     Component Value Date/Time   CHOL 76 10/13/2015 0258   TRIG 96 10/13/2015 0258   TRIG 246* 10/05/2006 0904   HDL 17* 10/13/2015 0258   CHOLHDL 4.5 10/13/2015 0258   CHOLHDL 4.7 CALC 10/05/2006 0904   VLDL 19 10/13/2015 0258   LDLCALC 40 10/13/2015 0258      Many patients benefit from treatment even if their cholesterol is at goal.  Goal: Total Cholesterol (CHOL) less than 160  Goal:  Triglycerides (TRIG) less than 150  Goal:  HDL greater than 40  Goal:  LDL (LDLCALC) less than 100   BLOOD PRESSURE American Stroke Association blood pressure target is less that 120/80 mm/Hg  Your discharge blood pressure is:  BP: (!) 142/77 mmHg  Monitor your blood pressure  Limit your salt and alcohol intake  Many individuals will require more than one medication for high blood pressure  DIABETES (A1c is a blood sugar average for last 3 months) Goal HGBA1c is under 7% (HBGA1c is blood sugar average for last 3 months)  Diabetes:     Lab Results  Component Value Date   HGBA1C 6.9* 10/13/2015     Your HGBA1c can be lowered with medications, healthy diet, and exercise.  Check your blood sugar as directed by your physician  Call your physician if you experience unexplained or low blood sugars.  PHYSICAL ACTIVITY/REHABILITATION Goal is 30 minutes at least 4 days per week  Activity: Increase activity slowly, Therapies: See above Return to work: N/A  Activity decreases your risk of heart attack and stroke and makes your heart stronger.  It helps control your weight and blood pressure; helps you relax and can improve your mood.  Participate in a regular exercise program.  Talk with your doctor about the best form of exercise for you (dancing, walking, swimming, cycling).  DIET/WEIGHT Goal is to maintain a healthy weight  Your discharge diet is: Diet heart healthy/carb modified Room service appropriate?: Yes; Fluid consistency:: Thin  liquids Your height is:  Height: 5\' 10"  (177.8 cm) Your current weight is: Weight: 88.678 kg (195 lb 8 oz) Your Body Mass Index (BMI) is:  BMI (Calculated): 29.8  Following the type of diet specifically designed for you will help prevent another stroke.  Your goal weight range is:   174 lbs  Your goal Body Mass Index (BMI) is 19-24.  Healthy food habits can help reduce 3 risk factors for stroke:  High cholesterol, hypertension, and excess weight.  RESOURCES Stroke/Support Group:  Call [phone]   STROKE EDUCATION PROVIDED/REVIEWED AND GIVEN TO PATIENT Stroke warning signs and symptoms How to activate emergency medical system (call 911). Medications prescribed at discharge. Need for follow-up after discharge. Personal risk factors for stroke. Pneumonia vaccine given:  Flu vaccine given:  My questions have been answered, the writing is legible, and I understand these instructions.  I will adhere to these goals & educational materials that have been provided to me after my discharge from the hospital.      My questions have been answered and I understand these instructions. I will adhere to these goals and the provided educational materials after my discharge from the hospital.  Patient/Caregiver Signature _______________________________ Date __________  Clinician Signature _______________________________________ Date __________  Please bring this form and your medication list with you to all your follow-up doctor's appointments.

## 2015-10-26 LAB — GLUCOSE, CAPILLARY: Glucose-Capillary: 105 mg/dL — ABNORMAL HIGH (ref 65–99)

## 2015-10-26 NOTE — Progress Notes (Signed)
Patient discharged to home with all belongings and discharge instructions about 1145. Discharge papers reviewed with patient and spouse. All questions answered.

## 2015-10-26 NOTE — Progress Notes (Signed)
[place] PHYSICAL MEDICINE & REHABILITATION     PROGRESS NOTE    Subjective/Complaints:  Patient seen this Morning ambulating with his crutch to the restroom. He states he slept well overnight and is looking forward to going home today.  ROS: Denies  urinary urgency, dysuria,CP, SOB, n/v/d.   Objective: Vital Signs: Blood pressure 113/74, pulse 65, temperature 98.2 F (36.8 C), temperature source Oral, resp. rate 18, height 5\' 10"  (1.778 m), weight 88.678 kg (195 lb 8 oz), SpO2 92 %. No results found. No results for input(s): WBC, HGB, HCT, PLT in the last 72 hours. No results for input(s): NA, K, CL, GLUCOSE, BUN, CREATININE, CALCIUM in the last 72 hours.  Invalid input(s): CO CBG (last 3)   Recent Labs  10/25/15 1626 10/25/15 2049 10/26/15 0629  GLUCAP 101* 93 105*    Wt Readings from Last 3 Encounters:  10/23/15 88.678 kg (195 lb 8 oz)  10/12/15 93.441 kg (206 lb)  08/20/15 94.802 kg (209 lb)    Physical Exam:  BP 113/74 mmHg  Pulse 65  Temp(Src) 98.2 F (36.8 C) (Oral)  Resp 18  Ht 5\' 10"  (1.778 m)  Wt 88.678 kg (195 lb 8 oz)  BMI 28.05 kg/m2  SpO2 92% Constitutional: He appears well-developed and well-nourished. Vital signs reviewed.  HENT: Normocephalic and atraumatic.  Eyes: Conjunctivae and EOM are normal.  Neck: Normal range of motion. No thyromegaly present.  Cardiovascular: Normal rate and regular rhythm.  Respiratory: Effort normal and breath sounds normal. No respiratory distress.  GI: Soft. Bowel sounds are normal. He exhibits no distension.  Musculoskeletal: He exhibits no edema or tenderness.  Flexion contracture right elbow ~75 deg. R hand MCP contractures Neurological: He is alert and oriented.  Follows commands Sensation intact to light touch Motor: RUE: 4+/5 proximal to distal, flexor spasticity at elbow, extensor tone in 2nd digit, decreased fine motor in RIght hand LUE: Shoulder abduction, elbow flexion and extension 4+/5, hand  grip 3+/5 B/L LE: 4/5  Skin: Skin is warm and dry.  Psychiatric: He has a normal mood and affect. His behavior is normal  Assessment/Plan: 1. 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 Physiatrist is providing close team supervision and 24 hour management of active medical problems listed below. Physiatrist and rehab team continue to assess barriers to discharge/monitor patient progress toward functional and medical goals. Plan d/c in am  Function:  Bathing Bathing position   Position: Shower  Bathing parts Body parts bathed by patient: Right arm, Left arm, Chest, Abdomen, Front perineal area, Buttocks, Right upper leg, Left upper leg, Right lower leg, Left lower leg Body parts bathed by helper: Back  Bathing assist Assist Level: Supervision or verbal cues      Upper Body Dressing/Undressing Upper body dressing   What is the patient wearing?: Pull over shirt/dress     Pull over shirt/dress - Perfomed by patient: Thread/unthread left sleeve, Put head through opening, Thread/unthread right sleeve, Pull shirt over trunk Pull over shirt/dress - Perfomed by helper: Thread/unthread right sleeve, Pull shirt over trunk        Upper body assist Assist Level: Touching or steadying assistance(Pt > 75%)      Lower Body Dressing/Undressing Lower body dressing   What is the patient wearing?: Underwear, Pants, Shoes, [employer]  - Performed by patient:  Thread/unthread right underwear leg, Thread/unthread left underwear leg, Pull underwear up/down Underwear - Performed by helper: Pull underwear up/down Pants- Performed by patient: Thread/unthread right pants leg, Thread/unthread left pants leg, Pull pants up/down, Fasten/unfasten pants Pants- Performed by helper: Pull pants  up/down       Socks - Performed by helper: Don/doff right sock, Don/doff left sock Shoes - Performed by patient: Don/doff right shoe, Don/doff left shoe Shoes - Performed by helper: Don/doff right shoe       TED Hose - Performed by helper: Don/doff right TED hose, Don/doff left TED hose  Lower body assist Assist for lower body dressing: Supervision or verbal cues      Toileting Toileting   Toileting steps completed by patient: Adjust clothing prior to toileting, Performs perineal hygiene, Adjust clothing after toileting Toileting steps completed by helper: Adjust clothing after toileting Toileting Assistive Devices: Grab bar or rail  Toileting assist Assist level: Supervision or verbal cues   Transfers Chair/bed transfer   Chair/bed transfer method: Stand pivot Chair/bed transfer assist level: No Help, no cues, assistive device, takes more than a reasonable amount of time Chair/bed transfer assistive device: Other (L [place])     Locomotion Ambulation     Max distance: 150 Assist level: Supervision or verbal cues   Wheelchair     Max wheelchair distance: 120 Assist Level: No help, No cues, assistive device, takes more than reasonable amount of time  Cognition Comprehension Comprehension assist level: Understands basic 90% of the time/cues < 10% of the time  Expression Expression assist level: Expresses basic 90% of the time/requires cueing < 10% of the time.  Social Interaction Social Interaction assist level: Interacts appropriately with others with medication or extra time (anti-anxiety, antidepressant).  Problem Solving Problem solving assist level: Solves basic 50 - 74% of the time/requires cueing 25 - 49% of the time  Memory Memory assist level: Recognizes or recalls 25 - 49% of the time/requires cueing 50 - 75% of the time    Medical Problem List and Plan: 1. Left hemiplegia/dysarthria with gait deficits secondary to acute hemorrhagic white matter infarct right  centrum semiovale-as well as remote lacunar infarcts basal ganglia and Left MCA.  DC today 2. DVT Prophylaxis/Anticoagulation: Subcutaneous Lovenox. Monitor platelet counts for any signs of bleeding 3. Pain Management: Tylenol as needed 4. Mood: Celexa 10 mg daily. Provide emotional support 5. Neuropsych: This patient is capable of making decisions on his own behalf. 6. Skin/Wound Care: Routine skin checks 7. Fluids/Electrolytes/Nutrition: Routine I&O  BMET normal 12/26 8. Hypertension. Controlled on lisinopril and hydralazine 9. Diabetes mellitus with peripheral neuropathy. Hemoglobin A1c 6.9. Glucophage 1000 milligrams twice a day. Check blood sugars before meals and at bedtime. Diabetic teaching  Overall, fairly well controlled, fasting CBG 105 this AM 10. Residual spasticity RUE: Will educate on stretching techniques and splinting and consider medications if necessary  Pt states he has tried botox without relief.has underlying contracture  Started on baclofen on 12/22. Not sure this has had a sig effect will make prn 11. Hypoalbuminemia: Continue Protein supplementation, enc po 12.  Urinary freq without evidence of infection, trial Myrbetriq for spastic bladder  LOS (Days) 12 A FACE TO FACE EVALUATION WAS PERFORMED  Teressa Mcglocklin Lorie Phenix 10/26/2015 7:29 AM

## 2015-10-27 NOTE — Discharge Summary (Signed)
Discharge summary job # [id number]

## 2015-10-28 NOTE — Discharge Summary (Signed)
NAME:  Darrell [name], Darrell [name]          ACCOUNT NO.:  [id number]  MEDICAL RECORD NO.:  [id number]  LOCATION:  4M03C                        FACILITY:  [facility]  PHYSICIAN:  Lauraine Rinne, P.A.  DATE OF BIRTH:  [birth year]  DATE OF ADMISSION:  10/14/2015 DATE OF DISCHARGE:  10/26/2015                              DISCHARGE SUMMARY   DISCHARGE DIAGNOSES: 1. Left hemiplegia and dysarthria, secondary to acute hemorrhagic     white matter infarct. 2. Subcutaneous Lovenox for DVT prophylaxis. 3. Depression. 4. Hypertension. 5. Diabetes mellitus and peripheral neuropathy. 6. Urinary frequency.  HISTORY OF PRESENT ILLNESS:  This is a 70 year old right-handed male with history of remote tobacco abuse, hypertension, diabetes mellitus. 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  He lives with his wife, two-level townhouse.  Used a cane prior to admission.  Presented on October 12, 2015 with left-sided weakness and slurred speech.  MRI of the brain showed acute nonhemorrhagic white matter infarct as well as progressive remote lacunar infarct basal ganglia on the left.  The patient did not receive tPA.  MRA of the head with mild medium and distal small vessel disease without significant stenosis or occlusion.  Echocardiogram with ejection fraction of 123456, grade 1 diastolic dysfunction.  Carotid Dopplers with no ICA stenosis.  Placed on aspirin per Neurology Services for CVA prophylaxis, subcutaneous Lovenox for DVT prophylaxis.  Physical and occupation therapy ongoing.  The patient was admitted for comprehensive rehab program.  PAST MEDICAL HISTORY:  See discharge diagnoses.  SOCIAL HISTORY:  Lives with spouse.  Used a cane prior to admission.  FUNCTIONAL STATUS:  Upon admission to rehab services was moderate assist, stand pivot transfers; mod-to-max assist, 100 feet rolling walker;  min-to-mod assist, activities of daily living.  PHYSICAL EXAMINATION:  VITAL SIGNS:  Blood pressure 145/84, pulse 71, temperature 97, respirations 18. GENERAL:  This was an alert male, oriented x3. LUNGS:  Clear to auscultation without wheeze. CARDIAC:  Regular rate and rhythm without murmur. ABDOMEN:  Soft, nontender.  Good bowel sounds.  REHABILITATION HOSPITAL COURSE:  The patient was admitted to Inpatient Rehab Services with therapies initiated on a 3-hour daily basis consisting of physical therapy, occupational therapy, and rehabilitation nursing.  The following issues were addressed during the patient's rehabilitation stay.  Pertaining to Mr. Whichard, acute hemorrhagic white matter infarct remained stable.  He would follow up Neurology Services, maintained on aspirin therapy.  Subcutaneous Lovenox for DVT prophylaxis.  Tolerating a regular consistency diet.  Blood pressures well controlled on lisinopril as well as hydralazine.  No orthostatic changes.  Diabetes mellitus, peripheral neuropathy.  Hemoglobin A1c 6.9. Remained on 1000 mg twice daily of Glucophage.  Full diabetic teaching. He did have some residual spasticity of right upper extremity from CVA, started on baclofen on October 17, 2015.  He had tried Botox injections in the past.  The patient received weekly collaborative interdisciplinary team conferences to discuss estimated length of stay, family teaching, and any barriers to discharge.  The patient improved activity tolerance, improved balance, improved postural control, increased strength, ability to compensate for his deficits.  Worked with full stretching exercises,  gait throughout unit with supervision.  He could gather his belongings for activities of daily living and homemaking.  Demonstrates increased risk of falls at times.  Discussed the need for supervision.  Full family teaching was completed and plan discharge to home.  DISCHARGE MEDICATIONS: 1.  Aspirin 325 mg p.o. daily. 2. Baclofen 10 mg p.o. t.i.d. 3. Celexa 10 mg p.o. daily. 4. Xalatan ophthalmic solution 1 drop both eyes at bedtime. 5. Lisinopril 20 mg p.o. daily. 6. Glucophage 1000 mg p.o. daily. 7. Myrbetriq 25 mg p.o. daily. 8. Multivitamin 1 tablet daily.  DIET:  Diabetic diet.  FOLLOWUP:  He would follow up with Dr. Alysia Penna at the outpatient rehab service office as directed; Dr. Stevie Kern, medical management; Dr. Antony Contras, 1 month call for appointment.     Lauraine Rinne, P.A.     DA/MEDQ  D:  10/27/2015  T:  10/28/2015  Job:  [id number]  cc:   Dellis Filbert A. Sherren Mocha, MD Pramod P. Leonie Man, MD Charlett Blake, M.D.

## 2015-10-31 ENCOUNTER — Encounter: Payer: Medicare Other | Admitting: Occupational Therapy

## 2015-10-31 ENCOUNTER — Ambulatory Visit: Payer: Medicare Other | Admitting: Physical Therapy

## 2015-11-04 ENCOUNTER — Ambulatory Visit: Payer: Medicare Other | Admitting: Rehabilitation

## 2015-11-04 ENCOUNTER — Ambulatory Visit: Payer: Medicare Other | Admitting: Occupational Therapy

## 2015-11-06 ENCOUNTER — Ambulatory Visit (INDEPENDENT_AMBULATORY_CARE_PROVIDER_SITE_OTHER): Payer: Medicare Other | Admitting: Family Medicine

## 2015-11-06 VITALS — Temp 98.9°F | Wt 204.0 lb

## 2015-11-06 DIAGNOSIS — G8191 Hemiplegia, unspecified affecting right dominant side: Secondary | ICD-10-CM | POA: Diagnosis not present

## 2015-11-06 DIAGNOSIS — I10 Essential (primary) hypertension: Secondary | ICD-10-CM

## 2015-11-06 DIAGNOSIS — E139 Other specified diabetes mellitus without complications: Secondary | ICD-10-CM

## 2015-11-06 NOTE — Progress Notes (Signed)
   Subjective:    Patient ID: Darrell [name], male    DOB: 10-10-1946, 70 y.o.   MRN: [id number]  HPI [name] is a 70 year old married male nonsmoker who comes in today for follow-up having had a stroke on January 19. He was been in the hospital and discharged on the 31st. He presented with left-sided symptoms. He did not receive TPA. He states that most of his symptoms have resolved in the last couple weeks.  His blood pressure is 150/90. They cut his lisinopril from 20 mg a day to 10. [name] increased it to 20 mg a day 5 days ago.  They also increased his metformin 2000 mg twice a day prior that he been on 500 mg twice a day. Blood sugar did a home in the 1 07/27/2019 range.  They also put him on a medication for overactive bladder. He feels like he is urinating normally. Will taper off that. He also gave him baclofen 10 mg 3 times a day for spasticity. He's due to go back to neurology and outpatient rehabilitation soon.   Review of Systems Review of systems otherwise negative    Objective:   Physical Exam Well-developed well-nourished male no acute distress vital signs stable he is afebrile       Assessment & Plan:  Recent stroke left hemiplegia with dysarthria symptoms markedly improved over time......... follow-up by neurology and outpatient rehabilitation  Diabetes type 2........ continue metformin 1000 mg twice a day monitor fasting sugar daily follow-up in 3 weeks  Hypertension........ continue lisinopril 20 mg daily....Marland KitchenMarland Kitchen

## 2015-11-06 NOTE — Patient Instructions (Signed)
Metformin 1000 mg.....[name] one before breakfast and one before your evening meal........... fasting blood sugar daily in the morning  Lisinopril 20 mg..........[name] 1 daily in the morning........[name] blood pressure check daily in the morning  Return in 3 weeks with a record of all your blood sugar and blood pressure readings  In the meantime have a question or problem Rachel's extension is 2231  The medication for the over active bladder take 1 tablet Monday Wednesday Friday until I see you back in 3 weeks

## 2015-11-06 NOTE — Progress Notes (Signed)
Pre visit review using our clinic review tool, if applicable. No additional management support is needed unless otherwise documented below in the visit note. 

## 2015-11-11 ENCOUNTER — Ambulatory Visit: Payer: Medicare Other | Admitting: Rehabilitation

## 2015-11-11 ENCOUNTER — Encounter: Payer: Self-pay | Admitting: Occupational Therapy

## 2015-11-11 ENCOUNTER — Ambulatory Visit: Payer: Medicare Other | Attending: Physical Medicine & Rehabilitation | Admitting: Occupational Therapy

## 2015-11-11 ENCOUNTER — Encounter: Payer: Self-pay | Admitting: Rehabilitation

## 2015-11-11 DIAGNOSIS — R2689 Other abnormalities of gait and mobility: Secondary | ICD-10-CM | POA: Diagnosis not present

## 2015-11-11 DIAGNOSIS — R269 Unspecified abnormalities of gait and mobility: Secondary | ICD-10-CM | POA: Insufficient documentation

## 2015-11-11 DIAGNOSIS — R6889 Other general symptoms and signs: Secondary | ICD-10-CM | POA: Insufficient documentation

## 2015-11-11 DIAGNOSIS — G8111 Spastic hemiplegia affecting right dominant side: Secondary | ICD-10-CM

## 2015-11-11 DIAGNOSIS — F0789 Other personality and behavioral disorders due to known physiological condition: Secondary | ICD-10-CM | POA: Insufficient documentation

## 2015-11-11 DIAGNOSIS — F09 Unspecified mental disorder due to known physiological condition: Secondary | ICD-10-CM | POA: Insufficient documentation

## 2015-11-11 DIAGNOSIS — R531 Weakness: Secondary | ICD-10-CM

## 2015-11-11 DIAGNOSIS — I69359 Hemiplegia and hemiparesis following cerebral infarction affecting unspecified side: Secondary | ICD-10-CM | POA: Diagnosis not present

## 2015-11-11 DIAGNOSIS — R2681 Unsteadiness on feet: Secondary | ICD-10-CM | POA: Diagnosis not present

## 2015-11-11 NOTE — Therapy (Signed)
[facility] [address] [place], [place], [address] Phone: [phone]   Fax:  [phone]  Occupational Therapy Evaluation  Patient Details  Name: Darrell [name] MRN: [id number] Date of Birth: Feb 11, 1946 Referring Provider: Dr Letta Pate  Encounter Date: 11/11/2015      OT End of Session - 11/11/15 1638    Visit Number 1   Number of Visits 17   Date for OT Re-Evaluation 01/10/16   Authorization Type Medicare - g code and progress note visit 10   Authorization - Visit Number 1   Authorization - Number of Visits 10   OT Start Time 1450   OT Stop Time 1530   OT Time Calculation (min) 40 min   Activity Tolerance Patient tolerated treatment well   Behavior During Therapy [facility] for tasks assessed/performed      Past Medical History  Diagnosis Date  . Diabetes mellitus   . Hypertension     dr Sherren Mocha  . Sleep apnea     cpap      sleep study > 4 yrs; no longer wearing CPAP  . Stroke [facility])     rt side weakness, 2013  . Poorly controlled type 2 diabetes mellitus with peripheral neuropathy ([place]) 10/14/2015  . History of CVA with residual deficit 10/14/2015    Past Surgical History  Procedure Laterality Date  . Heel spur surgery    . [place] surgery    . Endarterectomy  12/09/2011    Procedure: ENDARTERECTOMY CAROTID;  Surgeon: Tinnie Gens, MD;  Location: [facility] OR;  Service: Vascular;  Laterality: Left;  Left Carotid endarterectomy with dacron patch angioplasty  . Carotid endarterectomy  12/09/11    Left  . Hernia repair      x2  . Ankle surgery    . Inguinal hernia repair Left 01/22/2014    Procedure: LEFT INGUINAL HERNIA REPAIR WITH MESH;  Surgeon: [name], MD;  Location: [facility];  Service: General;  Laterality: Left;  . Insertion of mesh Left 01/22/2014    Procedure: INSERTION OF MESH;  Surgeon: [name], MD;  Location: [facility];  Service: General;  Laterality: Left;  . Colonoscopy      There were no  vitals filed for this visit.  Visit Diagnosis:  Hemiparesis affecting nondominant side as late effect of cerebrovascular accident [facility]) - Plan: Ot plan of care cert/re-cert  Cognitive and neurobehavioral dysfunction - Plan: Ot plan of care cert/re-cert  Spastic hemiplegia affecting right dominant side ([place]) - Plan: Ot plan of care cert/re-cert  Generalized weakness - Plan: Ot plan of care cert/re-cert  Unsteadiness on feet - Plan: Ot plan of care cert/re-cert      Subjective Assessment - 11/11/15 1611    Patient is accompained by: --  wife [name]   Patient Stated Goals Greater independence with activities of daily living   Currently in Pain? No/denies   Pain Score 0-No pain           [facility] OT Assessment - 11/11/15 0001    Assessment   Diagnosis Acute nonhemorrhagic white matter infarct and progressive remote lacunar infarct R/L basal ganglia, small vessel disease   Referring Provider Dr Letta Pate   Onset Date 10/12/15   Assessment Discharged from cir 10/24/15   Prior Therapy CIR   Precautions   Precautions Fall   Precaution Comments prior stroke with spastic hemiplegia   Balance Screen   Has the patient fallen in the past 6 months No  Has the patient had a decrease in activity level because of a fear of falling?  Yes   Home  Environment   Bathroom Building control surveyor   Lives With Spouse   Prior Function   Level of Independence Needs assistance with ADLs   Level of Independence - Bath Minimal   Vocation Retired   Leisure reading newspaper   ADL   Eating/Feeding Supervision/safety   Grooming Supervision/safety   Upper Body Bathing Supervision/safety   Upper Body bathing details wife gives step by step instructions, and assists with applying soap to wash mitt   Where Assesed Upper Body Bathing Supported sitting   Lower Body Bathing Supervision/safety   Upper Body Dressing Moderate assistance   Lower Body Dressing Increased time   Tub/Shower Transfer Minimal  assistance   Archivist seat with back;Grab bars;Walk in shower   IADL   Light Housekeeping Performs light daily tasks such as dishwashing, bed making   Written Expression   Dominant Hand Right   Handwriting Not legible   Written Experience Unable to assess (comment)   Vision - History   Baseline Vision Wears glasses all the time   Visual History Glaucome   Vision Assessment   Eye Alignment Within Functional Limits   Activity Tolerance   Activity Tolerance Tolerates 10-20 min activity with muiltiple rests   Cognition   Overall Cognitive Status Impaired/Different from baseline   Area of Impairment Memory;Attention;Safety/judgement;Awareness   Current Attention Level Sustained   Safety/Judgement Decreased awareness of deficits   Awareness Emergent   Behaviors Poor frustration tolerance  flat affect   Observation/Other Assessments   Focus on Therapeutic Outcomes (FOTO)  FOTO Stroke IS Hand Function 15%   Outcome Measures Box and Blocks   Sensation   Light Touch Appears Intact   Stereognosis Not tested   Hot/Cold Not tested   Proprioception Appears Intact   Coordination   Gross Motor Movements are Fluid and Coordinated No   Fine Motor Movements are Fluid and Coordinated No   Coordination and Movement Description dysmetric movement left, spasticity dominates right UE movement   Box and Blocks 32 left, 7 right    Tone   Assessment Location Right Upper Extremity   ROM / Strength   AROM / PROM / Strength AROM;Strength   AROM   Overall AROM  Deficits   Overall AROM Comments Active shoulder flexion, abduction, externsl rotation limited to 30% right   Strength   Overall Strength Deficits   Hand Function   Right Hand Gross Grasp Impaired   Right Hand Grip (lbs) unable   Right Hand Lateral Pinch --  unable   Left Hand Gross Grasp Impaired   Left Hand Grip (lbs) 55   Left Hand Lateral Pinch 14 lbs   RUE Tone   RUE Tone Moderate   RUE Tone   Modified  Ashworth Scale for Grading Hypertonia RUE More marked increase in muscle tone through most of the ROM, but affected part(s) easily moved                         OT Education - 11/11/15 1637    Education provided Yes   Education Details Role of OT, and potentiial goals of OT   Person(s) Educated Patient;Spouse   Methods Explanation   Comprehension Verbalized understanding;Need further instruction          OT Short Term Goals - 11/11/15 1653    OT SHORT TERM GOAL #1  Title Patient will complete home activity program with min cueing   Time 4   Period Weeks   Status New   OT SHORT TERM GOAL #2   Title Patient will don front opening shirt with no more than set up assistance   Time 4   Period Weeks   Status New   OT SHORT TERM GOAL #3   Title Patient will step over lip of shower stall with no greater than supervision   Time 4   Period Weeks   Status New   OT SHORT TERM GOAL #4   Title Patient will complete all aspects of self bathing while in the shower with modified independence   Time 4   Period Weeks   Status New           OT Long Term Goals - 11/11/15 1654    OT LONG TERM GOAL #1   Title Patient will complete home activity / home exercise program independently   Time 8   Period Weeks   Status New   OT LONG TERM GOAL #2   Title Patient will shower with modifeid independence   Time 8   Period Weeks   Status New   OT LONG TERM GOAL #3   Title Patient will complete 15-20  minutes of ADL/IADL without a rest break, includes sitting, standing, and walking, e.g. getting dressed and making the bed   Time 8   Period Weeks   Status New   OT LONG TERM GOAL #4   Title Patient will dress himself with modifeid independence in nder 10 minutes - casula clothing   Baseline 8   Period Weeks   Status New   OT LONG TERM GOAL #5   Title Patient will demonstrate at least 3 lb increase in left pinch strength to ease independence with ADL   Baseline 14 lb    Time 8   Period Weeks               Plan - 11/11/15 1639    Clinical Impression Statement Patient presents as a 70 year old male with prior L MCA CVA (2013) and resultant right spastic hemiplegia, now with small vessel disease, and nonhemorrhagic white matter infarct and progressive lacumnar infarct R/L basal ganglia.  Patient with diabetes, peripheral neuropathy, and hypertension.  Patient recently discharged home  from [facility] 10/24/15.  Patient presents to OT eval with Right hemiplegia, and left sided weakness, decreased coordination,delayed cognitive processing, decreased initiation, decreased selective attention, and decreased activity tolerance which limit independence with basic self care tasks.  Patient will benefit from skilled OT intervention to improve functional use of right UE as a gross assist to left, improve general conditioning / activity tolerance, improve strength and condition of left side, improve attention, initiaiton, and overall response time within ADL tasks.       Pt will benefit from skilled therapeutic intervention in order to improve on the following deficits (Retired) Decreased activity tolerance;Decreased balance;Decreased cognition;Decreased coordination;Decreased endurance;Decreased safety awareness;Decreased range of motion;Decreased knowledge of use of DME;Decreased knowledge of precautions;Decreased strength;Impaired perceived functional ability;Impaired flexibility;Improper body mechanics;Impaired vision/preception;Impaired UE functional use;Impaired tone   Rehab Potential Good   OT Frequency 2x / week   OT Duration 8 weeks   OT Treatment/Interventions Self-care/ADL training;Electrical Stimulation;Ultrasound;Fluidtherapy;Therapeutic exercise;Neuromuscular education;Manual Therapy;Functional Mobility Training;DME and/or AE instruction;Energy conservation;Cognitive remediation/compensation;Visual/perceptual remediation/compensation;Patient/family  education;Balance training;Therapeutic exercises;Therapeutic activities   Plan begin HEP,  shower transfer - sit to stand, step over shower lip, UE range of  motion passive to active rightm, strengthening left   Consulted and Agree with Plan of Care Patient;Family member/caregiver   Family Member Consulted wife [name] - November 12, 2015 1701    Functional Assessment Tool Used skilled clinical observation   Functional Limitation Carrying, moving and handling objects   Carrying, Moving and Handling Objects Current Status [phone]) At least 60 percent but less than 80 percent impaired, limited or restricted   Carrying, Moving and Handling Objects Goal Status [id number]) At least 40 percent but less than 60 percent impaired, limited or restricted      Problem List Patient Active Problem List   Diagnosis Date Noted  . Diabetes mellitus type 2 in nonobese (HCC)   . Hypoalbuminemia due to protein-calorie malnutrition ([place]) 10/15/2015  . Poorly controlled type 2 diabetes mellitus with peripheral neuropathy ([place]) 10/14/2015  . History of CVA with residual deficit 10/14/2015  . Hemiparesis affecting dominant side as late effect of stroke ([place]) 10/14/2015  . Depression 10/14/2015  . Basal ganglia infarction ([place]) 10/14/2015  . Acute ischemic stroke ([place])   . Left-sided weakness 10/12/2015  . Abnormal CXR 10/12/2015  . BPH associated with nocturia 08/20/2015  . Aftercare following surgery of the circulatory system, [place] 02/13/2014  . Postop check 02/06/2014  . Left inguinal hernia 12/28/2013  . Rib fractures 10/13/2013  . Fall 10/13/2013  . Dysplastic nevus of trunk 02/14/2013  . Bicipital tendinitis of right shoulder 07/12/2012  . Subacromial bursitis, right 03/28/2012  . Right spastic hemiparesis ([place]) 02/02/2012  . Occlusion and stenosis of carotid artery with cerebral infarction 01/26/2012  . Hemiplegia affecting right dominant side ([place]) 12/24/2011  . CVA (cerebral infarction)  12/15/2011  . Occlusion and stenosis of carotid artery without mention of cerebral infarction 12/08/2011  . Carotid artery plaque 12/04/2011  . Left carotid bruit 11/30/2011  . AUTONOMIC NEUROPATHY, DIABETIC 11/25/2010  . Hyperlipidemia 03/15/2008  . Obstructive sleep apnea 07/27/2007  . Diabetes 1.5, managed as type 2 ([place]) 06/15/2007  . Essential hypertension 06/15/2007    Mariah Milling, OTR/L 11/12/2015, 5:05 PM  [facility] [address] [place], [place], [address] Phone: [phone]   Fax:  [phone]  Name: Darrell [name] MRN: [id number] Date of Birth: 04/06/1946

## 2015-11-11 NOTE — Therapy (Signed)
[facility] [address] [place], [place], [address] Phone: [phone]   Fax:  [phone]  Physical Therapy Evaluation  Patient Details  Name: Darrell [name] MRN: [id number] Date of Birth: 08-31-1946 Referring Provider: Alysia Penna, MD  Encounter Date: 11/11/2015      PT End of Session - 11/11/15 1907    Visit Number 1   Number of Visits 17   Date for PT Re-Evaluation 01/10/16   Authorization Type MCR primary-G codes due every 10th visit, [employer] secondary   PT Start Time 1535   PT Stop Time 1620   PT Time Calculation (min) 45 min   Equipment Utilized During Treatment Gait belt   Activity Tolerance Patient tolerated treatment well   Behavior During Therapy [facility] for tasks assessed/performed      Past Medical History  Diagnosis Date  . Diabetes mellitus   . Hypertension     dr Sherren Mocha  . Sleep apnea     cpap      sleep study > 4 yrs; no longer wearing CPAP  . Stroke [facility])     rt side weakness, 2013  . Poorly controlled type 2 diabetes mellitus with peripheral neuropathy ([place]) 10/14/2015  . History of CVA with residual deficit 10/14/2015    Past Surgical History  Procedure Laterality Date  . Heel spur surgery    . [place] surgery    . Endarterectomy  12/09/2011    Procedure: ENDARTERECTOMY CAROTID;  Surgeon: Tinnie Gens, MD;  Location: [facility] OR;  Service: Vascular;  Laterality: Left;  Left Carotid endarterectomy with dacron patch angioplasty  . Carotid endarterectomy  12/09/11    Left  . Hernia repair      x2  . Ankle surgery    . Inguinal hernia repair Left 01/22/2014    Procedure: LEFT INGUINAL HERNIA REPAIR WITH MESH;  Surgeon: [name], MD;  Location: [facility];  Service: General;  Laterality: Left;  . Insertion of mesh Left 01/22/2014    Procedure: INSERTION OF MESH;  Surgeon: [name], MD;  Location: [facility];  Service: General;  Laterality: Left;  . Colonoscopy      There were no vitals filed  for this visit.  Visit Diagnosis:  Abnormality of gait - Plan: PT plan of care cert/re-cert  Unsteadiness - Plan: PT plan of care cert/re-cert  Poor balance - Plan: PT plan of care cert/re-cert  Decreased activity tolerance - Plan: PT plan of care cert/re-cert  Generalized weakness - Plan: PT plan of care cert/re-cert  Right spastic hemiplegia ([place]) - Plan: PT plan of care cert/re-cert      Subjective Assessment - 11/11/15 1543    Subjective "I had strong encouragement to do this."     Limitations Walking;House hold activities   Patient Stated Goals "to improve my balance"    Currently in Pain? No/denies            [facility] PT Assessment - 11/11/15 1545    Assessment   Medical Diagnosis CVA   Referring Provider Alysia Penna, MD   Onset Date/Surgical Date 10/11/15   Hand Dominance Right   Prior Therapy Had therapy from previous CVA   Precautions   Precautions Fall   Precaution Comments old R spastic hemiparesis   Balance Screen   Has the patient fallen in the past 6 months No   Has the patient had a decrease in activity level because of a fear of falling?  No   Is  the patient reluctant to leave their home because of a fear of falling?  No   Home Environment   Living Environment Private residence   Living Arrangements Spouse/significant other   Available Help at Discharge Family;Available 24 hours/day   Type of Home House   Home Access Stairs to enter   Entrance Stairs-Number of Steps 1 to walkway and then 1 to stoop   Entrance Stairs-Rails Right;Left   Home Layout Two level   Alternate Level Stairs-Number of Steps 12   Alternate Level Stairs-Rails Left   Home Equipment Crutches  single forearm crutch in community (prior to 2nd CVA)   Additional Comments Only used single forearm crutch outside of home before, but is now uses inside and outside of home.    Prior Function   Level of Independence Independent with household mobility with device;Independent with  community mobility with device;Requires assistive device for independence;Needs assistance with homemaking  S for bathing, wife helps with household activities   Vocation Retired   Water quality scientist   Leisure walking outside with the cane (wants to get back to "where he was")   Cognition   Overall Cognitive Status Impaired/Different from baseline  wife reports he gets confused, some memory issues   Area of Impairment Attention;Memory;Safety/judgement;Awareness   Current Attention Level Sustained   Safety/Judgement Decreased awareness of deficits   Awareness Emergent   Observation/Other Assessments   Focus on Therapeutic Outcomes (FOTO)  [place] IS-mobility 33.3 %   Sensation   Light Touch Appears Intact   Hot/Cold Appears Intact  per pt report   Proprioception Appears Intact   ROM / Strength   AROM / PROM / Strength Strength   Strength   Overall Strength Deficits   Overall Strength Comments R 4/5 hip flex, L hip flex 5/5, R and L knee ext 5/5, R and L knee flex 5/5, R 4/5 and L 5/5 ankle DF    Transfers   Transfers Sit to Stand;Stand to Sit   Sit to Stand 4: Min assist   Stand to Sit 5: Supervision   Comments Pt with marked difficulty performing sit<>stand despite good strength in BLEs.    Ambulation/Gait   Ambulation/Gait Yes   Ambulation/Gait Assistance 5: Supervision   Ambulation/Gait Assistance Details S with L loft strand crutch, min/guard without AD   Ambulation Distance (Feet) 345 Feet  another 115'   Assistive device L Forearm Crutch;None   Gait Pattern Step-through pattern;Decreased arm swing - right;Decreased step length - right;Decreased step length - left;Decreased stance time - right;Decreased stride length;Decreased hip/knee flexion - right;Decreased dorsiflexion - right;Trunk flexed;Poor foot clearance - right  note intermittent R foot clearance deficits with fatigue   Ambulation Surface Level;Indoor   Gait velocity 2.00 ft/sec   Stairs Yes    Stairs Assistance 5: Supervision   Stair Management Technique One rail Left;Step to pattern;Forwards   Number of Stairs 4   Height of Stairs 6   Balance   Balance Assessed Yes   Standardized Balance Assessment   Standardized Balance Assessment Dynamic Gait Index   Dynamic Gait Index   Level Surface Mild Impairment   Change in Gait Speed Moderate Impairment   Gait with Horizontal Head Turns Mild Impairment   Gait with Vertical Head Turns Moderate Impairment   Gait and Pivot Turn Mild Impairment   Step Over Obstacle Mild Impairment   Step Around Obstacles Mild Impairment   Steps Moderate Impairment   Total Score 13   DGI comment: Scores of 19  or less are predictive of falls in older community living adults   RUE Tone   Modified Ashworth Scale for Grading Hypertonia RUE More marked increase in muscle tone through most of the ROM, but affected part(s) easily moved   RUE Tone Comments from old CVA                           PT Education - 11/11/15 1905    Education provided Yes   Education Details evaluation findings, POC, goals   Person(s) Educated Patient;Spouse   Methods Explanation   Comprehension Verbalized understanding;Need further instruction          PT Short Term Goals - 11/11/15 1639    PT SHORT TERM GOAL #1   Title Pt will initiate HEP for strengthening and balance in order to decrease balance and improve functional strength.  (Target Date: 12/09/15)   PT SHORT TERM GOAL #2   Title Pt will improve DGI score to 17/24 in order to indicate decreased fall risk and improved functional balance.     PT SHORT TERM GOAL #3   Title Pt will perform 8/10 sit<>stand with single UE support at mod I level in order to indicate improvement in functional strength.    PT SHORT TERM GOAL #4   Title Pt will increase gait speed to 2.60 ft/sec in order to indicate decreased fall risk and improved efficiency of gait.     PT SHORT TERM GOAL #5   Title Pt will ambulate  200' over indoor surfaces without AD in order to indicate safe home negotiation and progression to PLOF.     Additional Short Term Goals   Additional Short Term Goals Yes   PT SHORT TERM GOAL #6   Title Will assess 6MWT and improve distance by 150' in order to indicate improvement in functional endurance.             PT Long Term Goals - 11/11/15 1918    PT LONG TERM GOAL #1   Title Pt will be independent with HEP for BLE strengthening and balance to indicate decreased fall risk and improvement in functional mobility.  (Target Date: 01/06/16)   PT LONG TERM GOAL #2   Title Pt will perform 8/10 sit<>stand without UE support at mod I level to indicate improvement in functional strength.    PT LONG TERM GOAL #3   Title Pt will ambulate >1000' over varying community surfaces (including grass, curb, and ramp) at mod I level with use of L forearm crutch to indicate safe return to community at [facility].     PT LONG TERM GOAL #4   Title Pt will be able to ambulate around neighborhood "loop" (.6 mile) with L forearm crutch at mod I level to indicate improvement in functional endurance and return to leisure activity.     PT LONG TERM GOAL #5   Title Pt will increase DGI >19 points in order to indicate decreased fall risk.    Additional Long Term Goals   Additional Long Term Goals Yes   PT LONG TERM GOAL #6   Title Pt will increase SIS-mobility to 48.4% in order to indicate self reported improvement in mobility.                 Plan - 11/11/15 1908    Clinical Impression Statement Pt presents s/p acute hemorrhage in white matter and L lacunar and basal ganglia CVA with L hemi paresis.  Note pt with history of remote CVA with R spastic hemiparesis, HTN, DM w/ peripheral neuropathy, all that could impact pts therapy/rehab potential.  During evaluation note that pt with more generalized/functional weakness, decreased balance with DGI of 13/24 (indicative of high fall risk), gait speed of 2.00  ft/sec (indicative of limited community ambulator), gait abnormalities related to CVA, and decreased activity tolerance.  Pt with evolving condition and is of moderate complexity for rehab clinical decision making.     Pt will benefit from skilled therapeutic intervention in order to improve on the following deficits Abnormal gait;Decreased activity tolerance;Decreased balance;Decreased cognition;Decreased endurance;Decreased knowledge of precautions;Decreased scar mobility;Decreased strength;Impaired perceived functional ability;Impaired flexibility;Impaired tone;Impaired UE functional use;Improper body mechanics;Postural dysfunction   Rehab Potential Good   Clinical Impairments Affecting Rehab Potential co-morbidities, prior CVA   PT Frequency 2x / week   PT Duration 8 weeks   PT Treatment/Interventions ADLs/Self Care Home Management;Electrical Stimulation;DME Instruction;Gait training;Stair training;Functional mobility training;Therapeutic activities;Therapeutic exercise;Balance training;Neuromuscular re-education;Cognitive remediation;Patient/family education;Orthotic Fit/Training;Manual techniques;Vestibular   PT Next Visit Plan est HEP for BLE strengthening and balance (esp sit<>stand), assess 6MWT, work on gait without AD for indoor negotiation.    Consulted and Agree with Plan of Care Patient;Family member/caregiver   Family Member Consulted wife          G-Codes - 2015/11/14 1658    Functional Assessment Tool Used DGI: 13/24   Functional Limitation Mobility: Walking and moving around   Mobility: Walking and Moving Around Current Status [phone]) At least 40 percent but less than 60 percent impaired, limited or restricted   Mobility: Walking and Moving Around Goal Status [phone]) At least 1 percent but less than 20 percent impaired, limited or restricted       Problem List Patient Active Problem List   Diagnosis Date Noted  . Diabetes mellitus type 2 in nonobese (HCC)   .  Hypoalbuminemia due to protein-calorie malnutrition ([place]) 10/15/2015  . Poorly controlled type 2 diabetes mellitus with peripheral neuropathy ([place]) 10/14/2015  . History of CVA with residual deficit 10/14/2015  . Hemiparesis affecting dominant side as late effect of stroke ([place]) 10/14/2015  . Depression 10/14/2015  . Basal ganglia infarction ([place]) 10/14/2015  . Acute ischemic stroke ([place])   . Left-sided weakness 10/12/2015  . Abnormal CXR 10/12/2015  . BPH associated with nocturia 08/20/2015  . Aftercare following surgery of the circulatory system, [place] 02/13/2014  . Postop check 02/06/2014  . Left inguinal hernia 12/28/2013  . Rib fractures 10/13/2013  . Fall 10/13/2013  . Dysplastic nevus of trunk 02/14/2013  . Bicipital tendinitis of right shoulder 07/12/2012  . Subacromial bursitis, right 03/28/2012  . Right spastic hemiparesis ([place]) 02/02/2012  . Occlusion and stenosis of carotid artery with cerebral infarction 01/26/2012  . Hemiplegia affecting right dominant side ([place]) 12/24/2011  . CVA (cerebral infarction) 12/15/2011  . Occlusion and stenosis of carotid artery without mention of cerebral infarction 12/08/2011  . Carotid artery plaque 12/04/2011  . Left carotid bruit 11/30/2011  . AUTONOMIC NEUROPATHY, DIABETIC 11/25/2010  . Hyperlipidemia 03/15/2008  . Obstructive sleep apnea 07/27/2007  . Diabetes 1.5, managed as type 2 ([place]) 06/15/2007  . Essential hypertension 06/15/2007    Cameron Sprang, PT, MPT [facility] [address] [place], [place], [address] Phone: [phone]   Fax:  [phone] 11/14/15, 7:27 PM  Name: Darrell [name] MRN: [id number] Date of Birth: 1945/12/20

## 2015-11-12 ENCOUNTER — Ambulatory Visit: Payer: Medicare Other | Admitting: Physical Therapy

## 2015-11-12 ENCOUNTER — Encounter: Payer: Medicare Other | Admitting: Occupational Therapy

## 2015-11-14 ENCOUNTER — Encounter: Payer: Self-pay | Admitting: Physical Medicine & Rehabilitation

## 2015-11-18 ENCOUNTER — Encounter: Payer: Self-pay | Admitting: Physical Therapy

## 2015-11-18 ENCOUNTER — Ambulatory Visit: Payer: Medicare Other | Admitting: Occupational Therapy

## 2015-11-18 ENCOUNTER — Ambulatory Visit: Payer: Medicare Other | Admitting: Physical Therapy

## 2015-11-18 ENCOUNTER — Encounter: Payer: Self-pay | Admitting: Occupational Therapy

## 2015-11-18 DIAGNOSIS — I69359 Hemiplegia and hemiparesis following cerebral infarction affecting unspecified side: Secondary | ICD-10-CM | POA: Diagnosis not present

## 2015-11-18 DIAGNOSIS — G8111 Spastic hemiplegia affecting right dominant side: Secondary | ICD-10-CM

## 2015-11-18 DIAGNOSIS — R531 Weakness: Secondary | ICD-10-CM

## 2015-11-18 DIAGNOSIS — R6889 Other general symptoms and signs: Secondary | ICD-10-CM

## 2015-11-18 DIAGNOSIS — F0789 Other personality and behavioral disorders due to known physiological condition: Secondary | ICD-10-CM

## 2015-11-18 DIAGNOSIS — R2681 Unsteadiness on feet: Secondary | ICD-10-CM | POA: Diagnosis not present

## 2015-11-18 DIAGNOSIS — R269 Unspecified abnormalities of gait and mobility: Secondary | ICD-10-CM

## 2015-11-18 DIAGNOSIS — F09 Unspecified mental disorder due to known physiological condition: Secondary | ICD-10-CM | POA: Diagnosis not present

## 2015-11-18 DIAGNOSIS — R2689 Other abnormalities of gait and mobility: Secondary | ICD-10-CM

## 2015-11-18 NOTE — Therapy (Signed)
[facility] [address] [place], [place], [address] Phone: [phone]   Fax:  [phone]  Occupational Therapy Treatment  Patient Details  Name: Darrell [name] MRN: [id number] Date of Birth: Oct 12, 1946 Referring Provider: Dr Letta Pate  Encounter Date: 11/18/2015      OT End of Session - 11/18/15 1638    Visit Number 2   Number of Visits 17   Date for OT Re-Evaluation 01/10/16   Authorization Type Medicare - g code and progress note visit 10   Authorization - Visit Number 2   Authorization - Number of Visits 10   OT Start Time [id number]   OT Stop Time 1618   OT Time Calculation (min) 48 min      Past Medical History  Diagnosis Date  . Diabetes mellitus   . Hypertension     dr Sherren Mocha  . Sleep apnea     cpap      sleep study > 4 yrs; no longer wearing CPAP  . Stroke [facility])     rt side weakness, 2013  . Poorly controlled type 2 diabetes mellitus with peripheral neuropathy ([place]) 10/14/2015  . History of CVA with residual deficit 10/14/2015    Past Surgical History  Procedure Laterality Date  . Heel spur surgery    . [place] surgery    . Endarterectomy  12/09/2011    Procedure: ENDARTERECTOMY CAROTID;  Surgeon: Tinnie Gens, MD;  Location: [facility] OR;  Service: Vascular;  Laterality: Left;  Left Carotid endarterectomy with dacron patch angioplasty  . Carotid endarterectomy  12/09/11    Left  . Hernia repair      x2  . Ankle surgery    . Inguinal hernia repair Left 01/22/2014    Procedure: LEFT INGUINAL HERNIA REPAIR WITH MESH;  Surgeon: [name], MD;  Location: [facility];  Service: General;  Laterality: Left;  . Insertion of mesh Left 01/22/2014    Procedure: INSERTION OF MESH;  Surgeon: [name], MD;  Location: [facility];  Service: General;  Laterality: Left;  . Colonoscopy      There were no vitals filed for this visit.  Visit Diagnosis:  Decreased activity tolerance  Unsteadiness  Right spastic  hemiplegia (HCC)  Hemiparesis affecting nondominant side as late effect of cerebrovascular accident [facility])  Cognitive and neurobehavioral dysfunction      Subjective Assessment - 11/18/15 1529    Subjective  I am tired of being stuck all the time   Patient Stated Goals Greater independence with activities of daily living   Currently in Pain? No/denies                      OT Treatments/Exercises (OP) - 11/18/15 1626    ADLs   UB Dressing Patient able to don front opening shirt, with increased time in static standing.  Patient initially stood with legs braced against mat table for support  Patient unable to zip coat without assistance.  Patient makes attempts to bring right hand into activity briefly, yet has difficulty sustaining right hand in functional task.  Learned non use.       LB Dressing Patient reports he has needed assistance from hIs wife to don socks.  He reports feeling so fatigued after showering, and dressing that he cannot sustain activity.  In the clinic, patient able to don/doff socks using left hand technique.     Bathing Practiced shower stall transfers (simulated) Patient reports being able to  step in and out of tub with wife only providing set up assistance (opening shower door, turnin on water) and supervision   Neurological Re-education Exercises   Other Exercises 1 Patient has isolated control of right shoulder, elbow, forearm, wrist and digits through approximately 50% of normal range of motion.  Patient does not use right UE for any functional task.  Discussed wiyth patient that he has potential to have some functional sue of his right upper extremity with increased preparation, and emphasis on this extremity. Patient indicates that he would like to be able to use his riught arm for limited functional tasks.  Patient is open to the idea of wearing a splint to reduce tightness in right hand.                  OT Education - 11/18/15 1638     Education Details Reviewed all goals with patient, initiated stretching program for shoulders - not yet ready   Methods Explanation   Comprehension Verbalized understanding          OT Short Term Goals - 11/18/15 1641    OT SHORT TERM GOAL #1   Title Patient will complete home activity program with min cueing   Status On-going   OT SHORT TERM GOAL #2   Title Patient will don front opening shirt with no more than set up assistance   Status On-going   OT SHORT TERM GOAL #3   Title Patient will step over lip of shower stall with no greater than supervision   Status On-going   OT SHORT TERM GOAL #4   Title Patient will complete all aspects of self bathing while in the shower with modified independence   Status On-going           OT Long Term Goals - 11/18/15 1642    OT LONG TERM GOAL #1   Title Patient will complete home activity / home exercise program independently   Status On-going   OT LONG TERM GOAL #2   Title Patient will shower with modifeid independence   Status On-going   OT LONG TERM GOAL #3   Title Patient will complete 15-20  minutes of ADL/IADL without a rest break, includes sitting, standing, and walking, e.g. getting dressed and making the bed   Status On-going   OT LONG TERM GOAL #4   Title Patient will dress himself with modifeid independence in nder 10 minutes - casula clothing   Status On-going   OT LONG TERM GOAL #5   Title Patient will demonstrate at least 3 lb increase in left pinch strength to ease independence with ADL   Status On-going               Plan - 11/18/15 1640    Clinical Impression Statement Patient is in agreement with his OT goals, and is eager for greater independence.  Patient reporting improved independence with ADL since OT eval last week.     Pt will benefit from skilled therapeutic intervention in order to improve on the following deficits (Retired) Decreased activity tolerance;Decreased balance;Decreased  cognition;Decreased coordination;Decreased endurance;Decreased safety awareness;Decreased range of motion;Decreased knowledge of use of DME;Decreased knowledge of precautions;Decreased strength;Impaired perceived functional ability;Impaired flexibility;Improper body mechanics;Impaired vision/preception;Impaired UE functional use;Impaired tone   Rehab Potential Good   OT Frequency 2x / week   OT Duration 8 weeks   OT Treatment/Interventions Self-care/ADL training;Electrical Stimulation;Ultrasound;Fluidtherapy;Therapeutic exercise;Neuromuscular education;Manual Therapy;Functional Mobility Training;DME and/or AE instruction;Energy conservation;Cognitive remediation/compensation;Visual/perceptual remediation/compensation;Patient/family education;Balance training;Therapeutic exercises;Therapeutic activities  Plan HEP for shoulder stretching, consider resting hand splint right UE   Consulted and Agree with Plan of Care Patient        Problem List Patient Active Problem List   Diagnosis Date Noted  . Diabetes mellitus type 2 in nonobese (HCC)   . Hypoalbuminemia due to protein-calorie malnutrition ([place]) 10/15/2015  . Poorly controlled type 2 diabetes mellitus with peripheral neuropathy ([place]) 10/14/2015  . History of CVA with residual deficit 10/14/2015  . Hemiparesis affecting dominant side as late effect of stroke ([place]) 10/14/2015  . Depression 10/14/2015  . Basal ganglia infarction ([place]) 10/14/2015  . Acute ischemic stroke ([place])   . Left-sided weakness 10/12/2015  . Abnormal CXR 10/12/2015  . BPH associated with nocturia 08/20/2015  . Aftercare following surgery of the circulatory system, [place] 02/13/2014  . Postop check 02/06/2014  . Left inguinal hernia 12/28/2013  . Rib fractures 10/13/2013  . Fall 10/13/2013  . Dysplastic nevus of trunk 02/14/2013  . Bicipital tendinitis of right shoulder 07/12/2012  . Subacromial bursitis, right 03/28/2012  . Right spastic hemiparesis ([place])  02/02/2012  . Occlusion and stenosis of carotid artery with cerebral infarction 01/26/2012  . Hemiplegia affecting right dominant side ([place]) 12/24/2011  . CVA (cerebral infarction) 12/15/2011  . Occlusion and stenosis of carotid artery without mention of cerebral infarction 12/08/2011  . Carotid artery plaque 12/04/2011  . Left carotid bruit 11/30/2011  . AUTONOMIC NEUROPATHY, DIABETIC 11/25/2010  . Hyperlipidemia 03/15/2008  . Obstructive sleep apnea 07/27/2007  . Diabetes 1.5, managed as type 2 ([place]) 06/15/2007  . Essential hypertension 06/15/2007    Mariah Milling, OTR/L 11/18/2015, 4:44 PM  [facility] [address] [place], [place], [address] Phone: [phone]   Fax:  [phone]  Name: Jdyn Resendiz MRN: [id number] Date of Birth: January 12, 1946

## 2015-11-18 NOTE — Patient Instructions (Signed)
Functional Quadriceps: Sit to Stand    Sit on edge of chair, feet flat on floor. Stand upright, extending knees fully. Repeat __10__ times per set. Do _1-2__ sets per session. Do __1-2__ sessions per day.  http://orth.exer.us/734    Copyright  VHI. All rights reserved.  Hip Abduction (Standing)    Stand with support on counter top. Lift right leg out to side, keeping toe forward. Hold for _3__ seconds and then slowly bring leg back to floor. Repeat with left leg, 3 sec hold.  Repeat _10__ times each leg. Do _1-2__ times a day.  Copyright  VHI. All rights reserved.  EXTENSION: Standing (Active)    At counter top: Stand, both feet flat. Bring right leg backwards, keeping knee as straight as possible. Do not lean forward. Repeat with left leg.  . Complete 10 reps each leg. 1-2 times a day.  http://gtsc.exer.us/76   Copyright  VHI. All rights reserved.  Toe / Heel Raise (Standing)    Standing with support, raise heels, then rock back on heels and raise toes. Repeat __10__ times. 1-2 times a day.  Copyright  VHI. All rights reserved.     "I love a Database administrator   At counter for balance as needed: high knee marching forward and then backward. 3 second pauses with each knee lift.  Repeat 3 laps each way. Do _1-2_ sessions per day. http://gt2.exer.us/345   Copyright  VHI. All rights reserved.  Side-Stepping   At counter for balance as needed: Walk to left side with eyes open. Take even steps, leading with same foot. Make sure each foot lifts off the floor. Repeat in opposite direction. Keep feet pointed toward counter. Repeat for 3 laps each way.  Do _1-2__ sessions per day.  Copyright  VHI. All rights reserved.  Walking on Heels   At counter: Walk on heels forward while continuing on a straight path, and then walk on heels backward to starting position. Repeat for 3 laps each way. Do _1-2__ sessions per day.   Copyright  VHI. All rights reserved.  Feet  Heel-Toe "Tandem"   At counter: Arms at sides, walk a straight line forward bringing one foot directly in front of the other, and then a straight line backwards bringing one foot directly behind the other one.  Repeat for _3 laps each way. Do _1-2_ sessions per day.

## 2015-11-18 NOTE — Therapy (Signed)
[facility] [address] [place], [place], [address] Phone: [phone]   Fax:  [phone]  Physical Therapy Treatment  Patient Details  Name: Darrell [name] MRN: [id number] Date of Birth: 1946/05/25 Referring Provider: Alysia Penna, MD  Encounter Date: 11/18/2015      PT End of Session - 11/18/15 1453    Visit Number 2   Number of Visits 17   Date for PT Re-Evaluation 01/10/16   Authorization Type MCR primary-G codes due every 10th visit, [employer] secondary   PT Start Time 1447   PT Stop Time 1529   PT Time Calculation (min) 42 min   Equipment Utilized During Treatment Gait belt   Activity Tolerance Patient tolerated treatment well   Behavior During Therapy [facility] for tasks assessed/performed      Past Medical History  Diagnosis Date  . Diabetes mellitus   . Hypertension     dr Sherren Mocha  . Sleep apnea     cpap      sleep study > 4 yrs; no longer wearing CPAP  . Stroke [facility])     rt side weakness, 2013  . Poorly controlled type 2 diabetes mellitus with peripheral neuropathy ([place]) 10/14/2015  . History of CVA with residual deficit 10/14/2015    Past Surgical History  Procedure Laterality Date  . Heel spur surgery    . [place] surgery    . Endarterectomy  12/09/2011    Procedure: ENDARTERECTOMY CAROTID;  Surgeon: Tinnie Gens, MD;  Location: [facility] OR;  Service: Vascular;  Laterality: Left;  Left Carotid endarterectomy with dacron patch angioplasty  . Carotid endarterectomy  12/09/11    Left  . Hernia repair      x2  . Ankle surgery    . Inguinal hernia repair Left 01/22/2014    Procedure: LEFT INGUINAL HERNIA REPAIR WITH MESH;  Surgeon: [name], MD;  Location: [facility];  Service: General;  Laterality: Left;  . Insertion of mesh Left 01/22/2014    Procedure: INSERTION OF MESH;  Surgeon: [name], MD;  Location: [facility];  Service: General;  Laterality: Left;  . Colonoscopy      There were no vitals filed  for this visit.  Visit Diagnosis:  Abnormality of gait  Poor balance  Decreased activity tolerance  Generalized weakness  Unsteadiness  Unsteadiness on feet  Right spastic hemiplegia (HCC)      Subjective Assessment - 11/18/15 1452    Subjective No new compaints. No falls or pain to report.  Not currently doing any ex's at home. Also reports his right foot is dropping/catching today.    Limitations Walking;House hold activities   Patient Stated Goals "to improve my balance"    Currently in Pain? No/denies   Pain Score 0-No pain            [facility] PT Assessment - 11/18/15 1527    6 Minute Walk- Baseline   Modified Borg Scale for Dyspnea 0- Nothing at all   Perceived Rate of Exertion (Borg) 6-   6 Minute walk- Post Test   6 Minute Walk Post Test yes   HR (bpm) 102   02 Sat (%RA) 91 %   Modified Borg Scale for Dyspnea 1- Very mild shortness of breath   Perceived Rate of Exertion (Borg) 11- Fairly light   6 minute walk test results    Aerobic Endurance Distance Walked 660   Endurance additional comments min guard assist for balance with cues on  right foot clearance with gait.            [place] Adult PT Treatment/Exercise - 11/18/15 1529    Transfers   Sit to Stand 4: Min guard;From elevated surface;With upper extremity assist;From bed   Stand to Sit 5: Supervision;To elevated surface;With upper extremity assist;To bed   Ambulation/Gait   Ambulation/Gait No   Ambulation/Gait Assistance 5: Supervision;4: Min guard   Ambulation/Gait Assistance Details increased assist needed as gait progressed.    Ambulation Distance (Feet) 700 Feet  total (includes 6 minute walk distance), + around gym for ex   Assistive device L Forearm Crutch;None   Gait Pattern Step-through pattern;Decreased arm swing - right;Decreased step length - right;Decreased step length - left;Decreased stance time - right;Decreased stride length;Decreased hip/knee flexion - right;Decreased dorsiflexion -  right;Trunk flexed;Poor foot clearance - right   Ambulation Surface Level;Indoor     Exercises/Neuro Re-ed: Pt educated on and issued HEP for balance and strengthening. Refer to pt instructions for full details.          PT Education - 11/18/15 1518    Education provided Yes   Education Details HEP: exerecises for strengthening and balance   Person(s) Educated Patient   Methods Explanation;Demonstration;Handout   Comprehension Verbalized understanding;Returned demonstration;Need further instruction          PT Short Term Goals - 11/11/15 1639    PT SHORT TERM GOAL #1   Title Pt will initiate HEP for strengthening and balance in order to decrease balance and improve functional strength.  (Target Date: 12/09/15)   PT SHORT TERM GOAL #2   Title Pt will improve DGI score to 17/24 in order to indicate decreased fall risk and improved functional balance.     PT SHORT TERM GOAL #3   Title Pt will perform 8/10 sit<>stand with single UE support at mod I level in order to indicate improvement in functional strength.    PT SHORT TERM GOAL #4   Title Pt will increase gait speed to 2.60 ft/sec in order to indicate decreased fall risk and improved efficiency of gait.     PT SHORT TERM GOAL #5   Title Pt will ambulate 200' over indoor surfaces without AD in order to indicate safe home negotiation and progression to PLOF.     Additional Short Term Goals   Additional Short Term Goals Yes   PT SHORT TERM GOAL #6   Title Will assess 6MWT and improve distance by 150' in order to indicate improvement in functional endurance.             PT Long Term Goals - 11/11/15 1918    PT LONG TERM GOAL #1   Title Pt will be independent with HEP for BLE strengthening and balance to indicate decreased fall risk and improvement in functional mobility.  (Target Date: 01/06/16)   PT LONG TERM GOAL #2   Title Pt will perform 8/10 sit<>stand without UE support at mod I level to indicate improvement in  functional strength.    PT LONG TERM GOAL #3   Title Pt will ambulate >1000' over varying community surfaces (including grass, curb, and ramp) at mod I level with use of L forearm crutch to indicate safe return to community at [facility].     PT LONG TERM GOAL #4   Title Pt will be able to ambulate around neighborhood "loop" (.6 mile) with L forearm crutch at mod I level to indicate improvement in functional endurance and return to leisure activity.  PT LONG TERM GOAL #5   Title Pt will increase DGI >19 points in order to indicate decreased fall risk.    Additional Long Term Goals   Additional Long Term Goals Yes   PT LONG TERM GOAL #6   Title Pt will increase SIS-mobility to 48.4% in order to indicate self reported improvement in mobility.             Plan - 11/18/15 1454    Clinical Impression Statement Skilled session focued on establishment of HEP for strengthening and balance. Pt able to perform all exercises issued in session without any issues reported. 6 minute walk test also completed to establish baseline today.   Pt will benefit from skilled therapeutic intervention in order to improve on the following deficits Abnormal gait;Decreased activity tolerance;Decreased balance;Decreased cognition;Decreased endurance;Decreased knowledge of precautions;Decreased scar mobility;Decreased strength;Impaired perceived functional ability;Impaired flexibility;Impaired tone;Impaired UE functional use;Improper body mechanics;Postural dysfunction   Rehab Potential Good   Clinical Impairments Affecting Rehab Potential co-morbidities, prior CVA   PT Frequency 2x / week   PT Duration 8 weeks   PT Treatment/Interventions ADLs/Self Care Home Management;Electrical Stimulation;DME Instruction;Gait training;Stair training;Functional mobility training;Therapeutic activities;Therapeutic exercise;Balance training;Neuromuscular re-education;Cognitive remediation;Patient/family education;Orthotic  Fit/Training;Manual techniques;Vestibular   PT Next Visit Plan  work on gait without AD for indoor negotiation, balance and strengthening    Consulted and Agree with Plan of Care Patient;Family member/caregiver   Family Member Consulted wife        Problem List Patient Active Problem List   Diagnosis Date Noted  . Diabetes mellitus type 2 in nonobese (HCC)   . Hypoalbuminemia due to protein-calorie malnutrition ([place]) 10/15/2015  . Poorly controlled type 2 diabetes mellitus with peripheral neuropathy ([place]) 10/14/2015  . History of CVA with residual deficit 10/14/2015  . Hemiparesis affecting dominant side as late effect of stroke ([place]) 10/14/2015  . Depression 10/14/2015  . Basal ganglia infarction ([place]) 10/14/2015  . Acute ischemic stroke ([place])   . Left-sided weakness 10/12/2015  . Abnormal CXR 10/12/2015  . BPH associated with nocturia 08/20/2015  . Aftercare following surgery of the circulatory system, [place] 02/13/2014  . Postop check 02/06/2014  . Left inguinal hernia 12/28/2013  . Rib fractures 10/13/2013  . Fall 10/13/2013  . Dysplastic nevus of trunk 02/14/2013  . Bicipital tendinitis of right shoulder 07/12/2012  . Subacromial bursitis, right 03/28/2012  . Right spastic hemiparesis ([place]) 02/02/2012  . Occlusion and stenosis of carotid artery with cerebral infarction 01/26/2012  . Hemiplegia affecting right dominant side ([place]) 12/24/2011  . CVA (cerebral infarction) 12/15/2011  . Occlusion and stenosis of carotid artery without mention of cerebral infarction 12/08/2011  . Carotid artery plaque 12/04/2011  . Left carotid bruit 11/30/2011  . AUTONOMIC NEUROPATHY, DIABETIC 11/25/2010  . Hyperlipidemia 03/15/2008  . Obstructive sleep apnea 07/27/2007  . Diabetes 1.5, managed as type 2 ([place]) 06/15/2007  . Essential hypertension 06/15/2007    Willow Ora 11/18/2015, 3:54 PM  Willow Ora, PTA, [facility] [address], [place], [place]  [phone] 11/18/2015, 3:55 PM   Name: Darrell [name] MRN: [id number] Date of Birth: 1946/04/03

## 2015-11-20 ENCOUNTER — Ambulatory Visit: Payer: Medicare Other | Admitting: Physical Therapy

## 2015-11-20 ENCOUNTER — Encounter: Payer: Self-pay | Admitting: Physical Therapy

## 2015-11-20 ENCOUNTER — Ambulatory Visit: Payer: Medicare Other | Admitting: Occupational Therapy

## 2015-11-20 DIAGNOSIS — R269 Unspecified abnormalities of gait and mobility: Secondary | ICD-10-CM

## 2015-11-20 DIAGNOSIS — G8111 Spastic hemiplegia affecting right dominant side: Secondary | ICD-10-CM | POA: Diagnosis not present

## 2015-11-20 DIAGNOSIS — R2681 Unsteadiness on feet: Secondary | ICD-10-CM

## 2015-11-20 DIAGNOSIS — I69359 Hemiplegia and hemiparesis following cerebral infarction affecting unspecified side: Secondary | ICD-10-CM

## 2015-11-20 DIAGNOSIS — F09 Unspecified mental disorder due to known physiological condition: Secondary | ICD-10-CM | POA: Diagnosis not present

## 2015-11-20 DIAGNOSIS — R2689 Other abnormalities of gait and mobility: Secondary | ICD-10-CM

## 2015-11-20 DIAGNOSIS — R531 Weakness: Secondary | ICD-10-CM

## 2015-11-20 DIAGNOSIS — R6889 Other general symptoms and signs: Secondary | ICD-10-CM

## 2015-11-20 DIAGNOSIS — F0789 Other personality and behavioral disorders due to known physiological condition: Secondary | ICD-10-CM

## 2015-11-20 NOTE — Patient Instructions (Signed)
  WAND PRESS  Start by lying on your back and holding a wand or cane so that your elbows are rested by your side.   Next, slowly push the wand upwards towards the ceiling so that your elbows become fully straightened.  15 repetitions, 2 sets, 2 times/ day

## 2015-11-20 NOTE — Therapy (Signed)
[facility] [address] [place], [place], [address] Phone: [phone]   Fax:  [phone]  Physical Therapy Treatment  Patient Details  Name: Darrell [name] MRN: [id number] Date of Birth: 20-Jul-1946 Referring Provider: Alysia Penna, MD  Encounter Date: 11/20/2015   11/20/15 1454  PT Visits / Re-Eval  Visit Number 3  Number of Visits 17  Date for PT Re-Evaluation 01/10/16  Authorization  Authorization Type MCR primary-G codes due every 10th visit, [employer] secondary  PT Time Calculation  PT Start Time 1450  PT Stop Time 1530  PT Time Calculation (min) 40 min  PT - End of Session  Equipment Utilized During Treatment Gait belt  Activity Tolerance Patient tolerated treatment well  Behavior During Therapy [facility] for tasks assessed/performed     Past Medical History  Diagnosis Date  . Diabetes mellitus   . Hypertension     dr Sherren Mocha  . Sleep apnea     cpap      sleep study > 4 yrs; no longer wearing CPAP  . Stroke [facility])     rt side weakness, 2013  . Poorly controlled type 2 diabetes mellitus with peripheral neuropathy ([place]) 10/14/2015  . History of CVA with residual deficit 10/14/2015    Past Surgical History  Procedure Laterality Date  . Heel spur surgery    . [place] surgery    . Endarterectomy  12/09/2011    Procedure: ENDARTERECTOMY CAROTID;  Surgeon: Tinnie Gens, MD;  Location: [facility] OR;  Service: Vascular;  Laterality: Left;  Left Carotid endarterectomy with dacron patch angioplasty  . Carotid endarterectomy  12/09/11    Left  . Hernia repair      x2  . Ankle surgery    . Inguinal hernia repair Left 01/22/2014    Procedure: LEFT INGUINAL HERNIA REPAIR WITH MESH;  Surgeon: [name], MD;  Location: [facility];  Service: General;  Laterality: Left;  . Insertion of mesh Left 01/22/2014    Procedure: INSERTION OF MESH;  Surgeon: [name], MD;  Location: [facility];  Service: General;  Laterality: Left;  .  Colonoscopy      There were no vitals filed for this visit.  Visit Diagnosis:   Decreased activity tolerance    Unsteadiness     Abnormality of gait   .  Poor balance     Generalized weakness   .  Unsteadiness on feet        Subjective Assessment - 11/20/15 1453    Subjective No new complaints. No falls or pain to report. HEP is going okay with no issues to report.    Limitations Walking;House hold activities   Patient Stated Goals "to improve my balance"    Currently in Pain? No/denies   Pain Score 0-No pain            [facility] Adult PT Treatment/Exercise - 11/20/15 0001    Ambulation/Gait   Ambulation/Gait Yes   Ambulation/Gait Assistance 4: Min guard   Ambulation/Gait Assistance Details cues for increased right foot clearance, for increase step lenght/stride lenght   Ambulation Distance (Feet) 345 Feet  x1   Assistive device None   Gait Pattern Step-through pattern;Decreased arm swing - right;Decreased step length - right;Decreased step length - left;Decreased stance time - right;Decreased stride length;Decreased hip/knee flexion - right;Decreased dorsiflexion - right;Trunk flexed;Poor foot clearance - right   Ambulation Surface Indoor;Level   High Level Balance   High Level Balance Activities Side stepping;Marching forwards;Marching backwards;Tandem  walking  tandem fwd/bwd, heel walk fwd/bwd   High Level Balance Comments on red mats at counter top: 3 laps each/each way with min to mod assist for balance (increased assist with backwards direction). cues on posture, base of support and weight shifting.                      Balance Exercises - 11/20/15 1502    Balance Exercises: Standing   SLS with Vectors Solid surface;Other reps (comment);Limitations   Other Standing Exercises sit<>stands: with feet on airex x 10 reps, with feet across blue foam beam x 10 reps. cues on sequencing, weight shifting and foot position with activity.                                       Balance Exercises: Standing   SLS with Vectors Limitations with 6 inch box: alternating fwd and cross toe taps x 10 reps each bil legs;           PT Short Term Goals - 11/11/15 1639    PT SHORT TERM GOAL #1   Title Pt will initiate HEP for strengthening and balance in order to decrease balance and improve functional strength.  (Target Date: 12/09/15)   PT SHORT TERM GOAL #2   Title Pt will improve DGI score to 17/24 in order to indicate decreased fall risk and improved functional balance.     PT SHORT TERM GOAL #3   Title Pt will perform 8/10 sit<>stand with single UE support at mod I level in order to indicate improvement in functional strength.    PT SHORT TERM GOAL #4   Title Pt will increase gait speed to 2.60 ft/sec in order to indicate decreased fall risk and improved efficiency of gait.     PT SHORT TERM GOAL #5   Title Pt will ambulate 200' over indoor surfaces without AD in order to indicate safe home negotiation and progression to PLOF.     Additional Short Term Goals   Additional Short Term Goals Yes   PT SHORT TERM GOAL #6   Title Will assess 6MWT and improve distance by 150' in order to indicate improvement in functional endurance.             PT Long Term Goals - 11/11/15 1918    PT LONG TERM GOAL #1   Title Pt will be independent with HEP for BLE strengthening and balance to indicate decreased fall risk and improvement in functional mobility.  (Target Date: 01/06/16)   PT LONG TERM GOAL #2   Title Pt will perform 8/10 sit<>stand without UE support at mod I level to indicate improvement in functional strength.    PT LONG TERM GOAL #3   Title Pt will ambulate >1000' over varying community surfaces (including grass, curb, and ramp) at mod I level with use of L forearm crutch to indicate safe return to community at [facility].     PT LONG TERM GOAL #4   Title Pt will be able to ambulate around neighborhood "loop" (.6 mile) with L forearm crutch at mod I level to indicate  improvement in functional endurance and return to leisure activity.     PT LONG TERM GOAL #5   Title Pt will increase DGI >19 points in order to indicate decreased fall risk.    Additional Long Term Goals   Additional Long Term Goals  Yes   PT LONG TERM GOAL #6   Title Pt will increase SIS-mobility to 48.4% in order to indicate self reported improvement in mobility.          11/20/15 1454  Plan  Clinical Impression Statement Skilled session focused on gait without AD and on balance reactions. Pt is making steady progress on gait without AD, Continues to need assist with higher level balance activities. Pt making steady progress toward goals.   Pt will benefit from skilled therapeutic intervention in order to improve on the following deficits Abnormal gait;Decreased activity tolerance;Decreased balance;Decreased cognition;Decreased endurance;Decreased knowledge of precautions;Decreased scar mobility;Decreased strength;Impaired perceived functional ability;Impaired flexibility;Impaired tone;Impaired UE functional use;Improper body mechanics;Postural dysfunction  Rehab Potential Good  Clinical Impairments Affecting Rehab Potential co-morbidities, prior CVA  PT Frequency 2x / week  PT Duration 8 weeks  PT Treatment/Interventions ADLs/Self Care Home Management;Electrical Stimulation;DME Instruction;Gait training;Stair training;Functional mobility training;Therapeutic activities;Therapeutic exercise;Balance training;Neuromuscular re-education;Cognitive remediation;Patient/family education;Orthotic Fit/Training;Manual techniques;Vestibular  PT Next Visit Plan work on gait without AD for indoor negotiation, balance and strengthening   Consulted and Agree with Plan of Care Patient;Family member/caregiver  Family Member Consulted wife     Problem List Patient Active Problem List   Diagnosis Date Noted  . Diabetes mellitus type 2 in nonobese (HCC)   . Hypoalbuminemia due to protein-calorie  malnutrition ([place]) 10/15/2015  . Poorly controlled type 2 diabetes mellitus with peripheral neuropathy ([place]) 10/14/2015  . History of CVA with residual deficit 10/14/2015  . Hemiparesis affecting dominant side as late effect of stroke ([place]) 10/14/2015  . Depression 10/14/2015  . Basal ganglia infarction ([place]) 10/14/2015  . Acute ischemic stroke ([place])   . Left-sided weakness 10/12/2015  . Abnormal CXR 10/12/2015  . BPH associated with nocturia 08/20/2015  . Aftercare following surgery of the circulatory system, [place] 02/13/2014  . Postop check 02/06/2014  . Left inguinal hernia 12/28/2013  . Rib fractures 10/13/2013  . Fall 10/13/2013  . Dysplastic nevus of trunk 02/14/2013  . Bicipital tendinitis of right shoulder 07/12/2012  . Subacromial bursitis, right 03/28/2012  . Right spastic hemiparesis ([place]) 02/02/2012  . Occlusion and stenosis of carotid artery with cerebral infarction 01/26/2012  . Hemiplegia affecting right dominant side ([place]) 12/24/2011  . CVA (cerebral infarction) 12/15/2011  . Occlusion and stenosis of carotid artery without mention of cerebral infarction 12/08/2011  . Carotid artery plaque 12/04/2011  . Left carotid bruit 11/30/2011  . AUTONOMIC NEUROPATHY, DIABETIC 11/25/2010  . Hyperlipidemia 03/15/2008  . Obstructive sleep apnea 07/27/2007  . Diabetes 1.5, managed as type 2 ([place]) 06/15/2007  . Essential hypertension 06/15/2007    Willow Ora 11/20/2015, 3:28 PM  Willow Ora, PTA, [facility] [address], [place], [place] [address] [phone] 11/21/2015, 10:02 PM  Name: Darrell [name] MRN: [id number] Date of Birth: June 03, 1946    \]

## 2015-11-21 ENCOUNTER — Encounter: Payer: Self-pay | Admitting: Occupational Therapy

## 2015-11-21 NOTE — Therapy (Signed)
[facility] [address] [place], [place], [address] Phone: [phone]   Fax:  [phone]  Occupational Therapy Treatment  Patient Details  Name: Darrell [name] MRN: [id number] Date of Birth: [birth year] Referring Provider: Dr Letta Pate  Encounter Date: 11/20/2015      OT End of Session - 11/21/15 2005    Visit Number 3   Number of Visits 17   Date for OT Re-Evaluation 01/10/16   Authorization Type Medicare - g code and progress note visit 10   Authorization - Visit Number 3   Authorization - Number of Visits 10   OT Start Time [id number]   OT Stop Time 1615   OT Time Calculation (min) 45 min      Past Medical History  Diagnosis Date  . Diabetes mellitus   . Hypertension     dr Sherren Mocha  . Sleep apnea     cpap      sleep study > 4 yrs; no longer wearing CPAP  . Stroke [facility])     rt side weakness, 2013  . Poorly controlled type 2 diabetes mellitus with peripheral neuropathy ([place]) 10/14/2015  . History of CVA with residual deficit 10/14/2015    Past Surgical History  Procedure Laterality Date  . Heel spur surgery    . [place] surgery    . Endarterectomy  12/09/2011    Procedure: ENDARTERECTOMY CAROTID;  Surgeon: Tinnie Gens, MD;  Location: [facility] OR;  Service: Vascular;  Laterality: Left;  Left Carotid endarterectomy with dacron patch angioplasty  . Carotid endarterectomy  12/09/11    Left  . Hernia repair      x2  . Ankle surgery    . Inguinal hernia repair Left 01/22/2014    Procedure: LEFT INGUINAL HERNIA REPAIR WITH MESH;  Surgeon: [name], MD;  Location: [place];  Service: General;  Laterality: Left;  . Insertion of mesh Left 01/22/2014    Procedure: INSERTION OF MESH;  Surgeon: [name], MD;  Location: [place];  Service: General;  Laterality: Left;  . Colonoscopy      There were no vitals filed for this visit.  Visit Diagnosis:  Unsteadiness  Right spastic hemiplegia ([place])  Hemiparesis affecting  nondominant side as late effect of cerebrovascular accident [facility])  Cognitive and neurobehavioral dysfunction      Subjective Assessment - 11/21/15 2000    Subjective  I think the brace really helped my hand feel better.   Patient Stated Goals Greater independence with activities of daily living   Currently in Pain? No/denies   Pain Score 0-No pain                      OT Treatments/Exercises (OP) - 11/21/15 0001    ADLs   UB Dressing Patient able to don front opening jacket and zipper with min assist while standing without support.    Neurological Re-education Exercises   Other Exercises 1 Worked with patient, wife not present, to develop very simple exercise for bilateral upper extremity.  Patient with significant memory and attention deficit, and had best respones with modeling, and multiple repetition of a chest press exercise.     Other Exercises 2 In sitting, patient able to complete resistive hand exerciser for 5 minutes.  Patient able to pick up and release 1 inch blocks with right hand, and moderate cueing for hand position, grading, use of vision, etc.  Patient with decreased stiffness noted in digits, specifically  4th and 5th PIP joint todya after reporting 2 nights of wearing resting hand splint obtained years ago.               Balance Exercises - 11/20/15 1502    Balance Exercises: Standing   SLS with Vectors Solid surface;Other reps (comment);Limitations   Other Standing Exercises sit<>stands: with feet on airex x 10 reps, with feet across blue foam beam x 10 reps. cues on sequencing, weight shifting and foot position with activity.                                      Balance Exercises: Standing   SLS with Vectors Limitations with 6 inch box: alternating fwd and cross toe taps x 10 reps each bil legs;           OT Education - 11/21/15 2005    Education provided Yes   Education Details supine shoulder - bench press   Person(s) Educated Patient    Methods Explanation;Demonstration;Tactile cues;Verbal cues;Handout   Comprehension Need further instruction;Verbal cues required          OT Short Term Goals - 11/18/15 1641    OT SHORT TERM GOAL #1   Title Patient will complete home activity program with min cueing   Status On-going   OT SHORT TERM GOAL #2   Title Patient will don front opening shirt with no more than set up assistance   Status On-going   OT SHORT TERM GOAL #3   Title Patient will step over lip of shower stall with no greater than supervision   Status On-going   OT SHORT TERM GOAL #4   Title Patient will complete all aspects of self bathing while in the shower with modified independence   Status On-going           OT Long Term Goals - 11/18/15 1642    OT LONG TERM GOAL #1   Title Patient will complete home activity / home exercise program independently   Status On-going   OT LONG TERM GOAL #2   Title Patient will shower with modifeid independence   Status On-going   OT LONG TERM GOAL #3   Title Patient will complete 15-20  minutes of ADL/IADL without a rest break, includes sitting, standing, and walking, e.g. getting dressed and making the bed   Status On-going   OT LONG TERM GOAL #4   Title Patient will dress himself with modifeid independence in nder 10 minutes - casula clothing   Status On-going   OT LONG TERM GOAL #5   Title Patient will demonstrate at least 3 lb increase in left pinch strength to ease independence with ADL   Status On-going               Plan - 11/21/15 2006    Clinical Impression Statement Patient is showing imporvement in left hand strength, and improved flexibility and balance with simple transitional movements.    Pt will benefit from skilled therapeutic intervention in order to improve on the following deficits (Retired) Decreased activity tolerance;Decreased balance;Decreased cognition;Decreased coordination;Decreased endurance;Decreased safety awareness;Decreased  range of motion;Decreased knowledge of use of DME;Decreased knowledge of precautions;Decreased strength;Impaired perceived functional ability;Impaired flexibility;Improper body mechanics;Impaired vision/preception;Impaired UE functional use;Impaired tone   Rehab Potential Good   OT Frequency 2x / week   OT Duration 8 weeks   OT Treatment/Interventions Self-care/ADL training;Electrical Stimulation;Ultrasound;Fluidtherapy;Therapeutic exercise;Neuromuscular education;Manual Therapy;Functional Mobility  Training;DME and/or AE instruction;Energy conservation;Cognitive remediation/compensation;Visual/perceptual remediation/compensation;Patient/family education;Balance training;Therapeutic exercises;Therapeutic activities        Problem List Patient Active Problem List   Diagnosis Date Noted  . Diabetes mellitus type 2 in nonobese (HCC)   . Hypoalbuminemia due to protein-calorie malnutrition ([place]) 10/15/2015  . Poorly controlled type 2 diabetes mellitus with peripheral neuropathy ([place]) 10/14/2015  . History of CVA with residual deficit 10/14/2015  . Hemiparesis affecting dominant side as late effect of stroke ([place]) 10/14/2015  . Depression 10/14/2015  . Basal ganglia infarction ([place]) 10/14/2015  . Acute ischemic stroke ([place])   . Left-sided weakness 10/12/2015  . Abnormal CXR 10/12/2015  . BPH associated with nocturia 08/20/2015  . Aftercare following surgery of the circulatory system, [place] 02/13/2014  . Postop check 02/06/2014  . Left inguinal hernia 12/28/2013  . Rib fractures 10/13/2013  . Fall 10/13/2013  . Dysplastic nevus of trunk 02/14/2013  . Bicipital tendinitis of right shoulder 07/12/2012  . Subacromial bursitis, right 03/28/2012  . Right spastic hemiparesis ([place]) 02/02/2012  . Occlusion and stenosis of carotid artery with cerebral infarction 01/26/2012  . Hemiplegia affecting right dominant side ([place]) 12/24/2011  . CVA (cerebral infarction) 12/15/2011  . Occlusion and stenosis  of carotid artery without mention of cerebral infarction 12/08/2011  . Carotid artery plaque 12/04/2011  . Left carotid bruit 11/30/2011  . AUTONOMIC NEUROPATHY, DIABETIC 11/25/2010  . Hyperlipidemia 03/15/2008  . Obstructive sleep apnea 07/27/2007  . Diabetes 1.5, managed as type 2 ([place]) 06/15/2007  . Essential hypertension 06/15/2007    Mariah Milling, OTR/L 11/21/2015, 8:08 PM  [facility] [address] [place], [place], [address] Phone: [phone]   Fax:  [phone]  Name: Kinnith Omdahl MRN: [id number] Date of Birth: [birth year]

## 2015-11-25 ENCOUNTER — Encounter: Payer: Self-pay | Admitting: Rehabilitation

## 2015-11-25 ENCOUNTER — Encounter: Payer: Self-pay | Admitting: Occupational Therapy

## 2015-11-25 ENCOUNTER — Ambulatory Visit: Payer: Medicare Other | Admitting: Rehabilitation

## 2015-11-25 ENCOUNTER — Ambulatory Visit: Payer: Medicare Other | Admitting: Occupational Therapy

## 2015-11-25 DIAGNOSIS — F0789 Other personality and behavioral disorders due to known physiological condition: Secondary | ICD-10-CM | POA: Diagnosis not present

## 2015-11-25 DIAGNOSIS — F09 Unspecified mental disorder due to known physiological condition: Secondary | ICD-10-CM

## 2015-11-25 DIAGNOSIS — R2689 Other abnormalities of gait and mobility: Secondary | ICD-10-CM

## 2015-11-25 DIAGNOSIS — R6889 Other general symptoms and signs: Secondary | ICD-10-CM

## 2015-11-25 DIAGNOSIS — R2681 Unsteadiness on feet: Secondary | ICD-10-CM

## 2015-11-25 DIAGNOSIS — I69359 Hemiplegia and hemiparesis following cerebral infarction affecting unspecified side: Secondary | ICD-10-CM

## 2015-11-25 DIAGNOSIS — G8111 Spastic hemiplegia affecting right dominant side: Secondary | ICD-10-CM

## 2015-11-25 DIAGNOSIS — R531 Weakness: Secondary | ICD-10-CM | POA: Diagnosis not present

## 2015-11-25 DIAGNOSIS — R269 Unspecified abnormalities of gait and mobility: Secondary | ICD-10-CM

## 2015-11-25 NOTE — Therapy (Signed)
[facility] [address] [place], [place], [address] Phone: [phone]   Fax:  [phone]  Occupational Therapy Treatment  Patient Details  Name: Darrell [name] MRN: [id number] Date of Birth: 1945/11/19 Referring Provider: Dr Letta Pate  Encounter Date: 11/25/2015      OT End of Session - 11/25/15 1636    Visit Number 4   Number of Visits 17   Date for OT Re-Evaluation 01/10/16   Authorization Type Medicare - g code and progress note visit 10   Authorization - Visit Number 4   Authorization - Number of Visits 10   OT Start Time [id number]   OT Stop Time 1615   OT Time Calculation (min) 42 min   Activity Tolerance Patient tolerated treatment well   Behavior During Therapy [facility] for tasks assessed/performed      Past Medical History  Diagnosis Date  . Diabetes mellitus   . Hypertension     dr Sherren Mocha  . Sleep apnea     cpap      sleep study > 4 yrs; no longer wearing CPAP  . Stroke [facility])     rt side weakness, 2013  . Poorly controlled type 2 diabetes mellitus with peripheral neuropathy ([place]) 10/14/2015  . History of CVA with residual deficit 10/14/2015    Past Surgical History  Procedure Laterality Date  . Heel spur surgery    . [place] surgery    . Endarterectomy  12/09/2011    Procedure: ENDARTERECTOMY CAROTID;  Surgeon: Tinnie Gens, MD;  Location: [facility] OR;  Service: Vascular;  Laterality: Left;  Left Carotid endarterectomy with dacron patch angioplasty  . Carotid endarterectomy  12/09/11    Left  . Hernia repair      x2  . Ankle surgery    . Inguinal hernia repair Left 01/22/2014    Procedure: LEFT INGUINAL HERNIA REPAIR WITH MESH;  Surgeon: [name], MD;  Location: [place];  Service: General;  Laterality: Left;  . Insertion of mesh Left 01/22/2014    Procedure: INSERTION OF MESH;  Surgeon: [name], MD;  Location: [place];  Service: General;  Laterality: Left;  . Colonoscopy      There were no vitals  filed for this visit.  Visit Diagnosis:  Unsteadiness  Hemiparesis affecting nondominant side as late effect of cerebrovascular accident [facility])  Right spastic hemiplegia ([place])  Cognitive and neurobehavioral dysfunction  Decreased activity tolerance      Subjective Assessment - 11/25/15 1624    Subjective  I want to move around without my cane.  Patient can now walk indoors with supervision without his cane per PT.   Patient Stated Goals Greater independence with activities of daily living   Currently in Pain? No/denies   Pain Score 0-No pain                      OT Treatments/Exercises (OP) - 11/25/15 0001    ADLs   UB Dressing Patient able to independently don jacket today - did not zip.    Functional Mobility Patient now able to walk indoors without his cane with supervision.  Worked with patient to address conditioning while on his feet.  Patient able to put away dishes at eye level, pick up objects from floor, and carry weighted crate with hand holds while walking without distractions.  Patient demonstrates increased shortness of breath when dual tasking, especially when right arm involved in task.  ADL Comments Patient has functional ability in right hand although is limited by significant learned non use.  He requires constant reminders to utilize right hand.  Today he was able to open, close latch style door, turn on and off light switch, pick up wall phone receiver and bring toward ear, wipe down counter surface, etc.  Patient may benefit from some forced use concepts initially in the clinic and then at home.     Cognitive Exercises   Attention Span Sustained Patient with limited ability to sustain attention to more than one directive at a time, especially realting to directionality.  Patient at times requires physical redirection to follow steps of task.  (Perception as well as cognitive impairment most likely)   Neurological Re-education Exercises   Other  Exercises 1 Able to complete 10 forward and backward revolutions on UBE with right hand wrapped, after initial facilitation.                  OT Education - 11/25/15 1636    Education provided Yes   Education Details importance of attempting to utilize right UE in functional situations   Person(s) Educated Patient   Methods Explanation;Demonstration   Comprehension Need further instruction;Tactile cues required;Verbal cues required          OT Short Term Goals - 11/18/15 1641    OT SHORT TERM GOAL #1   Title Patient will complete home activity program with min cueing   Status On-going   OT SHORT TERM GOAL #2   Title Patient will don front opening shirt with no more than set up assistance   Status On-going   OT SHORT TERM GOAL #3   Title Patient will step over lip of shower stall with no greater than supervision   Status On-going   OT SHORT TERM GOAL #4   Title Patient will complete all aspects of self bathing while in the shower with modified independence   Status On-going           OT Long Term Goals - 11/18/15 1642    OT LONG TERM GOAL #1   Title Patient will complete home activity / home exercise program independently   Status On-going   OT LONG TERM GOAL #2   Title Patient will shower with modifeid independence   Status On-going   OT LONG TERM GOAL #3   Title Patient will complete 15-20  minutes of ADL/IADL without a rest break, includes sitting, standing, and walking, e.g. getting dressed and making the bed   Status On-going   OT LONG TERM GOAL #4   Title Patient will dress himself with modifeid independence in nder 10 minutes - casula clothing   Status On-going   OT LONG TERM GOAL #5   Title Patient will demonstrate at least 3 lb increase in left pinch strength to ease independence with ADL   Status On-going               Plan - 11/25/15 1637    Clinical Impression Statement Patient is showing much broader range of right UE use, left UE  strength / conditioning, and overall functional mobility.  Progressing toward OT goals.   Pt will benefit from skilled therapeutic intervention in order to improve on the following deficits (Retired) Decreased activity tolerance;Decreased balance;Decreased cognition;Decreased coordination;Decreased endurance;Decreased safety awareness;Decreased range of motion;Decreased knowledge of use of DME;Decreased knowledge of precautions;Decreased strength;Impaired perceived functional ability;Impaired flexibility;Improper body mechanics;Impaired vision/preception;Impaired UE functional use;Impaired tone   Rehab Potential Good  OT Frequency 2x / week   OT Duration 8 weeks   OT Treatment/Interventions Self-care/ADL training;Electrical Stimulation;Ultrasound;Fluidtherapy;Therapeutic exercise;Neuromuscular education;Manual Therapy;Functional Mobility Training;DME and/or AE instruction;Energy conservation;Cognitive remediation/compensation;Visual/perceptual remediation/compensation;Patient/family education;Balance training;Therapeutic exercises;Therapeutic activities   Plan forced use right UE, left UE strengthening / conditioning, dynamic balance   Consulted and Agree with Plan of Care Patient        Problem List Patient Active Problem List   Diagnosis Date Noted  . Diabetes mellitus type 2 in nonobese (HCC)   . Hypoalbuminemia due to protein-calorie malnutrition ([place]) 10/15/2015  . Poorly controlled type 2 diabetes mellitus with peripheral neuropathy ([place]) 10/14/2015  . History of CVA with residual deficit 10/14/2015  . Hemiparesis affecting dominant side as late effect of stroke ([place]) 10/14/2015  . Depression 10/14/2015  . Basal ganglia infarction ([place]) 10/14/2015  . Acute ischemic stroke ([place])   . Left-sided weakness 10/12/2015  . Abnormal CXR 10/12/2015  . BPH associated with nocturia 08/20/2015  . Aftercare following surgery of the circulatory system, [place] 02/13/2014  . Postop check 02/06/2014   . Left inguinal hernia 12/28/2013  . Rib fractures 10/13/2013  . Fall 10/13/2013  . Dysplastic nevus of trunk 02/14/2013  . Bicipital tendinitis of right shoulder 07/12/2012  . Subacromial bursitis, right 03/28/2012  . Right spastic hemiparesis ([place]) 02/02/2012  . Occlusion and stenosis of carotid artery with cerebral infarction 01/26/2012  . Hemiplegia affecting right dominant side ([place]) 12/24/2011  . CVA (cerebral infarction) 12/15/2011  . Occlusion and stenosis of carotid artery without mention of cerebral infarction 12/08/2011  . Carotid artery plaque 12/04/2011  . Left carotid bruit 11/30/2011  . AUTONOMIC NEUROPATHY, DIABETIC 11/25/2010  . Hyperlipidemia 03/15/2008  . Obstructive sleep apnea 07/27/2007  . Diabetes 1.5, managed as type 2 ([place]) 06/15/2007  . Essential hypertension 06/15/2007    Mariah Milling, OTR/L 11/25/2015, 4:40 PM  [facility] [address] [place], [place], [address] Phone: [phone]   Fax:  [phone]  Name: Darrell [name] MRN: [id number] Date of Birth: 02/03/1946

## 2015-11-25 NOTE — Therapy (Signed)
[facility] [address] [place], [place], [address] Phone: [phone]   Fax:  [phone]  Physical Therapy Treatment  Patient Details  Name: Darrell [name] MRN: [id number] Date of Birth: [birth year] Referring Provider: Alysia Penna, MD  Encounter Date: 11/25/2015      PT End of Session - 11/25/15 1443    Visit Number 4   Number of Visits 17   Date for PT Re-Evaluation 01/10/16   Authorization Type MCR primary-G codes due every 10th visit, [employer] secondary   PT Start Time 1440   PT Stop Time 1527   PT Time Calculation (min) 47 min   Equipment Utilized During Treatment Gait belt   Activity Tolerance Patient tolerated treatment well   Behavior During Therapy [facility] for tasks assessed/performed      Past Medical History  Diagnosis Date  . Diabetes mellitus   . Hypertension     dr Sherren Mocha  . Sleep apnea     cpap      sleep study > 4 yrs; no longer wearing CPAP  . Stroke [facility])     rt side weakness, 2013  . Poorly controlled type 2 diabetes mellitus with peripheral neuropathy ([place]) 10/14/2015  . History of CVA with residual deficit 10/14/2015    Past Surgical History  Procedure Laterality Date  . Heel spur surgery    . [place] surgery    . Endarterectomy  12/09/2011    Procedure: ENDARTERECTOMY CAROTID;  Surgeon: Tinnie Gens, MD;  Location: [facility] OR;  Service: Vascular;  Laterality: Left;  Left Carotid endarterectomy with dacron patch angioplasty  . Carotid endarterectomy  12/09/11    Left  . Hernia repair      x2  . Ankle surgery    . Inguinal hernia repair Left 01/22/2014    Procedure: LEFT INGUINAL HERNIA REPAIR WITH MESH;  Surgeon: [name], MD;  Location: [place];  Service: General;  Laterality: Left;  . Insertion of mesh Left 01/22/2014    Procedure: INSERTION OF MESH;  Surgeon: [name], MD;  Location: [place];  Service: General;  Laterality: Left;  . Colonoscopy      There were no vitals filed  for this visit.  Visit Diagnosis:  Abnormality of gait  Poor balance  Unsteadiness  Hemiparesis affecting nondominant side as late effect of cerebrovascular accident [facility]      Subjective Assessment - 11/25/15 1443    Subjective no new complaints, no changes since last visit.     Patient Stated Goals "to improve my balance"    Currently in Pain? No/denies        Gait:  Assessed gait over indoor surfaces with and without AD in order to better assess STG.  Discussed having pt begin to walk at home without forearm crutch with S from wife.  Pt verbalized understanding and states that he does not have any thresholds in the home to step over nor throw rugs in home.  Also assessed stairs as pt has approx 15 steps with B rails to get upstairs, but he reports that he is not currently doing them.  Performed during session x 12, 6" steps with L rail at S level.  Again, encouraged pt to perform at home with S.      NMR:  Performed counter top activity for balance and NMR in BLEs; side stepping with lunge with cues for "pushing" off with distal LE with large, controlled stepping.  Performed x 3 reps down  and back.  Then performed standing marching to address strength and modified SLS.  Cues for increased hip and knee flex, performed x 10 reps each side.  Then worked on high level balance and proximal LE stabilization in // bars as follows; standing with RLE in stance on airdex foam while tapping LLE to two cones then placing back to floor to address instability in RLE and address functional strength in LLE with hip and knee flex>progressing to LLE in stance while RLE tapped to cones.  Note marked difficulty with pt being able to process more than single step command in session and marked delay in processing and requires several verbal and demonstration cues to complete task.  Performed x 10 reps with single UE support progressing to 10 reps without UE support.  Tolerated well.  Progressed to tall  kneeling task working on squats in this position x 10 reps with tactile and verbal cues for increased B hip extension and upright posture.  Then performed side stepping in tall kneeling for slow controlled stabilization in hip (pending stance hip) with cues for increased step width and slower speed.  Again, requires max verbal and demonstration cues to complete task.  Pt with increased cramping in hamstring with pt providing soft tissue mobilization as needed to relax.                      PT Education - 11/25/15 1443    Education provided Yes   Education Details gait indoors ONLY and with S from wife without forearm crutch   Person(s) Educated Patient   Methods Explanation   Comprehension Verbalized understanding          PT Short Term Goals - 11/25/15 1547    PT SHORT TERM GOAL #1   Title Pt will initiate HEP for strengthening and balance in order to decrease balance and improve functional strength.  (Target Date: 12/09/15)   PT SHORT TERM GOAL #2   Title Pt will improve DGI score to 17/24 in order to indicate decreased fall risk and improved functional balance.     PT SHORT TERM GOAL #3   Title Pt will perform 8/10 sit<>stand with single UE support at mod I level in order to indicate improvement in functional strength.    PT SHORT TERM GOAL #4   Title Pt will increase gait speed to 2.60 ft/sec in order to indicate decreased fall risk and improved efficiency of gait.     PT SHORT TERM GOAL #5   Title Pt will ambulate 200' over indoor surfaces without AD in order to indicate safe home negotiation and progression to PLOF.     PT SHORT TERM GOAL #6   Title Will assess 6MWT and improve distance by 150' in order to indicate improvement in functional endurance.     Baseline 661' on 11/26/15 w/ min/guard A           PT Long Term Goals - 11/11/15 1918    PT LONG TERM GOAL #1   Title Pt will be independent with HEP for BLE strengthening and balance to indicate decreased fall  risk and improvement in functional mobility.  (Target Date: 01/06/16)   PT LONG TERM GOAL #2   Title Pt will perform 8/10 sit<>stand without UE support at mod I level to indicate improvement in functional strength.    PT LONG TERM GOAL #3   Title Pt will ambulate >1000' over varying community surfaces (including grass, curb, and ramp) at  mod I level with use of L forearm crutch to indicate safe return to community at [facility].     PT LONG TERM GOAL #4   Title Pt will be able to ambulate around neighborhood "loop" (.6 mile) with L forearm crutch at mod I level to indicate improvement in functional endurance and return to leisure activity.     PT LONG TERM GOAL #5   Title Pt will increase DGI >19 points in order to indicate decreased fall risk.    Additional Long Term Goals   Additional Long Term Goals Yes   PT LONG TERM GOAL #6   Title Pt will increase SIS-mobility to 48.4% in order to indicate self reported improvement in mobility.                 Plan - 11/25/15 1443    Clinical Impression Statement Skilled session focused on gait without AD in order to address STG's.  Feel that he can ambulate at home without AD but with S from wife, and also perform stairs with S from wife and use of rail.  Remainder of session focused on high level balance and NMR for LLE stabilization and coordination.     Pt will benefit from skilled therapeutic intervention in order to improve on the following deficits Abnormal gait;Decreased activity tolerance;Decreased balance;Decreased cognition;Decreased endurance;Decreased knowledge of precautions;Decreased scar mobility;Decreased strength;Impaired perceived functional ability;Impaired flexibility;Impaired tone;Impaired UE functional use;Improper body mechanics;Postural dysfunction   Rehab Potential Good   Clinical Impairments Affecting Rehab Potential co-morbidities, prior CVA   PT Frequency 2x / week   PT Duration 8 weeks   PT Treatment/Interventions ADLs/Self  Care Home Management;Electrical Stimulation;DME Instruction;Gait training;Stair training;Functional mobility training;Therapeutic activities;Therapeutic exercise;Balance training;Neuromuscular re-education;Cognitive remediation;Patient/family education;Orthotic Fit/Training;Manual techniques;Vestibular   PT Next Visit Plan  work on gait without AD for indoor negotiation, balance and strengthening    Consulted and Agree with Plan of Care Patient;Family member/caregiver   Family Member Consulted wife        Problem List Patient Active Problem List   Diagnosis Date Noted  . Diabetes mellitus type 2 in nonobese (HCC)   . Hypoalbuminemia due to protein-calorie malnutrition ([place]) 10/15/2015  . Poorly controlled type 2 diabetes mellitus with peripheral neuropathy ([place]) 10/14/2015  . History of CVA with residual deficit 10/14/2015  . Hemiparesis affecting dominant side as late effect of stroke ([place]) 10/14/2015  . Depression 10/14/2015  . Basal ganglia infarction ([place]) 10/14/2015  . Acute ischemic stroke ([place])   . Left-sided weakness 10/12/2015  . Abnormal CXR 10/12/2015  . BPH associated with nocturia 08/20/2015  . Aftercare following surgery of the circulatory system, [place] 02/13/2014  . Postop check 02/06/2014  . Left inguinal hernia 12/28/2013  . Rib fractures 10/13/2013  . Fall 10/13/2013  . Dysplastic nevus of trunk 02/14/2013  . Bicipital tendinitis of right shoulder 07/12/2012  . Subacromial bursitis, right 03/28/2012  . Right spastic hemiparesis ([place]) 02/02/2012  . Occlusion and stenosis of carotid artery with cerebral infarction 01/26/2012  . Hemiplegia affecting right dominant side ([place]) 12/24/2011  . CVA (cerebral infarction) 12/15/2011  . Occlusion and stenosis of carotid artery without mention of cerebral infarction 12/08/2011  . Carotid artery plaque 12/04/2011  . Left carotid bruit 11/30/2011  . AUTONOMIC NEUROPATHY, DIABETIC 11/25/2010  . Hyperlipidemia 03/15/2008  .  Obstructive sleep apnea 07/27/2007  . Diabetes 1.5, managed as type 2 ([place]) 06/15/2007  . Essential hypertension 06/15/2007    Cameron Sprang, PT, MPT [facility] [address]  [place], [place], [address] Phone: [phone]   Fax:  [phone] 11/25/2015, 3:52 PM  Name: Tavaris Stefka MRN: [id number] Date of Birth: 1946-02-28

## 2015-11-28 ENCOUNTER — Encounter: Payer: Self-pay | Admitting: Occupational Therapy

## 2015-11-28 ENCOUNTER — Ambulatory Visit: Payer: Medicare Other | Attending: Physical Medicine & Rehabilitation | Admitting: Physical Therapy

## 2015-11-28 ENCOUNTER — Encounter: Payer: Self-pay | Admitting: Physical Therapy

## 2015-11-28 ENCOUNTER — Ambulatory Visit: Payer: Medicare Other | Admitting: Occupational Therapy

## 2015-11-28 DIAGNOSIS — R2689 Other abnormalities of gait and mobility: Secondary | ICD-10-CM | POA: Diagnosis not present

## 2015-11-28 DIAGNOSIS — F09 Unspecified mental disorder due to known physiological condition: Secondary | ICD-10-CM

## 2015-11-28 DIAGNOSIS — G8191 Hemiplegia, unspecified affecting right dominant side: Secondary | ICD-10-CM | POA: Insufficient documentation

## 2015-11-28 DIAGNOSIS — I69359 Hemiplegia and hemiparesis following cerebral infarction affecting unspecified side: Secondary | ICD-10-CM

## 2015-11-28 DIAGNOSIS — R531 Weakness: Secondary | ICD-10-CM | POA: Diagnosis not present

## 2015-11-28 DIAGNOSIS — R6889 Other general symptoms and signs: Secondary | ICD-10-CM | POA: Insufficient documentation

## 2015-11-28 DIAGNOSIS — G8111 Spastic hemiplegia affecting right dominant side: Secondary | ICD-10-CM | POA: Insufficient documentation

## 2015-11-28 DIAGNOSIS — F0789 Other personality and behavioral disorders due to known physiological condition: Secondary | ICD-10-CM | POA: Diagnosis not present

## 2015-11-28 DIAGNOSIS — M21371 Foot drop, right foot: Secondary | ICD-10-CM | POA: Insufficient documentation

## 2015-11-28 DIAGNOSIS — R2681 Unsteadiness on feet: Secondary | ICD-10-CM | POA: Diagnosis not present

## 2015-11-28 DIAGNOSIS — R269 Unspecified abnormalities of gait and mobility: Secondary | ICD-10-CM | POA: Insufficient documentation

## 2015-11-28 NOTE — Patient Instructions (Signed)
It is important to find simple jobs for your right hand to do every day. Try to complete at least three of these or other activities with your RIGHT hand each day. Feel free to add to this list!  These are 10 things you were able to do in the clinic.    1)  Open and close a door with a latch (not a doorknob) 2)  Open and close a sliding drawer 3)  Wipe off a counter top 4)  Clean a mirror at waist height 5)  Pick up a wall phone receiver and bring it toward your ear 6)  Turn on and off a light switch 7)  Turn on and off a water faucet 8)  Turn water faucet to hot or cold 9)  Pick up a small cloth and carry it to the laundry basket 10)  Carry a small weighted crate with two hands

## 2015-11-28 NOTE — Therapy (Signed)
[facility] [address] [place], [place], [address] Phone: [phone]   Fax:  [phone]  Physical Therapy Treatment  Patient Details  Name: Darrell [name] MRN: [id number] Date of Birth: 05-28-1946 Referring Provider: Alysia Penna, MD  Encounter Date: 11/28/2015      PT End of Session - 11/28/15 1406    Visit Number 5   Number of Visits 17   Date for PT Re-Evaluation 01/10/16   Authorization Type MCR primary-G codes due every 10th visit, [employer] secondary   PT Start Time 1402   PT Stop Time 1445   PT Time Calculation (min) 43 min   Equipment Utilized During Treatment Gait belt   Activity Tolerance Patient tolerated treatment well   Behavior During Therapy [facility] for tasks assessed/performed      Past Medical History  Diagnosis Date  . Diabetes mellitus   . Hypertension     dr Sherren Mocha  . Sleep apnea     cpap      sleep study > 4 yrs; no longer wearing CPAP  . Stroke [facility])     rt side weakness, 2013  . Poorly controlled type 2 diabetes mellitus with peripheral neuropathy ([place]) 10/14/2015  . History of CVA with residual deficit 10/14/2015    Past Surgical History  Procedure Laterality Date  . Heel spur surgery    . [place] surgery    . Endarterectomy  12/09/2011    Procedure: ENDARTERECTOMY CAROTID;  Surgeon: Tinnie Gens, MD;  Location: [facility] OR;  Service: Vascular;  Laterality: Left;  Left Carotid endarterectomy with dacron patch angioplasty  . Carotid endarterectomy  12/09/11    Left  . Hernia repair      x2  . Ankle surgery    . Inguinal hernia repair Left 01/22/2014    Procedure: LEFT INGUINAL HERNIA REPAIR WITH MESH;  Surgeon: [name], MD;  Location: [place];  Service: General;  Laterality: Left;  . Insertion of mesh Left 01/22/2014    Procedure: INSERTION OF MESH;  Surgeon: [name], MD;  Location: [place];  Service: General;  Laterality: Left;  . Colonoscopy      There were no vitals filed  for this visit.  Visit Diagnosis:  Unsteadiness  Hemiparesis affecting nondominant side as late effect of cerebrovascular accident ([place])  Decreased activity tolerance  Abnormality of gait  Unsteadiness on feet  Poor balance  Generalized weakness      Subjective Assessment - 11/28/15 1406    Subjective no new complaints, no changes since last visit.  Reports no pain or falls. Has been walking without crutch at home with spouse near by only without any issues.   Limitations Walking;House hold activities   Patient Stated Goals "to improve my balance"    Currently in Pain? No/denies   Pain Score 0-No pain             [facility] Adult PT Treatment/Exercise - 11/28/15 1410    Ambulation/Gait   Ambulation/Gait Yes   Ambulation/Gait Assistance 4: Min guard;4: Min assist   Ambulation/Gait Assistance Details min- mod cues for increased right step lenght, increased right foot clearance and for increase right UE elbow extension/relaxaiton. occasional right toe scuffing noted, minimal assist to correct at times.  Ambulation Distance (Feet) 460 Feet   Assistive device None   Gait Pattern Step-through pattern;Decreased arm swing - right;Decreased step length - right;Decreased step length - left;Decreased stance time - right;Decreased stride length;Decreased hip/knee flexion - right;Decreased dorsiflexion - right;Trunk flexed;Poor foot clearance - right   Ambulation Surface Level;Indoor          Balance Exercises - 11/28/15 1442    Balance Exercises: Standing   Other Standing Exercises at stairs using bottom 3 steps: single UE support: tapping up/down bottom 3 stairs x 10 each side. cues on right hip/knee flexion with each rep, cues on right leg/knee stability with stance and cues on sequency with both legs tapping.                                       Neuro Re-ed: Attemtped to get pt into prone several ways, pt with difficulty proccessing  single step instrucitons, needed verabl, visual and tactile cues to attempt to roll onto stomach. Unable to get past side lying. Then attempted to go from quadruped, to prone on elbows into prone on stomach. Despite maximal cues pt unable to get arms down for prone position.  Since unable to achieve prone, attempted quadruped over ball. After increased time, step by step cues (verbal, tactile and visual) was able to partially achieve this position. Once in the position attempted weight shifting, however pt unable to keep hand in contact with mat and reported right hamstring cramping. Therefore had pt come back up into tall kneeling with hands on pball. (assist and cues needed for pt to keep right hand in contact with ball).  Tall kneeling with ball, partial sit backs x 10 reps, max vebal/visual cues needed after manual facilitation x first 2-3 reps.  Pt continued to be focused on getting on not being able to lie on stomach through out remainder of session, needing redirection to tasks being performed. Pt kept stating "I am sorry I just can't get on my back".  PTA would clarify, "do you mean your stomach?", pt would agree "yes, my stomach", then PTA would remind pt "that's okay, we are doing other things". This occurred several time throughout remainder of session.          PT Short Term Goals - 11/25/15 1547    PT SHORT TERM GOAL #1   Title Pt will initiate HEP for strengthening and balance in order to decrease balance and improve functional strength.  (Target Date: 12/09/15)   PT SHORT TERM GOAL #2   Title Pt will improve DGI score to 17/24 in order to indicate decreased fall risk and improved functional balance.     PT SHORT TERM GOAL #3   Title Pt will perform 8/10 sit<>stand with single UE support at mod I level in order to indicate improvement in functional strength.    PT SHORT TERM GOAL #4   Title Pt will increase gait speed to 2.60 ft/sec in order to indicate decreased fall risk and  improved efficiency of gait.     PT SHORT TERM GOAL #5   Title Pt will ambulate 200' over indoor surfaces without AD in order to indicate safe home negotiation and progression to PLOF.     PT SHORT TERM GOAL #6   Title Will assess 6MWT and improve distance by 150' in order to indicate improvement in functional endurance.     Baseline 661' on 11/26/15  w/ min/guard A           PT Long Term Goals - 11/11/15 1918    PT LONG TERM GOAL #1   Title Pt will be independent with HEP for BLE strengthening and balance to indicate decreased fall risk and improvement in functional mobility.  (Target Date: 01/06/16)   PT LONG TERM GOAL #2   Title Pt will perform 8/10 sit<>stand without UE support at mod I level to indicate improvement in functional strength.    PT LONG TERM GOAL #3   Title Pt will ambulate >1000' over varying community surfaces (including grass, curb, and ramp) at mod I level with use of L forearm crutch to indicate safe return to community at [facility].     PT LONG TERM GOAL #4   Title Pt will be able to ambulate around neighborhood "loop" (.6 mile) with L forearm crutch at mod I level to indicate improvement in functional endurance and return to leisure activity.     PT LONG TERM GOAL #5   Title Pt will increase DGI >19 points in order to indicate decreased fall risk.    Additional Long Term Goals   Additional Long Term Goals Yes   PT LONG TERM GOAL #6   Title Pt will increase SIS-mobility to 48.4% in order to indicate self reported improvement in mobility.              Plan - 11/29/15 0908    Clinical Impression Statement Continued to work on gait without AD on level indoor surfaces. Attempted to work on right leg strengthening, cooridination and flexibility in prone, then quadruped however pt unble to acheive prone posiiton despite max cues/assistance. Increased difficulty to achieve tall kneeling and quadruped, maximal cues (verbal, tactile and visual) needed with increased time for  processing. Pt then with difficulty getting out of tall kneeling position on mat also needed maximal cues. Pt continued to need cues with remaining activites performed on technique and to stay on task (stair exercises). Decreased cognitive status discussed with primary PT, will continue to monitor and assess need for ST on an ongoing basis.                                                                PT Next Visit Plan  work on gait without AD for indoor negotiation, balance and strengthening         Problem List Patient Active Problem List   Diagnosis Date Noted  . Diabetes mellitus type 2 in nonobese (HCC)   . Hypoalbuminemia due to protein-calorie malnutrition ([place]) 10/15/2015  . Poorly controlled type 2 diabetes mellitus with peripheral neuropathy ([place]) 10/14/2015  . History of CVA with residual deficit 10/14/2015  . Hemiparesis affecting dominant side as late effect of stroke ([place]) 10/14/2015  . Depression 10/14/2015  . Basal ganglia infarction ([place]) 10/14/2015  . Acute ischemic stroke ([place])   . Left-sided weakness 10/12/2015  . Abnormal CXR 10/12/2015  . BPH associated with nocturia 08/20/2015  . Aftercare following surgery of the circulatory system, [place] 02/13/2014  . Postop check 02/06/2014  . Left inguinal hernia 12/28/2013  . Rib fractures 10/13/2013  . Fall 10/13/2013  . Dysplastic nevus of trunk 02/14/2013  . Bicipital tendinitis of right shoulder 07/12/2012  .  Subacromial bursitis, right 03/28/2012  . Right spastic hemiparesis ([place]) 02/02/2012  . Occlusion and stenosis of carotid artery with cerebral infarction 01/26/2012  . Hemiplegia affecting right dominant side ([place]) 12/24/2011  . CVA (cerebral infarction) 12/15/2011  . Occlusion and stenosis of carotid artery without mention of cerebral infarction 12/08/2011  . Carotid artery plaque 12/04/2011  . Left carotid bruit 11/30/2011  . AUTONOMIC NEUROPATHY, DIABETIC 11/25/2010  . Hyperlipidemia 03/15/2008  .  Obstructive sleep apnea 07/27/2007  . 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 Essential hypertension 06/15/2007    Willow Ora 11/29/2015, 9:15 AM  Willow Ora, PTA, [facility] [address], [place], [place] [address] [phone] 11/29/2015, 9:26 AM   Name: Darrell [name] MRN: [id number] Date of Birth: Dec 15, 1945

## 2015-11-28 NOTE — Therapy (Signed)
[facility] [address] [place], [place], [address] Phone: [phone]   Fax:  [phone]  Occupational Therapy Treatment  Patient Details  Name: Darrell [name] MRN: [id number] Date of Birth: 11-Apr-1946 Referring Provider: Dr Letta Pate  Encounter Date: 11/28/2015      OT End of Session - 11/28/15 1722    Visit Number 5   Number of Visits 17   Date for OT Re-Evaluation 01/10/16   Authorization Type Medicare - g code and progress note visit 10   Authorization - Visit Number 5   Authorization - Number of Visits 10   OT Start Time [id number]   OT Stop Time 1530   OT Time Calculation (min) 43 min   Activity Tolerance Patient tolerated treatment well   Behavior During Therapy [facility] for tasks assessed/performed      Past Medical History  Diagnosis Date  . Diabetes mellitus   . Hypertension     dr Sherren Mocha  . Sleep apnea     cpap      sleep study > 4 yrs; no longer wearing CPAP  . Stroke [facility])     rt side weakness, 2013  . Poorly controlled type 2 diabetes mellitus with peripheral neuropathy ([place]) 10/14/2015  . History of CVA with residual deficit 10/14/2015    Past Surgical History  Procedure Laterality Date  . Heel spur surgery    . [place] surgery    . Endarterectomy  12/09/2011    Procedure: ENDARTERECTOMY CAROTID;  Surgeon: Tinnie Gens, MD;  Location: [facility] OR;  Service: Vascular;  Laterality: Left;  Left Carotid endarterectomy with dacron patch angioplasty  . Carotid endarterectomy  12/09/11    Left  . Hernia repair      x2  . Ankle surgery    . Inguinal hernia repair Left 01/22/2014    Procedure: LEFT INGUINAL HERNIA REPAIR WITH MESH;  Surgeon: [name], MD;  Location: [place];  Service: General;  Laterality: Left;  . Insertion of mesh Left 01/22/2014    Procedure: INSERTION OF MESH;  Surgeon: [name], MD;  Location: [place];  Service: General;  Laterality: Left;  . Colonoscopy      There were no vitals  filed for this visit.  Visit Diagnosis:  Unsteadiness  Hemiparesis affecting nondominant side as late effect of cerebrovascular accident [facility])  Right spastic hemiplegia ([place])  Cognitive and neurobehavioral dysfunction      Subjective Assessment - 11/28/15 1657    Subjective  I know I am doing better   Patient Stated Goals Greater independence with activities of daily living   Currently in Pain? No/denies   Pain Score 0-No pain            [facility] OT Assessment - 11/28/15 0001    Coordination   9 Hole Peg Test Left   Left 9 Hole Peg Test 40.56   Hand Function   Left Hand Lateral Pinch 15 lbs                  OT Treatments/Exercises (OP) - 11/28/15 0001    ADLs   UB Dressing Patient able to don light weight jacket in standing, yet unable to zipper using two hands.   Functional Mobility Forced use concept for right hand use.  Patient's left hand wrapped to avoid use, and  worked with patient to create a list of 10 simple tasks he could complete with right arm.  Patient had difficulty periodically -  even with left hand wrapped, not initiating tasks with left hand.  Patient with significant balance disturbance with mobility while left hand wrapped.  In forced use situation, patient does have basic right hand functioning, but needs mod prompting to utilize.                Balance Exercises - 11/28/15 1442    Balance Exercises: Standing   Other Standing Exercises at stairs using bottom 3 steps: single UE support: tapping up/down bottom 3 stairs x 10 each side. cues on right hip/knee flexion with each rep, cues on right leg/knee stability with stance and cues on sequency with both legs tapping.                                          OT Education - 11/28/15 1722    Education provided Yes   Education Details right hand use   Person(s) Educated Patient   Methods Explanation;Demonstration   Comprehension Need further instruction;Tactile cues required;Verbal cues  required          OT Short Term Goals - 11/18/15 1641    OT SHORT TERM GOAL #1   Title Patient will complete home activity program with min cueing   Status On-going   OT SHORT TERM GOAL #2   Title Patient will don front opening shirt with no more than set up assistance   Status On-going   OT SHORT TERM GOAL #3   Title Patient will step over lip of shower stall with no greater than supervision   Status On-going   OT SHORT TERM GOAL #4   Title Patient will complete all aspects of self bathing while in the shower with modified independence   Status On-going           OT Long Term Goals - 11/28/15 1517    OT LONG TERM GOAL #5   Status --  15,15,15 11/28/15               Plan - 11/28/15 1723    Clinical Impression Statement Patient with significant memory deficits which limit carryover of new information.  Patient making significant improvement with functional mobility ,a nd left UE strength and coordination.     Pt will benefit from skilled therapeutic intervention in order to improve on the following deficits (Retired) Decreased activity tolerance;Decreased balance;Decreased cognition;Decreased coordination;Decreased endurance;Decreased safety awareness;Decreased range of motion;Decreased knowledge of use of DME;Decreased knowledge of precautions;Decreased strength;Impaired perceived functional ability;Impaired flexibility;Improper body mechanics;Impaired vision/preception;Impaired UE functional use;Impaired tone   Rehab Potential Good   OT Frequency 2x / week   OT Duration 8 weeks   OT Treatment/Interventions Self-care/ADL training;Electrical Stimulation;Ultrasound;Fluidtherapy;Therapeutic exercise;Neuromuscular education;Manual Therapy;Functional Mobility Training;DME and/or AE instruction;Energy conservation;Cognitive remediation/compensation;Visual/perceptual remediation/compensation;Patient/family education;Balance training;Therapeutic exercises;Therapeutic activities    Plan timed ADL task - shirt with buttons, socks, shoes, dynamic balance - standing, HEP Coordination for left, putty for left   OT Home Exercise Plan Initiated home activity program   Consulted and Agree with Plan of Care Patient        Problem List Patient Active Problem List   Diagnosis Date Noted  . Diabetes mellitus type 2 in nonobese (HCC)   . Hypoalbuminemia due to protein-calorie malnutrition ([place]) 10/15/2015  . Poorly controlled type 2 diabetes mellitus with peripheral neuropathy ([place]) 10/14/2015  . History of CVA with residual deficit 10/14/2015  . Hemiparesis affecting dominant side as late  effect of stroke ([place]) 10/14/2015  . Depression 10/14/2015  . Basal ganglia infarction ([place]) 10/14/2015  . Acute ischemic stroke ([place])   . Left-sided weakness 10/12/2015  . Abnormal CXR 10/12/2015  . BPH associated with nocturia 08/20/2015  . Aftercare following surgery of the circulatory system, [place] 02/13/2014  . Postop check 02/06/2014  . Left inguinal hernia 12/28/2013  . Rib fractures 10/13/2013  . Fall 10/13/2013  . Dysplastic nevus of trunk 02/14/2013  . Bicipital tendinitis of right shoulder 07/12/2012  . Subacromial bursitis, right 03/28/2012  . Right spastic hemiparesis ([place]) 02/02/2012  . Occlusion and stenosis of carotid artery with cerebral infarction 01/26/2012  . Hemiplegia affecting right dominant side ([place]) 12/24/2011  . CVA (cerebral infarction) 12/15/2011  . Occlusion and stenosis of carotid artery without mention of cerebral infarction 12/08/2011  . Carotid artery plaque 12/04/2011  . Left carotid bruit 11/30/2011  . AUTONOMIC NEUROPATHY, DIABETIC 11/25/2010  . Hyperlipidemia 03/15/2008  . Obstructive sleep apnea 07/27/2007  . Diabetes 1.5, managed as type 2 ([place]) 06/15/2007  . Essential hypertension 06/15/2007    Mariah Milling, OTR/L 11/28/2015, 5:26 PM  [facility] [address] [place], [place], [address] Phone: [phone]   Fax:  [phone]  Name: Darrell [name] MRN: [id number] Date of Birth: 07-Feb-1946

## 2015-12-02 ENCOUNTER — Ambulatory Visit (HOSPITAL_BASED_OUTPATIENT_CLINIC_OR_DEPARTMENT_OTHER): Payer: Medicare Other | Admitting: Physical Medicine & Rehabilitation

## 2015-12-02 ENCOUNTER — Encounter: Payer: Medicare Other | Attending: Physical Medicine & Rehabilitation

## 2015-12-02 ENCOUNTER — Encounter: Payer: Self-pay | Admitting: Physical Medicine & Rehabilitation

## 2015-12-02 VITALS — BP 137/74 | HR 71 | Resp 14

## 2015-12-02 DIAGNOSIS — E1342 Other specified diabetes mellitus with diabetic polyneuropathy: Secondary | ICD-10-CM | POA: Diagnosis not present

## 2015-12-02 DIAGNOSIS — G629 Polyneuropathy, unspecified: Secondary | ICD-10-CM | POA: Insufficient documentation

## 2015-12-02 DIAGNOSIS — I69393 Ataxia following cerebral infarction: Secondary | ICD-10-CM | POA: Diagnosis not present

## 2015-12-02 DIAGNOSIS — G4733 Obstructive sleep apnea (adult) (pediatric): Secondary | ICD-10-CM | POA: Insufficient documentation

## 2015-12-02 DIAGNOSIS — I10 Essential (primary) hypertension: Secondary | ICD-10-CM | POA: Insufficient documentation

## 2015-12-02 DIAGNOSIS — I69398 Other sequelae of cerebral infarction: Secondary | ICD-10-CM | POA: Diagnosis not present

## 2015-12-02 DIAGNOSIS — I639 Cerebral infarction, unspecified: Secondary | ICD-10-CM | POA: Diagnosis not present

## 2015-12-02 DIAGNOSIS — I69359 Hemiplegia and hemiparesis following cerebral infarction affecting unspecified side: Secondary | ICD-10-CM | POA: Diagnosis not present

## 2015-12-02 DIAGNOSIS — Z87891 Personal history of nicotine dependence: Secondary | ICD-10-CM | POA: Diagnosis not present

## 2015-12-02 DIAGNOSIS — I6381 Other cerebral infarction due to occlusion or stenosis of small artery: Secondary | ICD-10-CM

## 2015-12-02 DIAGNOSIS — R269 Unspecified abnormalities of gait and mobility: Secondary | ICD-10-CM | POA: Diagnosis not present

## 2015-12-02 NOTE — Progress Notes (Signed)
Subjective:    Patient ID: Darrell [name], male    DOB: 12/14/1945, 71 y.o.   MRN: [id number]  HPI 70 year old male with left PCA and left MCA distribution infarcts approximately 3 years ago who had onset of  Left-sided weakness in December 2016. He was admitted to inpatient rehabilitation from 1219 through 10/26/2015 and is now attending outpatient therapy. According to his wife he is doing a little bit better but is not back to his prior baseline. At one point he was walking up to an hour a day outside. He certainly is unable to do that the current time. Reviewing PT Notes, does have goals of walking up to 1000 feet  He still needs occasional assistance with showering. No assistance needed with dressing although he is slow Pain Inventory Average Pain 0 Pain Right Now 0 My pain is n/a  In the last 24 hours, has pain interfered with the following? General activity 0 Relation with others 0 Enjoyment of life 0 What TIME of day is your pain at its worst? n/a Sleep (in general) Good  Pain is worse with: n/a Pain improves with: n/a Relief from Meds: n/a  Mobility use a cane how many minutes can you walk? 60 ability to climb steps?  yes do you drive?  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  no  Physicians involved in your care Any changes since last visit?  no   Family History  Problem Relation Age of Onset  . Heart disease Father   . Colon cancer Father     at age 24  . Esophageal cancer Neg Hx   . Rectal cancer Neg Hx   . Stomach cancer Neg Hx    Social History   Social History  . Marital Status: Married    Spouse Name: N/A  . Number of Children: N/A  . Years of Education: N/A   Social History Main Topics  . Smoking status:  Former Smoker -- 2.50 packs/day for 30 years    Types: Cigarettes    Quit date: 12/07/2001  . Smokeless tobacco: Never Used  . Alcohol Use: No  . Drug Use: No  . Sexual Activity: Not Asked   Other Topics Concern  . None   Social History Narrative   Past Surgical History  Procedure Laterality Date  . Heel spur surgery    . B and E surgery    . Endarterectomy  12/09/2011    Procedure: ENDARTERECTOMY CAROTID;  Surgeon: Tinnie Gens, MD;  Location: [facility] OR;  Service: Vascular;  Laterality: Left;  Left Carotid endarterectomy with dacron patch angioplasty  . Carotid endarterectomy  12/09/11    Left  . Hernia repair      x2  . Ankle surgery    . Inguinal hernia repair Left 01/22/2014    Procedure: LEFT INGUINAL HERNIA REPAIR WITH MESH;  Surgeon: [name], MD;  Location: [place];  Service: General;  Laterality: Left;  . Insertion of mesh Left 01/22/2014    Procedure: INSERTION OF MESH;  Surgeon: [name], MD;  Location: [place];  Service: General;  Laterality: Left;  . Colonoscopy     Past Medical History  Diagnosis Date  . Diabetes mellitus   . Hypertension     dr Sherren Mocha  . Sleep  apnea     cpap      sleep study > 4 yrs; no longer wearing CPAP  . Stroke [facility])     rt side weakness, 2013  . Poorly controlled type 2 diabetes mellitus with peripheral neuropathy ([place]) 10/14/2015  . History of CVA with residual deficit 10/14/2015   BP 137/74 mmHg  Pulse 71  Resp 14  SpO2 95%     Review of Systems  Musculoskeletal: Positive for arthralgias and myalgias.  Neurological: Positive for weakness.  Psychiatric/Behavioral: Positive for confusion.       Objective:   Physical Exam Lungs are clear Heart regular rate and rhythm Abdomen positive bowel sounds soft nontender palpation Neurological: He is alert.  Psychiatric: He has a normal mood and affect.  Right shoulder abduction to 90.  Right hand strength 3 minus grip 3 minus deltoid 3/5 biceps and triceps. Decreased coordination  right hand. Right lower extremity 4 minus in the hip flexors knee extensors ankle dorsiflexors and plantar flexors.  Left side is 5/5 in the deltoid, biceps, triceps, grip Ambulation is without assistive device widened base of support. Using [place] crutch outside the house  Decreased finger thumb opposition right greater than left upper extremity     Assessment & Plan:  .Left MCA and left PCA infarct causing right hemiparesis and cognitive dysfunction. His main neurologic deficits pertaining to the left MCA and left PCA infarct. His newer problem is mainly balance related to the right brain stroke which caused some left hemiparesis which has mainly improved he just has some fine motor and balance   Return to clinic one month  Recommend continued outpatient therapy Subjective:    Patient ID: Darrell [name], male    DOB: 12/01/1945, 70 y.o.   MRN: [id number]  HPI   Pain Inventory Average Pain 0 Pain Right Now 0 My pain is no pain  In the last 24 hours, has pain interfered with the following? General activity 0 Relation with others 0 Enjoyment of life 0 What TIME of day is your pain at its worst? no pain Sleep (in general) Good  Pain is worse with: no pain Pain improves with: no pain Relief from Meds: no pain  Mobility walk with assistance use a cane do you drive?  no transfers alone Do you have any goals in this area?  yes  Function not employed: date last employed . retired I need assistance with the following:  dressing, bathing, meal prep, household duties and shopping  Neuro/Psych weakness trouble walking confusion  Prior Studies Any changes since last visit?  no  Physicians involved in your care Any changes since last visit?  no   Family History  Problem Relation Age of Onset  . Heart disease Father   . Colon cancer Father     at age 74  . Esophageal cancer Neg Hx   . Rectal cancer Neg Hx   . Stomach cancer Neg Hx    Social History    Social History  . Marital Status: Married    Spouse Name: N/A  . Number of Children: N/A  . Years of Education: N/A   Social History Main Topics  . Smoking status: Former Smoker -- 2.50 packs/day for 30 years    Types: Cigarettes    Quit date: 12/07/2001  . Smokeless tobacco: Never Used  . Alcohol Use: No  . Drug Use: No  . Sexual Activity: Not Asked   Other Topics Concern  . None   Social  History Narrative   Past Surgical History  Procedure Laterality Date  . Heel spur surgery    . [place] surgery    . Endarterectomy  12/09/2011    Procedure: ENDARTERECTOMY CAROTID;  Surgeon: Tinnie Gens, MD;  Location[facility] OR;  Service: Vascular;  Laterality: Left;  Left Carotid endarterectomy with dacron patch angioplasty  . Carotid endarterectomy  12/09/11    Left  . Hernia repair      x2  . Ankle surgery    . Inguinal hernia repair Left 01/22/2014    Procedure: LEFT INGUINAL HERNIA REPAIR WITH MESH;  Surgeon: [name], MD;  Location: [place];  Service: General;  Laterality: Left;  . Insertion of mesh Left 01/22/2014    Procedure: INSERTION OF MESH;  Surgeon: [name], MD;  Location: [place];  Service: General;  Laterality: Left;  . Colonoscopy     Past Medical History  Diagnosis Date  . Diabetes mellitus   . Hypertension     dr Sherren Mocha  . Sleep apnea     cpap      sleep study > 4 yrs; no longer wearing CPAP  . Stroke [facility])     rt side weakness, 2013  . Poorly controlled type 2 diabetes mellitus with peripheral neuropathy ([place]) 10/14/2015  . History of CVA with residual deficit 10/14/2015   BP 137/74 mmHg  Pulse 71  Resp 14  SpO2 95%  Opioid Risk Score:   Fall Risk Score:  `1  Depression screen PHQ 2/9  Depression screen [facility] 2/9 12/02/2015 08/20/2015 02/20/2014 12/28/2013 12/28/2013  Decreased Interest 0 1 0 0 -  Down, Depressed, Hopeless 0 1 0 0 1  PHQ - 2 Score 0 2 0 0 1  Altered sleeping 0 - - - -  Tired, decreased energy 3 - - - -  Change in appetite 0 - - - -   Feeling bad or failure about yourself  0 - - - -  Trouble concentrating 0 - - - -  Moving slowly or fidgety/restless 0 - - - -  Suicidal thoughts 0 - - - -  PHQ-9 Score 3 - - - -  Difficult doing work/chores Somewhat difficult - - - -     Review of Systems  Endocrine:       High blood sugar  Musculoskeletal: Positive for gait problem.  Neurological: Positive for weakness.  Psychiatric/Behavioral: Positive for confusion.       Objective:   Physical Exam        Assessment & Plan:

## 2015-12-02 NOTE — Patient Instructions (Signed)
Try holding the Myrbetriq medication for several days If you urinate very frequently please restart

## 2015-12-03 ENCOUNTER — Ambulatory Visit (INDEPENDENT_AMBULATORY_CARE_PROVIDER_SITE_OTHER): Payer: Medicare Other | Admitting: Family Medicine

## 2015-12-03 ENCOUNTER — Ambulatory Visit: Payer: Medicare Other | Admitting: *Deleted

## 2015-12-03 ENCOUNTER — Encounter: Payer: Self-pay | Admitting: Family Medicine

## 2015-12-03 VITALS — BP 130/80 | Temp 98.6°F | Wt 202.0 lb

## 2015-12-03 DIAGNOSIS — I639 Cerebral infarction, unspecified: Secondary | ICD-10-CM | POA: Diagnosis not present

## 2015-12-03 DIAGNOSIS — R2681 Unsteadiness on feet: Secondary | ICD-10-CM | POA: Diagnosis not present

## 2015-12-03 DIAGNOSIS — F09 Unspecified mental disorder due to known physiological condition: Secondary | ICD-10-CM

## 2015-12-03 DIAGNOSIS — R531 Weakness: Secondary | ICD-10-CM | POA: Diagnosis not present

## 2015-12-03 DIAGNOSIS — R2689 Other abnormalities of gait and mobility: Secondary | ICD-10-CM | POA: Diagnosis not present

## 2015-12-03 DIAGNOSIS — G8191 Hemiplegia, unspecified affecting right dominant side: Secondary | ICD-10-CM

## 2015-12-03 DIAGNOSIS — I10 Essential (primary) hypertension: Secondary | ICD-10-CM

## 2015-12-03 DIAGNOSIS — R6889 Other general symptoms and signs: Secondary | ICD-10-CM | POA: Diagnosis not present

## 2015-12-03 DIAGNOSIS — F0789 Other personality and behavioral disorders due to known physiological condition: Secondary | ICD-10-CM

## 2015-12-03 DIAGNOSIS — I69359 Hemiplegia and hemiparesis following cerebral infarction affecting unspecified side: Secondary | ICD-10-CM | POA: Diagnosis not present

## 2015-12-03 DIAGNOSIS — R269 Unspecified abnormalities of gait and mobility: Secondary | ICD-10-CM | POA: Diagnosis not present

## 2015-12-03 MED ORDER — LISINOPRIL 10 MG PO TABS
ORAL_TABLET | ORAL | Status: DC
Start: 2015-12-03 — End: 2015-12-31

## 2015-12-03 NOTE — Therapy (Signed)
[facility] [address] [place], [place], [address] Phone: [phone]   Fax:  [phone]  Occupational Therapy Treatment  Patient Details  Name: Darrell [name] MRN: [id number] Date of Birth: 05-28-1946 Referring Provider: Dr Letta Pate  Encounter Date: 12/03/2015      OT End of Session - 12/03/15 1614    Visit Number 6   Number of Visits 17   Date for OT Re-Evaluation 01/10/16   Authorization Type Medicare - g code and progress note visit 10   Authorization - Visit Number 6   Authorization - Number of Visits 10   OT Start Time [id number]   OT Stop Time 1625   OT Time Calculation (min) 55 min   Activity Tolerance Patient tolerated treatment well   Behavior During Therapy [facility] for tasks assessed/performed      Past Medical History  Diagnosis Date  . Diabetes mellitus   . Hypertension     dr Sherren Mocha  . Sleep apnea     cpap      sleep study > 4 yrs; no longer wearing CPAP  . Stroke [facility])     rt side weakness, 2013  . Poorly controlled type 2 diabetes mellitus with peripheral neuropathy ([place]) 10/14/2015  . History of CVA with residual deficit 10/14/2015    Past Surgical History  Procedure Laterality Date  . Heel spur surgery    . [place] surgery    . Endarterectomy  12/09/2011    Procedure: ENDARTERECTOMY CAROTID;  Surgeon: Tinnie Gens, MD;  Location: [facility] OR;  Service: Vascular;  Laterality: Left;  Left Carotid endarterectomy with dacron patch angioplasty  . Carotid endarterectomy  12/09/11    Left  . Hernia repair      x2  . Ankle surgery    . Inguinal hernia repair Left 01/22/2014    Procedure: LEFT INGUINAL HERNIA REPAIR WITH MESH;  Surgeon: [name], MD;  Location: [facility];  Service: General;  Laterality: Left;  . Insertion of mesh Left 01/22/2014    Procedure: INSERTION OF MESH;  Surgeon: [name], MD;  Location: [facility];  Service: General;  Laterality: Left;  . Colonoscopy      There were no vitals  filed for this visit.  Visit Diagnosis:  Poor balance  Generalized weakness  Hemiplegia affecting right dominant side (HCC)  Cognitive and neurobehavioral dysfunction      Subjective Assessment - 12/03/15 1533    Subjective  I feel like my stroke in December was a whole lot worse than I first thought. I feel more fatigued today, I've been on my feet more than normal. My vision seems off too, more blurring of vision.    Pertinent History see epic   Patient Stated Goals just don't have me lay on my back for therapy   Currently in Pain? No/denies       Treatment:  Pt worked on un-buttoning/buttoning shirt using left hand, pt reports he does this independently at home. Pt also worked on doffing/donning of bilateral shoes, pt has elastic shoe strings to increase independence with this. Pt engaged in therapeutic exercise focusing on dynamic standing balance with NO UE support, tapping block with right and left feet (one at a time). Pt with difficulty following directions/commands with this exercise. Pt had difficult time telling difference in left and right. Pt with decreased strength/coordination in right foot for tapping and needing cueing to march prior to tapping (this helped tremendously). Pt then engaged in  LUE coordination exercises using small ball for rotating clockwise/counterclockwise, putting pennies in container, stacking pennis, tossing ball with left hand & back/forth to therapist. Pt then engaged in therapeutic activity focusing on problem solving and L hand coordination. Pt looked at picture and imitated in on small peg board. Pt required moderate multimodal cueing to correctly place pegs. Therapist talked patient through problem solving to increase independence with this task. At end of session, ambulated with patient to waiting room to wait on wife.                          OT Education - 12/03/15 1609    Education provided Yes   Education Details ongoing  education on right hand use and education on problem solving techniques during therapeutic acitivty    Person(s) Educated Patient   Methods Explanation   Comprehension Need further instruction          OT Short Term Goals - 12/03/15 1610    OT SHORT TERM GOAL #1   Title Patient will complete home activity program with min cueing   Status On-going   OT SHORT TERM GOAL #2   Title Patient will don front opening shirt with no more than set up assistance   Status On-going   OT SHORT TERM GOAL #3   Title Patient will step over lip of shower stall with no greater than supervision   Status On-going   OT SHORT TERM GOAL #4   Title Patient will complete all aspects of self bathing while in the shower with modified independence   Status On-going           OT Long Term Goals - 12/03/15 1611    OT LONG TERM GOAL #1   Title Patient will complete home activity / home exercise program independently   Status On-going   OT LONG TERM GOAL #2   Title Patient will shower with modifeid independence   Status On-going   OT LONG TERM GOAL #3   Title Patient will complete 15-20  minutes of ADL/IADL without a rest break, includes sitting, standing, and walking, e.g. getting dressed and making the bed   Status On-going   OT LONG TERM GOAL #4   Title Patient will dress himself with modifeid independence in nder 10 minutes - casula clothing   Status On-going   OT LONG TERM GOAL #5   Title Patient will demonstrate at least 3 lb increase in left pinch strength to ease independence with ADL   Status On-going               Plan - 12/03/15 1628    Clinical Impression Statement Pt continues to present with decreased problem solving abilities which limits his ability to be more independent with tasks and activities. Pt states he has been ambulating around house without crutch and he reports no falls. Pt states he is having a "slow" day today and that he feels more fatigued today.    Pt will  benefit from skilled therapeutic intervention in order to improve on the following deficits (Retired) Decreased activity tolerance;Decreased balance;Decreased cognition;Decreased coordination;Decreased endurance;Decreased safety awareness;Decreased range of motion;Decreased knowledge of use of DME;Decreased knowledge of precautions;Decreased strength;Impaired perceived functional ability;Impaired flexibility;Improper body mechanics;Impaired vision/preception;Impaired UE functional use;Impaired tone   Rehab Potential Good   OT Frequency 2x / week   OT Duration 8 weeks   OT Treatment/Interventions Self-care/ADL training;Electrical Stimulation;Ultrasound;Fluidtherapy;Therapeutic exercise;Neuromuscular education;Manual Therapy;Functional Mobility Training;DME and/or AE instruction;Energy conservation;Cognitive  remediation/compensation;Visual/perceptual remediation/compensation;Patient/family education;Balance training;Therapeutic exercises;Therapeutic activities   Plan Coordination exercises for LUE/hand, problem solving activity/exercise, putty for L hand    Consulted and Agree with Plan of Care Patient        Problem List Patient Active Problem List   Diagnosis Date Noted  . Gait disturbance, post-stroke 12/02/2015  . 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 Diabetes mellitus type 2 in nonobese (HCC)   . Hypoalbuminemia due to protein-calorie malnutrition ([place]) 10/15/2015  . Poorly controlled type 2 diabetes mellitus with peripheral neuropathy ([place]) 10/14/2015  . History of CVA with residual deficit 10/14/2015  . Hemiparesis affecting dominant side as late effect of stroke ([place]) 10/14/2015  . Depression 10/14/2015  . Basal ganglia infarction ([place]) 10/14/2015  . Acute ischemic stroke ([place])   . Left-sided weakness 10/12/2015  . Abnormal CXR 10/12/2015  . BPH associated with nocturia 08/20/2015  . Aftercare following surgery of the circulatory system, [place] 02/13/2014  . Postop check  02/06/2014  . Left inguinal hernia 12/28/2013  . Rib fractures 10/13/2013  . Fall 10/13/2013  . Dysplastic nevus of trunk 02/14/2013  . Bicipital tendinitis of right shoulder 07/12/2012  . Subacromial bursitis, right 03/28/2012  . Right spastic hemiparesis ([place]) 02/02/2012  . Occlusion and stenosis of carotid artery with cerebral infarction 01/26/2012  . Hemiplegia affecting right dominant side ([place]) 12/24/2011  . CVA (cerebral infarction) 12/15/2011  . Occlusion and stenosis of carotid artery without mention of cerebral infarction 12/08/2011  . Carotid artery plaque 12/04/2011  . Left carotid bruit 11/30/2011  . AUTONOMIC NEUROPATHY, DIABETIC 11/25/2010  . Hyperlipidemia 03/15/2008  . Obstructive sleep apnea 07/27/2007  . Diabetes 1.5, managed as type 2 ([place]) 06/15/2007  . Essential hypertension 06/15/2007    Chrys Racer , MS, OTR/L, CLT Pager: [phone]  12/03/2015, 4:31 PM  [facility] [address] [place], [place], [address] Phone: [phone]   Fax:  [phone]  Name: Maynor Evetts MRN: [id number] Date of Birth: 13-Jul-1946

## 2015-12-03 NOTE — Progress Notes (Signed)
   Subjective:    Patient ID: Darrell [name], male    DOB: [birth year], 71 y.o.   MRN: [id number]  HPI  Darrell [name] is a 70 year old married male nonsmoker who comes in today accompanied by his wife change for evaluation of hypertension  We increased his lisinopril from 10 mg daily to 20 about a month ago. He comes in today for follow-up. Blood pressures in the morning are running [id number] diastolics in the 85 to 0000000 however his midafternoon blood pressures are fairly normal. Today's BP 130/80  We've checked his home device and it's accurate  Review of Systems Review of systems otherwise negative    Objective:   Physical Exam  Well-developed well-nourished male no acute distress vital signs stable he is afebrile BP right arm sitting position 130/80      Assessment & Plan:  Hypertension not at goal with morning blood pressures are elevated........ continue 20 mg in the morning.....[name] at 10 mg at bedtime....... BP check daily... Follow-up in 4 weeks

## 2015-12-03 NOTE — Patient Instructions (Signed)
Lisinopril 20 mg......[name] 1 in the morning with breakfast  Lisinopril 10 mg........[name] 1 weeks your evening meal  Blood pressure check daily in the morning  Blood pressure goal 135/85 or less.......... BP this afternoon 130/80 which is excellent......... if in 4 weeks your blood pressure is at goal then continue your medication and just check your blood pressure weekly  If in 4 weeks your blood pressure is not at goal......... call and leave a voicemail with [name] extension [phone]

## 2015-12-03 NOTE — Progress Notes (Signed)
Pre visit review using our clinic review tool, if applicable. No additional management support is needed unless otherwise documented below in the visit note. 

## 2015-12-04 ENCOUNTER — Ambulatory Visit: Payer: Medicare Other | Admitting: *Deleted

## 2015-12-04 ENCOUNTER — Encounter: Payer: Medicare Other | Admitting: Occupational Therapy

## 2015-12-04 ENCOUNTER — Encounter: Payer: Self-pay | Admitting: *Deleted

## 2015-12-04 DIAGNOSIS — R531 Weakness: Secondary | ICD-10-CM

## 2015-12-04 DIAGNOSIS — R2681 Unsteadiness on feet: Secondary | ICD-10-CM | POA: Diagnosis not present

## 2015-12-04 DIAGNOSIS — I69359 Hemiplegia and hemiparesis following cerebral infarction affecting unspecified side: Secondary | ICD-10-CM | POA: Diagnosis not present

## 2015-12-04 DIAGNOSIS — R2689 Other abnormalities of gait and mobility: Secondary | ICD-10-CM

## 2015-12-04 DIAGNOSIS — G8111 Spastic hemiplegia affecting right dominant side: Secondary | ICD-10-CM

## 2015-12-04 DIAGNOSIS — R6889 Other general symptoms and signs: Secondary | ICD-10-CM | POA: Diagnosis not present

## 2015-12-04 DIAGNOSIS — G8191 Hemiplegia, unspecified affecting right dominant side: Secondary | ICD-10-CM

## 2015-12-04 DIAGNOSIS — R269 Unspecified abnormalities of gait and mobility: Secondary | ICD-10-CM

## 2015-12-04 NOTE — Therapy (Signed)
[facility] [address] [place], [place], [address] Phone: [phone]   Fax:  [phone]  Physical Therapy Treatment  Patient Details  Name: Darrell [name] MRN: [id number] Date of Birth: 1945/11/09 Referring Provider: Alysia Penna, MD  Encounter Date: 12/04/2015      PT End of Session - 12/04/15 1558    Visit Number 7   Number of Visits 17   Date for PT Re-Evaluation 01/10/16   Authorization Type MCR primary-G codes due every 10th visit, [employer] secondary   PT Stop Time [place] During Treatment Gait belt   Activity Tolerance Patient tolerated treatment well   Behavior During Therapy [facility] for tasks assessed/performed      Past Medical History  Diagnosis Date  . Diabetes mellitus   . Hypertension     dr Sherren Mocha  . Sleep apnea     cpap      sleep study > 4 yrs; no longer wearing CPAP  . Stroke [facility])     rt side weakness, 2013  . Poorly controlled type 2 diabetes mellitus with peripheral neuropathy ([place]) 10/14/2015  . History of CVA with residual deficit 10/14/2015    Past Surgical History  Procedure Laterality Date  . Heel spur surgery    . [place] surgery    . Endarterectomy  12/09/2011    Procedure: ENDARTERECTOMY CAROTID;  Surgeon: Tinnie Gens, MD;  Location[facility] OR;  Service: Vascular;  Laterality: Left;  Left Carotid endarterectomy with dacron patch angioplasty  . Carotid endarterectomy  12/09/11    Left  . Hernia repair      x2  . Ankle surgery    . Inguinal hernia repair Left 01/22/2014    Procedure: LEFT INGUINAL HERNIA REPAIR WITH MESH;  Surgeon: [name], MD;  Location: [facility];  Service: General;  Laterality: Left;  . Insertion of mesh Left 01/22/2014    Procedure: INSERTION OF MESH;  Surgeon: [name], MD;  Location: [facility];  Service: General;  Laterality: Left;  . Colonoscopy      There were no vitals filed for this visit.  Visit Diagnosis:  Poor  balance  Abnormality of gait  Generalized weakness  Unsteadiness on feet  Hemiplegia affecting right dominant side (HCC)  Spastic hemiplegia affecting right dominant side (HCC)  Unsteadiness  Hemiparesis affecting nondominant side as late effect of cerebrovascular accident [facility])      Subjective Assessment - 12/04/15 1456    Subjective no new complaints, no changes since last visit.  Reports no pain or falls. Has been walking without crutch at home with spouse near by only without any issues. Would like to walk more outside.   Limitations Walking;House hold activities   Patient Stated Goals "to improve my balance"    Currently in Pain? No/denies            [facility] PT Assessment - 12/04/15 0001    Transfers   Transfers Sit to Stand;Stand to Sit   Sit to Stand 4: Min guard   Transfer Cueing cues for body mechanics   Comments 5x feet together ; 10x right foot in back for strengthening and balance                     [facility] Adult PT Treatment/Exercise - 12/04/15 0001    Ambulation/Gait   Ambulation/Gait Yes   Ambulation/Gait Assistance 4: Min guard;4: Min assist   Ambulation/Gait Assistance Details with changes in head  movement and direction indoors  multiple stops to focus on commands.   Ambulation Distance (Feet) 460 Feet  +100+ 200 outdoor paved surface   Assistive device None   Gait Pattern Step-through pattern;Decreased arm swing - right;Decreased step length - right;Decreased step length - left;Decreased stance time - right;Decreased stride length;Decreased hip/knee flexion - right;Decreased dorsiflexion - right;Trunk flexed;Poor foot clearance - right   Ambulation Surface Level;Indoor;Unlevel;Outdoor   Gait Comments some foot drag with outdoor surfaces   Knee/Hip Exercises: Aerobic   Other Aerobic sci fit stepper L=2 12 min                PT Education - 12/04/15 1556    Education Details instruction for above activities   Person(s) Educated  Patient   Methods Explanation;Demonstration;Tactile cues   Comprehension Verbalized understanding;Returned demonstration          PT Short Term Goals - 12/04/15 1559    PT SHORT TERM GOAL #1   Title Pt will initiate HEP for strengthening and balance in order to decrease balance and improve functional strength.  (Target Date: 12/09/15)   PT SHORT TERM GOAL #2   Title Pt will improve DGI score to 17/24 in order to indicate decreased fall risk and improved functional balance.     PT SHORT TERM GOAL #3   Title Pt will perform 8/10 sit<>stand with single UE support at mod I level in order to indicate improvement in functional strength.    PT SHORT TERM GOAL #4   Title Pt will increase gait speed to 2.60 ft/sec in order to indicate decreased fall risk and improved efficiency of gait.     PT SHORT TERM GOAL #5   Title Pt will ambulate 200' over indoor surfaces without AD in order to indicate safe home negotiation and progression to PLOF.     PT SHORT TERM GOAL #6   Title Will assess 6MWT and improve distance by 150' in order to indicate improvement in functional endurance.     Baseline 661' on 11/26/15 w/ min/guard A           PT Long Term Goals - 12/04/15 1559    PT LONG TERM GOAL #1   Title Pt will be independent with HEP for BLE strengthening and balance to indicate decreased fall risk and improvement in functional mobility.  (Target Date: 01/06/16)   PT LONG TERM GOAL #2   Title Pt will perform 8/10 sit<>stand without UE support at mod I level to indicate improvement in functional strength.    PT LONG TERM GOAL #3   Title Pt will ambulate >1000' over varying community surfaces (including grass, curb, and ramp) at mod I level with use of L forearm crutch to indicate safe return to community at [facility].     PT LONG TERM GOAL #4   Title Pt will be able to ambulate around neighborhood "loop" (.6 mile) with L forearm crutch at mod I level to indicate improvement in functional endurance and  return to leisure activity.     PT LONG TERM GOAL #5   Title Pt will increase DGI >19 points in order to indicate decreased fall risk.    PT LONG TERM GOAL #6   Title Pt will increase SIS-mobility to 48.4% in order to indicate self reported improvement in mobility.                 Plan - 12/04/15 1458    Clinical Impression Statement skiled session focused on LE  strengthening and stretching, dynamic gait without device, and functional balance.  Pt reports working on HEP at home and is progressing towards goals.   Pt will benefit from skilled therapeutic intervention in order to improve on the following deficits Abnormal gait;Decreased activity tolerance;Decreased balance;Decreased cognition;Decreased endurance;Decreased knowledge of precautions;Decreased scar mobility;Decreased strength;Impaired perceived functional ability;Impaired flexibility;Impaired tone;Impaired UE functional use;Improper body mechanics;Postural dysfunction   PT Treatment/Interventions ADLs/Self Care Home Management;Electrical Stimulation;DME Instruction;Gait training;Stair training;Functional mobility training;Therapeutic activities;Therapeutic exercise;Balance training;Neuromuscular re-education;Cognitive remediation;Patient/family education;Orthotic Fit/Training;Manual techniques;Vestibular   PT Next Visit Plan  work on gait without AD for indoor negotiation, balance and strengthening         Problem List Patient Active Problem List   Diagnosis Date Noted  . Gait disturbance, post-stroke 12/02/2015  . 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 Diabetes mellitus type 2 in nonobese (HCC)   . Hypoalbuminemia due to protein-calorie malnutrition ([place]) 10/15/2015  . Poorly controlled type 2 diabetes mellitus with peripheral neuropathy ([place]) 10/14/2015  . History of CVA with residual deficit 10/14/2015  . Hemiparesis affecting dominant side as late effect of stroke ([place]) 10/14/2015  . Depression 10/14/2015  .  Basal ganglia infarction ([place]) 10/14/2015  . Acute ischemic stroke ([place])   . Left-sided weakness 10/12/2015  . Abnormal CXR 10/12/2015  . BPH associated with nocturia 08/20/2015  . Aftercare following surgery of the circulatory system, [place] 02/13/2014  . Postop check 02/06/2014  . Left inguinal hernia 12/28/2013  . Rib fractures 10/13/2013  . Fall 10/13/2013  . Dysplastic nevus of trunk 02/14/2013  . Bicipital tendinitis of right shoulder 07/12/2012  . Subacromial bursitis, right 03/28/2012  . Right spastic hemiparesis ([place]) 02/02/2012  . Occlusion and stenosis of carotid artery with cerebral infarction 01/26/2012  . Hemiplegia affecting right dominant side ([place]) 12/24/2011  . CVA (cerebral infarction) 12/15/2011  . Occlusion and stenosis of carotid artery without mention of cerebral infarction 12/08/2011  . Carotid artery plaque 12/04/2011  . Left carotid bruit 11/30/2011  . AUTONOMIC NEUROPATHY, DIABETIC 11/25/2010  . Hyperlipidemia 03/15/2008  . Obstructive sleep apnea 07/27/2007  . Diabetes 1.5, managed as type 2 ([place]) 06/15/2007  . Essential hypertension 06/15/2007    Bjorn Loser, PTA  12/04/2015, 4:04 PM [facility] [address] [place], [place], [address] Phone: [phone]   Fax:  [phone]  Name: Darrell [name] MRN: [id number] Date of Birth: 09-12-1946

## 2015-12-05 ENCOUNTER — Ambulatory Visit: Payer: Medicare Other | Admitting: Occupational Therapy

## 2015-12-05 ENCOUNTER — Encounter: Payer: Self-pay | Admitting: Occupational Therapy

## 2015-12-05 DIAGNOSIS — I69359 Hemiplegia and hemiparesis following cerebral infarction affecting unspecified side: Secondary | ICD-10-CM | POA: Diagnosis not present

## 2015-12-05 DIAGNOSIS — R2681 Unsteadiness on feet: Secondary | ICD-10-CM | POA: Diagnosis not present

## 2015-12-05 DIAGNOSIS — R269 Unspecified abnormalities of gait and mobility: Secondary | ICD-10-CM | POA: Diagnosis not present

## 2015-12-05 DIAGNOSIS — R531 Weakness: Secondary | ICD-10-CM | POA: Diagnosis not present

## 2015-12-05 DIAGNOSIS — R2689 Other abnormalities of gait and mobility: Secondary | ICD-10-CM | POA: Diagnosis not present

## 2015-12-05 DIAGNOSIS — F0789 Other personality and behavioral disorders due to known physiological condition: Secondary | ICD-10-CM

## 2015-12-05 DIAGNOSIS — R6889 Other general symptoms and signs: Secondary | ICD-10-CM | POA: Diagnosis not present

## 2015-12-05 DIAGNOSIS — F09 Unspecified mental disorder due to known physiological condition: Secondary | ICD-10-CM

## 2015-12-05 NOTE — Therapy (Signed)
[facility] [address] [place], [place], [address] Phone: [phone]   Fax:  [phone]  Occupational Therapy Treatment  Patient Details  Name: Darrell [name] MRN: [id number] Date of Birth: October 21, 1946 Referring Provider: Dr Letta Pate  Encounter Date: 12/05/2015      OT End of Session - 12/05/15 1723    Visit Number 7   Number of Visits 17   Date for OT Re-Evaluation 01/10/16   Authorization Type Medicare - g code and progress note visit 10   Authorization - Visit Number 7   Authorization - Number of Visits 10   OT Start Time 2694   OT Stop Time 1614   OT Time Calculation (min) 43 min   Activity Tolerance Patient tolerated treatment well      Past Medical History  Diagnosis Date  . Diabetes mellitus   . Hypertension     dr Sherren Mocha  . Sleep apnea     cpap      sleep study > 4 yrs; no longer wearing CPAP  . Stroke [facility])     rt side weakness, 2013  . Poorly controlled type 2 diabetes mellitus with peripheral neuropathy ([place]) 10/14/2015  . History of CVA with residual deficit 10/14/2015    Past Surgical History  Procedure Laterality Date  . Heel spur surgery    . [place] surgery    . Endarterectomy  12/09/2011    Procedure: ENDARTERECTOMY CAROTID;  Surgeon: Tinnie Gens, MD;  Location: [facility] OR;  Service: Vascular;  Laterality: Left;  Left Carotid endarterectomy with dacron patch angioplasty  . Carotid endarterectomy  12/09/11    Left  . Hernia repair      x2  . Ankle surgery    . Inguinal hernia repair Left 01/22/2014    Procedure: LEFT INGUINAL HERNIA REPAIR WITH MESH;  Surgeon: [name], MD;  Location: [facility];  Service: General;  Laterality: Left;  . Insertion of mesh Left 01/22/2014    Procedure: INSERTION OF MESH;  Surgeon: [name], MD;  Location: [place];  Service: General;  Laterality: Left;  . Colonoscopy      There were no vitals filed for this visit.  Visit Diagnosis:  Hemiparesis  affecting nondominant side as late effect of cerebrovascular accident ([place])  Cognitive and neurobehavioral dysfunction  Decreased activity tolerance      Subjective Assessment - 12/05/15 1539    Subjective  They added more BP medicine because my morning BP is still too high   Pertinent History see epic   Patient Stated Goals just don't have me lay on my back for therapy   Currently in Pain? No/denies                      OT Treatments/Exercises (OP) - 12/05/15 0001    Neurological Re-education Exercises   Other Exercises 1 Neuro re ed to address coordination and strength in LUE and provide pt with simple HEP - issued theraputty after several other therapeutic activities with putty. Pt needed max cues and max repetition for two activities with yellow putty for home use. Progam given in writing and also reviewed with wife who will assist pt  at home.  Several attempts to engage RUE into functional self care tasks including donning and doffing jacket, zipping jacket using both hands, open containers using R hand to hold and left hand to open - pt needs max cues to incoporate both hands and often states "  why I am doing this?"  Reinforced that pt has the ability to use both hands for tasks and goal is to use both sides of the body for function.                 OT Education - 12/05/15 1716    Education provided Yes   Education Details simple theraputty with green putty    Person(s) Educated Patient;Spouse   Methods Explanation;Demonstration;Tactile cues;Verbal cues;Handout   Comprehension Verbalized understanding  wife verbalized undestanding and will assist pt at home          OT Short Term Goals - 12/05/15 1717    OT SHORT TERM GOAL #1   Title Patient will complete home activity program with min cueing - 12/09/2015   Status Partially Met  needs max cues   OT SHORT TERM GOAL #2   Title Patient will don front opening shirt with no more than set up assistance    Status Achieved   OT SHORT TERM GOAL #3   Title Patient will step over lip of shower stall with no greater than supervision   Status Achieved  with grab bar   OT SHORT TERM GOAL #4   Title Patient will complete all aspects of self bathing while in the shower with modified independence   Status Achieved  in sitting           OT Long Term Goals - 12/05/15 1719    OT LONG TERM GOAL #1   Title Patient will complete home activity / home exercise program independently 01/06/2016   Status On-going   OT LONG TERM GOAL #2   Title Patient will shower with modifeid independence   Status On-going   OT LONG TERM GOAL #3   Title Patient will complete 15-20  minutes of ADL/IADL without a rest break, includes sitting, standing, and walking, e.g. getting dressed and making the bed   Status On-going   OT LONG TERM GOAL #4   Title Patient will dress himself with modifeid independence in nder 10 minutes - cas[place] clothing   Status On-going   OT LONG TERM GOAL #5   Title Patient will demonstrate at least 3 lb increase in left pinch strength to ease independence with ADL   Status On-going               Plan - 12/05/15 1720    Clinical Impression Statement Pt progressing toward goals especially in the areas of functional mobility and basic self care. Pt with severe cogntive deficits that impact use of UE's    Pt will benefit from skilled therapeutic intervention in order to improve on the following deficits (Retired) Decreased activity tolerance;Decreased balance;Decreased cognition;Decreased coordination;Decreased endurance;Decreased safety awareness;Decreased range of motion;Decreased knowledge of use of DME;Decreased knowledge of precautions;Decreased strength;Impaired perceived functional ability;Impaired flexibility;Improper body mechanics;Impaired vision/preception;Impaired UE functional use;Impaired tone   Rehab Potential Good   OT Frequency 2x / week   OT Duration 8 weeks   OT  Treatment/Interventions Self-care/ADL training;Electrical Stimulation;Ultrasound;Fluidtherapy;Therapeutic exercise;Neuromuscular education;Manual Therapy;Functional Mobility Training;DME and/or AE instruction;Energy conservation;Cognitive remediation/compensation;Visual/perceptual remediation/compensation;Patient/family education;Balance training;Therapeutic exercises;Therapeutic activities   Plan coordination of L hand, functional tasks to address bilateral UE use, balance and functional mobility   OT Home Exercise Plan Initiated home activity program   Consulted and Agree with Plan of Care Patient   Family Member Consulted wife Erskine Squibb        Problem List Patient Active Problem List   Diagnosis Date Noted  . Gait disturbance, post-stroke  12/02/2015  . 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 Diabetes mellitus type 2 in nonobese (HCC)   . Hypoalbuminemia due to protein-calorie malnutrition ([place]) 10/15/2015  . Poorly controlled type 2 diabetes mellitus with peripheral neuropathy ([place]) 10/14/2015  . History of CVA with residual deficit 10/14/2015  . Hemiparesis affecting dominant side as late effect of stroke ([place]) 10/14/2015  . Depression 10/14/2015  . Basal ganglia infarction ([place]) 10/14/2015  . Acute ischemic stroke ([place])   . Left-sided weakness 10/12/2015  . Abnormal CXR 10/12/2015  . BPH associated with nocturia 08/20/2015  . Aftercare following surgery of the circulatory system, [place] 02/13/2014  . Postop check 02/06/2014  . Left inguinal hernia 12/28/2013  . Rib fractures 10/13/2013  . Fall 10/13/2013  . Dysplastic nevus of trunk 02/14/2013  . Bicipital tendinitis of right shoulder 07/12/2012  . Subacromial bursitis, right 03/28/2012  . Right spastic hemiparesis ([place]) 02/02/2012  . Occlusion and stenosis of carotid artery with cerebral infarction 01/26/2012  . Hemiplegia affecting right dominant side ([place]) 12/24/2011  . CVA (cerebral infarction) 12/15/2011  . Occlusion and  stenosis of carotid artery without mention of cerebral infarction 12/08/2011  . Carotid artery plaque 12/04/2011  . Left carotid bruit 11/30/2011  . AUTONOMIC NEUROPATHY, DIABETIC 11/25/2010  . Hyperlipidemia 03/15/2008  . Obstructive sleep apnea 07/27/2007  . Diabetes 1.5, managed as type 2 ([place]) 06/15/2007  . Essential hypertension 06/15/2007    Quay Burow, OTR/L 12/05/2015, 5:25 PM  [facility] [address] [place], [place], [address] Phone: [phone]   Fax:  [phone]  Name: Darrell [name] MRN: [id number] Date of Birth: 1946/09/17

## 2015-12-05 NOTE — Patient Instructions (Addendum)
1. Grip Strengthening (Resistive Putty)  ROLL INTO SHORT FAT HOT DOG THEN   Squeeze putty using thumb and all fingers. Repeat _15__ times. Do __2__ sessions per day.  MAKE A HOT DOG EACH TIME THEN SQUEEZE. MAKE YOUR HOT DOG EACH TIME.   2. Roll putty into tube on table and pinch between each finger and thumb x 10 reps each. (can do ring and small finger together)     Copyright  VHI. All rights reserved.

## 2015-12-06 ENCOUNTER — Encounter: Payer: Medicare Other | Admitting: Occupational Therapy

## 2015-12-06 ENCOUNTER — Telehealth: Payer: Self-pay | Admitting: Rehabilitation

## 2015-12-06 ENCOUNTER — Encounter: Payer: Self-pay | Admitting: Rehabilitation

## 2015-12-06 ENCOUNTER — Ambulatory Visit: Payer: Medicare Other | Admitting: Rehabilitation

## 2015-12-06 DIAGNOSIS — R6889 Other general symptoms and signs: Secondary | ICD-10-CM

## 2015-12-06 DIAGNOSIS — R531 Weakness: Secondary | ICD-10-CM | POA: Diagnosis not present

## 2015-12-06 DIAGNOSIS — R2689 Other abnormalities of gait and mobility: Secondary | ICD-10-CM | POA: Diagnosis not present

## 2015-12-06 DIAGNOSIS — I69359 Hemiplegia and hemiparesis following cerebral infarction affecting unspecified side: Secondary | ICD-10-CM | POA: Diagnosis not present

## 2015-12-06 DIAGNOSIS — R2681 Unsteadiness on feet: Secondary | ICD-10-CM

## 2015-12-06 DIAGNOSIS — M21371 Foot drop, right foot: Secondary | ICD-10-CM

## 2015-12-06 DIAGNOSIS — R269 Unspecified abnormalities of gait and mobility: Secondary | ICD-10-CM | POA: Diagnosis not present

## 2015-12-06 NOTE — Telephone Encounter (Signed)
Dr. Letta Pate,   I am seeing [name] here at [facility] for PT.  Feel that one of his biggest barriers for safety and balance is that he continuously catches his R foot during gait and mobility.  Have trialed a few AFO's during therapy and feel he would benefit from brace to decrease fall risk and increase independence.  Please place order if you agree.    Thanks,  Cameron Sprang, PT, MPT [facility] [address] [place], [place], [address] Phone: [phone]   Fax:  [phone] 12/06/2015, 5:15 PM

## 2015-12-06 NOTE — Therapy (Signed)
[facility] [address] [place], [place], [address] Phone: [phone]   Fax:  [phone]  Physical Therapy Treatment  Patient Details  Name: Darrell [name] MRN: [id number] Date of Birth: [birth year] Referring Provider: Alysia Penna, MD  Encounter Date: 12/06/2015      PT End of Session - 12/06/15 1553    Visit Number 8   Number of Visits 17   Date for PT Re-Evaluation 01/10/16   Authorization Type MCR primary-G codes due every 10th visit, [employer] secondary   PT Start Time 1532   PT Stop Time 1617   PT Time Calculation (min) 45 min   Equipment Utilized During Treatment Gait belt   Activity Tolerance Patient tolerated treatment well   Behavior During Therapy [facility] for tasks assessed/performed      Past Medical History  Diagnosis Date  . Diabetes mellitus   . Hypertension     dr Sherren Mocha  . Sleep apnea     cpap      sleep study > 4 yrs; no longer wearing CPAP  . Stroke [facility])     rt side weakness, 2013  . Poorly controlled type 2 diabetes mellitus with peripheral neuropathy ([place]) 10/14/2015  . History of CVA with residual deficit 10/14/2015    Past Surgical History  Procedure Laterality Date  . Heel spur surgery    . [place] surgery    . Endarterectomy  12/09/2011    Procedure: ENDARTERECTOMY CAROTID;  Surgeon: Tinnie Gens, MD;  Location: [facility] OR;  Service: Vascular;  Laterality: Left;  Left Carotid endarterectomy with dacron patch angioplasty  . Carotid endarterectomy  12/09/11    Left  . Hernia repair      x2  . Ankle surgery    . Inguinal hernia repair Left 01/22/2014    Procedure: LEFT INGUINAL HERNIA REPAIR WITH MESH;  Surgeon: [name], MD;  Location: [facility];  Service: General;  Laterality: Left;  . Insertion of mesh Left 01/22/2014    Procedure: INSERTION OF MESH;  Surgeon: [name], MD;  Location: [facility];  Service: General;  Laterality: Left;  . Colonoscopy      There were no vitals filed  for this visit.  Visit Diagnosis:  Abnormality of gait  Decreased activity tolerance  Poor balance  Generalized weakness  Unsteadiness      Subjective Assessment - 12/06/15 1552    Subjective No new complaints, still walking at home without cane but with wife providing S.   Limitations Walking;House hold activities   Patient Stated Goals "to improve my balance"    Currently in Pain? No/denies              TE:  Went over HEP to address STG (see pt instruction for details on exercises and reps).  Note pt able to perform most exercises with use of handout, however did require min cues for 2-3 exercises, esp heel walking as he continued to have trouble during session sequencing and following commands.  Also addressed sit<>stand goal.  Pt able to meet goal and surpass goal by performing without UE support during session.    Orthotic check: Assessed use of foot up brace and Walk on [place] PLS AFO during session for improved R foot clearance during gait.  Note that this seems to be pts larger issue and deficit relating to balance and safety.  Donned foot up brace initially and ambulated x 345' at S level without use of crutch to better  fatigue pt and assess use of brace.  Note improvement, however continues to demonstrate decreased R hip and knee flexion to fully clear RLE.  Then donned R PLS AFO (only had small/medium at time of assessment) and ambulated another 230' as above.  Note marked improvement in foot clearance and improved R hip and knee flex.  Pt states brace is comfortable and seems to improve attention to LE.  Will send order request to MD for brace order.    Self Care:  Educated pt and wife on beginning to walk in neighborhood with crutch but at S level to increase walking endurance.  Both verbalized understanding.,                   PT Education - 12/06/15 1552    Education provided Yes   Education Details education on use of R AFO, education on beginning  to walk outdoors on paved surfaces with crutch and S from wife.     Person(s) Educated Patient;Spouse   Methods Explanation   Comprehension Verbalized understanding          PT Short Term Goals - 12/06/15 1553    PT SHORT TERM GOAL #1   Title Pt will initiate HEP for strengthening and balance in order to decrease balance and improve functional strength.  (Target Date: 12/09/15)   Baseline met 12/06/15    Status Achieved   PT SHORT TERM GOAL #2   Title Pt will improve DGI score to 17/24 in order to indicate decreased fall risk and improved functional balance.     PT SHORT TERM GOAL #3   Title Pt will perform 8/10 sit<>stand with single UE support at mod I level in order to indicate improvement in functional strength.    Baseline Performed 10 reps without UE support on 12/06/15   Status Achieved   PT SHORT TERM GOAL #4   Title Pt will increase gait speed to 2.60 ft/sec in order to indicate decreased fall risk and improved efficiency of gait.     PT SHORT TERM GOAL #5   Title Pt will ambulate 200' over indoor surfaces without AD in order to indicate safe home negotiation and progression to PLOF.     PT SHORT TERM GOAL #6   Title Will assess and improve distance by 150' in order to indicate improvement in functional endurance.     Baseline 661' on 11/26/15 w/ min/guard A           PT Long Term Goals - 12/04/15 1559    PT LONG TERM GOAL #1   Title Pt will be independent with HEP for BLE strengthening and balance to indicate decreased fall risk and improvement in functional mobility.  (Target Date: 01/06/16)   PT LONG TERM GOAL #2   Title Pt will perform 8/10 sit<>stand without UE support at mod I level to indicate improvement in functional strength.    PT LONG TERM GOAL #3   Title Pt will ambulate >1000' over varying community surfaces (including grass, curb, and ramp) at mod I level with use of L forearm crutch to indicate safe return to community at [name].     PT LONG TERM GOAL #4    Title Pt will be able to ambulate around neighborhood "loop" (.6 mile) with L forearm crutch at mod I level to indicate improvement in functional endurance and return to leisure activity.     PT LONG TERM GOAL #5   Title Pt will increase DGI >19  points in order to indicate decreased fall risk.    PT LONG TERM GOAL #6   Title Pt will increase SIS-mobility to 48.4% in order to indicate self reported improvement in mobility.                 Plan - 12/06/15 1553    Clinical Impression Statement Skilled session focused on assessment of bracing for RLE to prevent toe drag, prevent falls, increase independence.  Note that R PLS AFO (walk on [place]) seemed to work the best with improved attention to RLE during gait as well for improved isolated hip and knee flexion.  Also began to check STG;s.  Pt has met 2 STGs so far, educated pt on progress.     Pt will benefit from skilled therapeutic intervention in order to improve on the following deficits Abnormal gait;Decreased activity tolerance;Decreased balance;Decreased cognition;Decreased endurance;Decreased knowledge of precautions;Decreased scar mobility;Decreased strength;Impaired perceived functional ability;Impaired flexibility;Impaired tone;Impaired UE functional use;Improper body mechanics;Postural dysfunction   PT Treatment/Interventions ADLs/Self Care Home Management;Electrical Stimulation;DME Instruction;Gait training;Stair training;Functional mobility training;Therapeutic activities;Therapeutic exercise;Balance training;Neuromuscular re-education;Cognitive remediation;Patient/family education;Orthotic Fit/Training;Manual techniques;Vestibular   PT Next Visit Plan  work on gait without AD for indoor negotiation, balance and strengthening    Recommended Other Services PT to send order request for R AFO   Consulted and Agree with Plan of Care Patient;Family member/caregiver   Family Member Consulted wife        Problem List Patient  Active Problem List   Diagnosis Date Noted  . Gait disturbance, post-stroke 12/02/2015  . 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 Diabetes mellitus type 2 in nonobese (HCC)   . Hypoalbuminemia due to protein-calorie malnutrition ([place]) 10/15/2015  . Poorly controlled type 2 diabetes mellitus with peripheral neuropathy ([place]) 10/14/2015  . History of CVA with residual deficit 10/14/2015  . Hemiparesis affecting dominant side as late effect of stroke ([place]) 10/14/2015  . Depression 10/14/2015  . Basal ganglia infarction ([place]) 10/14/2015  . Acute ischemic stroke ([place])   . Left-sided weakness 10/12/2015  . Abnormal CXR 10/12/2015  . BPH associated with nocturia 08/20/2015  . Aftercare following surgery of the circulatory system, [place] 02/13/2014  . Postop check 02/06/2014  . Left inguinal hernia 12/28/2013  . Rib fractures 10/13/2013  . Fall 10/13/2013  . Dysplastic nevus of trunk 02/14/2013  . Bicipital tendinitis of right shoulder 07/12/2012  . Subacromial bursitis, right 03/28/2012  . Right spastic hemiparesis ([place]) 02/02/2012  . Occlusion and stenosis of carotid artery with cerebral infarction 01/26/2012  . Hemiplegia affecting right dominant side ([place]) 12/24/2011  . CVA (cerebral infarction) 12/15/2011  . Occlusion and stenosis of carotid artery without mention of cerebral infarction 12/08/2011  . Carotid artery plaque 12/04/2011  . Left carotid bruit 11/30/2011  . AUTONOMIC NEUROPATHY, DIABETIC 11/25/2010  . Hyperlipidemia 03/15/2008  . Obstructive sleep apnea 07/27/2007  . Diabetes 1.5, managed as type 2 ([place]) 06/15/2007  . Essential hypertension 06/15/2007    Cameron Sprang, PT, MPT [facility] [address] [place], [place], [address] Phone: [phone]   Fax:  [phone] 12/06/2015, 5:06 PM  Name: Darrell [name] MRN: [id number] Date of Birth: 1946-08-02

## 2015-12-06 NOTE — Patient Instructions (Signed)
Functional Quadriceps: Sit to Stand    Sit on edge of chair, feet flat on floor. Stand upright, extending knees fully. Repeat __10__ times per set. Do _1-2__ sets per session. Do __1-2__ sessions per day.  http://orth.exer.us/734    Copyright  VHI. All rights reserved.  Hip Abduction (Standing)    Stand with support on counter top. Lift right leg out to side, keeping toe forward. Hold for _3__ seconds and then slowly bring leg back to floor. Repeat with left leg, 3 sec hold.  Repeat _10__ times each leg. Do _1-2__ times a day.  Copyright  VHI. All rights reserved.  EXTENSION: Standing (Active)    At counter top: Stand, both feet flat. Bring right leg backwards, keeping knee as straight as possible. Do not lean forward. Repeat with left leg. . Complete 10 reps each leg. 1-2 times a day.  http://gtsc.exer.us/76   Copyright  VHI. All rights reserved.  Toe / Heel Raise (Standing)    Standing with support, raise heels, then rock back on heels and raise toes. Repeat __10__ times. 1-2 times a day.  Copyright  VHI. All rights reserved.    "I love a Database administrator   At counter for balance as needed: high knee marching forward and then backward. 3 second pauses with each knee lift.  Repeat 3 laps each way. Do _1-2_ sessions per day. http://gt2.exer.us/345   Copyright  VHI. All rights reserved.  Side-Stepping   At counter for balance as needed: Walk to left side with eyes open. Take even steps, leading with same foot. Make sure each foot lifts off the floor. Repeat in opposite direction. Keep feet pointed toward counter. Repeat for 3 laps each way. Do _1-2__ sessions per day.  Copyright  VHI. All rights reserved.  Walking on Heels   At counter: Walk on heels forward while continuing on a straight path, and then walk on heels backward to starting position. Repeat for 3 laps each way. Do _1-2__ sessions per day.   Copyright  VHI. All rights reserved.   Feet Heel-Toe "Tandem"   At counter: Arms at sides, walk a straight line forward bringing one foot directly in front of the other, and then a straight line backwards bringing one foot directly behind the other one.  Repeat for _3 laps each way. Do _1-2_ sessions per day.

## 2015-12-09 ENCOUNTER — Ambulatory Visit: Payer: Medicare Other | Admitting: Rehabilitation

## 2015-12-09 ENCOUNTER — Ambulatory Visit: Payer: Medicare Other | Admitting: Occupational Therapy

## 2015-12-09 DIAGNOSIS — R269 Unspecified abnormalities of gait and mobility: Secondary | ICD-10-CM | POA: Diagnosis not present

## 2015-12-09 DIAGNOSIS — R531 Weakness: Secondary | ICD-10-CM | POA: Diagnosis not present

## 2015-12-09 DIAGNOSIS — R2681 Unsteadiness on feet: Secondary | ICD-10-CM | POA: Diagnosis not present

## 2015-12-09 DIAGNOSIS — I69359 Hemiplegia and hemiparesis following cerebral infarction affecting unspecified side: Secondary | ICD-10-CM | POA: Diagnosis not present

## 2015-12-09 DIAGNOSIS — R6889 Other general symptoms and signs: Secondary | ICD-10-CM | POA: Diagnosis not present

## 2015-12-09 DIAGNOSIS — M21371 Foot drop, right foot: Secondary | ICD-10-CM

## 2015-12-09 DIAGNOSIS — R2689 Other abnormalities of gait and mobility: Secondary | ICD-10-CM

## 2015-12-09 NOTE — Addendum Note (Signed)
Addended by: Charlett Blake on: 12/09/2015 07:41 AM   Modules accepted: Orders

## 2015-12-10 ENCOUNTER — Encounter: Payer: Self-pay | Admitting: Rehabilitation

## 2015-12-10 NOTE — Therapy (Signed)
[facility] [address] [place], [place], [address] Phone: [phone]   Fax:  [phone]  Physical Therapy Treatment  Patient Details  Name: Darrell [name] MRN: [id number] Date of Birth: [birth year] Referring Provider: Alysia Penna, MD  Encounter Date: 12/09/2015      PT End of Session - 12/10/15 0851    Visit Number 9   Number of Visits 17   Date for PT Re-Evaluation 01/10/16   Authorization Type MCR primary-G codes due every 10th visit, [employer] secondary   PT Start Time 1447   PT Stop Time 1530   PT Time Calculation (min) 43 min   Equipment Utilized During Treatment Gait belt   Activity Tolerance Patient tolerated treatment well   Behavior During Therapy [facility] for tasks assessed/performed      Past Medical History  Diagnosis Date  . Diabetes mellitus   . Hypertension     dr Sherren Mocha  . Sleep apnea     cpap      sleep study > 4 yrs; no longer wearing CPAP  . Stroke [facility])     rt side weakness, 2013  . Poorly controlled type 2 diabetes mellitus with peripheral neuropathy ([place]) 10/14/2015  . History of CVA with residual deficit 10/14/2015    Past Surgical History  Procedure Laterality Date  . Heel spur surgery    . [place] surgery    . Endarterectomy  12/09/2011    Procedure: ENDARTERECTOMY CAROTID;  Surgeon: Tinnie Gens, MD;  Location: [facility];  Service: Vascular;  Laterality: Left;  Left Carotid endarterectomy with dacron patch angioplasty  . Carotid endarterectomy  12/09/11    Left  . Hernia repair      x2  . Ankle surgery    . Inguinal hernia repair Left 01/22/2014    Procedure: LEFT INGUINAL HERNIA REPAIR WITH MESH;  Surgeon: [name], MD;  Location: [facility];  Service: General;  Laterality: Left;  . Insertion of mesh Left 01/22/2014    Procedure: INSERTION OF MESH;  Surgeon: [name], MD;  Location: [facility];  Service: General;  Laterality: Left;  . Colonoscopy      There were no vitals filed  for this visit.  Visit Diagnosis:  Abnormality of gait  Unsteadiness  Poor balance  Right foot drop      Subjective Assessment - 12/10/15 0847    Subjective Reports that he walked for 15 mins over the weekend with his wife outdoors.  Also reported going to an event in [place] and had difficulty negotiating stairs, "I froze, I'm not sure what happened, but people helped me."    Limitations Walking;House hold activities   Patient Stated Goals "to improve my balance"    Currently in Pain? No/denies        Ortho Check:  Gerald Stabs from [place] present during session to assess need for R AFO to prevent R foot drag, improve balance and decrease fall risk.  Had pt ambulate 400' over indoor surfaces without AD in order to produce element of fatigue to better assess R foot drag.  Note that he tended to clear RLE fairly well, however note that shoe does clear easier on indoor surfaces.  PT concerned about pt ambulating over outdoor surfaces as this will tend to increase difficulty of R foot clearance.  Donned R walk on [place] AFO for gait over outdoor surfaces >1000' at S to min/guard (over grass) to better assess brace and foot clearance.  Note  that he intermittently drags toe but more often than not tends to catch heel, indicative of adequate DF assist.  Order provided and Gerald Stabs to deliver on 3/1.  Pt verbalized understanding.  Self care:  Discussion on getting shoe button in order to better secure brace in shoe with regular shoe lace.  Will speak with wife regarding this, however she was not present during session.    NMR:  Performed DGI to assess STG.  Note that score continues to be 13/24, indicative of high fall risk.  Discussed results with pt, however he has improved gait speed and met that goal.  See below.                   [facility] Adult PT Treatment/Exercise - 12/09/15 1529    Standardized Balance Assessment   Standardized Balance Assessment Dynamic Gait Index   Dynamic Gait  Index   Level Surface Mild Impairment   Change in Gait Speed Moderate Impairment   Gait with Horizontal Head Turns Moderate Impairment   Gait with Vertical Head Turns Mild Impairment   Gait and Pivot Turn Mild Impairment   Step Over Obstacle Mild Impairment   Step Around Obstacles Mild Impairment   Steps Moderate Impairment   Total Score 13                PT Education - 12/10/15 0850    Education provided Yes   Education Details Education on varying AFO's with use of shoe button with regular laces to ensure proper fit of brace.    Person(s) Educated Patient   Methods Explanation   Comprehension Verbalized understanding          PT Short Term Goals - 12/09/15 1520    PT SHORT TERM GOAL #1   Title Pt will initiate HEP for strengthening and balance in order to decrease balance and improve functional strength.  (Target Date: 12/09/15)   Baseline met 12/06/15    Status Achieved   PT SHORT TERM GOAL #2   Title Pt will improve DGI score to 17/24 in order to indicate decreased fall risk and improved functional balance.     Baseline 13/24 on 12/09/15   Status Not Met   PT SHORT TERM GOAL #3   Title Pt will perform 8/10 sit<>stand with single UE support at mod I level in order to indicate improvement in functional strength.    Baseline Performed 10 reps without UE support on 12/06/15   Status Achieved   PT SHORT TERM GOAL #4   Title Pt will increase gait speed to 2.60 ft/sec in order to indicate decreased fall risk and improved efficiency of gait.     Baseline 2.74 ft/sec on 12/09/15   Status Achieved   PT SHORT TERM GOAL #5   Title Pt will ambulate 200' over indoor surfaces without AD in order to indicate safe home negotiation and progression to PLOF.     Baseline met 12/09/15   Status Achieved   PT SHORT TERM GOAL #6   Title Will assess 6MWT and improve distance by 150' in order to indicate improvement in functional endurance.     Baseline 661' on 11/26/15 w/ min/guard A            PT Long Term Goals - 12/04/15 1559    PT LONG TERM GOAL #1   Title Pt will be independent with HEP for BLE strengthening and balance to indicate decreased fall risk and improvement in functional mobility.  (Target Date:  01/06/16)   PT LONG TERM GOAL #2   Title Pt will perform 8/10 sit<>stand without UE support at mod I level to indicate improvement in functional strength.    PT LONG TERM GOAL #3   Title Pt will ambulate >1000' over varying community surfaces (including grass, curb, and ramp) at mod I level with use of L forearm crutch to indicate safe return to community at [facility].     PT LONG TERM GOAL #4   Title Pt will be able to ambulate around neighborhood "loop" (.6 mile) with L forearm crutch at mod I level to indicate improvement in functional endurance and return to leisure activity.     PT LONG TERM GOAL #5   Title Pt will increase DGI >19 points in order to indicate decreased fall risk.    PT LONG TERM GOAL #6   Title Pt will increase SIS-mobility to 48.4% in order to indicate self reported improvement in mobility.                 Plan - 12/10/15 4580    Clinical Impression Statement Skilled session focused on assessment for need for R AFO to prevent toe drag during gait.  Chris from [facility] present to better assess proper brace.  Had pt ambulate outdoors and indoors during session and feel that walk on [place] AFO best suited for pt to clear RLE during gait.  Also addressed remaining STG's.  Pt met gait speed goal however is still scoring 13/24 on DGI, and continues to be increased fall risk.     Pt will benefit from skilled therapeutic intervention in order to improve on the following deficits Abnormal gait;Decreased activity tolerance;Decreased balance;Decreased cognition;Decreased endurance;Decreased knowledge of precautions;Decreased scar mobility;Decreased strength;Impaired perceived functional ability;Impaired flexibility;Impaired tone;Impaired UE functional  use;Improper body mechanics;Postural dysfunction   Rehab Potential Good   Clinical Impairments Affecting Rehab Potential co-morbidities, prior CVA   PT Frequency 2x / week   PT Duration 8 weeks   PT Treatment/Interventions ADLs/Self Care Home Management;Electrical Stimulation;DME Instruction;Gait training;Stair training;Functional mobility training;Therapeutic activities;Therapeutic exercise;Balance training;Neuromuscular re-education;Cognitive remediation;Patient/family education;Orthotic Fit/Training;Manual techniques;Vestibular   PT Next Visit Plan  G CODE!!!  (Did DGI last visit, so can use that) work on gait without AD for indoor negotiation, balance and strengthening, repeat 6MWT (done PLS AFO to see if this increases distance)    Recommended Other Services AFO to be delivered 3/1   Consulted and Agree with Plan of Care Patient;Family member/caregiver   Family Member Consulted wife        Problem List Patient Active Problem List   Diagnosis Date Noted  . Gait disturbance, post-stroke 12/02/2015  . 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 Diabetes mellitus type 2 in nonobese (HCC)   . Hypoalbuminemia due to protein-calorie malnutrition ([place]) 10/15/2015  . Poorly controlled type 2 diabetes mellitus with peripheral neuropathy ([place]) 10/14/2015  . History of CVA with residual deficit 10/14/2015  . Hemiparesis affecting dominant side as late effect of stroke ([place]) 10/14/2015  . Depression 10/14/2015  . Basal ganglia infarction ([place]) 10/14/2015  . Acute ischemic stroke ([place])   . Left-sided weakness 10/12/2015  . Abnormal CXR 10/12/2015  . BPH associated with nocturia 08/20/2015  . Aftercare following surgery of the circulatory system, [place] 02/13/2014  . Postop check 02/06/2014  . Left inguinal hernia 12/28/2013  . Rib fractures 10/13/2013  . Fall 10/13/2013  . Dysplastic nevus of trunk 02/14/2013  . Bicipital tendinitis of right shoulder 07/12/2012  . Subacromial bursitis, right  03/28/2012  . Right spastic hemiparesis ([place]) 02/02/2012  . Occlusion and stenosis of carotid artery with cerebral infarction 01/26/2012  . Hemiplegia affecting right dominant side ([place]) 12/24/2011  . CVA (cerebral infarction) 12/15/2011  . Occlusion and stenosis of carotid artery without mention of cerebral infarction 12/08/2011  . Carotid artery plaque 12/04/2011  . Left carotid bruit 11/30/2011  . AUTONOMIC NEUROPATHY, DIABETIC 11/25/2010  . Hyperlipidemia 03/15/2008  . Obstructive sleep apnea 07/27/2007  . Diabetes 1.5, managed as type 2 ([place]) 06/15/2007  . Essential hypertension 06/15/2007    Cameron Sprang, PT, MPT [facility] [address] [place], [place], [address] Phone: [phone]   Fax:  [phone] 12/10/2015, 8:57 AM  Name: Darrell [name] MRN: [id number] Date of Birth: 1945-12-05

## 2015-12-12 ENCOUNTER — Ambulatory Visit: Payer: Medicare Other | Admitting: Rehabilitation

## 2015-12-12 ENCOUNTER — Encounter: Payer: Self-pay | Admitting: Rehabilitation

## 2015-12-12 ENCOUNTER — Ambulatory Visit: Payer: Medicare Other | Admitting: *Deleted

## 2015-12-12 DIAGNOSIS — R269 Unspecified abnormalities of gait and mobility: Secondary | ICD-10-CM

## 2015-12-12 DIAGNOSIS — R2681 Unsteadiness on feet: Secondary | ICD-10-CM | POA: Diagnosis not present

## 2015-12-12 DIAGNOSIS — G8191 Hemiplegia, unspecified affecting right dominant side: Secondary | ICD-10-CM

## 2015-12-12 DIAGNOSIS — R6889 Other general symptoms and signs: Secondary | ICD-10-CM

## 2015-12-12 DIAGNOSIS — G8111 Spastic hemiplegia affecting right dominant side: Secondary | ICD-10-CM

## 2015-12-12 DIAGNOSIS — R2689 Other abnormalities of gait and mobility: Secondary | ICD-10-CM

## 2015-12-12 DIAGNOSIS — I69359 Hemiplegia and hemiparesis following cerebral infarction affecting unspecified side: Secondary | ICD-10-CM

## 2015-12-12 DIAGNOSIS — M21371 Foot drop, right foot: Secondary | ICD-10-CM

## 2015-12-12 DIAGNOSIS — R531 Weakness: Secondary | ICD-10-CM | POA: Diagnosis not present

## 2015-12-12 NOTE — Therapy (Signed)
[facility] [address] [place], [place], [address] Phone: [phone]   Fax:  [phone]  Occupational Therapy Treatment  Patient Details  Name: Darrell [name] MRN: [id number] Date of Birth: 12-14-1945 Referring Provider: Dr Letta Pate  Encounter Date: 12/12/2015      OT End of Session - 12/12/15 1638    Visit Number 8   Number of Visits 17   Date for OT Re-Evaluation 01/10/16   Authorization Type Medicare - g code and progress note visit 10   Authorization - Visit Number 8   Authorization - Number of Visits 10   OT Start Time 0350   OT Stop Time 1615   OT Time Calculation (min) 45 min   Activity Tolerance Patient tolerated treatment well   Behavior During Therapy [facility] for tasks assessed/performed      Past Medical History  Diagnosis Date  . Diabetes mellitus   . Hypertension     dr Sherren Mocha  . Sleep apnea     cpap      sleep study > 4 yrs; no longer wearing CPAP  . Stroke [facility])     rt side weakness, 2013  . Poorly controlled type 2 diabetes mellitus with peripheral neuropathy ([place]) 10/14/2015  . History of CVA with residual deficit 10/14/2015    Past Surgical History  Procedure Laterality Date  . Heel spur surgery    . [place] surgery    . Endarterectomy  12/09/2011    Procedure: ENDARTERECTOMY CAROTID;  Surgeon: Tinnie Gens, MD;  Location: [facility] OR;  Service: Vascular;  Laterality: Left;  Left Carotid endarterectomy with dacron patch angioplasty  . Carotid endarterectomy  12/09/11    Left  . Hernia repair      x2  . Ankle surgery    . Inguinal hernia repair Left 01/22/2014    Procedure: LEFT INGUINAL HERNIA REPAIR WITH MESH;  Surgeon: [name], MD;  Location: [facility];  Service: General;  Laterality: Left;  . Insertion of mesh Left 01/22/2014    Procedure: INSERTION OF MESH;  Surgeon: [name], MD;  Location: [facility];  Service: General;  Laterality: Left;  . Colonoscopy      There were no vitals  filed for this visit.  Visit Diagnosis:  Hemiparesis affecting nondominant side as late effect of cerebrovascular accident [facility])  Poor balance  Generalized weakness  Hemiplegia affecting right dominant side (HCC)  Spastic hemiplegia affecting right dominant side ([place])      Subjective Assessment - 12/12/15 1536    Subjective  I'm feeling good, I just went for a walk outside with PT and my back feels better   Patient Stated Goals just don't have me lay on my back for therapy   Currently in Pain? Yes   Pain Score 2    Pain Location Back   Pain Orientation Lower   Pain Descriptors / Indicators Aching;Sore   Pain Type Acute pain   Pain Onset In the past 7 days   Pain Frequency Occasional   Aggravating Factors  sitting on couch   Pain Relieving Factors walking            Treatment:  Pt worked with PT prior to this OT session and stated his back pain decreased after walking outside. During OT session focused skilled intervention on dynamic standing balance/tolerance/endurance, coordination activity using L hand, stretching to right hand (also encouraging normal posture with RUE), decreasing compensatory strategies during functional tasks/activities, problem solving, following  commands. In standing, pt worked on placing small pegs in peg board. Therapist directed patient on specific way to place pegs, pt required max cueing to follow this direction; pt would continuously state "I don't understand". Pt with small cut and blood on finger, not sure it's origin. Cleaned pt up and donned band-aide. From here, worked with patient on picking up pegs off floor; therapist providing min guard assist for patient's safety. Pt then worked on simulated self-feeding task, pt tended to compensate with LUE abducting at shoulder. Had patient hold paper by side, keeping elbow by side to decrease this compensation method. Encouraged patient to work on keeping elbow by side during self-feeding and  functional tasks.                 OT Education - 12/12/15 1634    Education provided Yes   Education Details keeping elbow by side during self-feeding    Person(s) Educated Patient   Methods Explanation;Demonstration   Comprehension Verbalized understanding;Returned demonstration;Need further instruction          OT Short Term Goals - 12/12/15 1637    OT SHORT TERM GOAL #1   Title Patient will complete home activity program with min cueing - 12/09/2015   Status Partially Met  needs max cues   OT SHORT TERM GOAL #2   Title Patient will don front opening shirt with no more than set up assistance   Status Achieved   OT SHORT TERM GOAL #3   Title Patient will step over lip of shower stall with no greater than supervision   Status Achieved  with grab bar   OT SHORT TERM GOAL #4   Title Patient will complete all aspects of self bathing while in the shower with modified independence   Status Achieved  in sitting           OT Long Term Goals - 12/12/15 1638    OT LONG TERM GOAL #1   Title Patient will complete home activity / home exercise program independently 01/06/2016   Status On-going   OT LONG TERM GOAL #2   Title Patient will shower with modifeid independence   Status On-going   OT LONG TERM GOAL #3   Title Patient will complete 15-20  minutes of ADL/IADL without a rest break, includes sitting, standing, and walking, e.g. getting dressed and making the bed   Status On-going   OT LONG TERM GOAL #4   Title Patient will dress himself with modifeid independence in nder 10 minutes - cas[place] clothing   Status On-going   OT LONG TERM GOAL #5   Title Patient will demonstrate at least 3 lb increase in left pinch strength to ease independence with ADL   Status On-going               Plan - 12/12/15 1635    Clinical Impression Statement Pt making good functional progress, but continues to be limited by severe cognitive deficits that continue to impact  carryover, following commands, problem solving, and functional use of UEs.    Pt will benefit from skilled therapeutic intervention in order to improve on the following deficits (Retired) Decreased activity tolerance;Decreased balance;Decreased cognition;Decreased coordination;Decreased endurance;Decreased safety awareness;Decreased range of motion;Decreased knowledge of use of DME;Decreased knowledge of precautions;Decreased strength;Impaired perceived functional ability;Impaired flexibility;Improper body mechanics;Impaired vision/preception;Impaired UE functional use;Impaired tone   Rehab Potential Good   OT Frequency 2x / week   OT Duration 8 weeks   OT Treatment/Interventions Self-care/ADL training;Electrical Stimulation;Ultrasound;Fluidtherapy;Therapeutic  exercise;Neuromuscular education;Manual Therapy;Functional Mobility Training;DME and/or AE instruction;Energy conservation;Cognitive remediation/compensation;Visual/perceptual remediation/compensation;Patient/family education;Balance training;Therapeutic exercises;Therapeutic activities   Plan coordination of L hand, dynamic standing, functional mobility, continued education on not using compensatory strategies during functional tasks (as pt tends to abduct shoulder during activities)    Recommended Other Services none at this time   Consulted and Agree with Plan of Care Patient        Problem List Patient Active Problem List   Diagnosis Date Noted  . Gait disturbance, post-stroke 12/02/2015  . 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 Diabetes mellitus type 2 in nonobese (HCC)   . Hypoalbuminemia due to protein-calorie malnutrition ([place]) 10/15/2015  . Poorly controlled type 2 diabetes mellitus with peripheral neuropathy ([place]) 10/14/2015  . History of CVA with residual deficit 10/14/2015  . Hemiparesis affecting dominant side as late effect of stroke ([place]) 10/14/2015  . Depression 10/14/2015  . Basal ganglia infarction ([place]) 10/14/2015   . Acute ischemic stroke ([place])   . Left-sided weakness 10/12/2015  . Abnormal CXR 10/12/2015  . BPH associated with nocturia 08/20/2015  . Aftercare following surgery of the circulatory system, [place] 02/13/2014  . Postop check 02/06/2014  . Left inguinal hernia 12/28/2013  . Rib fractures 10/13/2013  . Fall 10/13/2013  . Dysplastic nevus of trunk 02/14/2013  . Bicipital tendinitis of right shoulder 07/12/2012  . Subacromial bursitis, right 03/28/2012  . Right spastic hemiparesis ([place]) 02/02/2012  . Occlusion and stenosis of carotid artery with cerebral infarction 01/26/2012  . Hemiplegia affecting right dominant side ([place]) 12/24/2011  . CVA (cerebral infarction) 12/15/2011  . Occlusion and stenosis of carotid artery without mention of cerebral infarction 12/08/2011  . Carotid artery plaque 12/04/2011  . Left carotid bruit 11/30/2011  . AUTONOMIC NEUROPATHY, DIABETIC 11/25/2010  . Hyperlipidemia 03/15/2008  . Obstructive sleep apnea 07/27/2007  . Diabetes 1.5, managed as type 2 ([place]) 06/15/2007  . Essential hypertension 06/15/2007    Chrys Racer , MS, OTR/L, CLT Pager: [phone]  12/12/2015, 4:40 PM  [facility] [address] [place], [place], [address] Phone: [phone]   Fax:  [phone]  Name: Darrell [name] MRN: [id number] Date of Birth: February 06, 1946

## 2015-12-12 NOTE — Therapy (Signed)
[facility] [address] [place], [place], [address] Phone: [phone]   Fax:  [phone]  Physical Therapy Treatment  Patient Details  Name: Darrell [name] MRN: [id number] Date of Birth: 1946-03-07 Referring Provider: Claudette Laws, MD  Encounter Date: 12/12/2015      PT End of Session - 12/12/15 1456    Visit Number 10   Number of Visits 17   Date for PT Re-Evaluation 01/10/16   Authorization Type MCR primary-G codes due every 10th visit, [employer] secondary   PT Start Time 1448   PT Stop Time 1530   PT Time Calculation (min) 42 min   Equipment Utilized During Treatment Gait belt   Activity Tolerance Patient tolerated treatment well   Behavior During Therapy [facility] for tasks assessed/performed      Past Medical History  Diagnosis Date  . Diabetes mellitus   . Hypertension     dr Tawanna Cooler  . Sleep apnea     cpap      sleep study > 4 yrs; no longer wearing CPAP  . Stroke [facility])     rt side weakness, 2013  . Poorly controlled type 2 diabetes mellitus with peripheral neuropathy (HCC) 10/14/2015  . History of CVA with residual deficit 10/14/2015    Past Surgical History  Procedure Laterality Date  . Heel spur surgery    . Heer surgery    . Endarterectomy  12/09/2011    Procedure: ENDARTERECTOMY CAROTID;  Surgeon: [name], MD;  Location: [facility] OR;  Service: Vascular;  Laterality: Left;  Left Carotid endarterectomy with dacron patch angioplasty  . Carotid endarterectomy  12/09/11    Left  . Hernia repair      x2  . Ankle surgery    . Inguinal hernia repair Left 01/22/2014    Procedure: LEFT INGUINAL HERNIA REPAIR WITH MESH;  Surgeon: Cherylynn Ridges, MD;  Location: [facility] OR;  Service: General;  Laterality: Left;  . Insertion of mesh Left 01/22/2014    Procedure: INSERTION OF MESH;  Surgeon: Cherylynn Ridges, MD;  Location: [facility] OR;  Service: General;  Laterality: Left;  . Colonoscopy      There were no vitals filed  for this visit.  Visit Diagnosis:  Abnormality of gait  Unsteadiness  Poor balance  Right foot drop  Decreased activity tolerance      Subjective Assessment - 12/12/15 1454    Subjective Reports increased pain in back today, thinks it from sitting on couch.   Limitations Walking;House hold activities   Patient Stated Goals "to improve my balance"    Currently in Pain? Yes   Pain Score 3    Pain Location Back   Pain Orientation Lower   Pain Descriptors / Indicators Aching   Pain Type Acute pain   Pain Onset Yesterday   Pain Frequency Occasional   Aggravating Factors  sitting on couch   Pain Relieving Factors Tylenol            Gait:  Continue to address outdoor gait for quality and distance to address LTGs.  He performed 2000' today with single loft strand crutch and R AFO at S level (even over grassy surfaces).  Feel that he is close to mod I level, however continues to require min cues for wider BOS and increased B hip and knee flex, esp on grass.  Feel that due to cognitive impairments, he tends to place blame on other reasons as to why he isn't picking feet  up.  He states that he feels like he is close to baseline for gait.    Self Care:  Discussed with pt and wife getting shoe button in order to ensure that brace being held in place and adequately assisting with DF.  Also educated on D/Cing earlier than anticipated due to progress and pt being close to baseline and that pt/wife may need to pick brace up from [employer] if DC's early.  Both verbalized understanding.    NMR:  Ended session with cone tapping/stepping task in order to increase isolated hip/knee flexion to carry over to gait.  Again, requires max verbal cues for sequencing through task due to cognitive deficits.                        PT Education - 12/12/15 1456    Education provided Yes   Education Details education on possible D/C prior to end of month, discussion regarding shoe button with  wife.    Person(s) Educated Patient;Spouse   Methods Explanation;Verbal cues   Comprehension Verbalized understanding;Verbal cues required          PT Short Term Goals - 12/09/15 1520    PT SHORT TERM GOAL #1   Title Pt will initiate HEP for strengthening and balance in order to decrease balance and improve functional strength.  (Target Date: 12/09/15)   Baseline met 12/06/15    Status Achieved   PT SHORT TERM GOAL #2   Title Pt will improve DGI score to 17/24 in order to indicate decreased fall risk and improved functional balance.     Baseline 13/24 on 12/09/15   Status Not Met   PT SHORT TERM GOAL #3   Title Pt will perform 8/10 sit<>stand with single UE support at mod I level in order to indicate improvement in functional strength.    Baseline Performed 10 reps without UE support on 12/06/15   Status Achieved   PT SHORT TERM GOAL #4   Title Pt will increase gait speed to 2.60 ft/sec in order to indicate decreased fall risk and improved efficiency of gait.     Baseline 2.74 ft/sec on 12/09/15   Status Achieved   PT SHORT TERM GOAL #5   Title Pt will ambulate 200' over indoor surfaces without AD in order to indicate safe home negotiation and progression to PLOF.     Baseline met 12/09/15   Status Achieved   PT SHORT TERM GOAL #6   Title Will assess 6MWT and improve distance by 150' in order to indicate improvement in functional endurance.     Baseline 661' on 11/26/15 w/ min/guard A           PT Long Term Goals - 12/12/15 1458    PT LONG TERM GOAL #1   Title Pt will be independent with HEP for BLE strengthening and balance to indicate decreased fall risk and improvement in functional mobility.  (Target Date: 01/06/16)   PT LONG TERM GOAL #2   Title Pt will perform 8/10 sit<>stand without UE support at mod I level to indicate improvement in functional strength.    PT LONG TERM GOAL #3   Title Pt will ambulate >1000' over varying community surfaces (including grass, curb, and  ramp) at mod I level with use of L forearm crutch to indicate safe return to community at [facility].     PT LONG TERM GOAL #4   Title Pt will be able to ambulate around neighborhood "loop" (.  6 mile) with L forearm crutch at mod I level to indicate improvement in functional endurance and return to leisure activity.     PT LONG TERM GOAL #5   Title Pt will increase DGI >19 points in order to indicate decreased fall risk.    PT LONG TERM GOAL #6   Title Pt will increase SIS-mobility to 48.4% in order to indicate self reported improvement in mobility.                 Plan - 12/28/15 1708    Clinical Impression Statement Skilled session focused on outdoor gait to continue to work towards LTG of outdoors gait with single loft strand crutch with R AFO at mod I level as well as endurance to complete loop in neighborhood.  Tolerated well and discussed possible D/C sooner than expected.  Pt verbalized understanding.    Pt will benefit from skilled therapeutic intervention in order to improve on the following deficits Abnormal gait;Decreased activity tolerance;Decreased balance;Decreased cognition;Decreased endurance;Decreased knowledge of precautions;Decreased scar mobility;Decreased strength;Impaired perceived functional ability;Impaired flexibility;Impaired tone;Impaired UE functional use;Improper body mechanics;Postural dysfunction   Rehab Potential Good   Clinical Impairments Affecting Rehab Potential co-morbidities, prior CVA   PT Frequency 2x / week   PT Duration 8 weeks   PT Treatment/Interventions ADLs/Self Care Home Management;Electrical Stimulation;DME Instruction;Gait training;Stair training;Functional mobility training;Therapeutic activities;Therapeutic exercise;Balance training;Neuromuscular re-education;Cognitive remediation;Patient/family education;Orthotic Fit/Training;Manual techniques;Vestibular   PT Next Visit Plan  work on gait without AD for indoor negotiation, balance and  strengthening, repeat (done PLS AFO to see if this increases distance)    Consulted and Agree with Plan of Care Patient;Family member/caregiver   Family Member Consulted wife          G-Codes - Dec 28, 2015 1457    Functional Assessment Tool Used DGI 13/24   Functional Limitation Mobility: Walking and moving around   Mobility: Walking and Moving Around Current Status [phone]) At least 40 percent but less than 60 percent impaired, limited or restricted   Mobility: Walking and Moving Around Goal Status [phone]) At least 1 percent but less than 20 percent impaired, limited or restricted      Problem List Patient Active Problem List   Diagnosis Date Noted  . Gait disturbance, post-stroke 12/02/2015  . 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 Diabetes mellitus type 2 in nonobese (HCC)   . Hypoalbuminemia due to protein-calorie malnutrition (HCC) 10/15/2015  . Poorly controlled type 2 diabetes mellitus with peripheral neuropathy (HCC) 10/14/2015  . History of CVA with residual deficit 10/14/2015  . Hemiparesis affecting dominant side as late effect of stroke (HCC) 10/14/2015  . Depression 10/14/2015  . Basal ganglia infarction (HCC) 10/14/2015  . Acute ischemic stroke (HCC)   . Left-sided weakness 10/12/2015  . Abnormal CXR 10/12/2015  . BPH associated with nocturia 08/20/2015  . Aftercare following surgery of the circulatory system, NEC 02/13/2014  . Postop check 02/06/2014  . Left inguinal hernia 12/28/2013  . Rib fractures 10/13/2013  . Fall 10/13/2013  . Dysplastic nevus of trunk 02/14/2013  . Bicipital tendinitis of right shoulder 07/12/2012  . Subacromial bursitis, right 03/28/2012  . Right spastic hemiparesis (HCC) 02/02/2012  . Occlusion and stenosis of carotid artery with cerebral infarction 01/26/2012  . Hemiplegia affecting right dominant side (HCC) 12/24/2011  . CVA (cerebral infarction) 12/15/2011  . Occlusion and stenosis of carotid artery without mention of  cerebral infarction 12/08/2011  . Carotid artery plaque 12/04/2011  . Left carotid bruit 11/30/2011  . AUTONOMIC  NEUROPATHY, DIABETIC 11/25/2010  . Hyperlipidemia 03/15/2008  . Obstructive sleep apnea 07/27/2007  . Diabetes 1.5, managed as type 2 ([place]) 06/15/2007  . Essential hypertension 06/15/2007    Cameron Sprang, PT, MPT [facility] [address] [place], [place], [address] Phone: [phone]   Fax:  [phone] 12/12/2015, 5:13 PM  Name: Darrell [name] MRN: [id number] Date of Birth: 05/06/1946

## 2015-12-13 ENCOUNTER — Ambulatory Visit (INDEPENDENT_AMBULATORY_CARE_PROVIDER_SITE_OTHER): Payer: Medicare Other | Admitting: Neurology

## 2015-12-13 ENCOUNTER — Encounter: Payer: Self-pay | Admitting: Neurology

## 2015-12-13 VITALS — BP 140/88 | HR 69 | Ht 70.0 in | Wt 204.8 lb

## 2015-12-13 DIAGNOSIS — I6381 Other cerebral infarction due to occlusion or stenosis of small artery: Secondary | ICD-10-CM

## 2015-12-13 DIAGNOSIS — I639 Cerebral infarction, unspecified: Secondary | ICD-10-CM | POA: Diagnosis not present

## 2015-12-13 NOTE — Progress Notes (Signed)
[facility] [address] [place]. [place] [address] [phone]       OFFICE FOLLOW-UP NOTE  [name] Date of Birth:  05/19/1946 Medical Record Number:  [id number]   HPI: [name] is a 70 year old Caucasian male seen today for first office follow-up visit following recent hospital admission for stroke in December 2016. Darrell [name] is an 70 y.o. 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 At baseline patient has right HP, ambulates with a cane, and can perform the majority of his ADL without assistance. Wife is at the bedside said that the day prior to admission afternoon her husband complained of having trouble walking. She said that they went to a restaurant and he had to be help out of the car, and while eating couldn't use the utensils properly with his left hand. She did not notice slurred speech, facial asymmetry, confusion, and he denies HA, vertigo, double vision, or visual impairment..She stated that he was advised to stop taking aspirin due to frequent skin bruising.  CT head and MRI brain were completed, personally reviewed, and there is evidence of an acute small infarct involving the right centrum semiovale. Date last known well: 10/11/15 Time last known well: uncertain  tPA Given: no, late presentation   patient underwent MRA scan of the brain which showed no large vessel stenosis. Carotid ultrasound showed patent left common carotid to internal carotid bypass graft without significant stenosis. Transthoracic echo showed normal ejection fraction without cardiac source of embolism. Hemoglobin A1c was slightly elevated at 6.9. LDL cholesterol was 40 mg percent. Patient had stopped his aspirin several months ago and it was restarted. He  was transferred to inpatient rehabilitation and did well is currently at home. He still getting outpatient physical and occupation therapy. He still feels his left-sided strength is not back to normal and his left leg is heavy]. He uses a cane to walk. He is had no falls. He is tolerating aspirin well without bruising or bleeding. States his blood pressure is usually in the 120s to 130s at home though it is slightly elevated today at 140/88 in office. His primary physician recently increased his blood pressure medication last week. His fasting sugars range in the 100 range. Patient is watching his diet and is quite active. He has overall lost 70 pounds since his initial stroke 4 years ago. Patient was seen by me in 2013 for a perioperative stroke following  elective left carotid surgery which wente wrong. He had developed hematoma following the surgery he required emergency repeat surgery with common carotid to internal carotid artery bypass graft. He had a large left MCA infarct postprocedure and had spastic right hemi-paresis but was able to walk with a cane. ROS:   14 system review of systems is positive for  fatigue, easy bruising bruising, bleeding, confusion, gait difficulty and all other systems negative  PMH:  Past Medical History  Diagnosis Date  . Diabetes mellitus   . Hypertension     dr Sherren Mocha  . Sleep apnea     cpap      sleep study > 4 yrs; no longer wearing CPAP  . Stroke [facility])     rt side weakness, 2013  . Poorly controlled type 2 diabetes mellitus with peripheral neuropathy (  [place]) 10/14/2015  . History of CVA with residual deficit 10/14/2015    Social History:  Social History   Social History  . Marital Status: Married    Spouse Name: N/A  . Number of Children: N/A  . Years of Education: N/A   Occupational History  . Not on file.   Social History Main Topics  . Smoking status: Former Smoker -- 2.50 packs/day for 30 years    Types: Cigarettes    Quit date: 12/07/2001  .  Smokeless tobacco: Never Used  . Alcohol Use: No  . Drug Use: No  . Sexual Activity: Not on file   Other Topics Concern  . Not on file   Social History Narrative    Medications:   Current Outpatient Prescriptions on File Prior to Visit  Medication Sig Dispense Refill  . aspirin EC 325 MG tablet Take 1 tablet (325 mg total) by mouth daily. 30 tablet 0  . citalopram (CELEXA) 10 MG tablet Take 1 tablet (10 mg total) by mouth daily. 100 tablet 3  . latanoprost (XALATAN) 0.005 % ophthalmic solution Place 1 drop into both eyes at bedtime. 2.5 mL 0  . lisinopril (PRINIVIL,ZESTRIL) 10 MG tablet 1 tablet in the evening with dinner 90 tablet 3  . lisinopril (PRINIVIL,ZESTRIL) 20 MG tablet Take 20 mg by mouth daily.    . metFORMIN (GLUCOPHAGE) 1000 MG tablet take 1 tablet by mouth twice a day with meals 200 tablet 3  . Multiple Vitamin (MULTIVITAMIN WITH MINERALS) TABS tablet Take 1 tablet by mouth daily. 100 tablet 0  . ONE TOUCH ULTRA TEST test strip TEST once daily 100 each PRN   No current facility-administered medications on file prior to visit.    Allergies:   Allergies  Allergen Reactions  . Oxycodone-Acetaminophen     REACTION: feels sick (patient reports taking acetaminophen without issue)     Physical Exam General: well developed, well nourished, seated, in no evident distress Head: head normocephalic and atraumatic.  Neck: supple with no carotid or supraclavicular bruits Cardiovascular: regular rate and rhythm, no murmurs Musculoskeletal: no deformity Skin:  no rash/petichiae Vascular:  Normal pulses all extremities Filed Vitals:   12/13/15 0906  BP: 140/88  Pulse: 69   Neurologic Exam Mental Status: Awake and fully alert. Oriented to place and time. Recent and remote memory intact. Attention span, concentration and fund of knowledge appropriate. Mood and affect appropriate.  Cranial Nerves: Fundoscopic exam reveals sharp disc margins. Pupils equal, briskly reactive  to light. Extraocular movements full without nystagmus. Visual fields full to confrontation. Hearing intact. Facial sensation intact. Mild right lower face weakness., tongue, palate moves normally and symmetrically.  Motor: Mild spastic right hemiparesis with 4 out of 5 strength but significant weakness of the right grip and intrinsic hand muscles. Mild weakness of the left grip and diminished fine finger movements on the left. Spasticity of both the right upper or lower extremity and to a lesser degree left lower extremity.  Sensory.: intact to touch ,pinprick .position and vibratory sensation.  Coordination: Impaired finger to nose and needle coordination on the right. Normal on the left.  Gait and Station: Spastic hemiplegic gait with dragging of the right leg with right foot drop and also some stiffness of the left leg. Unable to walk tandem uses a cane to walk Reflexes: 2+ and asymmetric brisker on the right. Toes downgoing.   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 Vascular risk factors of diabetes and hyperlipidemia and carotid disease    PLAN: I had a long d/w patient and his wife  about his recent stroke, risk for recurrent stroke/TIAs, personally independently reviewed imaging studies and stroke evaluation results and answered questions.Continue aspirin 325 mg daily  for secondary stroke prevention and maintain strict control of hypertension with blood pressure goal below 130/90, diabetes with hemoglobin A1c goal below 6.5% and lipids with LDL cholesterol goal below 70 mg/dL. I also advised the patient to eat a healthy diet with plenty of whole grains, cereals, fruits and vegetables, exercise regularly and maintain ideal body weight .He was advised to continue ongoing outpatient physical and  occupational therapy. Greater than 50% of time during this 25 minute visit was spent on counseling,explanation of diagnosis, planning of further management, discussion with patient and family and coordination of care Followup in the future with stroke nurse practitioner in 6 months or call earlier if necessary Antony Contras, MD  Note: This document was prepared with digital dictation and possible smart phrase technology. Any transcriptional errors that result from this process are unintentional

## 2015-12-13 NOTE — Patient Instructions (Signed)
I had a long d/w patient and his wife  about his recent stroke, risk for recurrent stroke/TIAs, personally independently reviewed imaging studies and stroke evaluation results and answered questions.Continue aspirin 325 mg daily  for secondary stroke prevention and maintain strict control of hypertension with blood pressure goal below 130/90, diabetes with hemoglobin A1c goal below 6.5% and lipids with LDL cholesterol goal below 70 mg/dL. I also advised the patient to eat a healthy diet with plenty of whole grains, cereals, fruits and vegetables, exercise regularly and maintain ideal body weight .He was advised to continue ongoing outpatient physical and occupational therapy. Followup in the future with stroke nurse practitioner in 6 months or call earlier if necessary Stroke Prevention Some medical conditions and behaviors are associated with an increased chance of having a stroke. You may prevent a stroke by making healthy choices and managing medical conditions. HOW CAN I REDUCE MY RISK OF HAVING A STROKE?   Stay physically active. Get at least 30 minutes of activity on most or all days.  Do not smoke. It may also be helpful to avoid exposure to secondhand smoke.  Limit alcohol use. Moderate alcohol use is considered to be:  No more than 2 drinks per day for men.  No more than 1 drink per day for nonpregnant women.  Eat healthy foods. This involves:  Eating 5 or more servings of fruits and vegetables a day.  Making dietary changes that address high blood pressure (hypertension), high cholesterol, diabetes, or obesity.  Manage your cholesterol levels.  Making food choices that are high in fiber and low in saturated fat, trans fat, and cholesterol may control cholesterol levels.  Take any prescribed medicines to control cholesterol as directed by your health care provider.  Manage your diabetes.  Controlling your carbohydrate and sugar intake is recommended to manage diabetes.  Take  any prescribed medicines to control diabetes as directed by your health care provider.  Control your hypertension.  Making food choices that are low in salt (sodium), saturated fat, trans fat, and cholesterol is recommended to manage hypertension.  Ask your health care provider if you need treatment to lower your blood pressure. Take any prescribed medicines to control hypertension as directed by your health care provider.  If you are 48-16 years of age, have your blood pressure checked every 3-5 years. If you are 75 years of age or older, have your blood pressure checked every year.  Maintain a healthy weight.  Reducing calorie intake and making food choices that are low in sodium, saturated fat, trans fat, and cholesterol are recommended to manage weight.  Stop drug abuse.  Avoid taking birth control pills.  Talk to your health care provider about the risks of taking birth control pills if you are over 48 years old, smoke, get migraines, or have ever had a blood clot.  Get evaluated for sleep disorders (sleep apnea).  Talk to your health care provider about getting a sleep evaluation if you snore a lot or have excessive sleepiness.  Take medicines only as directed by your health care provider.  For some people, aspirin or blood thinners (anticoagulants) are helpful in reducing the risk of forming abnormal blood clots that can lead to stroke. If you have the irregular heart rhythm of atrial fibrillation, you should be on a blood thinner unless there is a good reason you cannot take them.  Understand all your medicine instructions.  Make sure that other conditions (such as anemia or atherosclerosis) are addressed. SEEK  IMMEDIATE MEDICAL CARE IF:   You have sudden weakness or numbness of the face, arm, or leg, especially on one side of the body.  Your face or eyelid droops to one side.  You have sudden confusion.  You have trouble speaking (aphasia) or understanding.  You have  sudden trouble seeing in one or both eyes.  You have sudden trouble walking.  You have dizziness.  You have a loss of balance or coordination.  You have a sudden, severe headache with no known cause.  You have new chest pain or an irregular heartbeat. Any of these symptoms may represent a serious problem that is an emergency. Do not wait to see if the symptoms will go away. Get medical help at once. Call your local emergency services (911 in [place] Do not drive yourself to the hospital.   This information is not intended to replace advice given to you by your health care provider. Make sure you discuss any questions you have with your health care provider.   Document Released: 11/19/2004 Document Revised: 11/02/2014 Document Reviewed: 04/14/2013 Elsevier Interactive Patient Education [employer].

## 2015-12-16 ENCOUNTER — Encounter: Payer: Self-pay | Admitting: Occupational Therapy

## 2015-12-16 ENCOUNTER — Ambulatory Visit: Payer: Medicare Other | Admitting: Rehabilitation

## 2015-12-16 ENCOUNTER — Ambulatory Visit: Payer: Medicare Other | Admitting: Occupational Therapy

## 2015-12-16 ENCOUNTER — Encounter: Payer: Self-pay | Admitting: Rehabilitation

## 2015-12-16 DIAGNOSIS — I69359 Hemiplegia and hemiparesis following cerebral infarction affecting unspecified side: Secondary | ICD-10-CM

## 2015-12-16 DIAGNOSIS — R269 Unspecified abnormalities of gait and mobility: Secondary | ICD-10-CM

## 2015-12-16 DIAGNOSIS — R2681 Unsteadiness on feet: Secondary | ICD-10-CM

## 2015-12-16 DIAGNOSIS — R531 Weakness: Secondary | ICD-10-CM | POA: Diagnosis not present

## 2015-12-16 DIAGNOSIS — G8111 Spastic hemiplegia affecting right dominant side: Secondary | ICD-10-CM

## 2015-12-16 DIAGNOSIS — R2689 Other abnormalities of gait and mobility: Secondary | ICD-10-CM | POA: Diagnosis not present

## 2015-12-16 DIAGNOSIS — F0789 Other personality and behavioral disorders due to known physiological condition: Secondary | ICD-10-CM

## 2015-12-16 DIAGNOSIS — F09 Unspecified mental disorder due to known physiological condition: Secondary | ICD-10-CM

## 2015-12-16 DIAGNOSIS — R6889 Other general symptoms and signs: Secondary | ICD-10-CM

## 2015-12-16 DIAGNOSIS — M21371 Foot drop, right foot: Secondary | ICD-10-CM

## 2015-12-16 NOTE — Therapy (Signed)
[facility] [address] [place], [place], [address] Phone: [phone]   Fax:  [phone]  Occupational Therapy Treatment  Patient Details  Name: Darrell [name] MRN: [id number] Date of Birth: 1946-03-09 Referring Provider: Dr Darrell [name]  Encounter Date: 12/16/2015      OT End of Session - 12/16/15 1727    Visit Number 9   Number of Visits 17   Date for OT Re-Evaluation 01/10/16   Authorization Type Medicare - g code and progress note visit 10   Authorization - Visit Number 9   Authorization - Number of Visits 10   OT Start Time 6314   OT Stop Time 1610   OT Time Calculation (min) 38 min   Activity Tolerance Patient tolerated treatment well      Past Medical History  Diagnosis Date  . Diabetes mellitus   . Hypertension     dr Sherren Mocha  . Sleep apnea     cpap      sleep study > 4 yrs; no longer wearing CPAP  . Stroke [facility])     rt side weakness, 2013  . Poorly controlled type 2 diabetes mellitus with peripheral neuropathy ([place]) 10/14/2015  . History of CVA with residual deficit 10/14/2015    Past Surgical History  Procedure Laterality Date  . Heel spur surgery    . [place] surgery    . Endarterectomy  12/09/2011    Procedure: ENDARTERECTOMY CAROTID;  Surgeon: Tinnie Gens, MD;  Location: [facility] OR;  Service: Vascular;  Laterality: Left;  Left Carotid endarterectomy with dacron patch angioplasty  . Carotid endarterectomy  12/09/11    Left  . Hernia repair      x2  . Ankle surgery    . Inguinal hernia repair Left 01/22/2014    Procedure: LEFT INGUINAL HERNIA REPAIR WITH MESH;  Surgeon: [name], MD;  Location: [facility];  Service: General;  Laterality: Left;  . Insertion of mesh Left 01/22/2014    Procedure: INSERTION OF MESH;  Surgeon: [name], MD;  Location: [facility];  Service: General;  Laterality: Left;  . Colonoscopy      There were no vitals filed for this visit.  Visit Diagnosis:  Hemiparesis  affecting nondominant side as late effect of cerebrovascular accident [facility])  Generalized weakness  Spastic hemiplegia affecting right dominant side (HCC)  Cognitive and neurobehavioral dysfunction  Decreased activity tolerance      Subjective Assessment - 12/16/15 1538    Subjective  PT wore me out today - I am a little shaky on my feet.   Pertinent History see epic   Patient Stated Goals just don't have me lay on my back for therapy   Currently in Pain? No/denies                      OT Treatments/Exercises (OP) - 12/16/15 0001    ADLs   ADL Comments Checked remaining goals - see goals for updates.  Treatment focused on functional mobility, dynamic standing balance, activity tolerance, and safety using functional tasks at ambulatory level without the crutch as pt does not use his crutch in the house.  Pt able to tolerate 20 minutes of activity after PT session, then additional 10 minutes after a rest break. Also provided wife with information regarding [employer] as follow up to therapy.  Pt ready for discharge.  OT Short Term Goals - January 15, 2016 1541    OT SHORT TERM GOAL #1   Title Patient will complete home activity program with min cueing - 12/09/2015   Status Partially Met  needs max cues   OT SHORT TERM GOAL #2   Title Patient will don front opening shirt with no more than set up assistance   Status Achieved   OT SHORT TERM GOAL #3   Title Patient will step over lip of shower stall with no greater than supervision   Status Achieved  with grab bar   OT SHORT TERM GOAL #4   Title Patient will complete all aspects of self bathing while in the shower with modified independence   Status Achieved  in sitting           OT Long Term Goals - Jan 15, 2016 1541    OT LONG TERM GOAL #1   Title Patient will complete home activity / home exercise program independently 01/06/2016   Status Achieved   OT LONG TERM GOAL #2   Title Patient  will shower with modifeid independence   Status Deferred  Pt's wife provides set up with soapy sponge and distant supervision. Wife does not feel comfortable leaving pt in shower room by himself. Pt states "I don't want to get the sponge soapy I want my wife to do that"   OT LONG TERM GOAL #3   Title Patient will complete 15-20  minutes of ADL/IADL without a rest break, includes sitting, standing, and walking, e.g. getting dressed and making the bed   Status Achieved   OT LONG TERM GOAL #4   Title Patient will dress himself with modifeid independence in nder 10 minutes - casula clothing   Status Achieved   OT LONG TERM GOAL #5   Title Patient will demonstrate at least 3 lb increase in left pinch strength to ease independence with ADL   Status Achieved               Plan - 01-15-16 1726    Clinical Impression Statement Pt has met all goals and is ready for discharge - pt and wife in agreement with plan.    Pt will benefit from skilled therapeutic intervention in order to improve on the following deficits (Retired) Decreased activity tolerance;Decreased balance;Decreased cognition;Decreased coordination;Decreased endurance;Decreased safety awareness;Decreased range of motion;Decreased knowledge of use of DME;Decreased knowledge of precautions;Decreased strength;Impaired perceived functional ability;Impaired flexibility;Improper body mechanics;Impaired vision/preception;Impaired UE functional use;Impaired tone   Rehab Potential Good   OT Frequency 2x / week   OT Duration 8 weeks   OT Treatment/Interventions Self-care/ADL training;Electrical Stimulation;Ultrasound;Fluidtherapy;Therapeutic exercise;Neuromuscular education;Manual Therapy;Functional Mobility Training;DME and/or AE instruction;Energy conservation;Cognitive remediation/compensation;Visual/perceptual remediation/compensation;Patient/family education;Balance training;Therapeutic exercises;Therapeutic activities   Plan d/c from [place] and Agree with Plan of Care Patient   Family Member Consulted wife [name] - 15-Jan-2016 1728    Functional Assessment Tool Used skilled clinical observation   Functional Limitation Carrying, moving and handling objects  of LUE   Carrying, Moving and Handling Objects Current Status [phone]) At least 1 percent but less than 20 percent impaired, limited or restricted   Carrying, Moving and Handling Objects Goal Status (U0454) At least 40 percent but less than 60 percent impaired, limited or restricted   Carrying, Moving and Handling Objects Discharge Status [phone]) At least 1 percent but less than 20 percent impaired, limited or restricted      Problem List Patient Active  Problem List   Diagnosis Date Noted  . Lacunar infarct, acute ([place]) 12/13/2015  . Gait disturbance, post-stroke 12/02/2015  . 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 Diabetes mellitus type 2 in nonobese (HCC)   . Hypoalbuminemia due to protein-calorie malnutrition ([place]) 10/15/2015  . Poorly controlled type 2 diabetes mellitus with peripheral neuropathy ([place]) 10/14/2015  . History of CVA with residual deficit 10/14/2015  . Hemiparesis affecting dominant side as late effect of stroke ([place]) 10/14/2015  . Depression 10/14/2015  . Basal ganglia infarction ([place]) 10/14/2015  . Acute ischemic stroke ([place])   . Left-sided weakness 10/12/2015  . Abnormal CXR 10/12/2015  . BPH associated with nocturia 08/20/2015  . Aftercare following surgery of the circulatory system, [place] 02/13/2014  . Postop check 02/06/2014  . Left inguinal hernia 12/28/2013  . Rib fractures 10/13/2013  . Fall 10/13/2013  . Dysplastic nevus of trunk 02/14/2013  . Bicipital tendinitis of right shoulder 07/12/2012  . Subacromial bursitis, right 03/28/2012  . Right spastic hemiparesis ([place]) 02/02/2012  . Occlusion and stenosis of carotid artery with cerebral infarction 01/26/2012  . Hemiplegia affecting right dominant side  ([place]) 12/24/2011  . CVA (cerebral infarction) 12/15/2011  . Occlusion and stenosis of carotid artery without mention of cerebral infarction 12/08/2011  . Carotid artery plaque 12/04/2011  . Left carotid bruit 11/30/2011  . AUTONOMIC NEUROPATHY, DIABETIC 11/25/2010  . Hyperlipidemia 03/15/2008  . Obstructive sleep apnea 07/27/2007  . Diabetes 1.5, managed as type 2 ([place]) 06/15/2007  . Essential hypertension 06/15/2007   OCCUPATIONAL THERAPY DISCHARGE SUMMARY  Visits from Start of Care: 9  Current functional level related to goals / functional outcomes: See above   Remaining deficits: R spastic hemiplegia, decreased dynamic standing balance   Education / Equipment: HEP Plan: Patient agrees to discharge.  Patient goals were met. Patient is being discharged due to meeting the stated rehab goals.  ?????     Quay Burow, OTR/L 12/16/2015, 5:29 PM  [facility] [address] [place], [place], [address] Phone: [phone]   Fax:  [phone]  Name: Darrell [name] MRN: [id number] Date of Birth: 1946/10/04

## 2015-12-17 NOTE — Therapy (Signed)
[facility] [address] [place], [place], [address] Phone: [phone]   Fax:  [phone]  Physical Therapy Treatment and D/C Summary  Patient Details  Name: Darrell [name] MRN: [id number] Date of Birth: Mar 17, 1946 Referring Provider: Alysia Penna, MD  Encounter Date: 12/16/2015      PT End of Session - 12/16/15 1449    Visit Number 11   Number of Visits 17   Date for PT Re-Evaluation 01/10/16   Authorization Type MCR primary-G codes due every 10th visit, [employer] secondary   PT Start Time 1447   PT Stop Time 1530   PT Time Calculation (min) 43 min   Equipment Utilized During Treatment Gait belt   Activity Tolerance Patient tolerated treatment well   Behavior During Therapy [facility] for tasks assessed/performed      Past Medical History  Diagnosis Date  . Diabetes mellitus   . Hypertension     dr Sherren Mocha  . Sleep apnea     cpap      sleep study > 4 yrs; no longer wearing CPAP  . Stroke [facility])     rt side weakness, 2013  . Poorly controlled type 2 diabetes mellitus with peripheral neuropathy ([place]) 10/14/2015  . History of CVA with residual deficit 10/14/2015    Past Surgical History  Procedure Laterality Date  . Heel spur surgery    . [place] surgery    . Endarterectomy  12/09/2011    Procedure: ENDARTERECTOMY CAROTID;  Surgeon: Tinnie Gens, MD;  Location: [facility] OR;  Service: Vascular;  Laterality: Left;  Left Carotid endarterectomy with dacron patch angioplasty  . Carotid endarterectomy  12/09/11    Left  . Hernia repair      x2  . Ankle surgery    . Inguinal hernia repair Left 01/22/2014    Procedure: LEFT INGUINAL HERNIA REPAIR WITH MESH;  Surgeon: [name], MD;  Location: [facility];  Service: General;  Laterality: Left;  . Insertion of mesh Left 01/22/2014    Procedure: INSERTION OF MESH;  Surgeon: [name], MD;  Location: [facility];  Service: General;  Laterality: Left;  . Colonoscopy      There were  no vitals filed for this visit.  Visit Diagnosis:  Poor balance  Abnormality of gait  Unsteadiness  Right foot drop  Generalized weakness  Spastic hemiplegia affecting right dominant side (HCC)      Subjective Assessment - 12/16/15 1448    Subjective Reports walking over the weekend, went for 17 minutes with wife.     Limitations Walking;House hold activities   Patient Stated Goals "to improve my balance"    Currently in Pain? No/denies            TE:  Performed 6 MWT during session with noted improvement from baseline of 661' to 839', indicative of functional improvement in endurance.  Educated pt on results and importance of continuing to ambulate in community and returning to community fitness.    Self Care:  Had a long discussion with pt and wife regarding return to community fitness with [employer]ne[employer] program and provided gym locations that accept this program.  Also educated on pt needing to pick up brace from [employer] due to earlier D/C and number with address on how to pick up.   Ended with education on gait outdoors at mod I level with crutch and once pt has brace.  Both verbalized understanding.    Gait:  Performed >1000' ambulation  on varying outdoor surfaces with L forearm crutch and R PLS AFO for improved DF assist and foot clearance.   Performed at mod I level today with no evidence of imbalance and pt with good demonstration in changing gait speed for safety when going over grass or up/down curb step.  Tolerated well with some fatigue following session due to and outdoor gait.                       PT Education - 12/16/15 1449    Education provided Yes   Education Details community fitness, picking up brace at [place], mod I outdoor ambulation when pt has brace   Person(s) Educated Patient;Spouse   Methods Explanation   Comprehension Verbalized understanding          PT Short Term Goals - 12/16/15 1503    PT SHORT TERM GOAL #1    Title Pt will initiate HEP for strengthening and balance in order to decrease balance and improve functional strength.  (Target Date: 12/09/15)   Baseline met 12/06/15    Status Achieved   PT SHORT TERM GOAL #2   Title Pt will improve DGI score to 17/24 in order to indicate decreased fall risk and improved functional balance.     Baseline 13/24 on 12/09/15   Status Not Met   PT SHORT TERM GOAL #3   Title Pt will perform 8/10 sit<>stand with single UE support at mod I level in order to indicate improvement in functional strength.    Baseline Performed 10 reps without UE support on 12/06/15   Status Achieved   PT SHORT TERM GOAL #4   Title Pt will increase gait speed to 2.60 ft/sec in order to indicate decreased fall risk and improved efficiency of gait.     Baseline 2.74 ft/sec on 12/09/15   Status Achieved   PT SHORT TERM GOAL #5   Title Pt will ambulate 200' over indoor surfaces without AD in order to indicate safe home negotiation and progression to PLOF.     Baseline met 12/09/15   Status Achieved   PT SHORT TERM GOAL #6   Title Will assess and improve distance by 150' in order to indicate improvement in functional endurance.     Baseline 661' on 11/26/15 w/ min/guard A, 839' mod I w/ crutch on 12/16/15   Status Achieved           PT Long Term Goals - 12/16/15 1453    PT LONG TERM GOAL #1   Title Pt will be independent with HEP for BLE strengthening and balance to indicate decreased fall risk and improvement in functional mobility.  (Target Date: 01/06/16)   Baseline met 12/16/15   Status Achieved   PT LONG TERM GOAL #2   Title Pt will perform 8/10 sit<>stand without UE support at mod I level to indicate improvement in functional strength.    Baseline met 12/06/15   Status Achieved   PT LONG TERM GOAL #3   Title Pt will ambulate >1000' over varying community surfaces (including grass, curb, and ramp) at mod I level with use of L forearm crutch to indicate safe return to community  at [facility].     Baseline met with use of AFO and crutch 12/16/15   Status Achieved   PT LONG TERM GOAL #4   Title Pt will be able to ambulate around neighborhood "loop" (.6 mile) with L forearm crutch at mod I level to indicate  improvement in functional endurance and return to leisure activity.     Baseline Pt states he is up to 17 mins, but is not yet to full loop 12/16/15   Status Partially Met   PT LONG TERM GOAL #5   Title Pt will increase DGI >19 points in order to indicate decreased fall risk.    Baseline 13/24 on 12/09/15, likely to not meet this goal due to increased tone and safety concerns with increased speed.    Status Not Met   PT LONG TERM GOAL #6   Title Pt will increase SIS-mobility to 48.4% in order to indicate self reported improvement in mobility.     Baseline did not have time to assess   Status Unable to assess               Plan - 12/16/15 1449    Clinical Impression Statement Skilled session focused on addressing LTG's in preparation for early D/C.  Pt has met 3/6 (partially meeting a 4th goal) during this session.  Feel that DGI goal would not be met as pt tends to demonstrate increased tone with increased speed of movement, therefore recommend he continue to require increased time to complete these tasks.  Did not have time to re-assess SIS during session.  Educated pt and wife on continued community fitness with [employer] program, guidance on how to pick up brace at [employer], and that pt can be mod I outdoors once he has brace.  Both verbalized understanding.    Pt will benefit from skilled therapeutic intervention in order to improve on the following deficits Abnormal gait;Decreased activity tolerance;Decreased balance;Decreased cognition;Decreased endurance;Decreased knowledge of precautions;Decreased scar mobility;Decreased strength;Impaired perceived functional ability;Impaired flexibility;Impaired tone;Impaired UE functional use;Improper body mechanics;Postural  dysfunction   Rehab Potential Good   Clinical Impairments Affecting Rehab Potential co-morbidities, prior CVA   PT Frequency 2x / week   PT Duration 8 weeks   PT Treatment/Interventions ADLs/Self Care Home Management;Electrical Stimulation;DME Instruction;Gait training;Stair training;Functional mobility training;Therapeutic activities;Therapeutic exercise;Balance training;Neuromuscular re-education;Cognitive remediation;Patient/family education;Orthotic Fit/Training;Manual techniques;Vestibular   PT Next Visit Plan n/a   Consulted and Agree with Plan of Care Patient;Family member/caregiver   Family Member Consulted wife          G-Codes - January 14, 2016 1012    Functional Assessment Tool Used DGI 13/24   Functional Limitation Mobility: Walking and moving around   Mobility: Walking and Moving Around Current Status [phone]) At least 40 percent but less than 60 percent impaired, limited or restricted   Mobility: Walking and Moving Around Goal Status [phone]) At least 40 percent but less than 60 percent impaired, limited or restricted   Mobility: Walking and Moving Around Discharge Status [phone]) At least 40 percent but less than 60 percent impaired, limited or restricted      PHYSICAL THERAPY DISCHARGE SUMMARY  Visits from Start of Care: 11  Current functional level related to goals / functional outcomes: See LTGs   Remaining deficits: Continues to have high level balance deficits, however most deficits are related to prior CVA affecting RUE/LE as well as cognition.     Education / Equipment: HEP, brace (AFO)  Plan: Patient agrees to discharge.  Patient goals were partially met. Patient is being discharged due to meeting the stated rehab goals.  ?????         Problem List Patient Active Problem List   Diagnosis Date Noted  . Lacunar infarct, acute ([place]) 12/13/2015  . Gait disturbance, post-stroke 12/02/2015  . Ataxia due to  recent stroke 12/02/2015  . Diabetes mellitus type 2  in nonobese (HCC)   . Hypoalbuminemia due to protein-calorie malnutrition ([place]) 10/15/2015  . Poorly controlled type 2 diabetes mellitus with peripheral neuropathy ([place]) 10/14/2015  . History of CVA with residual deficit 10/14/2015  . Hemiparesis affecting dominant side as late effect of stroke ([place]) 10/14/2015  . Depression 10/14/2015  . Basal ganglia infarction ([place]) 10/14/2015  . Acute ischemic stroke ([place])   . Left-sided weakness 10/12/2015  . Abnormal CXR 10/12/2015  . BPH associated with nocturia 08/20/2015  . Aftercare following surgery of the circulatory system, [place] 02/13/2014  . Postop check 02/06/2014  . Left inguinal hernia 12/28/2013  . Rib fractures 10/13/2013  . Fall 10/13/2013  . Dysplastic nevus of trunk 02/14/2013  . Bicipital tendinitis of right shoulder 07/12/2012  . Subacromial bursitis, right 03/28/2012  . Right spastic hemiparesis ([place]) 02/02/2012  . Occlusion and stenosis of carotid artery with cerebral infarction 01/26/2012  . Hemiplegia affecting right dominant side ([place]) 12/24/2011  . CVA (cerebral infarction) 12/15/2011  . Occlusion and stenosis of carotid artery without mention of cerebral infarction 12/08/2011  . Carotid artery plaque 12/04/2011  . Left carotid bruit 11/30/2011  . AUTONOMIC NEUROPATHY, DIABETIC 11/25/2010  . Hyperlipidemia 03/15/2008  . Obstructive sleep apnea 07/27/2007  . Diabetes 1.5, managed as type 2 ([place]) 06/15/2007  . Essential hypertension 06/15/2007    Cameron Sprang, PT, MPT [facility] [address] [place], [place], [address] Phone: [phone]   Fax:  [phone] 12/17/2015, 10:14 AM  Name: Darrell [name] MRN: [id number] Date of Birth: Sep 15, 1946

## 2015-12-19 ENCOUNTER — Encounter: Payer: Medicare Other | Admitting: Occupational Therapy

## 2015-12-19 ENCOUNTER — Ambulatory Visit: Payer: Medicare Other | Admitting: Rehabilitation

## 2015-12-25 ENCOUNTER — Encounter: Payer: Medicare Other | Admitting: *Deleted

## 2015-12-25 ENCOUNTER — Ambulatory Visit: Payer: Medicare Other | Admitting: Physical Therapy

## 2015-12-27 ENCOUNTER — Ambulatory Visit: Payer: Medicare Other | Admitting: Rehabilitation

## 2015-12-27 ENCOUNTER — Encounter: Payer: Medicare Other | Admitting: *Deleted

## 2015-12-30 ENCOUNTER — Ambulatory Visit: Payer: Medicare Other | Admitting: Rehabilitation

## 2015-12-30 ENCOUNTER — Encounter: Payer: Medicare Other | Admitting: Occupational Therapy

## 2015-12-31 ENCOUNTER — Encounter: Payer: Self-pay | Admitting: Physical Medicine & Rehabilitation

## 2015-12-31 ENCOUNTER — Ambulatory Visit (HOSPITAL_BASED_OUTPATIENT_CLINIC_OR_DEPARTMENT_OTHER): Payer: Medicare Other | Admitting: Physical Medicine & Rehabilitation

## 2015-12-31 ENCOUNTER — Encounter: Payer: Medicare Other | Attending: Physical Medicine & Rehabilitation

## 2015-12-31 ENCOUNTER — Telehealth: Payer: Self-pay | Admitting: *Deleted

## 2015-12-31 VITALS — BP 143/81 | HR 70 | Resp 14

## 2015-12-31 DIAGNOSIS — Z87891 Personal history of nicotine dependence: Secondary | ICD-10-CM | POA: Diagnosis not present

## 2015-12-31 DIAGNOSIS — I69393 Ataxia following cerebral infarction: Secondary | ICD-10-CM | POA: Diagnosis not present

## 2015-12-31 DIAGNOSIS — R269 Unspecified abnormalities of gait and mobility: Secondary | ICD-10-CM | POA: Insufficient documentation

## 2015-12-31 DIAGNOSIS — I69398 Other sequelae of cerebral infarction: Secondary | ICD-10-CM

## 2015-12-31 DIAGNOSIS — I10 Essential (primary) hypertension: Secondary | ICD-10-CM | POA: Diagnosis not present

## 2015-12-31 DIAGNOSIS — G629 Polyneuropathy, unspecified: Secondary | ICD-10-CM | POA: Insufficient documentation

## 2015-12-31 DIAGNOSIS — G4733 Obstructive sleep apnea (adult) (pediatric): Secondary | ICD-10-CM | POA: Insufficient documentation

## 2015-12-31 DIAGNOSIS — I639 Cerebral infarction, unspecified: Secondary | ICD-10-CM

## 2015-12-31 DIAGNOSIS — I69359 Hemiplegia and hemiparesis following cerebral infarction affecting unspecified side: Secondary | ICD-10-CM

## 2015-12-31 DIAGNOSIS — E1342 Other specified diabetes mellitus with diabetic polyneuropathy: Secondary | ICD-10-CM | POA: Diagnosis not present

## 2015-12-31 MED ORDER — LISINOPRIL 20 MG PO TABS: 20.0000 mg | ORAL_TABLET | Freq: Two times a day (BID) | ORAL | Status: DC

## 2015-12-31 NOTE — Telephone Encounter (Signed)
Wife is aware and a refill sent.

## 2015-12-31 NOTE — Progress Notes (Signed)
Subjective:    Patient ID: Darrell [name], male    DOB: 1946-09-01, 70 y.o.   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 He was admitted to inpatient rehabilitation from 1219 through 10/26/2015 and is now attending outpatient therapy. According to his wife he is doing a little bit better but is not back to his prior baseline. At one point he was walking up to an hour a day outside.  HPI Currently 37min every other day.  Now has Right AFO which he likes Pain Inventory Average Pain 0 Pain Right Now 0 My pain is no pain  In the last 24 hours, has pain interfered with the following? General activity 0 Relation with others 0 Enjoyment of life 0 What TIME of day is your pain at its worst? no pain Sleep (in general) Good  Pain is worse with: no pain Pain improves with: no pain Relief from Meds: no pain  Mobility walk without assistance how many minutes can you walk? 20 ability to climb steps?  no do you drive?  no  Function retired I need assistance with the following:  dressing, bathing, meal prep, household duties and shopping  Neuro/Psych weakness trouble walking confusion  Prior Studies Any changes since last visit?  no  Physicians involved in your care Any changes since last visit?  no   Family History  Problem Relation Age of Onset  . Heart disease Father   . Colon cancer Father     at age 19  . Esophageal cancer Neg Hx   . Rectal cancer Neg Hx   . Stomach cancer Neg Hx    Social History   Social History  . Marital Status: Married    Spouse Name: N/A  . Number of Children: N/A  . Years of Education: N/A   Social History Main Topics  . Smoking status: Former Smoker -- 2.50 packs/day for 30 years    Types: Cigarettes    Quit date: 12/07/2001  . Smokeless tobacco: Never Used  . Alcohol Use: No  . Drug Use: No  . Sexual  Activity: Not Asked   Other Topics Concern  . None   Social History Narrative   Past Surgical History  Procedure Laterality Date  . Heel spur surgery    . [place] surgery    . Endarterectomy  12/09/2011    Procedure: ENDARTERECTOMY CAROTID;  Surgeon: Tinnie Gens, MD;  Location: [facility] OR;  Service: Vascular;  Laterality: Left;  Left Carotid endarterectomy with dacron patch angioplasty  . Carotid endarterectomy  12/09/11    Left  . Hernia repair      x2  . Ankle surgery    . Inguinal hernia repair Left 01/22/2014    Procedure: LEFT INGUINAL HERNIA REPAIR WITH MESH;  Surgeon: [name], MD;  Location: [place];  Service: General;  Laterality: Left;  . Insertion of mesh Left 01/22/2014    Procedure: INSERTION OF MESH;  Surgeon: [name], MD;  Location: [place];  Service: General;  Laterality: Left;  . Colonoscopy     Past Medical History  Diagnosis Date  . Diabetes mellitus   . Hypertension     dr Sherren Mocha  . Sleep apnea     cpap      sleep study > 4 yrs; no longer wearing CPAP  . Stroke [facility])     rt side  weakness, 2013  . Poorly controlled type 2 diabetes mellitus with peripheral neuropathy ([place]) 10/14/2015  . History of CVA with residual deficit 10/14/2015   BP 143/81 mmHg  Pulse 70  Resp 14  SpO2 95%  Opioid Risk Score:   Fall Risk Score:  `1  Depression screen PHQ 2/9  Depression screen [facility] 2/9 12/02/2015 08/20/2015 02/20/2014 12/28/2013 12/28/2013  Decreased Interest 0 1 0 0 -  Down, Depressed, Hopeless 0 1 0 0 1  PHQ - 2 Score 0 2 0 0 1  Altered sleeping 0 - - - -  Tired, decreased energy 3 - - - -  Change in appetite 0 - - - -  Feeling bad or failure about yourself  0 - - - -  Trouble concentrating 0 - - - -  Moving slowly or fidgety/restless 0 - - - -  Suicidal thoughts 0 - - - -  PHQ-9 Score 3 - - - -  Difficult doing work/chores Somewhat difficult - - - -     Review of Systems  All other systems reviewed and are negative.      Objective:   Physical Exam    Constitutional: He appears well-developed and well-nourished.  HENT:  Head: Normocephalic and atraumatic.  Eyes: Conjunctivae and EOM are normal. Pupils are equal, round, and reactive to light.  Neurological: He is alert.  2- RUE 4- R HF, KE, 3- ADF Left side is now 5/5  Psychiatric: His affect is blunt. His speech is delayed. He is slowed.  Nursing note and vitals reviewed.  Gait no toe drag with AFO no AD       Assessment & Plan:  1.  Chronic R HP, cognitive deficits related to R MCA/PCA infarct 2013 with good recovery of left hemiparesis from CVA in Dec 2016 No need for continued PT, OT, remaining deficits are chronic F/u PMR prn

## 2015-12-31 NOTE — Patient Instructions (Signed)
Agree with slowly increasing walking time every 2 days with goal of 20min every other day

## 2015-12-31 NOTE — Telephone Encounter (Signed)
Wife is calling with blood pressure readings of high 158/101, low 119/82, average 140/90. Per Dr Sherren Mocha patient should increase his lisinopril to 20 mg twice daily. Left message on machine for wife to return my call.

## 2016-01-01 ENCOUNTER — Ambulatory Visit: Payer: Medicare Other | Admitting: Physical Therapy

## 2016-01-01 ENCOUNTER — Encounter: Payer: Medicare Other | Admitting: Occupational Therapy

## 2016-01-02 ENCOUNTER — Encounter: Payer: Medicare Other | Admitting: Occupational Therapy

## 2016-01-06 ENCOUNTER — Encounter: Payer: Medicare Other | Admitting: Occupational Therapy

## 2016-01-06 ENCOUNTER — Ambulatory Visit: Payer: Medicare Other | Admitting: Rehabilitation

## 2016-01-09 ENCOUNTER — Encounter: Payer: Medicare Other | Admitting: Occupational Therapy

## 2016-01-09 ENCOUNTER — Ambulatory Visit: Payer: Medicare Other | Admitting: Rehabilitation

## 2016-02-11 ENCOUNTER — Other Ambulatory Visit: Payer: Medicare Other

## 2016-02-19 ENCOUNTER — Encounter: Payer: Medicare Other | Admitting: Family Medicine

## 2016-02-24 ENCOUNTER — Other Ambulatory Visit (INDEPENDENT_AMBULATORY_CARE_PROVIDER_SITE_OTHER): Payer: Medicare Other

## 2016-02-24 DIAGNOSIS — Z0000 Encounter for general adult medical examination without abnormal findings: Secondary | ICD-10-CM

## 2016-02-24 LAB — LIPID PANEL
CHOL/HDL RATIO: 4
Cholesterol: 134 mg/dL (ref 0–200)
HDL: 31.5 mg/dL — ABNORMAL LOW (ref >39.00)
LDL CALC: 90 mg/dL (ref 0–99)
NonHDL: 102.22
TRIGLYCERIDES: 59 mg/dL (ref 0.0–149.0)
VLDL: 11.8 mg/dL (ref 0.0–40.0)

## 2016-02-24 LAB — BASIC METABOLIC PANEL
BUN: 16 mg/dL (ref 6–23)
CHLORIDE: 103 meq/L (ref 96–112)
CO2: 30 mEq/L (ref 19–32)
Calcium: 9 mg/dL (ref 8.4–10.5)
Creatinine, Ser: 0.7 mg/dL (ref 0.40–1.50)
GFR: 118.15 mL/min (ref >60.00)
Glucose, Bld: 118 mg/dL — ABNORMAL HIGH (ref 70–99)
POTASSIUM: 4.1 meq/L (ref 3.5–5.1)
SODIUM: 139 meq/L (ref 135–145)

## 2016-02-24 LAB — CBC WITH DIFFERENTIAL/PLATELET
Basophils Absolute: 0 10*3/uL (ref 0.0–0.1)
Basophils Relative: 0.2 % (ref 0.0–3.0)
EOS ABS: 0.2 10*3/uL (ref 0.0–0.7)
Eosinophils Relative: 3 % (ref 0.0–5.0)
HCT: 44.3 % (ref 39.0–52.0)
HEMOGLOBIN: 14.9 g/dL (ref 13.0–17.0)
Lymphocytes Relative: 20.5 % (ref 12.0–46.0)
Lymphs Abs: 1.6 10*3/uL (ref 0.7–4.0)
MCHC: 33.6 g/dL (ref 30.0–36.0)
MCV: 89 fl (ref 78.0–100.0)
MONO ABS: 0.7 10*3/uL (ref 0.1–1.0)
Monocytes Relative: 8.9 % (ref 3.0–12.0)
Neutro Abs: 5.3 10*3/uL (ref 1.4–7.7)
Neutrophils Relative %: 67.4 % (ref 43.0–77.0)
Platelets: 164 10*3/uL (ref 150.0–400.0)
RBC: 4.98 Mil/uL (ref 4.22–5.81)
RDW: 13.8 % (ref 11.5–15.5)
WBC: 7.9 10*3/uL (ref 4.0–10.5)

## 2016-02-24 LAB — MICROALBUMIN / CREATININE URINE RATIO
CREATININE, U: 184.8 mg/dL
Microalb Creat Ratio: 0.5 mg/g (ref 0.0–30.0)
Microalb, Ur: 1 mg/dL (ref 0.0–1.9)

## 2016-02-24 LAB — HEPATIC FUNCTION PANEL
ALT: 14 U/L (ref 0–53)
AST: 13 U/L (ref 0–37)
Albumin: 3.9 g/dL (ref 3.5–5.2)
Alkaline Phosphatase: 36 U/L — ABNORMAL LOW (ref 39–117)
BILIRUBIN DIRECT: 0.1 mg/dL (ref 0.0–0.3)
BILIRUBIN TOTAL: 0.6 mg/dL (ref 0.2–1.2)
Total Protein: 6.3 g/dL (ref 6.0–8.3)

## 2016-02-24 LAB — POC URINALSYSI DIPSTICK (AUTOMATED)
BILIRUBIN UA: NEGATIVE
GLUCOSE UA: NEGATIVE
Ketones, UA: NEGATIVE
Leukocytes, UA: NEGATIVE
NITRITE UA: NEGATIVE
Protein, UA: NEGATIVE
Spec Grav, UA: >=1.03
Urobilinogen, UA: 0.2
pH, UA: 5

## 2016-02-24 LAB — TSH: TSH: 1.6 u[IU]/mL (ref 0.35–4.50)

## 2016-02-24 LAB — PSA: PSA: 0.37 ng/mL (ref 0.10–4.00)

## 2016-02-24 LAB — HEMOGLOBIN A1C: HEMOGLOBIN A1C: 6.1 % (ref 4.6–6.5)

## 2016-02-27 ENCOUNTER — Other Ambulatory Visit: Payer: Self-pay | Admitting: Family Medicine

## 2016-02-27 ENCOUNTER — Other Ambulatory Visit: Payer: Self-pay | Admitting: *Deleted

## 2016-03-02 ENCOUNTER — Encounter: Payer: Self-pay | Admitting: Family Medicine

## 2016-03-02 ENCOUNTER — Ambulatory Visit (INDEPENDENT_AMBULATORY_CARE_PROVIDER_SITE_OTHER): Payer: Medicare Other | Admitting: Family Medicine

## 2016-03-02 VITALS — BP 130/90 | Temp 98.7°F | Ht 71.0 in | Wt 200.0 lb

## 2016-03-02 DIAGNOSIS — G8191 Hemiplegia, unspecified affecting right dominant side: Secondary | ICD-10-CM

## 2016-03-02 DIAGNOSIS — I10 Essential (primary) hypertension: Secondary | ICD-10-CM | POA: Diagnosis not present

## 2016-03-02 DIAGNOSIS — E139 Other specified diabetes mellitus without complications: Secondary | ICD-10-CM

## 2016-03-02 DIAGNOSIS — E785 Hyperlipidemia, unspecified: Secondary | ICD-10-CM

## 2016-03-02 DIAGNOSIS — I69398 Other sequelae of cerebral infarction: Secondary | ICD-10-CM

## 2016-03-02 DIAGNOSIS — R0989 Other specified symptoms and signs involving the circulatory and respiratory systems: Secondary | ICD-10-CM

## 2016-03-02 DIAGNOSIS — R269 Unspecified abnormalities of gait and mobility: Secondary | ICD-10-CM

## 2016-03-02 MED ORDER — METFORMIN HCL 1000 MG PO TABS: ORAL_TABLET | ORAL | Status: AC

## 2016-03-02 MED ORDER — LISINOPRIL 20 MG PO TABS: 20.0000 mg | ORAL_TABLET | Freq: Two times a day (BID) | ORAL | Status: DC

## 2016-03-02 MED ORDER — LISINOPRIL 30 MG PO TABS: ORAL_TABLET | ORAL | Status: DC

## 2016-03-02 NOTE — Progress Notes (Signed)
Pre visit review using our clinic review tool, if applicable. No additional management support is needed unless otherwise documented below in the visit note. 

## 2016-03-02 NOTE — Patient Instructions (Signed)
Metformin 1000 mg,,,,,,,,,, one tablet before breakfast,,,,,,,,, one half tab before your evening meal  Lisinopril 20 mg in the morning  Lisinopril 30 mg with your evening male  Follow-up A1c in 6 months

## 2016-03-02 NOTE — Progress Notes (Signed)
Subjective:    Patient ID: Darrell [name], male    DOB: [birth year], 70 y.o.   MRN: [id number]  HPI Darrell [name] is a 70 year old married male nonsmoker who comes in today for general physical examination because of a history of multiple issues  He has underlying hypertension. BP today 130/90. His blood pressures at home running [id number] systolic 95 diastolic. He's on lisinopril 20 mg twice a day. We'll increase his nighttime dose of lisinopril to 30 mg and continue the 20 mg dose in the morning. 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 He also takes an aspirin tablet.  He's on metformin 1000 mg twice a day for diabetes. Blood sugar 118 A1c 6.1%. I think we can probably decrease the metformin 2000 mg in the morning and 500 mg prior to his evening meal.  He had a second stroke back in the fall. He's recovered most of the neurologic deficits. He still has a right hemiparesis secondary to the left carotid occlusion. He had a bruit. Subsequent surgery went well however had a complication postop and that graft clotted. He had to have a redo which was done successfully every developed a prominent right hemiparesis.  He gets routine eye care, dental care, colonoscopy 2016 was reported to be normal  Social history.......... his life live here in [place] in a [place]. They sold their home after his first episode. The now plan to moving to [facility] which is near [facility]. I encouraged him to establish a primary and a neurologist in that area.  Vaccinations up-to-date  Cognitive function normal he exercises as much as he can because the deficit. Home health safety reviewed no issues identified, no guns in the house, he does have a healthcare power of attorney and living well   Review of Systems  Constitutional: Negative.   HENT: Negative.   Eyes: Negative.   Respiratory: Negative.   Cardiovascular: Negative.     Gastrointestinal: Negative.   Endocrine: Negative.   Genitourinary: Negative.   Musculoskeletal: Negative.   Skin: Negative.   Allergic/Immunologic: Negative.   Neurological: Negative.   Hematological: Negative.   Psychiatric/Behavioral: Negative.        Objective:   Physical Exam  Constitutional: He is oriented to person, place, and time. He appears well-developed and well-nourished.  HENT:  Head: Normocephalic and atraumatic.  Right Ear: External ear normal.  Left Ear: External ear normal.  Nose: Nose normal.  Mouth/Throat: Oropharynx is clear and moist.  Eyes: Conjunctivae and EOM are normal. Pupils are equal, round, and reactive to light.  Neck: Normal range of motion. Neck supple. No JVD present. No tracheal deviation present. No thyromegaly present.  Cardiovascular: Normal rate, regular rhythm, normal heart sounds and intact distal pulses.  Exam reveals no gallop and no friction rub.   No murmur heard. Pulmonary/Chest: Effort normal and breath sounds normal. No stridor. No respiratory distress. He has no wheezes. He has no rales. He exhibits no tenderness.  Abdominal: Soft. Bowel sounds are normal. He exhibits no distension and no mass. There is no tenderness. There is no rebound and no guarding.  Genitourinary: Rectum normal, prostate normal and penis normal. Guaiac negative stool. No penile tenderness.  Musculoskeletal: Normal range of motion. He exhibits no edema or tenderness.  Lymphadenopathy:    He has no cervical adenopathy.  Neurological: He is alert and oriented to person, place, and time. He has normal reflexes. No cranial  nerve deficit. He exhibits normal muscle tone.  Right hemiparesis  Skin: Skin is warm and dry. No rash noted. No erythema. No pallor.  Psychiatric: He has a normal mood and affect. His behavior is normal. Judgment and thought content normal.  Nursing note and vitals reviewed.         Assessment & Plan:  Diabetes type 2...........[name] much  improved control........ decrease metformin by 25%. 1000 mg in morning 500 mg prior to his evening meal  Hypertension not at goal....... 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 neurologically stable

## 2016-03-03 ENCOUNTER — Telehealth: Payer: Self-pay

## 2016-03-03 NOTE — Telephone Encounter (Signed)
Please clarify lisinopril directions.

## 2016-03-04 MED ORDER — LISINOPRIL 30 MG PO TABS: ORAL_TABLET | ORAL | Status: DC

## 2016-03-06 IMAGING — DX DG CHEST 2V
2 series · 2 of 2 positions shown · non-contrast
Comparison: Chest radiograph 10/13/2013.

CLINICAL DATA: Patient with weakness for 1 day.

EXAM:
CHEST  2 VIEW

[x chest ap]
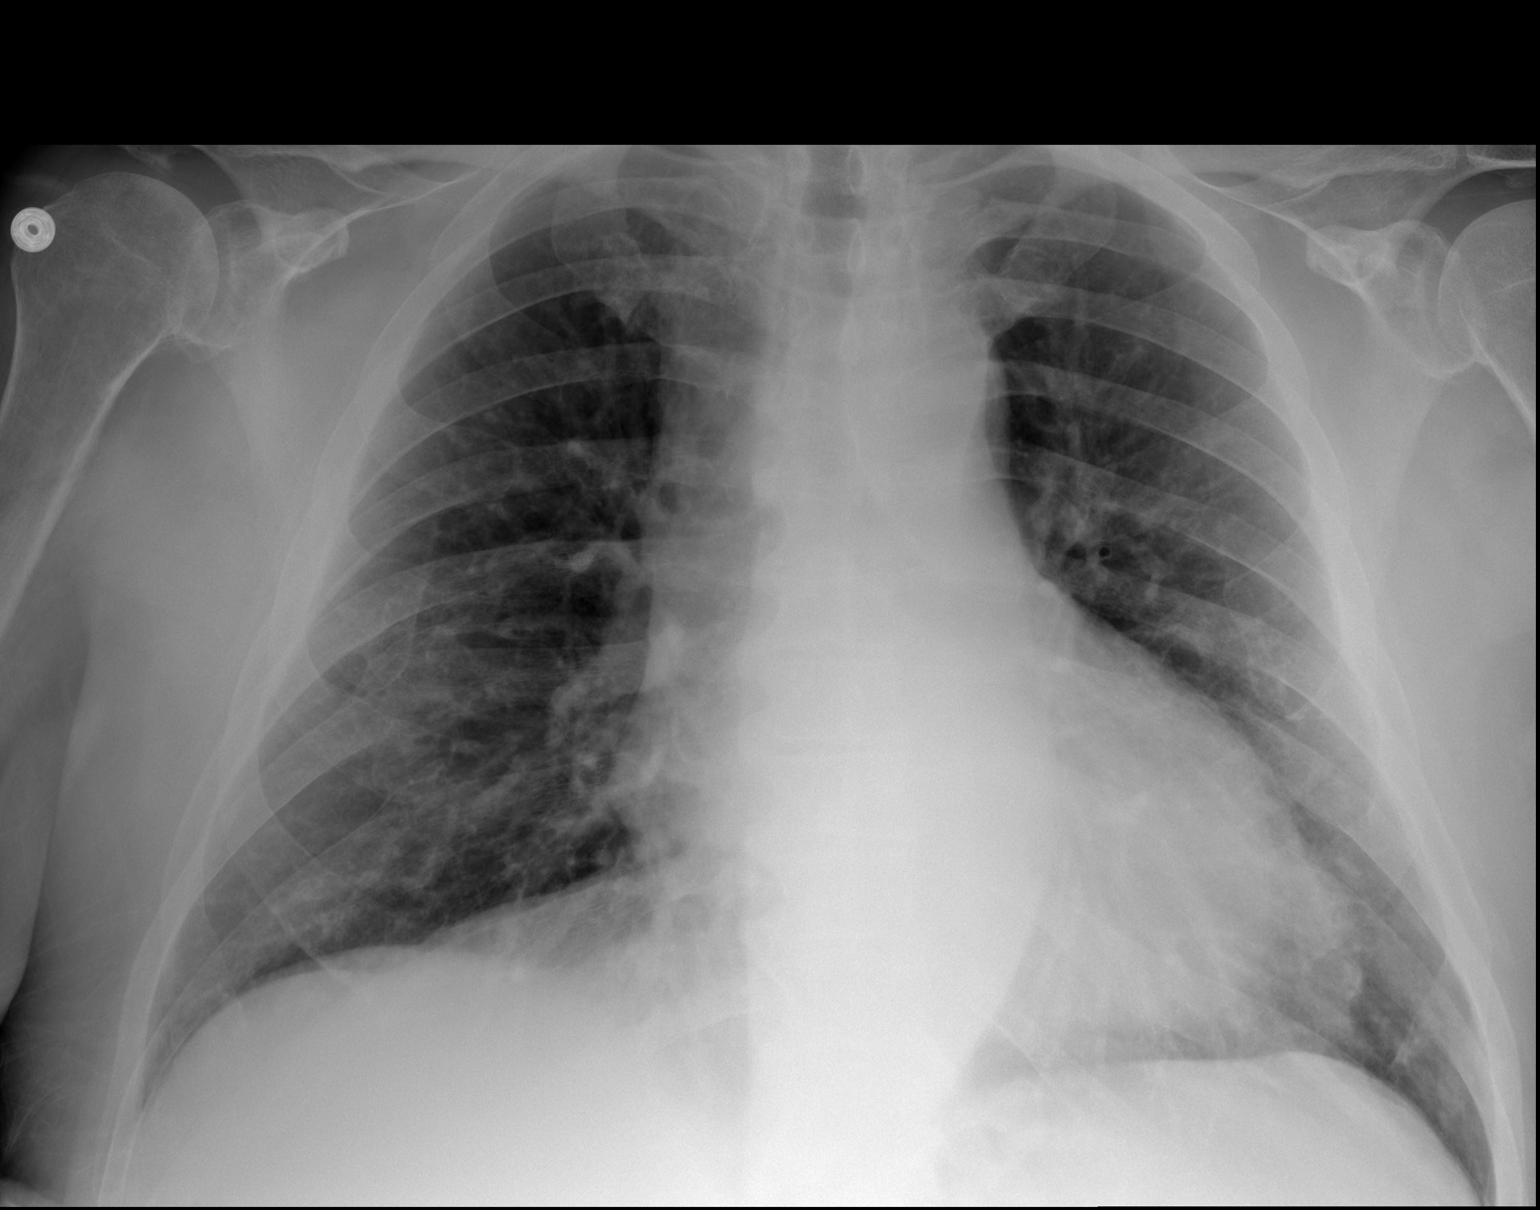

[w chest lat]
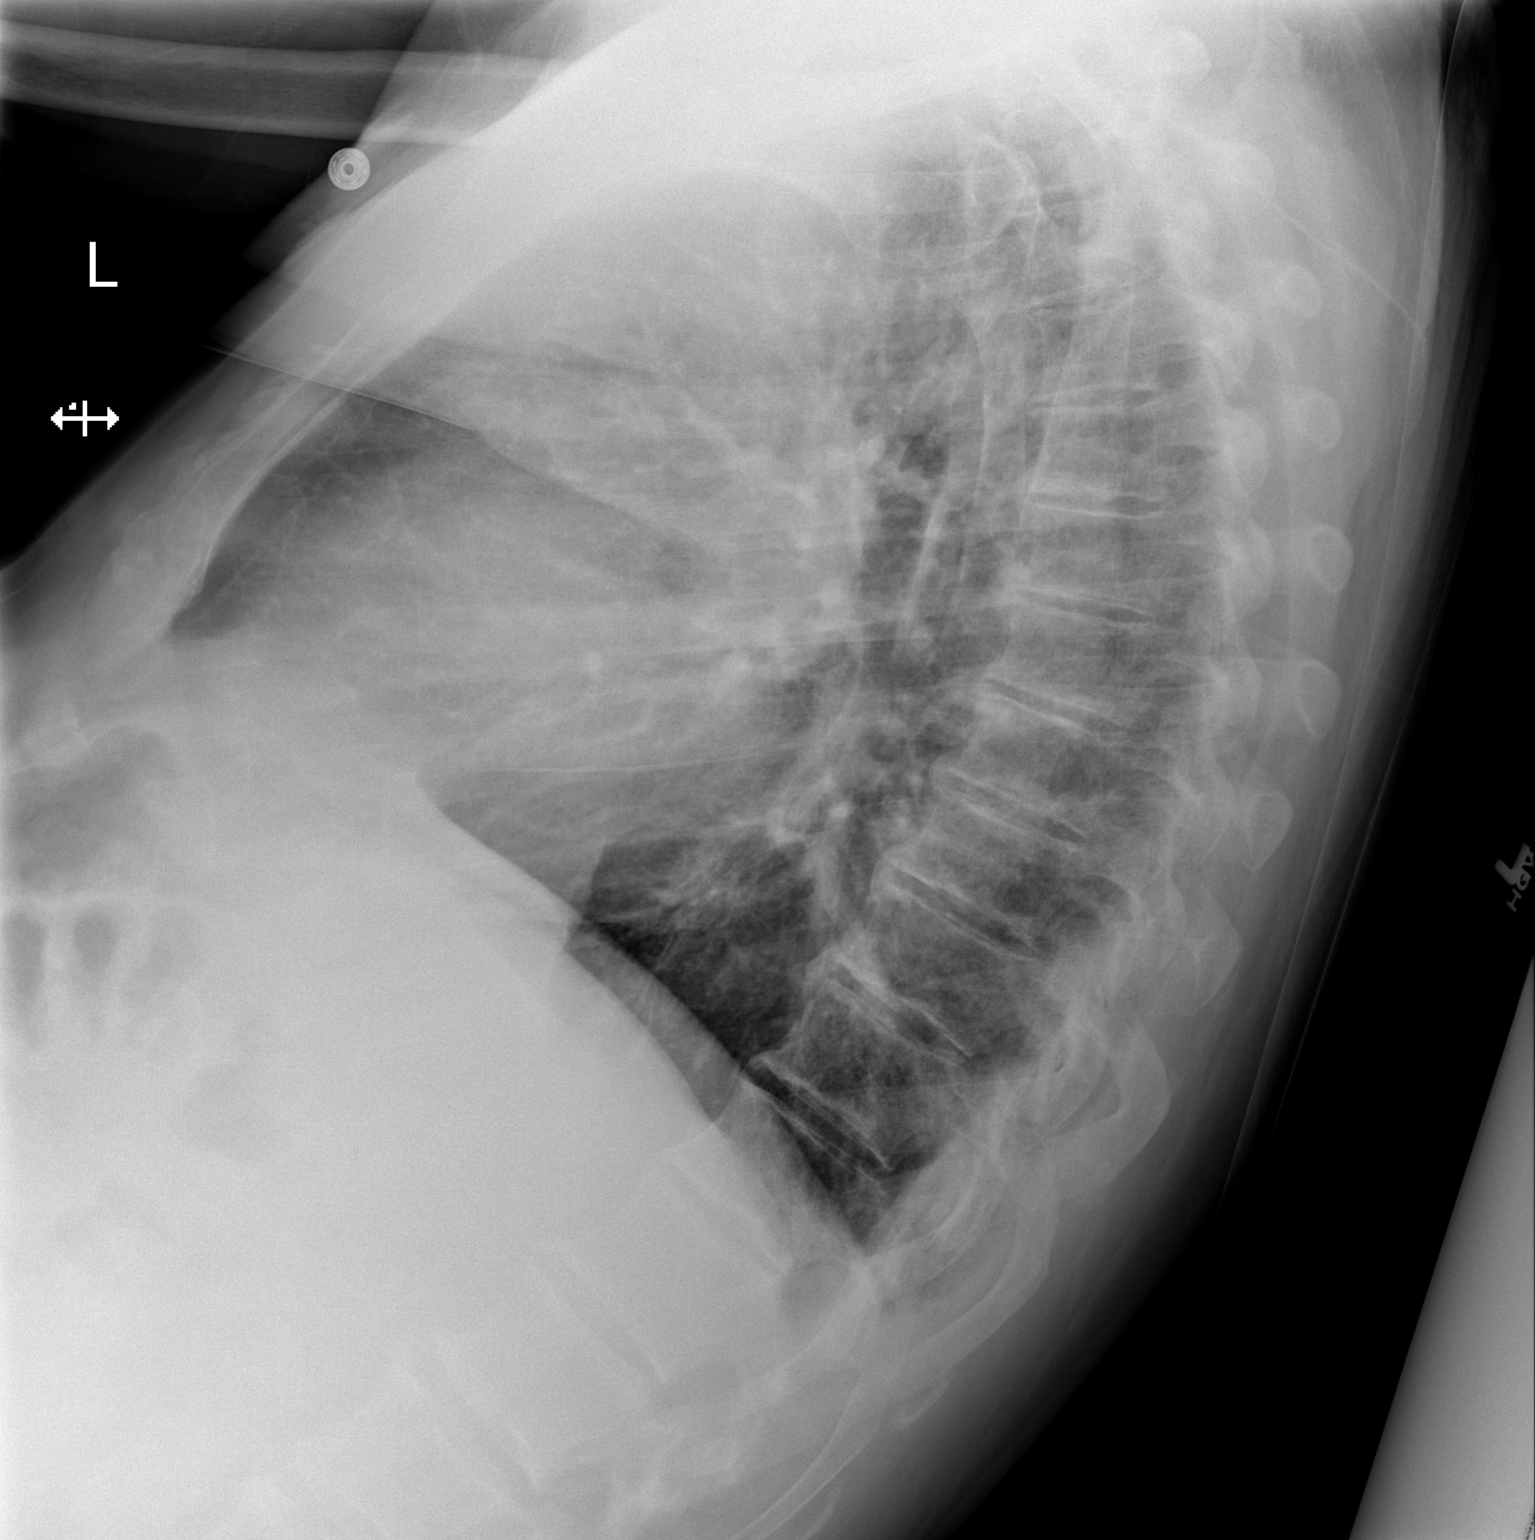

[2 of 2 positions shown; findings below may reference images not displayed]

FINDINGS: Stable enlarged cardiac and mediastinal contours. Pulmonary vascular
redistribution. No large area of pulmonary consolidation. No pleural
effusion or pneumothorax. Probable healing posterior right eighth
and ninth rib fractures. Thoracic spine degenerative changes.
IMPRESSION: Cardiomegaly with pulmonary vascular redistribution.

Probable healing posterior right rib fractures.

## 2016-03-13 DIAGNOSIS — B353 Tinea pedis: Secondary | ICD-10-CM | POA: Diagnosis not present

## 2016-03-13 DIAGNOSIS — Z85828 Personal history of other malignant neoplasm of skin: Secondary | ICD-10-CM | POA: Diagnosis not present

## 2016-03-13 DIAGNOSIS — L723 Sebaceous cyst: Secondary | ICD-10-CM | POA: Diagnosis not present

## 2016-03-13 DIAGNOSIS — L853 Xerosis cutis: Secondary | ICD-10-CM | POA: Diagnosis not present

## 2016-03-13 DIAGNOSIS — C44222 Squamous cell carcinoma of skin of right ear and external auricular canal: Secondary | ICD-10-CM | POA: Diagnosis not present

## 2016-03-13 DIAGNOSIS — D485 Neoplasm of uncertain behavior of skin: Secondary | ICD-10-CM | POA: Diagnosis not present

## 2016-03-13 DIAGNOSIS — B351 Tinea unguium: Secondary | ICD-10-CM | POA: Diagnosis not present

## 2016-03-13 DIAGNOSIS — D225 Melanocytic nevi of trunk: Secondary | ICD-10-CM | POA: Diagnosis not present

## 2016-03-13 DIAGNOSIS — L570 Actinic keratosis: Secondary | ICD-10-CM | POA: Diagnosis not present

## 2016-03-24 ENCOUNTER — Encounter: Payer: Self-pay | Admitting: Vascular Surgery

## 2016-03-30 ENCOUNTER — Ambulatory Visit: Payer: Medicare Other | Admitting: Family

## 2016-03-30 ENCOUNTER — Inpatient Hospital Stay (HOSPITAL_COMMUNITY)
Admission: RE | Admit: 2016-03-30 | Discharge: 2016-03-30 | Disposition: A | Discharge status: Home / Self Care | Payer: Medicare Other | Source: Ambulatory Visit | Attending: Family | Admitting: Family

## 2016-03-30 DIAGNOSIS — Z48812 Encounter for surgical aftercare following surgery on the circulatory system: Secondary | ICD-10-CM

## 2016-03-30 DIAGNOSIS — Z87891 Personal history of nicotine dependence: Secondary | ICD-10-CM

## 2016-04-01 DIAGNOSIS — E119 Type 2 diabetes mellitus without complications: Secondary | ICD-10-CM | POA: Diagnosis not present

## 2016-04-01 DIAGNOSIS — H2513 Age-related nuclear cataract, bilateral: Secondary | ICD-10-CM | POA: Diagnosis not present

## 2016-04-01 DIAGNOSIS — H40053 Ocular hypertension, bilateral: Secondary | ICD-10-CM | POA: Diagnosis not present

## 2016-04-01 LAB — HM DIABETES EYE EXAM

## 2016-04-10 DIAGNOSIS — B354 Tinea corporis: Secondary | ICD-10-CM | POA: Diagnosis not present

## 2016-04-10 DIAGNOSIS — Z79899 Other long term (current) drug therapy: Secondary | ICD-10-CM | POA: Diagnosis not present

## 2016-04-14 ENCOUNTER — Encounter: Payer: Self-pay | Admitting: Family Medicine

## 2016-04-20 ENCOUNTER — Ambulatory Visit (INDEPENDENT_AMBULATORY_CARE_PROVIDER_SITE_OTHER): Payer: Medicare Other | Admitting: Family Medicine

## 2016-04-20 ENCOUNTER — Encounter: Payer: Self-pay | Admitting: Family Medicine

## 2016-04-20 VITALS — BP 152/98 | HR 86 | Temp 98.8°F | Resp 16 | Ht 71.0 in | Wt 205.0 lb

## 2016-04-20 DIAGNOSIS — I639 Cerebral infarction, unspecified: Secondary | ICD-10-CM | POA: Diagnosis not present

## 2016-04-20 DIAGNOSIS — R05 Cough: Secondary | ICD-10-CM

## 2016-04-20 DIAGNOSIS — J040 Acute laryngitis: Secondary | ICD-10-CM

## 2016-04-20 DIAGNOSIS — H109 Unspecified conjunctivitis: Secondary | ICD-10-CM

## 2016-04-20 DIAGNOSIS — I10 Essential (primary) hypertension: Secondary | ICD-10-CM

## 2016-04-20 DIAGNOSIS — J069 Acute upper respiratory infection, unspecified: Secondary | ICD-10-CM

## 2016-04-20 DIAGNOSIS — R059 Cough, unspecified: Secondary | ICD-10-CM

## 2016-04-20 MED ORDER — ERYTHROMYCIN 5 MG/GM OP OINT: 1.0000 "application " | TOPICAL_OINTMENT | Freq: Every day | OPHTHALMIC | Status: AC

## 2016-04-20 MED ORDER — BENZONATATE 100 MG PO CAPS: 100.0000 mg | ORAL_CAPSULE | Freq: Two times a day (BID) | ORAL | Status: AC

## 2016-04-20 MED ORDER — HYDROCODONE-HOMATROPINE 5-1.5 MG/5ML PO SYRP
5.0000 mL | ORAL_SOLUTION | Freq: Every evening | ORAL | Status: AC | PRN
Start: 2016-04-20 — End: 2016-04-29

## 2016-04-20 NOTE — Patient Instructions (Signed)
A few things to remember from today's visit:   1. Essential hypertension  Re-checked 145/80. No cold meds.  2. URI, acute   3. Laryngitis, acute  Voice rest.  4. Cough  - HYDROcodone-homatropine (HYCODAN) 5-1.5 MG/5ML syrup; Take 5 mLs by mouth at bedtime as needed for cough.  Dispense: 120 mL; Refill: 0 - benzonatate (TESSALON) 100 MG capsule; Take 1 capsule (100 mg total) by mouth 2 (two) times daily.  Dispense: 30 capsule; Refill: 0   viral infections are self-limited and we treat each symptom depending of severity.  Over the counter medications as decongestants and cold medications usually help, but not recommended.  Tylenol also helps with most symptoms (headache, muscle aching, fever,etc) Plenty of fluids. Honey helps with cough. Steam inhalations helps with runny nose, nasal congestion, and may prevent sinus infections. Cough and nasal congestion could last a few days and sometimes weeks. Please follow in not any better in 1-2 weeks or if symptoms get worse.

## 2016-04-20 NOTE — Progress Notes (Signed)
HPI:  ACUTE VISIT:  Chief Complaint  Patient presents with  . Cough    bad cough, pt can't speak. Pt coughs all night.  . cold    eyes crust over, runny nose.     [name] is a 70 y.o.male here today with his wife complaining of 3-4 days of respiratory symptoms.  Non productive cough that keeps him for sleep and dysphonia. No Hx of heartburn or GERD.  He has not noted chest pain, dyspnea, or wheezing. Subjective fever. + Nasal congestion, rhinorrhea, and epiphora. Crusty eye drainage in the morning, no visual changes, mild pruritus. No Hx of recent travel. Positive for sick contact, grandchild with pink eye. No known insect bite. No Hx of allergies.  He has tried OTC Robitussin DM and antihistaminic, the latter one cause drowsiness, so he discontinued. Symptoms otherwise stable.   Denies abdominal pain, nausea, vomiting, changes in bowel habits, or MS changes.  Hx of DM II, FG 130's, usually his are in the 110's. Hx of CVA with residual right-sided deficit.     Review of Systems  Constitutional: Positive for fever and fatigue. Negative for chills, activity change and appetite change.  HENT: Positive for congestion, postnasal drip, rhinorrhea and voice change. Negative for ear pain, mouth sores, sore throat and trouble swallowing.   Eyes: Positive for discharge and redness. Negative for photophobia, pain, itching and visual disturbance.  Respiratory: Positive for cough. Negative for chest tightness, shortness of breath, wheezing and stridor.   Cardiovascular: Negative for chest pain, palpitations and leg swelling.  Gastrointestinal: Negative for nausea, vomiting, abdominal pain and diarrhea.  Musculoskeletal: Negative for myalgias and neck pain.       No associated arthralgias.  Skin: Negative for color change and rash.  Neurological: Negative for syncope and headaches.  Hematological: Negative for adenopathy. Does not bruise/bleed easily.       Current Outpatient Prescriptions on File Prior to Visit  Medication Sig Dispense Refill  . aspirin EC 325 MG tablet Take 1 tablet (325 mg total) by mouth daily. 30 tablet 0  . citalopram (CELEXA) 10 MG tablet Take 1 tablet (10 mg total) by mouth daily. 100 tablet 3  . latanoprost (XALATAN) 0.005 % ophthalmic solution Place 1 drop into both eyes at bedtime. 2.5 mL 0  . lisinopril (PRINIVIL,ZESTRIL) 20 MG tablet Take 1 tablet (20 mg total) by mouth 2 (two) times daily. 1 tablet in the morning 100 tablet 3  . lisinopril (PRINIVIL,ZESTRIL) 30 MG tablet 1 tablet with your evening meal 100 tablet 3  . metFORMIN (GLUCOPHAGE) 1000 MG tablet take 1 tablet with breakfast, one half tablet with your evening meal 200 tablet 3  . Multiple Vitamins-Minerals (RA CENTRAL-VITE) TABS take 1 tablet by mouth once daily 130 tablet 3  . ONE TOUCH ULTRA TEST test strip TEST once daily 100 each PRN   No current facility-administered medications on file prior to visit.     Past Medical History  Diagnosis Date  . Diabetes mellitus   . Hypertension     dr Sherren Mocha  . Sleep apnea     cpap      sleep study > 4 yrs; no longer wearing CPAP  . Stroke [facility])     rt side weakness, 2013  . Poorly controlled type 2 diabetes mellitus with peripheral neuropathy ([place]) 10/14/2015  . History of CVA with residual deficit 10/14/2015   Allergies  Allergen Reactions  . Oxycodone-Acetaminophen  REACTION: feels sick (patient reports taking acetaminophen without issue)     Social History   Social History  . Marital Status: Married    Spouse Name: N/A  . Number of Children: N/A  . Years of Education: N/A   Social History Main Topics  . Smoking status: Former Smoker -- 2.50 packs/day for 30 years    Types: Cigarettes    Quit date: 12/07/2001  . Smokeless tobacco: Never Used  . Alcohol Use: No  . Drug Use: No  . Sexual Activity: Not Asked   Other Topics Concern  . None   Social History Narrative     Filed Vitals:   04/20/16 1433  BP: 152/98  Pulse: 86  Temp: 98.8 F (37.1 C)  Resp: 16   Body mass index is 28.6 kg/(m^2).   SpO2 Readings from Last 3 Encounters:  04/20/16 97%  12/31/15 95%  12/02/15 95%      Physical Exam  Constitutional: He is oriented to person, place, and time. He appears well-developed. He does not appear ill. No distress.  HENT:  Head: Atraumatic.  Right Ear: Tympanic membrane, external ear and ear canal normal.  Left Ear: Tympanic membrane, external ear and ear canal normal.  Nose: Rhinorrhea present. No mucosal edema. Right sinus exhibits no maxillary sinus tenderness and no frontal sinus tenderness. Left sinus exhibits no maxillary sinus tenderness and no frontal sinus tenderness.  Mouth/Throat: Oropharynx is clear and moist and mucous membranes are normal.  Eyes: EOM are normal. Pupils are equal, round, and reactive to light. Right conjunctiva is injected. Left conjunctiva is injected.  Epiphora bilateral. Crusty yellowish discharge on eye lashes.   Cardiovascular: Normal rate and regular rhythm.   Pulses:      Dorsalis pedis pulses are 2+ on the right side, and 2+ on the left side.  Respiratory: Effort normal and breath sounds normal. No respiratory distress. He has no wheezes. He has no rales.  Non productive cough a couple times during OV.  Lymphadenopathy:    He has no cervical adenopathy.  Neurological: He is alert and oriented to person, place, and time.  Right-sided weakness, s/p CVA.Gait assisted with cane.  Skin: Skin is warm. No erythema.  Psychiatric: He has a normal mood and affect. His speech is normal.      ASSESSMENT AND PLAN:     Rayson was seen today for cough and cold.  Diagnoses and all orders for this visit:  URI, acute  Symptoms suggests a viral etiology, I explained patient that symptomatic treatment is usually recommended in this case, so I do not think abx is needed at this time. Instructed to monitor  for signs of complications, including new onset of fever among some, clearly instructed about warning signs. I also explained that cough and nasal congestion can last a few days and sometimes weeks. CXR is available and could be done today, he prefers to hold on imaging. F/U in 5-7 days or as needed.   Laryngitis, acute  Explained that it is most likely viral. Voice rest recommended. Instructed about warning signs.  Cough  He has Oxycodone allergy listed. He states that he did not have an allergy but it caused drowsiness.  Hycodan recommended at night and Benzonatate am and noon. Some side effects from medications discussed.   -     HYDROcodone-homatropine (HYCODAN) 5-1.5 MG/5ML syrup; Take 5 mLs by mouth at bedtime as needed for cough. -     benzonatate (TESSALON) 100 MG capsule; Take 1  capsule (100 mg total) by mouth 2 (two) times daily.  Bilateral conjunctivitis Possible etiologies discussed,  because + hx of contact, abx recommended. Hx of glaucoma, he can continue treatment. Avoid nasal steroid medications.   -     erythromycin ophthalmic ointment; Place 1 application into both eyes at bedtime.  Essential hypertension  Re-checked 145/80. Avoid OTC cold meds or decongestants. Monitor at home. Follow up with PCP.          -Mr. [name] was advised to return or notify a doctor immediately if symptoms worsen or persist or new concerns arise, he and his wife voice understanding.       Betty G. Martinique, MD  [facility]. [place] office.

## 2016-04-20 NOTE — Progress Notes (Signed)
Pre visit review using our clinic review tool, if applicable. No additional management support is needed unless otherwise documented below in the visit note. 

## 2016-05-10 ENCOUNTER — Other Ambulatory Visit: Payer: Self-pay | Admitting: Family Medicine

## 2016-05-28 DIAGNOSIS — I10 Essential (primary) hypertension: Secondary | ICD-10-CM | POA: Diagnosis not present

## 2016-05-28 DIAGNOSIS — Z1389 Encounter for screening for other disorder: Secondary | ICD-10-CM | POA: Diagnosis not present

## 2016-05-28 DIAGNOSIS — Z9889 Other specified postprocedural states: Secondary | ICD-10-CM | POA: Diagnosis not present

## 2016-05-28 DIAGNOSIS — F331 Major depressive disorder, recurrent, moderate: Secondary | ICD-10-CM | POA: Diagnosis not present

## 2016-05-28 DIAGNOSIS — E1159 Type 2 diabetes mellitus with other circulatory complications: Secondary | ICD-10-CM | POA: Diagnosis not present

## 2016-06-05 DIAGNOSIS — Z23 Encounter for immunization: Secondary | ICD-10-CM | POA: Diagnosis not present

## 2016-06-12 ENCOUNTER — Ambulatory Visit (INDEPENDENT_AMBULATORY_CARE_PROVIDER_SITE_OTHER): Payer: Medicare Other | Admitting: Nurse Practitioner

## 2016-06-12 ENCOUNTER — Encounter: Payer: Self-pay | Admitting: Nurse Practitioner

## 2016-06-12 VITALS — BP 120/76 | HR 76 | Ht 71.0 in | Wt 199.2 lb

## 2016-06-12 DIAGNOSIS — I69398 Other sequelae of cerebral infarction: Secondary | ICD-10-CM | POA: Diagnosis not present

## 2016-06-12 DIAGNOSIS — I69359 Hemiplegia and hemiparesis following cerebral infarction affecting unspecified side: Secondary | ICD-10-CM | POA: Diagnosis not present

## 2016-06-12 DIAGNOSIS — I69393 Ataxia following cerebral infarction: Secondary | ICD-10-CM

## 2016-06-12 DIAGNOSIS — R269 Unspecified abnormalities of gait and mobility: Secondary | ICD-10-CM | POA: Diagnosis not present

## 2016-06-12 DIAGNOSIS — G8111 Spastic hemiplegia affecting right dominant side: Secondary | ICD-10-CM

## 2016-06-12 DIAGNOSIS — I639 Cerebral infarction, unspecified: Secondary | ICD-10-CM

## 2016-06-12 DIAGNOSIS — I10 Essential (primary) hypertension: Secondary | ICD-10-CM

## 2016-06-12 NOTE — Patient Instructions (Addendum)
Continue aspirin 325 mg daily  for secondary stroke prevention Maintain strict control of hypertension with blood pressure goal below 130/90, todays  reading 120/76 diabetes with hemoglobin A1c goal below 6.5% , most recent on 02/24/16 6.1  Lipids with LDL cholesterol goal below 70 mg/dL. Healthy diet with plenty of whole grains, cereals, fruits and vegetables,  Exercise regularly  Home exercise program Follow-up in 6 months, if continued stable we will dismiss

## 2016-06-12 NOTE — Progress Notes (Signed)
[facility]  PATIENT: Darrell [name] DOB: 21-Mar-1946   REASON FOR VISIT: Follow-up for stroke, lacunar infarct HISTORY FROM: Patient and wife    HISTORY OF PRESENT ILLNESS:UPDATE 08/18/2017CM [name], 70 year old male returns for follow-up. He has a history of stroke event in December 2016. He is currently on aspirin 325mg .  without stroke or TIA events. Recent hemoglobin A1c at primary care 6.1. His wife checks his blood sugars and he is currently on metformin. Blood pressure is well controlled in the office today at 120/70. He recently moved to [facility] in the  [facility]. He has already been seen by primary care.[name] His rehabilitation therapies have concluded. He was Encouraged to continue his home exercise program . he continues to have AFO right lower leg and weakness of the right hand. He returns for reevaluation  HISTORY 12/13/15 PSMr. Darrell [name] is a 70 year old Caucasian male seen today for first office follow-up visit following recent hospital admission for stroke in December 2016. Darrell [name] is an 70 y.o. 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 At baseline patient has right HP, ambulates with a cane, and can perform the majority of his ADL without assistance. Wife is at the bedside said that the day prior to admission afternoon her husband complained of having trouble walking. She said that they went to a restaurant and he had to be help out of the car, and while eating couldn't use the utensils properly with his left hand. She did not notice slurred speech, facial asymmetry, confusion, and he denies HA, vertigo, double vision, or visual impairment..She stated that he was advised to stop taking aspirin due to frequent skin bruising.   CT head and MRI brain were completed, personally reviewed, and there is evidence of an acute small infarct involving the right centrum semiovale. Date last known well: 10/11/15 Time last known well: uncertain  tPA Given: no, late presentation   patient underwent MRA scan of the brain which showed no large vessel stenosis. Carotid ultrasound showed patent left common carotid to internal carotid bypass graft without significant stenosis. Transthoracic echo showed normal ejection fraction without cardiac source of embolism. Hemoglobin A1c was slightly elevated at 6.9. LDL cholesterol was 40 mg percent. Patient had stopped his aspirin several months ago and it was restarted. He was transferred to inpatient rehabilitation and did well is currently at home. He still getting outpatient physical and occupation therapy. He still feels his left-sided strength is not back to normal and his left leg is heavy]. He uses a cane to walk. He is had no falls. He is tolerating aspirin well without bruising or bleeding. States his blood pressure is usually in the 120s to 130s at home though it is slightly elevated today at 140/88 in office. His primary physician recently increased his blood pressure medication last week. His fasting sugars range in the 100 range. Patient is watching his diet and is quite active. He has overall lost 70 pounds since his initial stroke 4 years ago. Patient was seen by me in 2013 for a perioperative stroke following  elective left carotid surgery which wente wrong. He had developed hematoma following the surgery he required emergency repeat surgery with common carotid to internal carotid artery bypass graft. He had a large  left MCA infarct postprocedure and had spastic right hemi-paresis but was able to walk with a cane.  REVIEW OF SYSTEMS: Full 14 system review of systems performed and notable only for those listed, all others are neg:  Constitutional: neg  Cardiovascular: neg Ear/Nose/Throat:  neg  Skin: neg Eyes: neg Respiratory: neg Gastroitestinal: neg  Hematology/Lymphatic: neg  Endocrine: neg Musculoskeletal:neg Allergy/Immunology: neg Neurological: neg Psychiatric: neg Sleep : neg   ALLERGIES: Allergies  Allergen Reactions  . Oxycodone-Acetaminophen     REACTION: feels sick (patient reports taking acetaminophen without issue)     HOME MEDICATIONS: Outpatient Medications Prior to Visit  Medication Sig Dispense Refill  . aspirin EC 325 MG tablet Take 1 tablet (325 mg total) by mouth daily. 30 tablet 0  . benzonatate (TESSALON) 100 MG capsule Take 1 capsule (100 mg total) by mouth 2 (two) times daily. 30 capsule 0  . citalopram (CELEXA) 10 MG tablet take 1 tablet by mouth once daily 100 tablet 2  . latanoprost (XALATAN) 0.005 % ophthalmic solution Place 1 drop into both eyes at bedtime. 2.5 mL 0  . metFORMIN (GLUCOPHAGE) 1000 MG tablet take 1 tablet with breakfast, one half tablet with your evening meal 200 tablet 3  . Multiple Vitamins-Minerals (RA CENTRAL-VITE) TABS take 1 tablet by mouth once daily 130 tablet 3  . ONE TOUCH ULTRA TEST test strip TEST once daily 100 each PRN  . lisinopril (PRINIVIL,ZESTRIL) 20 MG tablet Take 1 tablet (20 mg total) by mouth 2 (two) times daily. 1 tablet in the morning (Patient not taking: Reported on 06/12/2016) 100 tablet 3  . lisinopril (PRINIVIL,ZESTRIL) 30 MG tablet 1 tablet with your evening meal (Patient not taking: Reported on 06/12/2016) 100 tablet 3   No facility-administered medications prior to visit.     PAST MEDICAL HISTORY: Past Medical History:  Diagnosis Date  . Diabetes mellitus   . History of CVA with residual deficit 10/14/2015  . Hypertension    dr Sherren Mocha  . Poorly controlled type 2 diabetes mellitus with peripheral neuropathy ([place]) 10/14/2015  . Sleep apnea    cpap      sleep study > 4 yrs; no longer wearing CPAP  . Stroke [facility])    rt side weakness, 2013    PAST SURGICAL HISTORY: Past Surgical  History:  Procedure Laterality Date  . ANKLE SURGERY    . CAROTID ENDARTERECTOMY  12/09/11   Left  . COLONOSCOPY    . ENDARTERECTOMY  12/09/2011   Procedure: ENDARTERECTOMY CAROTID;  Surgeon: Tinnie Gens, MD;  Location: [facility] OR;  Service: Vascular;  Laterality: Left;  Left Carotid endarterectomy with dacron patch angioplasty  . HEEL SPUR SURGERY    . heer surgery    . HERNIA REPAIR     x2  . INGUINAL HERNIA REPAIR Left 01/22/2014   Procedure: LEFT INGUINAL HERNIA REPAIR WITH MESH;  Surgeon: [name], MD;  Location: [facility];  Service: General;  Laterality: Left;  . INSERTION OF MESH Left 01/22/2014   Procedure: INSERTION OF MESH;  Surgeon: [name], MD;  Location: [facility];  Service: General;  Laterality: Left;    FAMILY HISTORY: Family History  Problem Relation Age of Onset  . Heart disease Father   . Colon cancer Father     at age 78  . Esophageal cancer Neg Hx   . Rectal cancer Neg Hx   . Stomach cancer Neg Hx     SOCIAL HISTORY: Social History   Social History  .  Marital status: Married    Spouse name: N/A  . Number of children: N/A  . Years of education: N/A   Occupational History  . Not on file.   Social History Main Topics  . Smoking status: Former Smoker    Packs/day: 2.50    Years: 30.00    Types: Cigarettes    Quit date: 12/07/2001  . Smokeless tobacco: Never Used  . Alcohol use No  . Drug use: No  . Sexual activity: Not on file   Other Topics Concern  . Not on file   Social History Narrative  . No narrative on file     PHYSICAL EXAM  Vitals:   06/12/16 1114  BP: 120/76  Pulse: 76  Weight: 199 lb 3.2 oz (90.4 kg)  Height: 5\' 11"  (1.803 m)   Body mass index is 27.78 kg/m. General: well developed, well nourished, seated, in no evident distress Head: head normocephalic and atraumatic.  Neck: supple with no carotid  bruits Cardiovascular: regular rate and rhythm, no murmurs Musculoskeletal: no deformity Skin:  no  rash/petichiae Vascular:  Normal pulses all extremities  Neurological examination  Mental Status: Awake and fully alert. Oriented to place and time. Recent and remote memory intact. Attention span, concentration and fund of knowledge appropriate. Mood and affect appropriate.  Cranial Nerves: Fundoscopic exam reveals sharp disc margins. Pupils equal, briskly reactive to light. Extraocular movements full without nystagmus. Visual fields full to confrontation. Hearing intact. Facial sensation intact. Mild right lower face weakness., tongue, palate moves normally and symmetrically.  Motor: Mild spastic right hemiparesis with 4 out of 5 strength but significant weakness of the right grip and intrinsic hand muscles. Mild weakness of the left grip and diminished fine finger movements on the left. Spasticity of both the right upper and  lower extremity.  Sensory.: intact to touch ,pinprick .position and vibratory sensation.  Coordination: Impaired finger to nose  coordination on the right. Normal on the left.  Gait and Station: Spastic hemiplegic gait with dragging of the right leg with right foot drop.Unable to walk tandem uses a cane to walk Reflexes: 2+ and asymmetric brisker on the right. Toes downgoing.   DIAGNOSTIC DATA (LABS, IMAGING, TESTING) - I reviewed patient records, labs, notes, testing and imaging myself where available.  Lab Results  Component Value Date   WBC 7.9 02/24/2016   HGB 14.9 02/24/2016   HCT 44.3 02/24/2016   MCV 89.0 02/24/2016   PLT 164.0 02/24/2016      Component Value Date/Time   NA 139 02/24/2016 0939   K 4.1 02/24/2016 0939   CL 103 02/24/2016 0939   CO2 30 02/24/2016 0939   GLUCOSE 118 (H) 02/24/2016 0939   GLUCOSE 141 (H) 10/05/2006 0904   BUN 16 02/24/2016 0939   CREATININE 0.70 02/24/2016 0939   CALCIUM 9.0 02/24/2016 0939   PROT 6.3 02/24/2016 0939   ALBUMIN 3.9 02/24/2016 0939   AST 13 02/24/2016 0939   ALT 14 02/24/2016 0939   ALKPHOS 36 (L)  02/24/2016 0939   BILITOT 0.6 02/24/2016 0939   GFRNONAA >60 10/21/2015 0955   GFRAA >60 10/21/2015 0955   Lab Results  Component Value Date   CHOL 134 02/24/2016   HDL 31.50 (L) 02/24/2016   LDLCALC 90 02/24/2016   LDLDIRECT 52.8 11/01/2008   TRIG 59.0 02/24/2016   CHOLHDL 4 02/24/2016   Lab Results  Component Value Date   HGBA1C 6.1 02/24/2016   No results found for: [id number] Lab Results  Component  Value Date   TSH 1.60 02/24/2016      ASSESSMENT AND PLAN 63 year Caucasian male with right subcortical infarct due to lacunar disease in December 2016 with prior large left MCA infarct in 0000000 s/p  complication from left carotid endarterectomy. Vascular risk factors of diabetes and hyperlipidemia and carotid disease   PLAN: Continue aspirin 325 mg daily  for secondary stroke prevention Maintain strict control of hypertension with blood pressure goal below 130/90, todays  reading 120/76 diabetes with hemoglobin A1c goal below 6.5% , most recent on 02/24/16 6.1  Lipids with LDL cholesterol goal below 70 mg/dL. Healthy diet with plenty of whole grains, cereals, fruits and vegetables,  Exercise regularly  Home exercise program Follow-up in 6 months, if continued stable we will dismiss Dennie Bible, [facility], [facility], APRN  [facility] [address], [place], [place] [address] [phone]

## 2016-06-15 ENCOUNTER — Encounter: Payer: Self-pay | Admitting: Emergency Medicine

## 2016-06-16 NOTE — Progress Notes (Signed)
I agree with the above plan 

## 2016-06-23 DIAGNOSIS — Z0000 Encounter for general adult medical examination without abnormal findings: Secondary | ICD-10-CM | POA: Diagnosis not present

## 2016-07-02 DIAGNOSIS — E1159 Type 2 diabetes mellitus with other circulatory complications: Secondary | ICD-10-CM | POA: Diagnosis not present

## 2016-07-02 DIAGNOSIS — I10 Essential (primary) hypertension: Secondary | ICD-10-CM | POA: Diagnosis not present

## 2016-07-02 DIAGNOSIS — I69351 Hemiplegia and hemiparesis following cerebral infarction affecting right dominant side: Secondary | ICD-10-CM | POA: Diagnosis not present

## 2016-07-06 ENCOUNTER — Ambulatory Visit: Payer: Medicare Other | Admitting: Family Medicine

## 2016-07-15 DIAGNOSIS — Z1159 Encounter for screening for other viral diseases: Secondary | ICD-10-CM | POA: Diagnosis not present

## 2016-07-15 DIAGNOSIS — E1159 Type 2 diabetes mellitus with other circulatory complications: Secondary | ICD-10-CM | POA: Diagnosis not present

## 2016-07-15 DIAGNOSIS — I10 Essential (primary) hypertension: Secondary | ICD-10-CM | POA: Diagnosis not present

## 2016-08-21 DIAGNOSIS — E119 Type 2 diabetes mellitus without complications: Secondary | ICD-10-CM | POA: Diagnosis not present

## 2016-08-21 DIAGNOSIS — H40003 Preglaucoma, unspecified, bilateral: Secondary | ICD-10-CM | POA: Diagnosis not present

## 2016-08-31 ENCOUNTER — Other Ambulatory Visit: Payer: Medicare Other

## 2016-09-07 ENCOUNTER — Ambulatory Visit: Payer: Medicare Other | Admitting: Family Medicine

## 2016-10-05 DIAGNOSIS — E1159 Type 2 diabetes mellitus with other circulatory complications: Secondary | ICD-10-CM | POA: Diagnosis not present

## 2016-10-05 DIAGNOSIS — I69351 Hemiplegia and hemiparesis following cerebral infarction affecting right dominant side: Secondary | ICD-10-CM | POA: Diagnosis not present

## 2016-10-05 DIAGNOSIS — I10 Essential (primary) hypertension: Secondary | ICD-10-CM | POA: Diagnosis not present

## 2016-10-05 DIAGNOSIS — F331 Major depressive disorder, recurrent, moderate: Secondary | ICD-10-CM | POA: Diagnosis not present

## 2016-10-14 DIAGNOSIS — I639 Cerebral infarction, unspecified: Secondary | ICD-10-CM | POA: Diagnosis not present

## 2016-10-14 DIAGNOSIS — Z9889 Other specified postprocedural states: Secondary | ICD-10-CM | POA: Diagnosis not present

## 2016-10-14 DIAGNOSIS — I69351 Hemiplegia and hemiparesis following cerebral infarction affecting right dominant side: Secondary | ICD-10-CM | POA: Diagnosis not present

## 2016-10-14 DIAGNOSIS — I6930 Unspecified sequelae of cerebral infarction: Secondary | ICD-10-CM | POA: Diagnosis not present

## 2016-11-21 DIAGNOSIS — E119 Type 2 diabetes mellitus without complications: Secondary | ICD-10-CM | POA: Diagnosis not present

## 2016-11-21 DIAGNOSIS — S8991XA Unspecified injury of right lower leg, initial encounter: Secondary | ICD-10-CM | POA: Diagnosis not present

## 2016-11-21 DIAGNOSIS — M25571 Pain in right ankle and joints of right foot: Secondary | ICD-10-CM | POA: Diagnosis not present

## 2016-11-21 DIAGNOSIS — S99921A Unspecified injury of right foot, initial encounter: Secondary | ICD-10-CM | POA: Diagnosis not present

## 2016-11-21 DIAGNOSIS — I10 Essential (primary) hypertension: Secondary | ICD-10-CM | POA: Diagnosis not present

## 2016-11-21 DIAGNOSIS — S93401A Sprain of unspecified ligament of right ankle, initial encounter: Secondary | ICD-10-CM | POA: Diagnosis not present

## 2016-11-21 DIAGNOSIS — F331 Major depressive disorder, recurrent, moderate: Secondary | ICD-10-CM | POA: Diagnosis not present

## 2016-11-21 DIAGNOSIS — Z87891 Personal history of nicotine dependence: Secondary | ICD-10-CM | POA: Diagnosis not present

## 2016-12-16 ENCOUNTER — Ambulatory Visit: Payer: Medicare Other | Admitting: Nurse Practitioner

## 2017-02-15 DIAGNOSIS — H40003 Preglaucoma, unspecified, bilateral: Secondary | ICD-10-CM | POA: Diagnosis not present

## 2017-04-05 DIAGNOSIS — Z7984 Long term (current) use of oral hypoglycemic drugs: Secondary | ICD-10-CM | POA: Diagnosis not present

## 2017-04-05 DIAGNOSIS — E1159 Type 2 diabetes mellitus with other circulatory complications: Secondary | ICD-10-CM | POA: Diagnosis not present

## 2017-04-05 DIAGNOSIS — F331 Major depressive disorder, recurrent, moderate: Secondary | ICD-10-CM | POA: Diagnosis not present

## 2017-04-05 DIAGNOSIS — I10 Essential (primary) hypertension: Secondary | ICD-10-CM | POA: Diagnosis not present

## 2017-04-05 DIAGNOSIS — I69351 Hemiplegia and hemiparesis following cerebral infarction affecting right dominant side: Secondary | ICD-10-CM | POA: Diagnosis not present

## 2017-04-14 DIAGNOSIS — E785 Hyperlipidemia, unspecified: Secondary | ICD-10-CM | POA: Diagnosis not present

## 2017-04-14 DIAGNOSIS — I69351 Hemiplegia and hemiparesis following cerebral infarction affecting right dominant side: Secondary | ICD-10-CM | POA: Diagnosis not present

## 2017-04-14 DIAGNOSIS — Z8673 Personal history of transient ischemic attack (TIA), and cerebral infarction without residual deficits: Secondary | ICD-10-CM | POA: Diagnosis not present

## 2017-05-12 DIAGNOSIS — L570 Actinic keratosis: Secondary | ICD-10-CM | POA: Diagnosis not present

## 2017-05-12 DIAGNOSIS — Z6828 Body mass index (BMI) 28.0-28.9, adult: Secondary | ICD-10-CM | POA: Diagnosis not present

## 2017-05-17 ENCOUNTER — Other Ambulatory Visit: Payer: Self-pay

## 2017-06-07 DIAGNOSIS — Z23 Encounter for immunization: Secondary | ICD-10-CM | POA: Diagnosis not present

## 2017-07-07 DIAGNOSIS — I10 Essential (primary) hypertension: Secondary | ICD-10-CM | POA: Diagnosis not present

## 2017-07-07 DIAGNOSIS — E119 Type 2 diabetes mellitus without complications: Secondary | ICD-10-CM | POA: Diagnosis not present

## 2017-07-07 DIAGNOSIS — E1159 Type 2 diabetes mellitus with other circulatory complications: Secondary | ICD-10-CM | POA: Diagnosis not present

## 2017-07-07 DIAGNOSIS — F331 Major depressive disorder, recurrent, moderate: Secondary | ICD-10-CM | POA: Diagnosis not present

## 2017-07-07 DIAGNOSIS — Z1389 Encounter for screening for other disorder: Secondary | ICD-10-CM | POA: Diagnosis not present

## 2017-07-07 DIAGNOSIS — I69351 Hemiplegia and hemiparesis following cerebral infarction affecting right dominant side: Secondary | ICD-10-CM | POA: Diagnosis not present

## 2017-08-19 DIAGNOSIS — L570 Actinic keratosis: Secondary | ICD-10-CM | POA: Diagnosis not present

## 2017-08-19 DIAGNOSIS — Z08 Encounter for follow-up examination after completed treatment for malignant neoplasm: Secondary | ICD-10-CM | POA: Diagnosis not present

## 2017-08-19 DIAGNOSIS — Z1283 Encounter for screening for malignant neoplasm of skin: Secondary | ICD-10-CM | POA: Diagnosis not present

## 2017-08-19 DIAGNOSIS — D2261 Melanocytic nevi of right upper limb, including shoulder: Secondary | ICD-10-CM | POA: Diagnosis not present

## 2017-09-23 DIAGNOSIS — H40003 Preglaucoma, unspecified, bilateral: Secondary | ICD-10-CM | POA: Diagnosis not present

## 2017-09-23 DIAGNOSIS — H43813 Vitreous degeneration, bilateral: Secondary | ICD-10-CM | POA: Diagnosis not present

## 2017-09-23 DIAGNOSIS — H5213 Myopia, bilateral: Secondary | ICD-10-CM | POA: Diagnosis not present

## 2017-09-23 DIAGNOSIS — E119 Type 2 diabetes mellitus without complications: Secondary | ICD-10-CM | POA: Diagnosis not present

## 2017-09-23 DIAGNOSIS — H2513 Age-related nuclear cataract, bilateral: Secondary | ICD-10-CM | POA: Diagnosis not present

## 2017-09-23 DIAGNOSIS — H52223 Regular astigmatism, bilateral: Secondary | ICD-10-CM | POA: Diagnosis not present

## 2017-09-23 DIAGNOSIS — H524 Presbyopia: Secondary | ICD-10-CM | POA: Diagnosis not present

## 2017-10-11 DIAGNOSIS — I10 Essential (primary) hypertension: Secondary | ICD-10-CM | POA: Diagnosis not present

## 2017-10-11 DIAGNOSIS — E1159 Type 2 diabetes mellitus with other circulatory complications: Secondary | ICD-10-CM | POA: Diagnosis not present

## 2017-10-11 DIAGNOSIS — I69351 Hemiplegia and hemiparesis following cerebral infarction affecting right dominant side: Secondary | ICD-10-CM | POA: Diagnosis not present

## 2017-10-11 DIAGNOSIS — F331 Major depressive disorder, recurrent, moderate: Secondary | ICD-10-CM | POA: Diagnosis not present

## 2021-02-23 DEATH — deceased
